# Patient Record
Sex: Female | Born: 1950 | Race: Black or African American | Hispanic: No | Marital: Married | Smoking: Former smoker
Health system: Southern US, Community
[De-identification: ages and names within clinical notes are randomized; demographics above are authoritative.]

## PROBLEM LIST (undated history)

## (undated) DIAGNOSIS — J189 Pneumonia, unspecified organism: Secondary | ICD-10-CM

## (undated) DIAGNOSIS — I209 Angina pectoris, unspecified: Secondary | ICD-10-CM

## (undated) DIAGNOSIS — K831 Obstruction of bile duct: Secondary | ICD-10-CM

## (undated) DIAGNOSIS — R0602 Shortness of breath: Secondary | ICD-10-CM

## (undated) DIAGNOSIS — F32A Depression, unspecified: Secondary | ICD-10-CM

## (undated) DIAGNOSIS — M75100 Unspecified rotator cuff tear or rupture of unspecified shoulder, not specified as traumatic: Secondary | ICD-10-CM

## (undated) DIAGNOSIS — D649 Anemia, unspecified: Secondary | ICD-10-CM

## (undated) DIAGNOSIS — R197 Diarrhea, unspecified: Secondary | ICD-10-CM

## (undated) DIAGNOSIS — I739 Peripheral vascular disease, unspecified: Secondary | ICD-10-CM

## (undated) DIAGNOSIS — M1990 Unspecified osteoarthritis, unspecified site: Secondary | ICD-10-CM

## (undated) DIAGNOSIS — A0472 Enterocolitis due to Clostridium difficile, not specified as recurrent: Secondary | ICD-10-CM

## (undated) DIAGNOSIS — E46 Unspecified protein-calorie malnutrition: Secondary | ICD-10-CM

## (undated) DIAGNOSIS — T4145XA Adverse effect of unspecified anesthetic, initial encounter: Secondary | ICD-10-CM

## (undated) DIAGNOSIS — I10 Essential (primary) hypertension: Secondary | ICD-10-CM

## (undated) DIAGNOSIS — I219 Acute myocardial infarction, unspecified: Secondary | ICD-10-CM

## (undated) DIAGNOSIS — E785 Hyperlipidemia, unspecified: Secondary | ICD-10-CM

## (undated) DIAGNOSIS — R110 Nausea: Secondary | ICD-10-CM

## (undated) DIAGNOSIS — C801 Malignant (primary) neoplasm, unspecified: Secondary | ICD-10-CM

## (undated) DIAGNOSIS — K449 Diaphragmatic hernia without obstruction or gangrene: Secondary | ICD-10-CM

## (undated) DIAGNOSIS — C221 Intrahepatic bile duct carcinoma: Secondary | ICD-10-CM

## (undated) DIAGNOSIS — K222 Esophageal obstruction: Secondary | ICD-10-CM

## (undated) DIAGNOSIS — T8859XA Other complications of anesthesia, initial encounter: Secondary | ICD-10-CM

## (undated) DIAGNOSIS — I499 Cardiac arrhythmia, unspecified: Secondary | ICD-10-CM

## (undated) DIAGNOSIS — C7800 Secondary malignant neoplasm of unspecified lung: Secondary | ICD-10-CM

## (undated) DIAGNOSIS — K219 Gastro-esophageal reflux disease without esophagitis: Secondary | ICD-10-CM

## (undated) DIAGNOSIS — F329 Major depressive disorder, single episode, unspecified: Secondary | ICD-10-CM

## (undated) DIAGNOSIS — K750 Abscess of liver: Secondary | ICD-10-CM

## (undated) DIAGNOSIS — D638 Anemia in other chronic diseases classified elsewhere: Secondary | ICD-10-CM

## (undated) DIAGNOSIS — R51 Headache: Secondary | ICD-10-CM

## (undated) DIAGNOSIS — C240 Malignant neoplasm of extrahepatic bile duct: Secondary | ICD-10-CM

## (undated) HISTORY — PX: LIVER RESECTION: SHX1977

## (undated) HISTORY — PX: ARTHROSCOPY KNEE W/ DRILLING: SUR92

## (undated) HISTORY — DX: Gastro-esophageal reflux disease without esophagitis: K21.9

## (undated) HISTORY — DX: Intrahepatic bile duct carcinoma: C22.1

## (undated) HISTORY — DX: Obstruction of bile duct: K83.1

## (undated) HISTORY — DX: Malignant (primary) neoplasm, unspecified: C80.1

## (undated) HISTORY — DX: Major depressive disorder, single episode, unspecified: F32.9

## (undated) HISTORY — DX: Depression, unspecified: F32.A

## (undated) HISTORY — DX: Unspecified rotator cuff tear or rupture of unspecified shoulder, not specified as traumatic: M75.100

## (undated) HISTORY — DX: Hyperlipidemia, unspecified: E78.5

## (undated) HISTORY — DX: Esophageal obstruction: K22.2

## (undated) HISTORY — DX: Nausea: R11.0

## (undated) HISTORY — PX: OTHER SURGICAL HISTORY: SHX169

## (undated) HISTORY — DX: Diarrhea, unspecified: R19.7

---

## 1998-01-12 ENCOUNTER — Encounter: Admission: RE | Admit: 1998-01-12 | Discharge: 1998-04-12 | Payer: Self-pay | Admitting: Internal Medicine

## 1998-02-28 ENCOUNTER — Emergency Department (HOSPITAL_COMMUNITY): Admission: EM | Admit: 1998-02-28 | Discharge: 1998-02-28 | Payer: Self-pay | Admitting: Emergency Medicine

## 1998-03-02 ENCOUNTER — Emergency Department (HOSPITAL_COMMUNITY): Admission: EM | Admit: 1998-03-02 | Discharge: 1998-03-02 | Payer: Self-pay | Admitting: Internal Medicine

## 1998-06-07 ENCOUNTER — Ambulatory Visit (HOSPITAL_BASED_OUTPATIENT_CLINIC_OR_DEPARTMENT_OTHER): Admission: RE | Admit: 1998-06-07 | Discharge: 1998-06-07 | Payer: Self-pay | Admitting: Orthopedic Surgery

## 1999-05-16 ENCOUNTER — Ambulatory Visit (HOSPITAL_BASED_OUTPATIENT_CLINIC_OR_DEPARTMENT_OTHER): Admission: RE | Admit: 1999-05-16 | Discharge: 1999-05-16 | Payer: Self-pay | Admitting: Orthopedic Surgery

## 2000-02-14 ENCOUNTER — Encounter: Admission: RE | Admit: 2000-02-14 | Discharge: 2000-02-14 | Payer: Self-pay | Admitting: Hematology and Oncology

## 2000-03-12 ENCOUNTER — Ambulatory Visit (HOSPITAL_BASED_OUTPATIENT_CLINIC_OR_DEPARTMENT_OTHER): Admission: RE | Admit: 2000-03-12 | Discharge: 2000-03-12 | Payer: Self-pay | Admitting: Orthopedic Surgery

## 2000-04-17 ENCOUNTER — Ambulatory Visit: Admission: RE | Admit: 2000-04-17 | Discharge: 2000-04-17 | Payer: Self-pay | Admitting: Orthopedic Surgery

## 2000-05-17 ENCOUNTER — Encounter: Payer: Self-pay | Admitting: Emergency Medicine

## 2000-05-18 ENCOUNTER — Inpatient Hospital Stay (HOSPITAL_COMMUNITY): Admission: EM | Admit: 2000-05-18 | Discharge: 2000-05-20 | Payer: Self-pay | Admitting: *Deleted

## 2000-05-18 ENCOUNTER — Encounter: Payer: Self-pay | Admitting: Emergency Medicine

## 2000-05-19 ENCOUNTER — Encounter: Payer: Self-pay | Admitting: Internal Medicine

## 2000-12-23 ENCOUNTER — Encounter: Admission: RE | Admit: 2000-12-23 | Discharge: 2000-12-23 | Payer: Self-pay | Admitting: Hematology and Oncology

## 2001-01-15 ENCOUNTER — Encounter: Admission: RE | Admit: 2001-01-15 | Discharge: 2001-01-15 | Payer: Self-pay | Admitting: Internal Medicine

## 2001-01-18 ENCOUNTER — Ambulatory Visit (HOSPITAL_COMMUNITY): Admission: RE | Admit: 2001-01-18 | Discharge: 2001-01-18 | Payer: Self-pay | Admitting: *Deleted

## 2001-03-02 ENCOUNTER — Encounter: Admission: RE | Admit: 2001-03-02 | Discharge: 2001-03-02 | Payer: Self-pay | Admitting: *Deleted

## 2001-03-24 ENCOUNTER — Encounter (INDEPENDENT_AMBULATORY_CARE_PROVIDER_SITE_OTHER): Payer: Self-pay | Admitting: *Deleted

## 2001-03-24 ENCOUNTER — Encounter: Admission: RE | Admit: 2001-03-24 | Discharge: 2001-03-24 | Payer: Self-pay | Admitting: Internal Medicine

## 2001-06-04 ENCOUNTER — Ambulatory Visit (HOSPITAL_COMMUNITY): Admission: RE | Admit: 2001-06-04 | Discharge: 2001-06-04 | Payer: Self-pay | Admitting: Family Medicine

## 2001-06-04 ENCOUNTER — Encounter: Payer: Self-pay | Admitting: Family Medicine

## 2001-08-12 DIAGNOSIS — K222 Esophageal obstruction: Secondary | ICD-10-CM

## 2001-08-12 HISTORY — DX: Esophageal obstruction: K22.2

## 2001-09-23 ENCOUNTER — Ambulatory Visit (HOSPITAL_COMMUNITY): Admission: RE | Admit: 2001-09-23 | Discharge: 2001-09-23 | Payer: Self-pay | Admitting: Family Medicine

## 2001-12-29 ENCOUNTER — Inpatient Hospital Stay (HOSPITAL_COMMUNITY): Admission: EM | Admit: 2001-12-29 | Discharge: 2002-01-01 | Payer: Self-pay | Admitting: *Deleted

## 2001-12-29 ENCOUNTER — Encounter: Payer: Self-pay | Admitting: *Deleted

## 2001-12-31 ENCOUNTER — Encounter: Payer: Self-pay | Admitting: Cardiology

## 2002-05-21 ENCOUNTER — Encounter: Payer: Self-pay | Admitting: *Deleted

## 2002-05-21 ENCOUNTER — Inpatient Hospital Stay (HOSPITAL_COMMUNITY): Admission: EM | Admit: 2002-05-21 | Discharge: 2002-05-22 | Payer: Self-pay | Admitting: Emergency Medicine

## 2002-07-22 ENCOUNTER — Encounter: Payer: Self-pay | Admitting: Internal Medicine

## 2002-08-02 ENCOUNTER — Encounter: Payer: Self-pay | Admitting: Internal Medicine

## 2002-08-02 ENCOUNTER — Ambulatory Visit (HOSPITAL_COMMUNITY): Admission: RE | Admit: 2002-08-02 | Discharge: 2002-08-02 | Payer: Self-pay | Admitting: Internal Medicine

## 2002-10-05 ENCOUNTER — Ambulatory Visit (HOSPITAL_COMMUNITY): Admission: RE | Admit: 2002-10-05 | Discharge: 2002-10-05 | Payer: Self-pay | Admitting: Family Medicine

## 2003-05-27 ENCOUNTER — Encounter: Payer: Self-pay | Admitting: Internal Medicine

## 2003-05-27 ENCOUNTER — Encounter (INDEPENDENT_AMBULATORY_CARE_PROVIDER_SITE_OTHER): Payer: Self-pay | Admitting: *Deleted

## 2003-05-27 ENCOUNTER — Ambulatory Visit (HOSPITAL_COMMUNITY): Admission: RE | Admit: 2003-05-27 | Discharge: 2003-05-27 | Payer: Self-pay | Admitting: Internal Medicine

## 2003-06-21 ENCOUNTER — Encounter (INDEPENDENT_AMBULATORY_CARE_PROVIDER_SITE_OTHER): Payer: Self-pay

## 2003-06-21 ENCOUNTER — Observation Stay (HOSPITAL_COMMUNITY): Admission: RE | Admit: 2003-06-21 | Discharge: 2003-06-22 | Payer: Self-pay

## 2003-06-21 ENCOUNTER — Encounter (INDEPENDENT_AMBULATORY_CARE_PROVIDER_SITE_OTHER): Payer: Self-pay | Admitting: *Deleted

## 2003-07-30 ENCOUNTER — Encounter: Admission: RE | Admit: 2003-07-30 | Discharge: 2003-07-30 | Payer: Self-pay | Admitting: Orthopedic Surgery

## 2003-11-22 ENCOUNTER — Encounter: Admission: RE | Admit: 2003-11-22 | Discharge: 2003-11-22 | Payer: Self-pay | Admitting: Family Medicine

## 2004-03-23 ENCOUNTER — Emergency Department (HOSPITAL_COMMUNITY): Admission: EM | Admit: 2004-03-23 | Discharge: 2004-03-24 | Payer: Self-pay | Admitting: Emergency Medicine

## 2004-03-24 ENCOUNTER — Encounter (INDEPENDENT_AMBULATORY_CARE_PROVIDER_SITE_OTHER): Payer: Self-pay | Admitting: *Deleted

## 2004-03-27 ENCOUNTER — Ambulatory Visit (HOSPITAL_COMMUNITY): Admission: RE | Admit: 2004-03-27 | Discharge: 2004-03-27 | Payer: Self-pay | Admitting: Nurse Practitioner

## 2004-04-19 ENCOUNTER — Ambulatory Visit: Payer: Self-pay | Admitting: Internal Medicine

## 2004-04-19 ENCOUNTER — Ambulatory Visit: Payer: Self-pay | Admitting: *Deleted

## 2004-05-09 ENCOUNTER — Ambulatory Visit: Payer: Self-pay | Admitting: Nurse Practitioner

## 2004-07-11 ENCOUNTER — Ambulatory Visit: Payer: Self-pay | Admitting: Internal Medicine

## 2004-08-09 ENCOUNTER — Ambulatory Visit: Payer: Self-pay | Admitting: Nurse Practitioner

## 2004-08-09 ENCOUNTER — Emergency Department (HOSPITAL_COMMUNITY): Admission: EM | Admit: 2004-08-09 | Discharge: 2004-08-09 | Payer: Self-pay | Admitting: Emergency Medicine

## 2004-08-09 ENCOUNTER — Encounter (INDEPENDENT_AMBULATORY_CARE_PROVIDER_SITE_OTHER): Payer: Self-pay | Admitting: *Deleted

## 2004-08-16 ENCOUNTER — Ambulatory Visit: Payer: Self-pay | Admitting: Nurse Practitioner

## 2004-11-22 ENCOUNTER — Encounter: Admission: RE | Admit: 2004-11-22 | Discharge: 2004-11-22 | Payer: Self-pay | Admitting: Family Medicine

## 2005-03-19 ENCOUNTER — Ambulatory Visit: Payer: Self-pay | Admitting: Nurse Practitioner

## 2005-03-19 ENCOUNTER — Inpatient Hospital Stay (HOSPITAL_COMMUNITY): Admission: EM | Admit: 2005-03-19 | Discharge: 2005-03-20 | Payer: Self-pay | Admitting: Emergency Medicine

## 2005-03-20 ENCOUNTER — Ambulatory Visit: Payer: Self-pay | Admitting: Internal Medicine

## 2005-04-02 ENCOUNTER — Ambulatory Visit: Payer: Self-pay | Admitting: Internal Medicine

## 2005-04-12 ENCOUNTER — Ambulatory Visit: Payer: Self-pay | Admitting: Internal Medicine

## 2005-04-12 ENCOUNTER — Ambulatory Visit (HOSPITAL_COMMUNITY): Admission: RE | Admit: 2005-04-12 | Discharge: 2005-04-12 | Payer: Self-pay | Admitting: Internal Medicine

## 2005-05-09 ENCOUNTER — Ambulatory Visit: Payer: Self-pay | Admitting: Nurse Practitioner

## 2005-05-23 ENCOUNTER — Ambulatory Visit: Payer: Self-pay | Admitting: Nurse Practitioner

## 2005-05-25 ENCOUNTER — Ambulatory Visit (HOSPITAL_COMMUNITY): Admission: RE | Admit: 2005-05-25 | Discharge: 2005-05-25 | Payer: Self-pay | Admitting: Internal Medicine

## 2005-05-25 ENCOUNTER — Encounter (INDEPENDENT_AMBULATORY_CARE_PROVIDER_SITE_OTHER): Payer: Self-pay | Admitting: *Deleted

## 2005-06-13 ENCOUNTER — Ambulatory Visit: Payer: Self-pay | Admitting: Nurse Practitioner

## 2005-07-02 ENCOUNTER — Ambulatory Visit: Payer: Self-pay | Admitting: Internal Medicine

## 2005-07-18 ENCOUNTER — Ambulatory Visit: Payer: Self-pay | Admitting: Internal Medicine

## 2005-07-18 LAB — HM COLONOSCOPY

## 2005-08-12 HISTORY — PX: CHOLECYSTECTOMY: SHX55

## 2005-10-01 ENCOUNTER — Ambulatory Visit: Payer: Self-pay | Admitting: Internal Medicine

## 2005-10-03 ENCOUNTER — Encounter (INDEPENDENT_AMBULATORY_CARE_PROVIDER_SITE_OTHER): Payer: Self-pay | Admitting: *Deleted

## 2005-10-03 ENCOUNTER — Ambulatory Visit (HOSPITAL_COMMUNITY): Admission: RE | Admit: 2005-10-03 | Discharge: 2005-10-03 | Payer: Self-pay | Admitting: Internal Medicine

## 2005-10-03 ENCOUNTER — Ambulatory Visit: Payer: Self-pay | Admitting: Internal Medicine

## 2005-10-03 ENCOUNTER — Encounter (INDEPENDENT_AMBULATORY_CARE_PROVIDER_SITE_OTHER): Payer: Self-pay | Admitting: Pulmonary Disease

## 2005-10-04 ENCOUNTER — Ambulatory Visit (HOSPITAL_COMMUNITY): Admission: RE | Admit: 2005-10-04 | Discharge: 2005-10-04 | Payer: Self-pay | Admitting: Family Medicine

## 2005-10-16 ENCOUNTER — Ambulatory Visit: Payer: Self-pay | Admitting: Internal Medicine

## 2005-10-16 ENCOUNTER — Ambulatory Visit (HOSPITAL_COMMUNITY): Admission: RE | Admit: 2005-10-16 | Discharge: 2005-10-16 | Payer: Self-pay | Admitting: Internal Medicine

## 2005-10-17 ENCOUNTER — Ambulatory Visit: Payer: Self-pay | Admitting: Internal Medicine

## 2005-11-18 ENCOUNTER — Ambulatory Visit: Payer: Self-pay | Admitting: Hospitalist

## 2006-02-13 ENCOUNTER — Ambulatory Visit (HOSPITAL_COMMUNITY): Admission: RE | Admit: 2006-02-13 | Discharge: 2006-02-13 | Payer: Self-pay | Admitting: Obstetrics and Gynecology

## 2006-03-31 ENCOUNTER — Ambulatory Visit: Payer: Self-pay | Admitting: Hospitalist

## 2006-04-09 ENCOUNTER — Encounter: Admission: RE | Admit: 2006-04-09 | Discharge: 2006-05-07 | Payer: Self-pay | Admitting: Hospitalist

## 2006-05-21 ENCOUNTER — Ambulatory Visit: Payer: Self-pay | Admitting: Internal Medicine

## 2006-05-21 ENCOUNTER — Encounter (INDEPENDENT_AMBULATORY_CARE_PROVIDER_SITE_OTHER): Payer: Self-pay | Admitting: Pulmonary Disease

## 2006-05-21 LAB — CONVERTED CEMR LAB
ALT: 33 units/L (ref 0–40)
Albumin: 4.3 g/dL (ref 3.5–5.2)
Alkaline Phosphatase: 77 units/L (ref 39–117)
BUN: 14 mg/dL (ref 6–23)
CO2: 24 meq/L (ref 19–32)
Calcium: 9.4 mg/dL (ref 8.4–10.5)
Chloride: 106 meq/L (ref 96–112)
Creatinine, Ser: 1.01 mg/dL (ref 0.40–1.20)
Glucose, Bld: 105 mg/dL — ABNORMAL HIGH (ref 70–99)
Potassium: 4.3 meq/L (ref 3.5–5.3)
Sodium: 143 meq/L (ref 135–145)
Total Bilirubin: 0.2 mg/dL — ABNORMAL LOW (ref 0.3–1.2)

## 2006-05-26 ENCOUNTER — Encounter (INDEPENDENT_AMBULATORY_CARE_PROVIDER_SITE_OTHER): Payer: Self-pay | Admitting: Pulmonary Disease

## 2006-05-26 DIAGNOSIS — K5730 Diverticulosis of large intestine without perforation or abscess without bleeding: Secondary | ICD-10-CM | POA: Insufficient documentation

## 2006-05-26 DIAGNOSIS — N83209 Unspecified ovarian cyst, unspecified side: Secondary | ICD-10-CM

## 2006-05-26 DIAGNOSIS — K219 Gastro-esophageal reflux disease without esophagitis: Secondary | ICD-10-CM

## 2006-05-26 DIAGNOSIS — K648 Other hemorrhoids: Secondary | ICD-10-CM | POA: Insufficient documentation

## 2006-05-26 DIAGNOSIS — K449 Diaphragmatic hernia without obstruction or gangrene: Secondary | ICD-10-CM | POA: Insufficient documentation

## 2006-05-26 DIAGNOSIS — E785 Hyperlipidemia, unspecified: Secondary | ICD-10-CM

## 2006-05-26 DIAGNOSIS — I10 Essential (primary) hypertension: Secondary | ICD-10-CM | POA: Insufficient documentation

## 2006-05-26 DIAGNOSIS — K222 Esophageal obstruction: Secondary | ICD-10-CM | POA: Insufficient documentation

## 2006-05-26 DIAGNOSIS — R1084 Generalized abdominal pain: Secondary | ICD-10-CM | POA: Insufficient documentation

## 2006-06-04 ENCOUNTER — Ambulatory Visit: Payer: Self-pay | Admitting: Internal Medicine

## 2006-06-04 ENCOUNTER — Encounter (INDEPENDENT_AMBULATORY_CARE_PROVIDER_SITE_OTHER): Payer: Self-pay | Admitting: Pulmonary Disease

## 2006-06-04 LAB — CONVERTED CEMR LAB
ALT: 33 units/L (ref 0–40)
AST: 28 units/L (ref 0–37)
Albumin: 4.1 g/dL (ref 3.5–5.2)
Alkaline Phosphatase: 69 units/L (ref 39–117)
BUN: 12 mg/dL (ref 6–23)
CO2: 25 meq/L (ref 19–32)
Calcium: 9.5 mg/dL (ref 8.4–10.5)
Chloride: 102 meq/L (ref 96–112)
Creatinine, Ser: 0.87 mg/dL (ref 0.40–1.20)
Glucose, Bld: 110 mg/dL — ABNORMAL HIGH (ref 70–99)
HCT: 40.9 % (ref 36.0–46.0)
Hemoglobin: 13.3 g/dL (ref 12.0–15.0)
Leukocyte count, blood: 4.3 10*9/L (ref 4.0–10.5)
MCHC: 32.5 g/dL (ref 30.0–36.0)
MCV: 86.8 fL (ref 78.0–100.0)
Platelets: 249 10*3/uL (ref 150–400)
Potassium: 3.6 meq/L (ref 3.5–5.3)
RBC: 4.71 M/uL (ref 3.87–5.11)
RDW: 13.5 % (ref 11.5–14.0)
Sodium: 139 meq/L (ref 135–145)
TSH: 1.491 microintl units/mL (ref 0.350–5.50)
Total Bilirubin: 0.3 mg/dL (ref 0.3–1.2)
Total CK: 46 units/L (ref 7–177)
Total Protein: 6.8 g/dL (ref 6.0–8.3)

## 2006-06-11 ENCOUNTER — Ambulatory Visit: Payer: Self-pay | Admitting: Internal Medicine

## 2006-06-11 ENCOUNTER — Encounter: Admission: RE | Admit: 2006-06-11 | Discharge: 2006-06-11 | Payer: Self-pay | Admitting: Internal Medicine

## 2006-07-08 ENCOUNTER — Ambulatory Visit: Payer: Self-pay | Admitting: Cardiology

## 2006-07-12 DIAGNOSIS — I7389 Other specified peripheral vascular diseases: Secondary | ICD-10-CM

## 2006-07-17 ENCOUNTER — Ambulatory Visit: Payer: Self-pay

## 2006-09-08 ENCOUNTER — Ambulatory Visit: Payer: Self-pay | Admitting: Hospitalist

## 2006-09-08 ENCOUNTER — Ambulatory Visit: Payer: Self-pay | Admitting: Cardiovascular Disease

## 2006-09-08 DIAGNOSIS — F329 Major depressive disorder, single episode, unspecified: Secondary | ICD-10-CM

## 2006-09-08 DIAGNOSIS — M129 Arthropathy, unspecified: Secondary | ICD-10-CM | POA: Insufficient documentation

## 2006-09-12 ENCOUNTER — Telehealth (INDEPENDENT_AMBULATORY_CARE_PROVIDER_SITE_OTHER): Payer: Self-pay | Admitting: *Deleted

## 2006-12-19 ENCOUNTER — Ambulatory Visit (HOSPITAL_COMMUNITY): Admission: RE | Admit: 2006-12-19 | Discharge: 2006-12-19 | Payer: Self-pay | Admitting: Internal Medicine

## 2006-12-19 ENCOUNTER — Ambulatory Visit: Payer: Self-pay | Admitting: *Deleted

## 2006-12-19 DIAGNOSIS — M79609 Pain in unspecified limb: Secondary | ICD-10-CM | POA: Insufficient documentation

## 2006-12-26 ENCOUNTER — Ambulatory Visit (HOSPITAL_COMMUNITY): Admission: RE | Admit: 2006-12-26 | Discharge: 2006-12-26 | Payer: Self-pay | Admitting: Hospitalist

## 2006-12-26 ENCOUNTER — Ambulatory Visit: Payer: Self-pay | Admitting: Hospitalist

## 2007-01-09 ENCOUNTER — Encounter (INDEPENDENT_AMBULATORY_CARE_PROVIDER_SITE_OTHER): Payer: Self-pay | Admitting: Pulmonary Disease

## 2007-01-09 ENCOUNTER — Ambulatory Visit: Payer: Self-pay | Admitting: Internal Medicine

## 2007-01-09 LAB — CONVERTED CEMR LAB
ALT: 21 units/L (ref 0–35)
AST: 20 units/L (ref 0–37)
Albumin: 4.1 g/dL (ref 3.5–5.2)
Alkaline Phosphatase: 73 units/L (ref 39–117)
CO2: 25 meq/L (ref 19–32)
Calcium: 9.6 mg/dL (ref 8.4–10.5)
Platelets: 314 10*3/uL (ref 150–400)
Total CK: 53 units/L (ref 7–177)
Total Protein: 7 g/dL (ref 6.0–8.3)
WBC: 6.1 10*3/uL (ref 4.0–10.5)

## 2007-01-16 ENCOUNTER — Ambulatory Visit (HOSPITAL_COMMUNITY): Admission: RE | Admit: 2007-01-16 | Discharge: 2007-01-16 | Payer: Self-pay | Admitting: Pulmonary Disease

## 2007-01-23 ENCOUNTER — Ambulatory Visit (HOSPITAL_COMMUNITY): Admission: RE | Admit: 2007-01-23 | Discharge: 2007-01-23 | Payer: Self-pay | Admitting: Internal Medicine

## 2007-01-23 ENCOUNTER — Ambulatory Visit: Payer: Self-pay | Admitting: Internal Medicine

## 2007-02-18 ENCOUNTER — Encounter
Admission: RE | Admit: 2007-02-18 | Discharge: 2007-02-20 | Payer: Self-pay | Admitting: Physical Medicine & Rehabilitation

## 2007-02-20 ENCOUNTER — Ambulatory Visit: Payer: Self-pay | Admitting: Physical Medicine & Rehabilitation

## 2007-02-23 ENCOUNTER — Encounter (INDEPENDENT_AMBULATORY_CARE_PROVIDER_SITE_OTHER): Payer: Self-pay | Admitting: *Deleted

## 2007-03-02 ENCOUNTER — Encounter (INDEPENDENT_AMBULATORY_CARE_PROVIDER_SITE_OTHER): Payer: Self-pay | Admitting: *Deleted

## 2007-03-26 ENCOUNTER — Ambulatory Visit: Payer: Self-pay | Admitting: Cardiovascular Disease

## 2007-03-26 ENCOUNTER — Encounter (INDEPENDENT_AMBULATORY_CARE_PROVIDER_SITE_OTHER): Payer: Self-pay | Admitting: *Deleted

## 2007-04-12 ENCOUNTER — Encounter (INDEPENDENT_AMBULATORY_CARE_PROVIDER_SITE_OTHER): Payer: Self-pay | Admitting: *Deleted

## 2007-04-13 HISTORY — PX: OTHER SURGICAL HISTORY: SHX169

## 2007-04-24 ENCOUNTER — Ambulatory Visit: Payer: Self-pay | Admitting: Cardiovascular Disease

## 2007-04-24 ENCOUNTER — Ambulatory Visit (HOSPITAL_COMMUNITY): Admission: RE | Admit: 2007-04-24 | Discharge: 2007-04-24 | Payer: Self-pay | Admitting: Cardiovascular Disease

## 2007-05-28 ENCOUNTER — Telehealth (INDEPENDENT_AMBULATORY_CARE_PROVIDER_SITE_OTHER): Payer: Self-pay | Admitting: *Deleted

## 2007-06-01 ENCOUNTER — Observation Stay (HOSPITAL_COMMUNITY): Admission: EM | Admit: 2007-06-01 | Discharge: 2007-06-04 | Payer: Self-pay | Admitting: Emergency Medicine

## 2007-06-01 ENCOUNTER — Ambulatory Visit: Payer: Self-pay | Admitting: Internal Medicine

## 2007-06-16 ENCOUNTER — Ambulatory Visit: Payer: Self-pay | Admitting: Hospitalist

## 2007-06-16 ENCOUNTER — Encounter (INDEPENDENT_AMBULATORY_CARE_PROVIDER_SITE_OTHER): Payer: Self-pay | Admitting: Internal Medicine

## 2007-06-16 DIAGNOSIS — M542 Cervicalgia: Secondary | ICD-10-CM

## 2007-06-16 LAB — CONVERTED CEMR LAB
Potassium: 3.9 meq/L (ref 3.5–5.3)
Sodium: 140 meq/L (ref 135–145)

## 2007-06-18 ENCOUNTER — Encounter: Admission: RE | Admit: 2007-06-18 | Discharge: 2007-08-04 | Payer: Self-pay | Admitting: Internal Medicine

## 2007-06-25 ENCOUNTER — Encounter: Admission: RE | Admit: 2007-06-25 | Discharge: 2007-08-12 | Payer: Self-pay | Admitting: Pediatrics

## 2007-07-22 ENCOUNTER — Ambulatory Visit: Payer: Self-pay | Admitting: Cardiovascular Disease

## 2007-07-22 ENCOUNTER — Ambulatory Visit: Payer: Self-pay

## 2007-08-04 ENCOUNTER — Ambulatory Visit: Payer: Self-pay | Admitting: Vascular Surgery

## 2007-08-13 HISTORY — PX: BYPASS GRAFT: SHX909

## 2007-08-20 ENCOUNTER — Inpatient Hospital Stay (HOSPITAL_COMMUNITY): Admission: RE | Admit: 2007-08-20 | Discharge: 2007-08-25 | Payer: Self-pay | Admitting: Vascular Surgery

## 2007-08-20 ENCOUNTER — Ambulatory Visit: Payer: Self-pay | Admitting: Vascular Surgery

## 2007-08-21 ENCOUNTER — Encounter: Payer: Self-pay | Admitting: Vascular Surgery

## 2007-09-01 ENCOUNTER — Inpatient Hospital Stay (HOSPITAL_COMMUNITY): Admission: AD | Admit: 2007-09-01 | Discharge: 2007-09-08 | Payer: Self-pay | Admitting: Vascular Surgery

## 2007-09-01 ENCOUNTER — Ambulatory Visit: Payer: Self-pay | Admitting: Vascular Surgery

## 2007-09-15 ENCOUNTER — Ambulatory Visit: Payer: Self-pay | Admitting: Vascular Surgery

## 2007-11-12 ENCOUNTER — Encounter (INDEPENDENT_AMBULATORY_CARE_PROVIDER_SITE_OTHER): Payer: Self-pay | Admitting: *Deleted

## 2007-11-12 ENCOUNTER — Ambulatory Visit: Payer: Self-pay | Admitting: Hospitalist

## 2007-11-12 DIAGNOSIS — D4959 Neoplasm of unspecified behavior of other genitourinary organ: Secondary | ICD-10-CM

## 2007-11-17 ENCOUNTER — Telehealth: Payer: Self-pay | Admitting: *Deleted

## 2008-04-15 ENCOUNTER — Telehealth (INDEPENDENT_AMBULATORY_CARE_PROVIDER_SITE_OTHER): Payer: Self-pay | Admitting: *Deleted

## 2008-05-10 ENCOUNTER — Ambulatory Visit: Payer: Self-pay | Admitting: Vascular Surgery

## 2008-06-20 ENCOUNTER — Telehealth (INDEPENDENT_AMBULATORY_CARE_PROVIDER_SITE_OTHER): Payer: Self-pay | Admitting: *Deleted

## 2008-08-12 DIAGNOSIS — M75100 Unspecified rotator cuff tear or rupture of unspecified shoulder, not specified as traumatic: Secondary | ICD-10-CM

## 2008-08-12 HISTORY — DX: Unspecified rotator cuff tear or rupture of unspecified shoulder, not specified as traumatic: M75.100

## 2008-09-26 ENCOUNTER — Ambulatory Visit: Payer: Self-pay | Admitting: *Deleted

## 2008-09-26 ENCOUNTER — Ambulatory Visit (HOSPITAL_COMMUNITY): Admission: RE | Admit: 2008-09-26 | Discharge: 2008-09-26 | Payer: Self-pay | Admitting: Internal Medicine

## 2008-09-26 DIAGNOSIS — M25519 Pain in unspecified shoulder: Secondary | ICD-10-CM

## 2008-09-27 ENCOUNTER — Encounter (INDEPENDENT_AMBULATORY_CARE_PROVIDER_SITE_OTHER): Payer: Self-pay | Admitting: *Deleted

## 2008-10-13 ENCOUNTER — Encounter (INDEPENDENT_AMBULATORY_CARE_PROVIDER_SITE_OTHER): Payer: Self-pay | Admitting: Internal Medicine

## 2008-10-13 ENCOUNTER — Ambulatory Visit: Payer: Self-pay | Admitting: *Deleted

## 2008-10-16 ENCOUNTER — Ambulatory Visit (HOSPITAL_COMMUNITY): Admission: RE | Admit: 2008-10-16 | Discharge: 2008-10-16 | Payer: Self-pay | Admitting: Internal Medicine

## 2008-10-17 ENCOUNTER — Ambulatory Visit: Payer: Self-pay | Admitting: Sports Medicine

## 2008-10-17 DIAGNOSIS — M7520 Bicipital tendinitis, unspecified shoulder: Secondary | ICD-10-CM | POA: Insufficient documentation

## 2008-10-18 ENCOUNTER — Encounter: Payer: Self-pay | Admitting: Family Medicine

## 2008-10-20 DIAGNOSIS — M67919 Unspecified disorder of synovium and tendon, unspecified shoulder: Secondary | ICD-10-CM | POA: Insufficient documentation

## 2008-10-20 DIAGNOSIS — M719 Bursopathy, unspecified: Secondary | ICD-10-CM

## 2008-10-24 ENCOUNTER — Telehealth (INDEPENDENT_AMBULATORY_CARE_PROVIDER_SITE_OTHER): Payer: Self-pay | Admitting: *Deleted

## 2008-10-31 ENCOUNTER — Telehealth: Payer: Self-pay | Admitting: *Deleted

## 2009-02-28 ENCOUNTER — Ambulatory Visit: Payer: Self-pay | Admitting: Vascular Surgery

## 2009-08-31 ENCOUNTER — Ambulatory Visit: Payer: Self-pay | Admitting: Vascular Surgery

## 2009-10-19 ENCOUNTER — Ambulatory Visit (HOSPITAL_COMMUNITY): Admission: RE | Admit: 2009-10-19 | Discharge: 2009-10-19 | Payer: Self-pay | Admitting: Infectious Diseases

## 2009-10-19 ENCOUNTER — Telehealth (INDEPENDENT_AMBULATORY_CARE_PROVIDER_SITE_OTHER): Payer: Self-pay | Admitting: Internal Medicine

## 2009-10-19 ENCOUNTER — Ambulatory Visit: Payer: Self-pay | Admitting: Internal Medicine

## 2009-10-19 DIAGNOSIS — R05 Cough: Secondary | ICD-10-CM

## 2009-10-19 DIAGNOSIS — R059 Cough, unspecified: Secondary | ICD-10-CM | POA: Insufficient documentation

## 2010-03-08 ENCOUNTER — Ambulatory Visit: Payer: Self-pay | Admitting: Vascular Surgery

## 2010-03-12 HISTORY — PX: CARDIAC CATHETERIZATION: SHX172

## 2010-03-12 HISTORY — PX: OTHER SURGICAL HISTORY: SHX169

## 2010-03-19 ENCOUNTER — Inpatient Hospital Stay (HOSPITAL_COMMUNITY): Admission: RE | Admit: 2010-03-19 | Discharge: 2010-03-20 | Payer: Self-pay | Admitting: Vascular Surgery

## 2010-03-19 ENCOUNTER — Ambulatory Visit: Payer: Self-pay | Admitting: Cardiology

## 2010-03-19 ENCOUNTER — Ambulatory Visit: Payer: Self-pay | Admitting: Vascular Surgery

## 2010-03-29 ENCOUNTER — Ambulatory Visit: Payer: Self-pay | Admitting: Internal Medicine

## 2010-04-06 ENCOUNTER — Ambulatory Visit: Payer: Self-pay | Admitting: Internal Medicine

## 2010-04-12 LAB — CONVERTED CEMR LAB
ALT: 44 units/L — ABNORMAL HIGH (ref 0–35)
AST: 40 units/L — ABNORMAL HIGH (ref 0–37)
Alkaline Phosphatase: 230 units/L — ABNORMAL HIGH (ref 39–117)
Bilirubin, Direct: 0.1 mg/dL (ref 0.0–0.3)
Total Bilirubin: 0.5 mg/dL (ref 0.3–1.2)
Total CHOL/HDL Ratio: 4.1
Total Protein: 6.9 g/dL (ref 6.0–8.3)

## 2010-04-20 ENCOUNTER — Ambulatory Visit: Payer: Self-pay | Admitting: Gastroenterology

## 2010-04-20 ENCOUNTER — Telehealth: Payer: Self-pay | Admitting: Internal Medicine

## 2010-04-20 DIAGNOSIS — R1013 Epigastric pain: Secondary | ICD-10-CM

## 2010-04-23 ENCOUNTER — Ambulatory Visit: Payer: Self-pay | Admitting: Internal Medicine

## 2010-04-23 LAB — CONVERTED CEMR LAB
Albumin: 4.1 g/dL (ref 3.5–5.2)
Alkaline Phosphatase: 230 units/L — ABNORMAL HIGH (ref 39–117)
BUN: 13 mg/dL (ref 6–23)
Basophils Absolute: 0 10*3/uL (ref 0.0–0.1)
Basophils Relative: 0.3 % (ref 0.0–3.0)
Bilirubin, Direct: 0.2 mg/dL (ref 0.0–0.3)
Chloride: 104 meq/L (ref 96–112)
GFR calc non Af Amer: 100.38 mL/min (ref >60)
HCT: 38 % (ref 36.0–46.0)
Hemoglobin: 13 g/dL (ref 12.0–15.0)
Lymphocytes Relative: 28.8 % (ref 12.0–46.0)
Lymphs Abs: 1.9 10*3/uL (ref 0.7–4.0)
MCHC: 34.3 g/dL (ref 30.0–36.0)
MCV: 87.1 fL (ref 78.0–100.0)
Monocytes Relative: 7 % (ref 3.0–12.0)
Neutro Abs: 4.2 10*3/uL (ref 1.4–7.7)
RDW: 13.8 % (ref 11.5–14.6)
Total Bilirubin: 0.7 mg/dL (ref 0.3–1.2)
Total Protein: 7.2 g/dL (ref 6.0–8.3)
WBC: 6.7 10*3/uL (ref 4.5–10.5)

## 2010-04-25 ENCOUNTER — Ambulatory Visit (HOSPITAL_COMMUNITY): Admission: RE | Admit: 2010-04-25 | Discharge: 2010-04-25 | Payer: Self-pay | Admitting: Internal Medicine

## 2010-04-27 ENCOUNTER — Telehealth: Payer: Self-pay | Admitting: Internal Medicine

## 2010-04-30 ENCOUNTER — Telehealth: Payer: Self-pay | Admitting: Gastroenterology

## 2010-05-16 ENCOUNTER — Ambulatory Visit: Payer: Self-pay | Admitting: Vascular Surgery

## 2010-06-12 ENCOUNTER — Encounter: Payer: Self-pay | Admitting: Internal Medicine

## 2010-06-22 ENCOUNTER — Ambulatory Visit: Payer: Self-pay | Admitting: Internal Medicine

## 2010-06-27 ENCOUNTER — Ambulatory Visit: Payer: Self-pay | Admitting: Internal Medicine

## 2010-09-02 ENCOUNTER — Encounter: Payer: Self-pay | Admitting: Pulmonary Disease

## 2010-09-02 ENCOUNTER — Encounter: Payer: Self-pay | Admitting: Family Medicine

## 2010-09-02 ENCOUNTER — Encounter: Payer: Self-pay | Admitting: Obstetrics and Gynecology

## 2010-09-12 DIAGNOSIS — C801 Malignant (primary) neoplasm, unspecified: Secondary | ICD-10-CM

## 2010-09-12 HISTORY — DX: Malignant (primary) neoplasm, unspecified: C80.1

## 2010-09-13 NOTE — Procedures (Signed)
Summary: EGD report   EGD  Procedure date:  04/12/2005  Findings:      Location: [facility]    Patient Name: Britanny, Marksberry MRN: [id number] Procedure Procedures: Panendoscopy (EGD) CPT: 43235.    with esophageal dilation. CPT: G9296129.  Personnel: Endoscopist: [name]. Marina Goodell, MD.  Exam Location: Exam performed in Endoscopy Suite.  Patient Consent: Procedure, Alternatives, Risks and Benefits discussed, consent obtained,  Indications Symptoms: Dysphagia. Chest Pain. Reflux symptoms  History  Current Medications: Patient is not currently taking Coumadin.  Pre-Exam Physical: Performed Apr 12, 2005  Entire physical exam was normal.  Exam Exam Info: Maximum depth of insertion Duodenum, intended Duodenum. Patient position: on left side. Vocal cords visualized. Gastric retroflexion performed. Images taken. ASA Classification: II. Tolerance: excellent.  Sedation Meds: Demerol 70 mg. given IV. Versed 6 mg. given IV.  Monitoring: BP and pulse monitoring done. Oximetry used. Supplemental O2 given  Fluoroscopy: Fluoroscopy was used.  Findings HIATAL HERNIA:  STRICTURE / STENOSIS: Stricture in Distal Esophagus.  Constriction: partial. Etiology: benign due to reflux. 35 cm from mouth. Lumen diameter is 15 mm. ICD9: Esophageal Stricture: 530.3. Comment: No inflammation present.  - Dilation: Cardia. Procedure was performed under Fluoroscopy. Wire Guided/Savary (Wilson-Cook) dilator used, Diameter: 18 mm, No Resistance, No Heme present on extraction. 1  total dilators used. Patient tolerance excellent.    Comments: Otherwise normal EGD to D3 Assessment  Diagnoses: 530.3: Esophageal Stricture.  553.3: Hernia, Hiatal.  530.81: GERD.   Comments: MUSCULOSKELETAL CHEST WALL PAIN Events  Unplanned Intervention: No unplanned interventions were required.  Unplanned Events: There were no complications. Plans Instructions: Nothing to eat or drink for 2 HRS.  Clear  or full liquids: 2 HRS. Resume previous diet: AM.  Medication(s): PPI: Esomeprazole/Nexium 40 mg BID, starting Apr 12, 2005   Disposition: After procedure patient sent to recovery. After recovery patient sent home.  Comments: RETURN TO THE CARE OF DR Emeline Darling TO MANAGE CHEST WALL PAIN  This report was created from the original endoscopy report, which was reviewed and signed by the above listed endoscopist.

## 2010-09-13 NOTE — Progress Notes (Signed)
Summary: triage  Phone Note Call from Patient Call back at Home Phone [phone]   Caller: Patient Call For: Dr. Marina Goodell Reason for Call: Talk to Nurse Summary of Call: pt complaining of severe abd pain and would like to be seen asap Initial call taken by: Vallarie Mare,  April 20, 2010 9:19 AM  Follow-up for Phone Call         Pt. started having upper abd. pain on Sunday with nausea but no vomiting.Says she breaks out in a sweat at times but hasn't taken her temp.She put herself on a full liquid dietbut after oral intake pain level becomes a 10.Could not come in this am to see PA because her daughter will have to leave work and bring her.Says she has had her gall bladder out. Given appt. with DrJacobs for this afternoon. Follow-up by: Teryl Lucy RN,  April 20, 2010 9:43 AM  Additional Follow-up for Phone Call Additional follow up Details #1::        Pt. discussed with Dr.Jacobs and he ordered cbcd and c-met prior to visit.Pt contacted and she will come early for labs. Additional Follow-up by: Teryl Lucy RN,  April 20, 2010 9:50 AM

## 2010-09-13 NOTE — Letter (Signed)
Summary: EGD Instructions  [facility]  [address] [place], [place] [address]   Phone: [phone]  Fax: [phone]       ALYX MCGUIRK    11-30-1950    MRN: [id number]       Procedure Day /Date:WEDNESDAY 06/27/10     Arrival Time: 3:00 PM     Procedure Time:4:00 PM     Location of Procedure:                    [facility] (4th Floor)   PREPARATION FOR ENDOSCOPY   On 06/27/10 THE DAY OF THE PROCEDURE:  1.   No solid foods, milk or milk products are allowed after midnight the night before your procedure.  2.   Do not drink anything colored red or purple.  Avoid juices with pulp.  No orange juice.  3.  You may drink clear liquids until 2:00 PM, which is 2 hours before your procedure.                                                                                                CLEAR LIQUIDS INCLUDE: Water Jello Ice Popsicles Tea (sugar ok, no milk/cream) Powdered fruit flavored drinks Coffee (sugar ok, no milk/cream) Gatorade Juice: apple, white grape, white cranberry  Lemonade Clear bullion, consomm, broth Carbonated beverages (any kind) Strained chicken noodle soup Hard Candy   MEDICATION INSTRUCTIONS  Unless otherwise instructed, you should take regular prescription medications with a small sip of water as early as possible the morning of your procedure.               OTHER INSTRUCTIONS  You will need a responsible adult at least 59 years of age to accompany you and drive you home.   This person must remain in the waiting room during your procedure.  Wear loose fitting clothing that is easily removed.  Leave jewelry and other valuables at home.  However, you may wish to bring a book to read or an iPod/MP3 player to listen to music as you wait for your procedure to start.  Remove all body piercing jewelry and leave at home.  Total time from sign-in until discharge is approximately 2-3 hours.  You should go home  directly after your procedure and rest.  You can resume normal activities the day after your procedure.  The day of your procedure you should not:   Drive   Make legal decisions   Operate machinery   Drink alcohol   Return to work  You will receive specific instructions about eating, activities and medications before you leave.    The above instructions have been reviewed and explained to me by   _______________________    I fully understand and can verbalize these instructions _____________________________ Date _________

## 2010-09-13 NOTE — Letter (Signed)
Summary: Colonoscopy Letter  [facility]  [address] [place], [place] [address]   Phone: [phone]  Fax: [phone]      June 12, 2010 MRN: [id number]   [name] [address] [place], [place]  [address]   Dear [name],   According to your medical record, it is time for you to schedule a Colonoscopy. The American Cancer Society recommends this procedure as a method to detect early colon cancer. Patients with a family history of colon cancer, or a personal history of colon polyps or inflammatory bowel disease are at increased risk.  This letter has been generated based on the recommendations made at the time of your procedure. If you feel that in your particular situation this may no longer apply, please contact our office.  Please call our office at [phone] to schedule this appointment or to update your records at your earliest convenience.  Thank you for cooperating with [place] to provide you with the very best care possible.   Sincerely,  [name]. Marina Goodell, M.D.  [facility] [phone]

## 2010-09-13 NOTE — Op Note (Signed)
Summary: Operative Report    NAME:  [name], [name]                           ACCOUNT NO.:  [id number]   MEDICAL RECORD NO.:  [id number]                   PATIENT TYPE:  AMB   LOCATION:  DAY                                  FACILITY:  [facility]   PHYSICIAN:  Lorre Munroe., M.D.            DATE OF BIRTH:  Jun 02, 1951   DATE OF PROCEDURE:  06/21/2003  DATE OF DISCHARGE:                                 OPERATIVE REPORT   PREOPERATIVE DIAGNOSIS:  Symptomatic gallstones.   POSTOPERATIVE DIAGNOSIS:  Symptomatic gallstones.   OPERATION:  Laparoscopic cholecystectomy.   SURGEON:  Lebron Conners, M.D.   ASSISTANT:  Gita Kudo, M.D.   ANESTHESIA:  General.   DESCRIPTION OF PROCEDURE:  After the patient was monitored and anesthetized  and had routine preparation and draping of the abdomen, I liberally infused  local anesthetic just below the umbilicus and subsequently at three  additional port sites.  I then made a transverse incision just below the  umbilicus, dissected down to the fascia, opened it in the midline, and  bluntly opened the peritoneum.  After putting a 0 Vicryl pursestring suture  in the fascia and securing a Hasson cannula, I inflated the abdomen with CO2  and examined the contents.  There were some filmy adhesions on the dome of  the liver to the diaphragm and anterior abdominal wall, and the gallbladder  appeared moderately inflamed.  No other abnormalities were seen.  After  placement of three additional ports and taking down of the adhesions over  the dome of the liver, I retracted the fundus of the gallbladder toward the  right shoulder and took down the adhesions to its undersurface.  The  gallbladder was completely filled with hard gallstones and was contracted.  It made dissection a little bit difficult, but we were able to grasp it and  pull it out laterally and create a nice window between the gallbladder and  the liver, and I saw the cystic artery  traversing the window and clipped and  divided it.  I then dissected further until I clearly saw the infundibulum  of the gallbladder and the cystic duct emerging from it.  The cystic duct  was very small.  I clipped the cystic duct with four clips and cut between  the two closest to the gallbladder.  I then dissected the gallbladder from  the liver, utilizing the hook and spatula cautery instruments, and I found  one other small artery and clipped that as well.  Hemostasis was excellent  as well.  I accidentally made one small hole in the gallbladder and a couple  of stones came out, and I removed those.  After detaching the gallbladder  from the liver, I placed it in a plastic pouch and put it above the liver.  I then got good hemostasis in the gallbladder  fossa and copiously irrigated  the fossa  and removed most of the irrigant.  I then removed the gallbladder  in its pouch from the body through the umbilical incision and tied the  pursestring suture.  I removed the remaining irrigant.  Sponge, needle, and  instrument counts were correct.  I removed the lateral ports under direct  vision and saw no bleeding from the abdominal wall.  After allowing the CO2  to escape, I removed the epigastric port.  I closed all skin incisions with  intracuticular 4-0 Vicryl and Steri-Strips.  The patient tolerated the  operation well.                                              Lorre Munroe., M.D.   WB/MEDQ  D:  06/21/2003  T:  06/21/2003  Job:  [id number]

## 2010-09-13 NOTE — Procedures (Signed)
Summary: Upper Endoscopy  Patient: [name] Note: All result statuses are Final unless otherwise noted.  Tests: (1) Upper Endoscopy (EGD)   EGD Upper Endoscopy       DONE     [facility]     [address]     [place], [place]  [address]           ENDOSCOPY PROCEDURE REPORT           PATIENT:  Sherry, [name]  MR#:  [id number]     BIRTHDATE:  March 13, 1951, 59 yrs. old  GENDER:  female           ENDOSCOPIST:  [name]. Eda Keys, MD     Referred by:  Office           PROCEDURE DATE:  06/27/2010     PROCEDURE:  EGD, diagnostic 19147     ASA CLASS:  Class II     INDICATIONS:  abdominal pain           MEDICATIONS:   Fentanyl 50 mcg IV, Versed 5 mg IV     TOPICAL ANESTHETIC:  Exactacain Spray           DESCRIPTION OF PROCEDURE:   After the risks benefits and     alternatives of the procedure were thoroughly explained, informed     consent was obtained.  The [facility] [id number] endoscope was     introduced through the mouth and advanced to the second portion of     the duodenum, without limitations.  The instrument was slowly     withdrawn as the mucosa was fully examined.     <<PROCEDUREIMAGES>>           The upper, middle, and distal third of the esophagus were     carefully inspected and no abnormalities were noted. The z-line     was well seen at the GEJ. The endoscope was pushed into the fundus     which was normal including a retroflexed view. The antrum,gastric     body, first and second part of the duodenum were unremarkable.     Retroflexed views revealed a hiatal hernia.    The scope was then     withdrawn from the patient and the procedure completed.           COMPLICATIONS:  None           ENDOSCOPIC IMPRESSION:     1) Normal EGD     2) A hiatal hernia     3) Gerd           RECOMMENDATIONS:     1) continue PPI     2) My office will schedule a colonoscopy           ______________________________     [name]. Eda Keys, MD           CC:  Gearlean Alf MD, The Patient           n.     eSIGNEDWilhemina Bonito. Eda Keys at 06/27/2010 04:56 PM           [name], [phone]  Note: An exclamation mark (!) indicates a result that was not dispersed into the flowsheet. Document Creation Date: 06/27/2010 4:56 PM _______________________________________________________________________  (1) Order result status: Final Collection or observation date-time: 06/27/2010 16:53 Requested date-time:  Receipt date-time:  Reported date-time:  Referring Physician:   Ordering Physician: Fransico Setters [phone]) Specimen  Source:  Source: Launa Grill Order Number: [id number] Lab site:

## 2010-09-13 NOTE — Progress Notes (Signed)
Summary: Test results  Phone Note Call from Patient   Caller: Patient Call For: Laren Everts MD Summary of Call: RTC to pt.  Pt said that she had some test that were ordered by her GI doctor. York Spaniel that she was informed that her primary doctor would have the results and discuss them with her.  Pt was informed that since the testing was done on yesterday that we do not have the results and that she will need to get in touch with the ordering physicaian to get her results.  Pt said that she will get in touch with the docttor thatt ordered the test . Initial call taken by: Angelina Ok RN,  April 27, 2010 10:15 AM    Agree with above. Will get results of tests for our records.

## 2010-09-13 NOTE — Assessment & Plan Note (Signed)
Summary: est-ck/fu/meds/cfb   Vital Signs:  Patient profile:   60 year old female Height:      62 inches (157.48 cm) Weight:      204.01 pounds (92.73 kg) BMI:     37.45 Temp:     98.5 degrees F (36.94 degrees C) oral Pulse rate:   64 / minute BP sitting:   147 / 92  (right arm)  Vitals Entered By: Angelina Ok RN (March 29, 2010 3:39 PM) CC: Depression Is Patient Diabetic? No Pain Assessment Patient in pain? yes     Location: knee, back, shoulders Intensity: 10 Type: aching, throbbing. Nutritional Status BMI of > 30 = obese  Have you ever been in a relationship where you felt threatened, hurt or afraid?No   Does patient need assistance? Functional Status Self care Ambulation Impaired:Risk for fall Comments Has seen the Vascular doctor.  Has 2 more blockages.  Has to have surgery.  Knot on right arm.  Arthrits in both knees.  Uses a cane to ambulate.  Check up.   Primary Care Provider:  Laren Everts MD  CC:  Depression.  History of Present Illness: 60 yr old woman with pmhx as described below comes to the clinic for follow up. Patient recently was discharge from the hospital for aortogram and cath. Cath did not show any obstructive disease. Patient reports to have lower extremity pain. Patient is to see [name] on April 19, 2010. Reports that she may need surgery for PVD.   Patient reports that she feels a knot on the area of right shoulder which she has felt for 3 months.   She has not done her mammogram.    Depression History:      The patient denies a depressed mood most of the day and a diminished interest in her usual daily activities.         Preventive Screening-Counseling & Management  Alcohol-Tobacco     Smoking Status: quit     Year Quit: 7 years ago     Pack years: three packs aday  Problems Prior to Update: 1)  Cough  (ICD-786.2) 2)  Biceps Tendinitis, Right  (ICD-726.12) 3)  Shoulder Pain, Right  (ICD-719.41) 4)  Lesion,  Cervix  (ICD-236.3) 5)  Neck Pain, Left  (ICD-723.1) 6)  Peripheral Vascular Disease With Claudication  (ICD-443.89) 7)  Family History of Colon Ca 1st Degree Relative <60  (ICD-V16.0) 8)  Aftercare, Long-term Use, Medications Nec  (ICD-V58.69) 9)  Hand Pain, Right  (ICD-729.5) 10)  Depression  (ICD-311) 11)  Arthropathy Nos, 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 Take 1 Tablet By Mouth Once A Day 2)  Neurontin 300 Mg Caps (Gabapentin) .... Take 2  Tablets By Mouth 3 Times A Day 3)  Norvasc 10 Mg Tabs (Amlodipine Besylate) .... Take 1 Tablet By Mouth Once A Day 4)  Nitroglycerin 0.4 Mg Subl (Nitroglycerin) .Marland Kitchen.. 1 Tablet By Mouth Every 5 Minutes P.r.n. Times 3 For Chest Pain 5)  Simvastatin 40 Mg Tabs (Simvastatin) .... Take 1  Tablet By Mouth At Bedtime 6)  Tylenol .... P.r.n. 7)  Hydrochlorothiazide 25 Mg Tabs (Hydrochlorothiazide) .... Take 1 Tablet By Mouth Once A Day 8)  Pletal 100 Mg Tabs (Cilostazol) .... Take 1 Tablet By Mouth Two Times A Day 9)  Adult Aspirin Ec Low Strength 81 Mg  Tbec (Aspirin) .... Take 1 Tablet By Mouth Once A Day 10)  Omeprazole 20 Mg Tbec (Omeprazole) .... Take 1 Tablet By Mouth Two Times A Day 11)  Vicodin Hp 10-660 Mg Tabs (Hydrocodone-Acetaminophen) .... Take One Tab By Mouth Once Every 4 To 6 Hours As Needed For Pain 12)  Zithromax 500 Mg Tabs (Azithromycin) .... Take 1 Tablet By Mouth Once A Day For Five Days 13)  Tessalon Perles 100 Mg Caps (Benzonatate) .... One  Pill As Needed 4-6 Times A Day For Cough  Current Medications (verified): 1)  Toprol Xl 100 Mg Tb24 (Metoprolol Succinate) .... Take 1 Tablet By Mouth Once A Day 2)  Neurontin 300 Mg Caps (Gabapentin) .... Take 2  Tablets By Mouth 3 Times A Day 3)  Norvasc 10 Mg Tabs (Amlodipine Besylate) .... Take 1 Tablet By Mouth Once A Day 4)  Nitroglycerin 0.4 Mg Subl (Nitroglycerin) .Marland Kitchen.. 1 Tablet By Mouth Every 5 Minutes P.r.n. Times 3 For Chest Pain 5)  Simvastatin 40 Mg Tabs (Simvastatin) .... Take 1 Tablet By Mouth At Bedtime 6)  Tylenol .... P.r.n. 7)  Hydrochlorothiazide 25 Mg Tabs (Hydrochlorothiazide) .... Take 1 Tablet By Mouth Once A Day 8)  Pletal 100 Mg Tabs (Cilostazol) .... Take 1 Tablet By Mouth Two Times A Day 9)  Adult Aspirin Ec Low Strength 81 Mg  Tbec (Aspirin) .... Take 1 Tablet By Mouth Once A Day 10)  Omeprazole 20 Mg Tbec (Omeprazole) .... Take 1 Tablet By Mouth Two Times A Day 11)  Vicodin Hp 10-660 Mg Tabs (Hydrocodone-Acetaminophen) .... Take One Tab By Mouth Once Every 4 To 6 Hours As Needed For Pain  Allergies: 1)  ! Sulfa  Past History:  Past Medical History: Last updated: 12/19/2006 Current Problems:  CHEST PAIN (ICD-786.50) ABDOMINAL PAIN, GENERALIZED, CHRONIC (ICD-789.07) ARTHROSCOPY, KNEE, HX OF (ICD-V45.89) CYST, OVARIAN NEC/NOS (ICD-620.2) CHOLECYSTECTOMY, HX OF (ICD-V45.79) DIVERTICULOSIS, COLON (ICD-562.10) ESOPHAGEAL STRICTURE (ICD-530.3) HIATAL HERNIA (ICD-553.3) HEMORRHOIDS, INTERNAL (ICD-455.0) ROTATOR CUFF SYNDROME (ICD-726.10) HYPERTENSION (ICD-401.9) HYPERLIPIDEMIA (ICD-272.4) GERD (ICD-530.81) COLON CANCER, HX OF (ICD-V10.05) Depression  Family History: Last updated: 01/09/2007 Family History of Colon CA 1st degree relative <60  Risk Factors: Exercise: yes (10/19/2009)  Risk Factors: Smoking Status: quit (03/29/2010)  Family History: Reviewed history from 01/09/2007 and no changes required. Family History of Colon CA 1st degree  relative <60  Review of Systems       The patient complains of dyspnea on exertion and difficulty walking.  The patient denies fever, headaches, hemoptysis, abdominal pain, melena, hematochezia, and hematuria.    Physical Exam  General:  alert and overweight-appearing.   Mouth:  pharynx pink and moist, no erythema, and no exudates.   Neck:  supple.   Lungs:  normal respiratory effort and normal breath sounds.  bilaterally good air entry.  Heart:  normal rate and regular rhythm.   Abdomen:  soft and non-tender.   Extremities:  no cyanosis, clubbing or edema  Neurologic:  non focal.    Impression & Recommendations:  Problem # 1:  HYPERTENSION (ICD-401.9) Improved but still not at goal. Could consider adding ace-inhibitor on follow up.  Her updated medication list for this problem includes:    Toprol  Xl 100 Mg Tb24 (Metoprolol succinate) .Marland Kitchen... Take 1 tablet by mouth once a day    Norvasc 10 Mg Tabs (Amlodipine besylate) .Marland Kitchen... Take 1 tablet by mouth once a day    Hydrochlorothiazide 25 Mg Tabs (Hydrochlorothiazide) .Marland Kitchen... Take 1 tablet by mouth once a day  BP today: 147/92 Prior BP: 156/98 (10/19/2009)  Labs Reviewed: K+: 3.9 (06/16/2007) Creat: : 0.81 (06/16/2007)     Problem # 2:  PERIPHERAL VASCULAR DISEASE WITH CLAUDICATION (ICD-443.89) Per [name]. Patient has an appointment in September. Will follow up.  Problem # 3:  SHOULDER PAIN, RIGHT (ICD-719.41) Most likely 2/2 subacromial bursitis. Will treat conservatively for now. May consider steroid injection if does not improve. Discussed shoulder exercises, use of moist heat or ice, and medication.   Her updated medication list for this problem includes:    Adult Aspirin Ec Low Strength 81 Mg Tbec (Aspirin) .Marland Kitchen... Take 1 tablet by mouth once a day    Vicodin Hp 10-660 Mg Tabs (Hydrocodone-acetaminophen) .Marland Kitchen... Take one tab by mouth once every 4 to 6 hours as needed for pain  Problem # 4:  HYPERLIPIDEMIA (ICD-272.4) Will  check FLP, hepatic function test, and reasses.  Her updated medication list for this problem includes:    Simvastatin 40 Mg Tabs (Simvastatin) .Marland Kitchen... Take 1 tablet by mouth at bedtime  Labs Reviewed: SGOT: 20 (01/09/2007)   SGPT: 21 (01/09/2007)  Problem # 5:  GERD (ICD-530.81) Stable. Continue current regimen.  Her updated medication list for this problem includes:    Omeprazole 20 Mg Tbec (Omeprazole) .Marland Kitchen... Take 1 tablet by mouth two times a day  Problem # 6:  Preventive Health Care (ICD-V70.0) Patient due for Mammogram which was ordered. She is also due for Pap smear. She want to do Pap smear on follow up.  Complete Medication List: 1)  Toprol Xl 100 Mg Tb24 (Metoprolol succinate) .... Take 1 tablet by mouth once a day 2)  Neurontin 300 Mg Caps (Gabapentin) .... Take 2  tablets by mouth 3 times a day 3)  Norvasc 10 Mg Tabs (Amlodipine besylate) .... Take 1 tablet by mouth once a day 4)  Nitroglycerin 0.4 Mg Subl (Nitroglycerin) .Marland Kitchen.. 1 tablet by mouth every 5 minutes p.r.n. times 3 for chest pain 5)  Simvastatin 40 Mg Tabs (Simvastatin) .... Take 1 tablet by mouth at bedtime 6)  Tylenol  .... P.r.n. 7)  Hydrochlorothiazide 25 Mg Tabs (Hydrochlorothiazide) .... Take 1 tablet by mouth once a day 8)  Pletal 100 Mg Tabs (Cilostazol) .... Take 1 tablet by mouth two times a day 9)  Adult Aspirin Ec Low Strength 81 Mg Tbec (Aspirin) .... Take 1 tablet by mouth once a day 10)  Omeprazole 20 Mg Tbec (Omeprazole) .... Take 1 tablet by mouth two times a day 11)  Vicodin Hp 10-660 Mg Tabs (Hydrocodone-acetaminophen) .... Take one tab by mouth once every 4 to 6 hours as needed for pain 12)  Voltaren 1 % Gel (Diclofenac sodium) .... Apply thin layer of gel over affected area four times a day for a week  Other Orders: Mammogram (Screening) (Mammo) Future Orders: T-Lipid Profile ([id number]) ... 03/30/2010 T-Hepatic Function [id number]) ... 03/30/2010  Patient Instructions: 1)  Please  schedule a follow-up appointment in 3 months. 2)  Take all medication as directed. 3)  You will be called with any abnormalities in the tests scheduled or performed today.  If you don't hear from [place] within a week from when the test was performed, you can  assume that your test was normal.  Prescriptions: VOLTAREN 1 % GEL (DICLOFENAC SODIUM) Apply thin layer of gel over affected area four times a day for a week  #100gm x 0   Entered and Authorized by:   Laren Everts MD   Signed by:   Laren Everts MD on 03/29/2010   Method used:   Print then Give to Patient   RxID:   [id number] VICODIN HP 10-660 MG TABS (HYDROCODONE-ACETAMINOPHEN) Take one tab by mouth once every 4 to 6 hours as needed for pain  #62 x 1   Entered and Authorized by:   Laren Everts MD   Signed by:   Laren Everts MD on 03/29/2010   Method used:   Print then Give to Patient   RxID:   [id number]   Prevention & Chronic Care Immunizations   Influenza vaccine: Not documented    Tetanus booster: Not documented    Pneumococcal vaccine: Not documented  Colorectal Screening   Hemoccult: Not documented    Colonoscopy: Not documented  Other Screening   Pap smear: Normal  (10/03/2005)    Mammogram: Normal  (06/11/2006)   Mammogram action/deferral: Ordered  (03/29/2010)   Smoking status: quit  (03/29/2010)  Lipids   Total Cholesterol: Not documented   Lipid panel action/deferral: Lipid Panel ordered   LDL: Not documented   LDL Direct: Not documented   HDL: Not documented   Triglycerides: Not documented    SGOT (AST): 20  (01/09/2007)   SGPT (ALT): 21  (01/09/2007)   Alkaline phosphatase: 73  (01/09/2007)   Total bilirubin: 0.3  (01/09/2007)    Lipid flowsheet reviewed?: Yes   Progress toward LDL goal: Unchanged  Hypertension   Last Blood Pressure: 147 / 92  (03/29/2010)   Serum creatinine: 0.81  (06/16/2007)   Serum potassium 3.9  (06/16/2007)    Hypertension  flowsheet reviewed?: Yes   Progress toward BP goal: Improved  Self-Management Support :   Personal Goals (by the next clinic visit) :      Personal blood pressure goal: 140/90  (10/19/2009)     Personal LDL goal: 100  (10/19/2009)    Patient will work on the following items until the next clinic visit to reach self-care goals:     Medications and monitoring: take my medicines every day, bring all of my medications to every visit  (03/29/2010)     Eating: drink diet soda or water instead of juice or soda, eat more vegetables, use fresh or frozen vegetables, eat foods that are low in salt, eat baked foods instead of fried foods, eat fruit for snacks and desserts, limit or avoid alcohol  (03/29/2010)     Activity: take a 30 minute walk every day  (03/29/2010)    Hypertension self-management support: Written self-care plan, Education handout, Pre-printed educational material, Resources for patients handout  (03/29/2010)   Hypertension self-care plan printed.   Hypertension education handout printed    Lipid self-management support: Written self-care plan, Education handout, Pre-printed educational material, Resources for patients handout  (03/29/2010)   Lipid self-care plan printed.   Lipid education handout printed      Resource handout printed.   Nursing Instructions: Schedule screening mammogram (see order) Gyn referral for screening Pap (see order)    Process Orders Check Orders Results:     [facility]: Check successful Tests Sent for requisitioning (April 01, 2010 2:48 PM):     03/30/2010: [facility] -- T-Lipid Profile [phone] (signed)  03/30/2010: [facility] -- T-Hepatic Function [phone] (signed)

## 2010-09-13 NOTE — Procedures (Signed)
Summary: EGD   EGD  Procedure date:  08/02/2002  Findings:      Location: [facility]  Findings: Stricture:  GERD Patient Name: [name], [name] MRN: [id number] Procedure Procedures: Panendoscopy (EGD) CPT: 43235.    with esophageal dilation. CPT: G9296129.  Personnel: Endoscopist: [name]. Marina Goodell, MD.  Exam Location: Exam performed in Endoscopy Suite.  Patient Consent: Procedure, Alternatives, Risks and Benefits discussed, consent obtained,  Indications Symptoms: Dysphagia. Chest Pain. Reflux symptoms  History  Pre-Exam Physical: Performed Aug 02, 2002  Entire physical exam was normal.  Exam Exam Info: Maximum depth of insertion Duodenum, intended Duodenum. Patient position: on left side. Vocal cords visualized. Gastric retroflexion performed. Images taken. ASA Classification: II. Tolerance: excellent.  Sedation Meds: Demerol 70 mg. given IV. Versed 7 mg. given IV.  Monitoring: BP and pulse monitoring done. Oximetry used. Supplemental O2 given  Fluoroscopy: Fluoroscopy was used.  Findings HIATAL HERNIA: Regular,  STRICTURE / STENOSIS: Stricture in Distal Esophagus.  Constriction: partial. Etiology: benign due to reflux. 34 cm from mouth. Lumen diameter is 15 mm. ICD9: Esophageal Stricture: 530.3.  - Dilation: Distal Esophagus. Procedure was performed under Fluoroscopy. Wire Guided/Savary (Wilson-Cook) dilator used, Diameter: 18 mm, No Resistance, No Heme present on extraction. 1  total dilators used. Patient tolerance excellent.   Assessment Abnormal examination, see findings above.  Diagnoses: 530.3: Esophageal Stricture.  530.81: GERD.   Events  Unplanned Intervention: No unplanned interventions were required.  Unplanned Events: There were no complications. Plans Instructions: Nothing to eat or drink for 2 hrs.  Clear or full liquids: 2 hrs. Resume previous diet: am.  Medication(s): Continue current medications.  Patient Education: Patient  given standard instructions for: Stenosis / Stricture.  Disposition: After procedure patient sent to recovery. After recovery patient sent home.  Comments: Return to the care of Dr. Emeline Darling  This report was created from the original endoscopy report, which was reviewed and signed by the above listed endoscopist.   cc:  Lewayne Bunting, MD      Duke Salvia, NP

## 2010-09-13 NOTE — Procedures (Signed)
Summary: Colon   Colonoscopy  Procedure date:  07/18/2005  Findings:      Location:  [facility].    Colonoscopy  Procedure date:  07/18/2005  Findings:      Location:  [facility].   Patient Name: [name], [name] MRN: [id number] Procedure Procedures: Colonoscopy CPT: 91478.  Personnel: Endoscopist: [name]. Marina Goodell, MD.  Exam Location: Exam performed in Outpatient Clinic. Outpatient  Patient Consent: Procedure, Alternatives, Risks and Benefits discussed, consent obtained, from patient. Consent was obtained by the RN.  Indications  Surveillance of: Adenomatous Polyp(s). This is an initial surveillance exam. Initial polypectomy was performed in 2003. in Dec. 1-2 Polyps were found at Index Exam. Largest polyp removed was 1 to 5 mm. Prior polyp located in both proximal and distal colon. Pathology of worst  polyp: tubular adenoma.  History  Current Medications: Patient is not currently taking Coumadin.  Pre-Exam Physical: Performed Jul 18, 2005. Cardio-pulmonary exam, Rectal exam, Abdominal exam, Mental status exam WNL.  Exam Exam: Extent of exam reached: Cecum, extent intended: Cecum.  The cecum was identified by appendiceal orifice and IC valve. Patient position: from side to side. Colon retroflexion performed. Images taken. ASA Classification: II. Tolerance: excellent.  Monitoring: Pulse and BP monitoring, Oximetry used. Supplemental O2 given.  Colon Prep Used MIRALAX for colon prep. Prep results: good.  Sedation Meds: Patient assessed and found to be appropriate for moderate (conscious) sedation. Fentanyl 100 mcg. given IV. Versed 10 mg. given IV.  Findings NORMAL EXAM: Cecum to Rectum. Comments: Mild sigmoid diverticulosis and hemorrhoids present.   Assessment  Diagnoses: 562.10: Diverticulosis, Colon. mild.  455.0: Hemorrhoids, Internal.   Comments: NO POLYPS SEEN Events  Unplanned Interventions: No intervention was  required.  Unplanned Events: There were no complications. Plans Disposition: After procedure patient sent to recovery. After recovery patient sent home.  Scheduling/Referral: Colonoscopy, to [name]. Marina Goodell, MD, IN 5 YEARS,    This report was created from the original endoscopy report, which was reviewed and signed by the above listed endoscopist.

## 2010-09-13 NOTE — Assessment & Plan Note (Signed)
Summary: Followup epigastric pain (seen 04-20-10)   History of Present Illness Visit Type: Follow-up Visit Primary GI MD: Yancey Flemings MD Primary Provider: Laren Everts MD Chief Complaint: pateint complains of epigastric pain, fullness, constipation & nausea x1 month History of Present Illness:   60 year old female with hypertension, hyperlipidemia, obesity, GERD with peptic stricture, adenomatous colon polyps, peripheral vascular disease, and prior cholecystectomy. She presents today for followup regarding abdominal pain. Seen by my partner, Dr. Christella Hartigan, in my absence tomorrow to go. Her complaint is that of fullness in the mid chest with eating as well as postprandial epigastric discomfort. This has been going on for several months. She also reports a 10 pound weight loss. No active reflux symptoms. She takes omeprazole 20 mg b.i.d. daily. Her last colonoscopy was in 2006 without polyps. She is due for followup. Her last upper endoscopy. Should then 2006 as well. Benign peptic stricture was dilated. Otherwise normal. She does have chronic chest wall pain. At the time of her last visit she was placed on Dexilant. This did not seem to help. She also underwent a CT scan of the abdomen and pelvis which did not show any acute abnormalities. Significant peripheral vascular disease in the left common iliac and right femoral arteries. I spoke with Dr. Hulan Amato a few moments ago regarding the mesenteric vasculature. Though she has aortic plaque, the origin of her celiac, SMA, and IMA are patent.   GI Review of Systems    Reports abdominal pain, acid reflux, chest pain, loss of appetite, nausea, and  weight loss.     Location of  Abdominal pain: epigastric area. Weight loss of 10 pounds pounds over several months.   Denies belching, bloating, dysphagia with liquids, dysphagia with solids, heartburn, vomiting, vomiting blood, and  weight gain.      Reports constipation and  rectal pain.    Denies anal fissure, black tarry stools, change in bowel habit, diarrhea, diverticulosis, fecal incontinence, heme positive stool, hemorrhoids, irritable bowel syndrome, jaundice, light color stool, liver problems, and  rectal bleeding. Preventive Screening-Counseling & Management  Alcohol-Tobacco     Smoking Status: quit      Drug Use:  no.      Current Medications (verified): 1)  Toprol Xl 100 Mg Tb24 (Metoprolol Succinate) .... Take 1 Tablet By Mouth Once A Day 2)  Neurontin 300 Mg Caps (Gabapentin) .... Take 2  Tablets By Mouth 3 Times A Day 3)  Norvasc 10 Mg Tabs (Amlodipine Besylate) .... Take 1 Tablet By Mouth Once A Day 4)  Nitroglycerin 0.4 Mg Subl (Nitroglycerin) .Marland Kitchen.. 1 Tablet By Mouth Every 5 Minutes P.r.n. Times 3 For Chest Pain 5)  Simvastatin 40 Mg Tabs (Simvastatin) .... Take 1 Tablet By Mouth At Bedtime 6)  Tylenol .... P.r.n. 7)  Hydrochlorothiazide 25 Mg Tabs (Hydrochlorothiazide) .... Take 1 Tablet By Mouth Once A Day 8)  Pletal 100 Mg Tabs (Cilostazol) .... Take 1 Tablet By Mouth Two Times A Day 9)  Adult Aspirin Ec Low Strength 81 Mg  Tbec (Aspirin) .... Take 1 Tablet By Mouth Once A Day 10)  Omeprazole 20 Mg Tbec (Omeprazole) .... Take 1 Tablet By Mouth Two Times A Day 11)  Vicodin Hp 10-660 Mg Tabs (Hydrocodone-Acetaminophen) .... Take One Tab By Mouth Once Every 4 To 6 Hours As Needed For Pain 12)  Voltaren 1 % Gel (Diclofenac Sodium) .... Apply Thin Layer of Gel Over Affected Area Four Times A Day For A Week 13)  Dexilant 60 Mg  Cpdr (Dexlansoprazole) .Marland Kitchen.. 1 By Mouth Once Daily 14)  Carafate 1 Gm/71ml Susp (Sucralfate) .Marland Kitchen.. 1 Gram Two Times A Day  Allergies (verified): 1)  ! Sulfa  Past History:  Past Medical History: Reviewed history from 12/19/2006 and no changes required. 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 cholecystectomy 2007, laparoscopically  Family History: Sarcodosis: Sister Family History of Heart Disease: Brother Family History of Stomach Cancer:Mother?  Social History: Occupation: Retired Patient is a former smoker.  Alcohol Use - no Illicit Drug Use - no Drug Use:  no  Review of Systems       The patient complains of arthritis/joint pain, back pain, heart rhythm changes, muscle pains/cramps, night sweats, shortness of breath, sleeping problems, and swelling of feet/legs.  The patient denies allergy/sinus, anemia, anxiety-new, blood in urine, breast changes/lumps, change in vision, confusion, cough, coughing up blood, depression-new, fainting, fatigue, fever, headaches-new, hearing problems, heart murmur, itching, menstrual pain, nosebleeds, pregnancy symptoms, skin rash, sore throat, swollen lymph glands, thirst - excessive, urination - excessive, urination changes/pain, urine leakage, vision changes, and voice change.    Vital Signs:  Patient profile:   60 year old female Height:      62 inches Weight:      197 pounds BMI:     36.16 Pulse rate:   76 / minute Pulse rhythm:   regular BP sitting:   150 / 90  (left arm) Cuff size:   regular  Vitals Entered By: June McMurray CMA Duncan Dull) (June 22, 2010 1:25 PM)  Physical Exam  General:  Well developed, obese,well nourished, no acute distress. Head:  Normocephalic and atraumatic. Eyes:  anicteric Mouth:  No deformity or lesions. Neck:  Supple; no masses or thyromegaly. Lungs:  Clear throughout to auscultation. Heart:  Regular rate and rhythm;  no murmurs, rubs,  or bruits. Abdomen:  Soft,obese, nontender and nondistended. No masses, hepatosplenomegaly or hernias noted. Normal bowel sounds. Rectal:  deferred Msk:  DJD changes Pulses:  Normal pulses noted. Extremities:  no edema Neurologic:  alert and oriented Skin:  Intact without significant lesions or rashes. Psych:  Alert and cooperative. Normal mood and affect.   Impression & Recommendations:  Problem # 1:  ABDOMINAL PAIN, EPIGASTRIC (ICD-789.06) ongoing epigastric pain exacerbated by meals associated with fullness. Etiology unclear. Symptoms despite PPI. Weight loss concerning. CT unrevealing.  Plan: #1. Upper endoscopy to further evaluate ongoing pain. The nature of the procedure as well as the risks, benefits, and alternatives were reviewed. She understood and agreed to proceed  Problem # 2:  WEIGHT LOSS (ICD-783.21) seems to be related to decreased p.o. intake. Rule out panic cost. Rule out functional.  Problem # 3:  GERD (ICD-530.81) GERD complicated by peptic stricture. No classic GERD symptoms on PPI. No recurrent dysphagia history.  Plan: #1. Reflux precautions #2. Continue PPI #3. Esophageal dilation p.r.n. recurrent dysphagia  Problem # 4:  ESOPHAGEAL STRICTURE (ICD-530.3) Assessment: Comment Only  Problem # 5:  PERSONAL HX COLONIC POLYPS (ICD-V12.72) history of adenomatous polyps. Last colonoscopy in December 2006 negative for polyps. Mild diverticulosis only. No due for followup.  Plan: #1. After her evaluation for abdominal pain via EGD, patient  will be set up for her surveillance colonoscopy  Other Orders: EGD (EGD)  Patient Instructions: 1)  Copy sent to : Laren Everts MD 2)  EGD [facility] 06/27/10 4:00 pm arrive at 3:00 pm 3)  Upper Endoscopy brochure given.  4)  The medication list was reviewed and reconciled.  All changed / newly prescribed medications were explained.  A complete medication list was provided to the patient /  caregiver.

## 2010-09-13 NOTE — Progress Notes (Signed)
Summary: speak to nurse  Phone Note Call from Patient Call back at Home Phone [phone]   Caller: Patient Call For: Dr Christella Hartigan Reason for Call: Talk to Nurse Summary of Call: Patient wants to speak to nurse regarding questions she has regarding test that Dr Christella Hartigan had ordered for her. Initial call taken by: Tawni Levy,  April 30, 2010 3:05 PM  Follow-up for Phone Call        Reviewed the results with the patient again.  She has asked me to send a copy Edilia Bo her Physiological scientist.  I have sent a copy at her request. Follow-up by: Darcey Nora RN, CGRN,  April 30, 2010 3:25 PM

## 2010-09-13 NOTE — Assessment & Plan Note (Signed)
Summary: NEED MEDICATION/ FEELING SICK/ SB   Vital Signs:  Patient profile:   60 year old female Height:      62 inches (157.48 cm) Weight:      210.9 pounds (99.09 kg) BMI:     38.71 Temp:     98.8 degrees F (37.11 degrees C) oral Pulse rate:   72 / minute BP sitting:   156 / 98  (right arm) Cuff size:   large  Vitals Entered By: Theotis Barrio NT II (October 19, 2009 1:40 PM) CC: 4 WEEKS OF HEAD CONGESTION / BODY ACHE / COUGHING  /  MEDICATION REFILL,  Is Patient Diabetic? No Nutritional Status BMI of > 30 = obese  Have you ever been in a relationship where you felt threatened, hurt or afraid?No   Does patient need assistance? Functional Status Self care Ambulation Normal Comments BODY ACHE / COUGHING  /  HEAD CONGESTION  - THIS IS THE 4TH WEEK   Primary Care Provider:  Olene Craven MD  CC:  4 WEEKS OF HEAD CONGESTION / BODY ACHE / COUGHING  /  MEDICATION REFILL and .  History of Present Illness: [name] is a 60 year old Female with PMH/problems as outlined in the EMR, who presents to the [facility] with chief complaint(s) of:    Has been having throat pain, headache, cough and chest discomfort for the past few days. Also has body ache. She took claritin and cough drops, but nothing is helping. Has cold sore on her lip, and feels hot and cold at times. Has trouble breathing too. Phlegm: greenish/ whitish. No one else sick in the house. No h/o recent travel. Her ears also feel popping both sides.   Depression History:      The patient denies a depressed mood most of the day and a diminished interest in her usual daily activities.         Preventive Screening-Counseling & Management  Alcohol-Tobacco     Smoking Status: quit     Year Quit: 7 years ago     Pack years: three packs aday  Caffeine-Diet-Exercise     Does Patient Exercise: yes     Type of exercise: rom     Times/week: 2  Current Medications (verified): 1)  Toprol Xl 100 Mg Tb24 (Metoprolol Succinate) ....  Take 1 Tablet By Mouth Once A Day 2)  Neurontin 300 Mg Caps (Gabapentin) .... Take 2  Tablets By Mouth 3 Times A Day 3)  Norvasc 10 Mg Tabs (Amlodipine Besylate) .... Take 1 Tablet By Mouth Once A Day 4)  Nitroglycerin 0.4 Mg Subl (Nitroglycerin) .Marland Kitchen.. 1 Tablet By Mouth Every 5 Minutes P.r.n. Times 3 For Chest Pain 5)  Simvastatin 40 Mg Tabs (Simvastatin) .... Take 1 Tablet By Mouth At Bedtime 6)  Tylenol .... P.r.n. 7)  Hydrochlorothiazide 25 Mg Tabs (Hydrochlorothiazide) .... Take 1 Tablet By Mouth Once A Day 8)  Pletal 100 Mg Tabs (Cilostazol) .... Take 1 Tablet By Mouth Two Times A Day 9)  Adult Aspirin Ec Low Strength 81 Mg  Tbec (Aspirin) .... Take 1 Tablet By Mouth Once A Day 10)  Omeprazole 20 Mg Tbec (Omeprazole) .... Take 1 Tablet By Mouth Two Times A Day 11)  Vicodin Hp 10-660 Mg Tabs (Hydrocodone-Acetaminophen) .... Take One Tab By Mouth Once Every 4 To 6 Hours As Needed For Pain  Allergies (verified): 1)  ! 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  mildly short of breath.  Head:  normocephalic and atraumatic.   Ears:  mildly erythematous tympanic membrane bilat.  Mouth:  pharynx  pink and moist, no erythema, and no exudates.   Lungs:  normal respiratory effort and normal breath sounds.  bilaterally good air entry.  Heart:  normal rate and regular rhythm.   Abdomen:  soft and non-tender.   Pulses:  normal peripheral pulses  Extremities:  no cyanosis, clubbing or edema  Neurologic:  non focal.    Impression & Recommendations:  Problem # 1:  COUGH (ICD-786.2) CXR done. No evidence PNA. Will treat her for URI with zithromax.   Orders: CXR- 2view (CXR)  Problem # 2:  HYPERTENSION (ICD-401.9) High BP noted, patient out of meds. Refills done.  Her updated medication list for this problem includes:    Toprol Xl 100 Mg Tb24 (Metoprolol succinate) .Marland Kitchen... Take 1 tablet by mouth once a day    Norvasc 10 Mg Tabs (Amlodipine besylate) .Marland Kitchen... Take 1 tablet by mouth once a day    Hydrochlorothiazide 25 Mg Tabs (Hydrochlorothiazide) .Marland Kitchen... Take 1 tablet by mouth once a day  Problem # 3:  HYPERLIPIDEMIA (ICD-272.4) Will get fasting lipid panel next visit.  Her updated medication list for this problem includes:    Simvastatin 40 Mg Tabs (Simvastatin) .Marland Kitchen... Take 1 tablet by mouth at bedtime  Problem # 4:  Preventive Health Care (ICD-V70.0) Patients wants to come back for a review.   Complete Medication List: 1)  Toprol Xl 100 Mg Tb24 (Metoprolol succinate) .... Take 1 tablet by mouth once a day 2)  Neurontin 300 Mg Caps (Gabapentin) .... Take 2  tablets by mouth 3 times a day 3)  Norvasc 10 Mg Tabs (Amlodipine besylate) .... Take 1 tablet by mouth once a day 4)  Nitroglycerin 0.4 Mg Subl (Nitroglycerin) .Marland Kitchen.. 1 tablet by mouth every 5 minutes p.r.n. times 3 for chest pain 5)  Simvastatin 40 Mg Tabs (Simvastatin) .... Take 1 tablet by mouth at bedtime 6)  Tylenol  .... P.r.n. 7)  Hydrochlorothiazide 25 Mg Tabs (Hydrochlorothiazide) .... Take 1 tablet by mouth once a day 8)  Pletal 100 Mg Tabs (Cilostazol) .... Take 1 tablet by mouth two times a day 9)  Adult Aspirin Ec Low  Strength 81 Mg Tbec (Aspirin) .... Take 1 tablet by mouth once a day 10)  Omeprazole 20 Mg Tbec (Omeprazole) .... Take 1 tablet by mouth two times a day 11)  Vicodin Hp 10-660 Mg Tabs (Hydrocodone-acetaminophen) .... Take one tab by mouth once every 4 to 6 hours as needed for pain 12)  Zithromax 500 Mg Tabs (Azithromycin) .... Take 1 tablet by mouth once a day for five days 13)  Tessalon Perles 100 Mg Caps (Benzonatate) .... One pill as needed 4-6 times a day for cough  Patient Instructions: 1)  Please keep up your follow up appointment. May 4th Wed 3 O'clock 2)  Do  let us know if your problem worsens.   Prescriptions: PLETAL 100 MG TABS (CILOSTAZOL) Take 1 tablet by mouth two times a day  #60 x 11   Entered and Authorized by:   Zara Council MD   Signed by:   Zara Council MD on 10/19/2009   Method used:   Electronically to        [facility] #[id number]* (retail)       [address] [place], [place]  [address]       Ph: [phone]       Fax: [phone]   RxID:   [id number] OMEPRAZOLE 20 MG TBEC (OMEPRAZOLE) Take 1 tablet by mouth two times a day  #60 x 11   Entered and Authorized by:   Zara Council MD   Signed by:   Zara Council MD on 10/19/2009   Method used:   Electronically to        [facility] #[id number]* (retail)       [address] [place], [place]  [address]       Ph: [phone]       Fax: [phone]   RxID:   [id number] HYDROCHLOROTHIAZIDE 25 MG TABS (HYDROCHLOROTHIAZIDE) Take 1 tablet by mouth once a day  #30 x 11   Entered and Authorized by:   Zara Council MD   Signed by:   Zara Council MD on 10/19/2009   Method used:   Electronically to        [facility] #[id number]* (retail)       [address] [place], [place]  [address]       Ph: [phone]       Fax: [phone]   RxID:   [id number] SIMVASTATIN 40 MG TABS (SIMVASTATIN) Take 1 tablet by mouth at bedtime  #30 x 11   Entered and Authorized by:    Zara Council MD   Signed by:   Zara Council MD on 10/19/2009   Method used:   Electronically to        [facility] #[id number]* (retail)       [address] [place], [place]  [address]       Ph: [phone]       Fax: [phone]   RxID:   [id number] NORVASC 10 MG TABS (AMLODIPINE BESYLATE) Take 1 tablet by mouth once a day  #30 x 11   Entered and Authorized by:   Zara Council MD   Signed by:   Zara Council MD on 10/19/2009   Method used:   Electronically to        [facility] #[id number]* (retail)       [address] [place], [place]  [address]       Ph: [phone]       Fax: [phone]   RxID:   [id number] NEURONTIN 300 MG CAPS (GABAPENTIN) Take 2  tablets by mouth 3 times a day  #180 x 11   Entered and Authorized by:   Zara Council MD   Signed by:   Zara Council MD on 10/19/2009   Method used:   Electronically to        [facility] #[id number]* (retail)       [address]  [place], [place]  [address]       Ph: [phone]       Fax: [phone]   RxID:   [id number] TOPROL XL 100 MG TB24 (METOPROLOL SUCCINATE) Take 1 tablet by mouth once a day  #30 x 11   Entered and Authorized by:   Zara Council MD   Signed by:   Zara Council MD on 10/19/2009   Method used:   Electronically to        [facility] #[id number]* (retail)       [address] [place], [place]  [address]       Ph: [phone]       Fax: [phone]   RxID:   [id number] TESSALON PERLES 100 MG CAPS (BENZONATATE) One pill as needed 4-6 times a day for cough  #25 x 0   Entered and Authorized by:   Zara Council MD   Signed by:   Zara Council MD on 10/19/2009   Method used:   Electronically to        [facility] #[id number]* (retail)       [address] [place], [place]  [address]       Ph: [phone]       Fax: [phone]   RxID:   [id number] ZITHROMAX 500 MG TABS (AZITHROMYCIN) Take 1 tablet by mouth once a day  for five days  #5 x 0   Entered and Authorized by:   Zara Council MD   Signed by:   Zara Council MD on 10/19/2009   Method used:   Electronically to        [facility] #[id number]* (retail)       [address] [place], [place]  [address]       Ph: [phone]       Fax: [phone]   RxID:   [id number]    Prevention & Chronic Care Immunizations   Influenza vaccine: Not documented    Tetanus booster: Not documented    Pneumococcal vaccine: Not documented  Colorectal Screening   Hemoccult: Not documented    Colonoscopy: Not documented  Other Screening   Pap smear: Normal  (10/03/2005)    Mammogram: Normal  (06/11/2006)   Smoking status: quit  (10/19/2009)  Lipids   Total Cholesterol: Not documented   LDL: Not documented   LDL Direct: Not documented   HDL: Not documented   Triglycerides: Not documented    SGOT (AST): 20  (01/09/2007)   SGPT (ALT): 21  (01/09/2007)   Alkaline phosphatase: 73  (01/09/2007)   Total bilirubin: 0.3  (01/09/2007)    Lipid flowsheet reviewed?: Yes   Progress toward LDL goal: Unchanged  Hypertension   Last Blood Pressure: 156 / 98  (10/19/2009)   Serum creatinine: 0.81  (06/16/2007)   Serum potassium 3.9  (06/16/2007)    Hypertension flowsheet reviewed?: Yes   Progress toward BP goal: Deteriorated  Self-Management Support :   Personal Goals (by the next clinic visit) :      Personal blood pressure goal: 140/90  (10/19/2009)     Personal LDL goal: 100  (10/19/2009)    Patient will work on the following items until the next clinic visit to reach self-care goals:     Medications and monitoring: take my medicines every day, bring all of my medications to every visit  (10/19/2009)  Eating: drink diet soda or water instead of juice or soda, eat more vegetables, use fresh or frozen vegetables, eat foods that are low in salt, eat baked foods instead of fried foods, eat fruit for snacks and desserts, limit or avoid  alcohol  (10/19/2009)     Activity: take a 30 minute walk every day  (10/19/2009)    Hypertension self-management support: Resources for patients handout  (10/19/2009)    Lipid self-management support: Resources for patients handout  (10/19/2009)        Resource handout printed.

## 2010-09-13 NOTE — Assessment & Plan Note (Signed)
History of Present Illness Visit Type: Initial Visit Primary GI MD: Yancey Flemings MD Primary Provider: Laren Everts MD Chief Complaint: severe upper abdominal pain and nausea ([facility] History of Present Illness:     very pleasant 60 year old woman who underwent colonoscopy and upper endoscopy by Dr. Marina Goodell for years ago. Colonoscopy found diverticulosis, no polyps. Upper endoscopy found GERD related minor stricture and hiatal hernia.  who has had pain since Sunday.  From mid epig up to mid sternum. Eating causing nausea without vomiting.  She has been having constipation recently.  Eating makes the pain worse.  Nothing makes the pain improve.  She has been on omeprazole for years, twice daily. This helps with dyspepsia/indigestion symptoms.    No new medicines.  no new fevers or chills.  She started taking vicodin about two weeks ago (for leg pain, back pain). She really has only take one pill of vicodin two weeks prior to the pains starting.  had blood drawn just prior to this appt, not back yet.           Current Medications (verified): 1)  Toprol Xl 100 Mg Tb24 (Metoprolol Succinate) .... Take 1 Tablet By Mouth Once A Day 2)  Neurontin 300 Mg Caps (Gabapentin) .... Take 2  Tablets By Mouth 3 Times A Day 3)  Norvasc 10 Mg Tabs (Amlodipine Besylate) .... Take 1 Tablet By Mouth Once A Day 4)  Nitroglycerin 0.4 Mg Subl (Nitroglycerin) .... 1 Tablet By Mouth Every 5 Minutes P.r.n. Times 3 For Chest Pain 5)  Simvastatin 40 Mg Tabs (Simvastatin) .... Take 1 Tablet By Mouth At Bedtime 6)  Tylenol .... P.r.n. 7)  Hydrochlorothiazide 25 Mg Tabs (Hydrochlorothiazide) .... Take 1 Tablet By Mouth Once A Day 8)  Pletal 100 Mg Tabs (Cilostazol) .... Take 1 Tablet By Mouth Two Times A Day 9)  Adult Aspirin Ec Low Strength 81 Mg  Tbec (Aspirin) .... Take 1 Tablet By Mouth Once A Day 10)  Omeprazole 20 Mg Tbec (Omeprazole) .... Take 1 Tablet By Mouth Two Times A Day 11)  Vicodin Hp  10-660 Mg Tabs (Hydrocodone-Acetaminophen) .... Take One Tab By Mouth Once Every 4 To 6 Hours As Needed For Pain 12)  Voltaren 1 % Gel (Diclofenac Sodium) .... Apply Thin Layer of Gel Over Affected Area Four Times A Day For A Week  Allergies (verified): 1)  ! Sulfa  Past History:  Past Surgical History: cholecystectomy 2007, laparoscopically  Vital Signs:  Patient profile:   59 year old female Height:      62 inches Weight:      197.13 pounds BMI:     36 .19 Pulse rate:   72 / minute Pulse rhythm:   regular BP sitting:   142 / 92  (left arm)  Vitals Entered By: Milford Cage NCMA (April 20, 2010 1:52 PM)  Physical Exam  Additional Exam:  Constitutional: generally well appearing Psychiatric: alert and oriented times 3 Abdomen: soft,mild to moderately tender in upper epigastrium, no peritoneal signs  non-distended, normal bowel sounds    Impression & Recommendations:  Problem # 1:  abdominal pain, subacute she has had abdominal pains for the past 5-6 days. Associated with nausea worse after eating. Perhaps she has peptic ulcer disease. She does not take NSAIDs. Possibly gastritis related, [place] GERD related. She is m a bit tender on examination and so I want to get a CT scan first. She had labs drawn prior to this visit and those are not  back yet. I'll give her samples ofdexilant that she is to use in place of her omeprazole for now. If labs and CAT scan are not helpful then she would likely need an upper endoscopy.  Patient Instructions: 1)  You will be scheduled for a CT scan of abdomen and pelvis with IV and oral contrast. 2)  Await today's lab results. 3)  If no obvious cause, will probably need EGD with Dr. Marina Goodell. 4)  Samples of dexilant given, take one a day.  Can stop the omeprazole for now. 5)  If symptoms significantly worsen, you should go to the ER. 6)  The medication list was reviewed and reconciled.  All changed / newly prescribed medications were explained.   A complete medication list was provided to the patient / caregiver.  Appended Document: Orders Update/CT    Clinical Lists Changes  Problems: Added new problem of ABDOMINAL PAIN, EPIGASTRIC (ICD-789.06) Medications: Added new medication of DEXILANT 60 MG CPDR (DEXLANSOPRAZOLE) 1 by mouth once daily Orders: Added new Referral order of CT Abdomen/Pelvis with Contrast (CT Abd/Pelvis w/con) - Signed

## 2010-09-13 NOTE — Progress Notes (Signed)
Summary: refill/gg  Phone Note Refill Request  on October 19, 2009 2:32 PM  Refills Requested: Medication #1:  OMEPRAZOLE 20 MG TBEC Take 1 tablet by mouth two times a day   Last Refilled: 09/09/2009  Method Requested: Electronic Initial call taken by: Merrie Roof RN,  October 19, 2009 2:32 PM  Follow-up for Phone Call        refill done on opc visit.

## 2010-09-13 NOTE — Op Note (Signed)
Summary: EGD with Biopsies                    [facility]  Patient:    [name], [name]                          MRN: [id number] Proc. Date: 05/19/00 Adm. Date:  [id number] Disc. Date: [id number] Attending:  Phifer, Trinna Post CC:         Everardo Beals. Juanda Chance, M.D. [facility]  Alvester Morin, M.D.   Procedure Report  PROCEDURE PERFORMED:  Esophagogastroduodenoscopy with biopsies.  ENDOSCOPIST:  [name]. Eda Keys., M.D. [facility]  INDICATIONS FOR PROCEDURE:  Chest pain and reflux disease.  HISTORY:  The patient is a 60 year old black female who was admitted to the hospital yesterday with a three-day history of chest pain.  She was felt to have noncardiac origin.  She was seen in consultation by Dr. Lina Sar. Pain was felt to be musculoskeletal.  The patient does have a long history of reflux symptoms.  She is now for upper endoscopy. The nature of the procedure as well as the risks, benefits and alternatives were reviewed.  She understood and agreed to proceed.  PHYSICAL EXAMINATION:  The patient is a well-appearing female in no acute distress.  She is alert and oriented.  Vital signs are stable.  Lungs are clear.  Heart is regular.  Left superior chest wall was tender to palpation. Abdomen is benign.  DESCRIPTION OF PROCEDURE:  After informed consent was obtained, the patient was sedated with 50 mg of Demerol and 6 mg of Versed IV.  The Olympus endoscope was passed orally under direct vision into the esophagus.  The distal esophagus revealed a benign large caliber fibrous stricture.  No associated esophagitis.  The stomach revealed a sliding hiatal hernia.  In addition, multiple antral erosions as well as one superficial ulcer also located in the gastric antrum.  Biopsies for CLO testing were obtained.  The duodenal bulb and postbulbar duodenum were normal.  IMPRESSION: 1. Gastroesophageal reflux disease with peptic stricture. 2. Erosive antral mucosal disease.  Rule out  Helicobacter pylori versus    nonsteroidal anti-inflammatory drug induced lesion.  RECOMMENDATIONS: 1. Follow up CLO biopsy and treat if positive. 2. Continue Protonix 40 mg p.o. q.d. 3. Reflux precautions. 4. GI follow-up p.r.n. DD:  05/19/00 TD:  05/20/00 Job: [id number]

## 2010-09-13 NOTE — Procedures (Signed)
Summary: Colonoscopy  Patient Name: [name], [name] MRN: [id number] Procedure Procedures: Colonoscopy CPT: 91478.    with polypectomy. CPT: A3573898.  Personnel: Endoscopist: [name]. Marina Goodell, MD.  Exam Location: Exam performed in Outpatient Clinic. Outpatient  Patient Consent: Procedure, Alternatives, Risks and Benefits discussed, consent obtained, from patient. Consent was obtained by the RN.  Indications  Average Risk Screening Routine.  History  Pre-Exam Physical: Performed Jul 22, 2002. Entire physical exam was normal.  Exam Exam: Extent of exam reached: Cecum, extent intended: Cecum.  The cecum was identified by appendiceal orifice and IC valve. Patient position: on left side. Colon retroflexion performed. Images taken. ASA Classification: II. Tolerance: excellent.  Monitoring: Pulse and BP monitoring, Oximetry used. Supplemental O2 given.  Colon Prep Used Golytely for colon prep. Prep results: excellent.  Sedation Meds: Patient assessed and found to be appropriate for moderate (conscious) sedation. Fentanyl 100 mcg. given IV. Versed 5 mg. given IV.  Findings POLYP: Transverse Colon, Maximum size: 4 mm. pedunculated polyp. Procedure:  snare with cautery, removed, retrieved, Polyp sent to pathology. ICD9: Colon Polyps: 211.3.  NORMAL EXAM: Cecum to Rectum. Comments: internal hemorrhoids present.  POLYP: Sigmoid Colon, Maximum size: 3 mm. sessile polyp. Procedure:  snare with cautery, removed, retrieved, sent to pathology. ICD9: Colon Polyps: 211.3.  - DIVERTICULOSIS: Sigmoid Colon. ICD9: Diverticulosis, Colon: 562.10.   Assessment Abnormal examination, see findings above.  Diagnoses: 562.10: Diverticulosis, Colon.  211.3: Colon Polyps.  455.0: Hemorrhoids, Internal.   Events  Unplanned Interventions: No intervention was required.  Unplanned Events: There were no complications. Plans Patient Education: Patient given standard instructions for: Polyps.    Disposition: After procedure patient sent to recovery. After recovery patient sent home.  Scheduling/Referral: Colonoscopy, to [name]. Marina Goodell, MD, in 3 years if polyp adenomatous; otherwise 5 years.,    This report was created from the original endoscopy report, which was reviewed and signed by the above listed endoscopist. '  cc:  Duke Salvia, NP      Lewayne Bunting, MD

## 2010-09-19 ENCOUNTER — Emergency Department (HOSPITAL_COMMUNITY)
Admission: EM | Admit: 2010-09-19 | Discharge: 2010-09-19 | Disposition: A | Discharge status: Other Acute Inpatient Hospital | Payer: Medicare Other | Source: Home / Self Care | Attending: Emergency Medicine | Admitting: Emergency Medicine

## 2010-09-19 ENCOUNTER — Emergency Department (HOSPITAL_COMMUNITY): Payer: Medicare Other

## 2010-09-19 ENCOUNTER — Encounter: Payer: Self-pay | Admitting: Internal Medicine

## 2010-09-19 ENCOUNTER — Inpatient Hospital Stay (HOSPITAL_COMMUNITY)
Admission: EM | Admit: 2010-09-19 | Discharge: 2010-10-05 | DRG: 435 | Disposition: A | Discharge status: Home / Self Care | Payer: Medicare Other | Attending: Internal Medicine | Admitting: Internal Medicine

## 2010-09-19 DIAGNOSIS — R350 Frequency of micturition: Secondary | ICD-10-CM | POA: Diagnosis present

## 2010-09-19 DIAGNOSIS — E789 Disorder of lipoprotein metabolism, unspecified: Secondary | ICD-10-CM | POA: Diagnosis present

## 2010-09-19 DIAGNOSIS — Z79899 Other long term (current) drug therapy: Secondary | ICD-10-CM | POA: Insufficient documentation

## 2010-09-19 DIAGNOSIS — R1013 Epigastric pain: Secondary | ICD-10-CM | POA: Insufficient documentation

## 2010-09-19 DIAGNOSIS — K859 Acute pancreatitis without necrosis or infection, unspecified: Secondary | ICD-10-CM | POA: Diagnosis present

## 2010-09-19 DIAGNOSIS — Z9889 Other specified postprocedural states: Secondary | ICD-10-CM | POA: Insufficient documentation

## 2010-09-19 DIAGNOSIS — G589 Mononeuropathy, unspecified: Secondary | ICD-10-CM | POA: Diagnosis present

## 2010-09-19 DIAGNOSIS — R17 Unspecified jaundice: Secondary | ICD-10-CM | POA: Insufficient documentation

## 2010-09-19 DIAGNOSIS — G894 Chronic pain syndrome: Secondary | ICD-10-CM | POA: Diagnosis present

## 2010-09-19 DIAGNOSIS — R112 Nausea with vomiting, unspecified: Secondary | ICD-10-CM | POA: Insufficient documentation

## 2010-09-19 DIAGNOSIS — I252 Old myocardial infarction: Secondary | ICD-10-CM | POA: Insufficient documentation

## 2010-09-19 DIAGNOSIS — I959 Hypotension, unspecified: Secondary | ICD-10-CM | POA: Diagnosis present

## 2010-09-19 DIAGNOSIS — C240 Malignant neoplasm of extrahepatic bile duct: Principal | ICD-10-CM | POA: Diagnosis present

## 2010-09-19 DIAGNOSIS — E119 Type 2 diabetes mellitus without complications: Secondary | ICD-10-CM | POA: Diagnosis present

## 2010-09-19 DIAGNOSIS — I2510 Atherosclerotic heart disease of native coronary artery without angina pectoris: Secondary | ICD-10-CM | POA: Diagnosis present

## 2010-09-19 DIAGNOSIS — M129 Arthropathy, unspecified: Secondary | ICD-10-CM | POA: Insufficient documentation

## 2010-09-19 DIAGNOSIS — I739 Peripheral vascular disease, unspecified: Secondary | ICD-10-CM | POA: Diagnosis present

## 2010-09-19 DIAGNOSIS — K219 Gastro-esophageal reflux disease without esophagitis: Secondary | ICD-10-CM | POA: Diagnosis present

## 2010-09-19 DIAGNOSIS — N12 Tubulo-interstitial nephritis, not specified as acute or chronic: Secondary | ICD-10-CM | POA: Insufficient documentation

## 2010-09-19 DIAGNOSIS — I10 Essential (primary) hypertension: Secondary | ICD-10-CM | POA: Diagnosis present

## 2010-09-19 DIAGNOSIS — D72829 Elevated white blood cell count, unspecified: Secondary | ICD-10-CM | POA: Diagnosis present

## 2010-09-19 DIAGNOSIS — N390 Urinary tract infection, site not specified: Secondary | ICD-10-CM | POA: Diagnosis present

## 2010-09-19 DIAGNOSIS — E876 Hypokalemia: Secondary | ICD-10-CM | POA: Diagnosis present

## 2010-09-19 DIAGNOSIS — K5900 Constipation, unspecified: Secondary | ICD-10-CM | POA: Diagnosis present

## 2010-09-19 DIAGNOSIS — K759 Inflammatory liver disease, unspecified: Secondary | ICD-10-CM | POA: Diagnosis present

## 2010-09-19 DIAGNOSIS — K838 Other specified diseases of biliary tract: Secondary | ICD-10-CM | POA: Diagnosis present

## 2010-09-19 HISTORY — DX: Essential (primary) hypertension: I10

## 2010-09-19 LAB — POCT CARDIAC MARKERS
CKMB, poc: <1 ng/mL — ABNORMAL LOW (ref 1.0–8.0)
Myoglobin, poc: 71.5 ng/mL (ref 12–200)

## 2010-09-19 LAB — URINALYSIS, ROUTINE W REFLEX MICROSCOPIC
Nitrite: POSITIVE — AB
Protein, ur: NEGATIVE mg/dL
Urobilinogen, UA: 1 mg/dL (ref 0.0–1.0)

## 2010-09-19 LAB — CBC
HCT: 37.3 % (ref 36.0–46.0)
MCH: 29.7 pg (ref 26.0–34.0)
MCV: 88 fL (ref 78.0–100.0)
Platelets: 271 10*3/uL (ref 150–400)
RBC: 4.24 MIL/uL (ref 3.87–5.11)

## 2010-09-19 LAB — DIFFERENTIAL
Eosinophils Absolute: 0.1 10*3/uL (ref 0.0–0.7)
Lymphs Abs: 1.6 10*3/uL (ref 0.7–4.0)
Monocytes Relative: 9 % (ref 3–12)
Neutro Abs: 6.1 10*3/uL (ref 1.7–7.7)
Neutrophils Relative %: 71 % (ref 43–77)

## 2010-09-19 LAB — ACETAMINOPHEN LEVEL: Acetaminophen (Tylenol), Serum: <10 ug/mL — ABNORMAL LOW (ref 10–30)

## 2010-09-19 LAB — LIPASE, BLOOD: Lipase: 32 U/L (ref 11–59)

## 2010-09-19 LAB — COMPREHENSIVE METABOLIC PANEL
Albumin: 3.4 g/dL — ABNORMAL LOW (ref 3.5–5.2)
Alkaline Phosphatase: 455 U/L — ABNORMAL HIGH (ref 39–117)
BUN: 10 mg/dL (ref 6–23)
CO2: 25 mEq/L (ref 19–32)
Chloride: 107 mEq/L (ref 96–112)
Creatinine, Ser: 0.77 mg/dL (ref 0.4–1.2)
GFR calc non Af Amer: >60 mL/min (ref >60)
Glucose, Bld: 100 mg/dL — ABNORMAL HIGH (ref 70–99)
Potassium: 3.5 mEq/L (ref 3.5–5.1)
Total Bilirubin: 5.6 mg/dL — ABNORMAL HIGH (ref 0.3–1.2)

## 2010-09-19 LAB — URINE MICROSCOPIC-ADD ON

## 2010-09-19 MED ORDER — IOHEXOL 300 MG/ML  SOLN
100.0000 mL | Freq: Once | INTRAMUSCULAR | Status: AC | PRN
  Administered 2010-09-19: 100 mL via INTRAVENOUS

## 2010-09-20 ENCOUNTER — Inpatient Hospital Stay (HOSPITAL_COMMUNITY): Payer: Medicare Other

## 2010-09-20 DIAGNOSIS — R112 Nausea with vomiting, unspecified: Secondary | ICD-10-CM

## 2010-09-20 DIAGNOSIS — R197 Diarrhea, unspecified: Secondary | ICD-10-CM

## 2010-09-20 LAB — RAPID URINE DRUG SCREEN, HOSP PERFORMED
Amphetamines: NOT DETECTED
Barbiturates: NOT DETECTED
Benzodiazepines: NOT DETECTED
Opiates: POSITIVE — AB

## 2010-09-20 LAB — HEPATITIS B SURFACE ANTIGEN: Hepatitis B Surface Ag: NEGATIVE

## 2010-09-20 LAB — HIV ANTIBODY (ROUTINE TESTING W REFLEX): HIV: NONREACTIVE

## 2010-09-20 LAB — CARDIAC PANEL(CRET KIN+CKTOT+MB+TROPI)
CK, MB: 0.6 ng/mL (ref 0.3–4.0)
Relative Index: INVALID (ref 0.0–2.5)
Relative Index: INVALID (ref 0.0–2.5)
Total CK: 30 U/L (ref 7–177)
Total CK: 32 U/L (ref 7–177)
Troponin I: 0.02 ng/mL (ref 0.00–0.06)

## 2010-09-20 LAB — TROPONIN I: Troponin I: 0.01 ng/mL (ref 0.00–0.06)

## 2010-09-20 LAB — COMPREHENSIVE METABOLIC PANEL
BUN: 4 mg/dL — ABNORMAL LOW (ref 6–23)
CO2: 28 mEq/L (ref 19–32)
Calcium: 9.1 mg/dL (ref 8.4–10.5)
Creatinine, Ser: 0.8 mg/dL (ref 0.4–1.2)
GFR calc non Af Amer: >60 mL/min (ref >60)
Glucose, Bld: 95 mg/dL (ref 70–99)
Total Protein: 6 g/dL (ref 6.0–8.3)

## 2010-09-20 LAB — CBC
HCT: 33.9 % — ABNORMAL LOW (ref 36.0–46.0)
Hemoglobin: 11.2 g/dL — ABNORMAL LOW (ref 12.0–15.0)
MCHC: 33 g/dL (ref 30.0–36.0)
RDW: 14 % (ref 11.5–15.5)
WBC: 7.1 10*3/uL (ref 4.0–10.5)

## 2010-09-20 LAB — CK TOTAL AND CKMB (NOT AT ARMC): Relative Index: INVALID (ref 0.0–2.5)

## 2010-09-20 LAB — LACTATE DEHYDROGENASE: LDH: 139 U/L (ref 94–250)

## 2010-09-20 LAB — BILIRUBIN, FRACTIONATED(TOT/DIR/INDIR)
Bilirubin, Direct: 3.2 mg/dL — ABNORMAL HIGH (ref 0.0–0.3)
Indirect Bilirubin: 2 mg/dL — ABNORMAL HIGH (ref 0.3–0.9)

## 2010-09-20 LAB — HEPATITIS PANEL, ACUTE
HCV Ab: NEGATIVE
Hep A IgM: NEGATIVE
Hep B C IgM: NEGATIVE
Hepatitis B Surface Ag: NEGATIVE

## 2010-09-20 LAB — PROTIME-INR: Prothrombin Time: 12.8 seconds (ref 11.6–15.2)

## 2010-09-20 LAB — GAMMA GT: GGT: 995 U/L — ABNORMAL HIGH (ref 7–51)

## 2010-09-21 LAB — URINE CULTURE

## 2010-09-21 LAB — COMPREHENSIVE METABOLIC PANEL
ALT: 211 U/L — ABNORMAL HIGH (ref 0–35)
AST: 146 U/L — ABNORMAL HIGH (ref 0–37)
Albumin: 2.7 g/dL — ABNORMAL LOW (ref 3.5–5.2)
Alkaline Phosphatase: 423 U/L — ABNORMAL HIGH (ref 39–117)
Chloride: 106 mEq/L (ref 96–112)
GFR calc Af Amer: >60 mL/min (ref >60)
Potassium: 3.5 mEq/L (ref 3.5–5.1)
Total Bilirubin: 5.3 mg/dL — ABNORMAL HIGH (ref 0.3–1.2)
Total Protein: 6.2 g/dL (ref 6.0–8.3)

## 2010-09-21 LAB — EPSTEIN-BARR VIRUS VCA, IGM: EBV VCA IgM: 0.12 {ISR}

## 2010-09-21 LAB — HEPATITIS PANEL, ACUTE: Hep A IgM: NEGATIVE

## 2010-09-21 LAB — EPSTEIN-BARR VIRUS VCA, IGG: EBV VCA IgG: 2.98 {ISR} — ABNORMAL HIGH

## 2010-09-22 LAB — COMPREHENSIVE METABOLIC PANEL
Albumin: 2.9 g/dL — ABNORMAL LOW (ref 3.5–5.2)
Alkaline Phosphatase: 443 U/L — ABNORMAL HIGH (ref 39–117)
BUN: 3 mg/dL — ABNORMAL LOW (ref 6–23)
CO2: 28 mEq/L (ref 19–32)
Chloride: 106 mEq/L (ref 96–112)
GFR calc non Af Amer: >60 mL/min (ref >60)
Glucose, Bld: 130 mg/dL — ABNORMAL HIGH (ref 70–99)
Potassium: 3.3 mEq/L — ABNORMAL LOW (ref 3.5–5.1)
Total Bilirubin: 5.9 mg/dL — ABNORMAL HIGH (ref 0.3–1.2)

## 2010-09-22 LAB — IRON AND TIBC
Iron: 83 ug/dL (ref 42–135)
Saturation Ratios: 24 % (ref 20–55)
TIBC: 340 ug/dL (ref 250–470)
UIBC: 257 ug/dL

## 2010-09-23 LAB — CBC
Hemoglobin: 10 g/dL — ABNORMAL LOW (ref 12.0–15.0)
MCH: 29.5 pg (ref 26.0–34.0)
MCV: 87.3 fL (ref 78.0–100.0)
Platelets: 263 10*3/uL (ref 150–400)
RBC: 3.39 MIL/uL — ABNORMAL LOW (ref 3.87–5.11)

## 2010-09-23 LAB — COMPREHENSIVE METABOLIC PANEL
BUN: 3 mg/dL — ABNORMAL LOW (ref 6–23)
CO2: 27 mEq/L (ref 19–32)
Chloride: 106 mEq/L (ref 96–112)
Creatinine, Ser: 0.64 mg/dL (ref 0.4–1.2)
GFR calc non Af Amer: >60 mL/min (ref >60)
Total Bilirubin: 6 mg/dL — ABNORMAL HIGH (ref 0.3–1.2)

## 2010-09-24 ENCOUNTER — Ambulatory Visit: Payer: Medicare Other | Admitting: Ophthalmology

## 2010-09-24 DIAGNOSIS — R197 Diarrhea, unspecified: Secondary | ICD-10-CM

## 2010-09-24 DIAGNOSIS — R112 Nausea with vomiting, unspecified: Secondary | ICD-10-CM

## 2010-09-24 DIAGNOSIS — R1013 Epigastric pain: Secondary | ICD-10-CM

## 2010-09-24 DIAGNOSIS — R740 Nonspecific elevation of levels of transaminase and lactic acid dehydrogenase [LDH]: Secondary | ICD-10-CM

## 2010-09-24 LAB — COMPREHENSIVE METABOLIC PANEL
ALT: 219 U/L — ABNORMAL HIGH (ref 0–35)
AST: 150 U/L — ABNORMAL HIGH (ref 0–37)
Calcium: 9.3 mg/dL (ref 8.4–10.5)
GFR calc Af Amer: >60 mL/min (ref >60)
Sodium: 139 mEq/L (ref 135–145)
Total Protein: 6.6 g/dL (ref 6.0–8.3)

## 2010-09-25 ENCOUNTER — Inpatient Hospital Stay (HOSPITAL_COMMUNITY): Payer: Medicare Other

## 2010-09-25 DIAGNOSIS — R740 Nonspecific elevation of levels of transaminase and lactic acid dehydrogenase [LDH]: Secondary | ICD-10-CM

## 2010-09-25 DIAGNOSIS — R1013 Epigastric pain: Secondary | ICD-10-CM

## 2010-09-25 DIAGNOSIS — R932 Abnormal findings on diagnostic imaging of liver and biliary tract: Secondary | ICD-10-CM

## 2010-09-25 DIAGNOSIS — R112 Nausea with vomiting, unspecified: Secondary | ICD-10-CM

## 2010-09-25 LAB — PROTIME-INR: INR: 0.94 (ref 0.00–1.49)

## 2010-09-25 LAB — HEPATIC FUNCTION PANEL
ALT: 216 U/L — ABNORMAL HIGH (ref 0–35)
AST: 156 U/L — ABNORMAL HIGH (ref 0–37)
Alkaline Phosphatase: 457 U/L — ABNORMAL HIGH (ref 39–117)
Bilirubin, Direct: 4.4 mg/dL — ABNORMAL HIGH (ref 0.0–0.3)
Total Bilirubin: 7.3 mg/dL — ABNORMAL HIGH (ref 0.3–1.2)

## 2010-09-25 MED ORDER — GADOBENATE DIMEGLUMINE 529 MG/ML IV SOLN
18.0000 mL | Freq: Once | INTRAVENOUS | Status: AC
  Administered 2010-09-25: 18 mL via INTRAVENOUS

## 2010-09-26 ENCOUNTER — Other Ambulatory Visit: Payer: Self-pay | Admitting: Internal Medicine

## 2010-09-26 ENCOUNTER — Inpatient Hospital Stay (HOSPITAL_COMMUNITY): Payer: Medicare Other

## 2010-09-26 DIAGNOSIS — R932 Abnormal findings on diagnostic imaging of liver and biliary tract: Secondary | ICD-10-CM

## 2010-09-26 DIAGNOSIS — R197 Diarrhea, unspecified: Secondary | ICD-10-CM

## 2010-09-26 DIAGNOSIS — C221 Intrahepatic bile duct carcinoma: Secondary | ICD-10-CM

## 2010-09-26 DIAGNOSIS — R112 Nausea with vomiting, unspecified: Secondary | ICD-10-CM

## 2010-09-26 DIAGNOSIS — K831 Obstruction of bile duct: Secondary | ICD-10-CM

## 2010-09-26 LAB — ANTI-SMOOTH MUSCLE ANTIBODY, IGG: F-Actin IgG: <20

## 2010-09-27 DIAGNOSIS — R932 Abnormal findings on diagnostic imaging of liver and biliary tract: Secondary | ICD-10-CM

## 2010-09-27 DIAGNOSIS — C240 Malignant neoplasm of extrahepatic bile duct: Secondary | ICD-10-CM

## 2010-09-27 DIAGNOSIS — R1013 Epigastric pain: Secondary | ICD-10-CM

## 2010-09-27 DIAGNOSIS — K831 Obstruction of bile duct: Secondary | ICD-10-CM

## 2010-09-27 DIAGNOSIS — K859 Acute pancreatitis without necrosis or infection, unspecified: Secondary | ICD-10-CM

## 2010-09-27 LAB — CBC
Hemoglobin: 12 g/dL (ref 12.0–15.0)
MCH: 29.7 pg (ref 26.0–34.0)
MCHC: 33.9 g/dL (ref 30.0–36.0)
MCV: 87.6 fL (ref 78.0–100.0)
RBC: 4.04 MIL/uL (ref 3.87–5.11)
RDW: 15.5 % (ref 11.5–15.5)
WBC: 13.6 10*3/uL — ABNORMAL HIGH (ref 4.0–10.5)

## 2010-09-27 LAB — COMPREHENSIVE METABOLIC PANEL
ALT: 158 U/L — ABNORMAL HIGH (ref 0–35)
Calcium: 9.2 mg/dL (ref 8.4–10.5)
Creatinine, Ser: 0.81 mg/dL (ref 0.4–1.2)
GFR calc Af Amer: >60 mL/min (ref >60)
GFR calc non Af Amer: >60 mL/min (ref >60)
Glucose, Bld: 236 mg/dL — ABNORMAL HIGH (ref 70–99)
Sodium: 135 mEq/L (ref 135–145)
Total Protein: 6.5 g/dL (ref 6.0–8.3)

## 2010-09-27 LAB — CANCER ANTIGEN 19-9: CA 19-9: 678 U/mL — ABNORMAL HIGH (ref <35.0)

## 2010-09-27 NOTE — Miscellaneous (Signed)
Summary: Hospital Admission  INTERNAL MEDICINE ADMISSION HISTORY AND PHYSICAL ***Place in progress notes section of chart***  Attending:Dr. Josem Kaufmann 1st contact: Dr. Odis Luster [phone] 2nd contact: Dr. Gilford Rile [phone] Weekends, [place], After 5pm Weekdays: 1st contact: [phone] 2nd contact: [phone]   PCP: Dr. Laren Everts  CC: Darkening of the urine, nausea/vomiting  HPI: Patient is a 60 year old female with a PMH significant for hiatal hernia, HTN, HLD, GERD, depression, and history of non-obstructive CAD who presented to the [facility] ED with complaints of darkening urine since 09/14/10.  She reports a hx nausea, vomiting, and diarrhea that started 3 weeks ago but has resolved since with the last episode of emesis 5 days ago and the last diarrhea 7 days ago.  She felt like she was getting better and then on Friday 09/14/10 started to notice that her urine was getting dark.  She denies fevers, chills, diaphoresis, or blood in the stools.  She has been more fatigued lately and reports a 10 lb weight loss in the last month or so without trying to lose weight.  She denies any dysuria or urinary urgency but has been going more frequently.  She also states that she cares for her grandchildren during the day sometimes and several of them have been sick as well with symptoms of nausea/vomiting.    She also states that she has had chest pain on and off for the past 2-3 weeks.  She had a cath done recently in August and "a light heart attack" in the past.  The pain is an ache in her central chest that does not radiate.  It does not correlate with her meals or increased activity and usually goes away on its own without treatment in 5-10 minutes.    ALLERGIES: ! 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 times 3 for chest pain SIMVASTATIN 40 MG TABS (SIMVASTATIN) Take 1 tablet by mouth at bedtime * TYLENOL p.r.n. HYDROCHLOROTHIAZIDE 25 MG TABS (HYDROCHLOROTHIAZIDE) Take 1 tablet by mouth once a day PLETAL 100 MG TABS (CILOSTAZOL) Take 1 tablet by mouth two times a day ADULT ASPIRIN EC LOW STRENGTH 81 MG  TBEC (ASPIRIN) Take 1 tablet by mouth once a day OMEPRAZOLE 20 MG TBEC (OMEPRAZOLE) Take 1 tablet by mouth two times a day VOLTAREN 1 % GEL (DICLOFENAC SODIUM) Apply thin layer of gel over affected area four times a day for a week DEXILANT 60 MG CPDR (DEXLANSOPRAZOLE) 1 by mouth once daily CARAFATE 1 GM/10ML SUSP (SUCRALFATE) 1 gram two times a day   SOCIAL HISTORY: Occupation: Retired Married Patient is a former smoker, quit  11 yrs ago.  Alcohol Use - no Illicit Drug Use - no   FAMILY HISTORY Family History: Sarcodosis: Sister Family History of Heart Disease: Brother Family History of Stomach Cancer:Mother?   ROS: as per HPI, all other systems reviewed and negative   VITALS: T: 98.8  P: 56  BP: 145/90  R: 20  O2SAT: 98%  ON: RA  PHYSICAL EXAM: General:  alert,  well-developed, NAD, cooperative, A&Ox3 Head:  normocephalic and atraumatic.   Eyes:  PERRL, EOMI, vision grossly intact, conjuctive and sclerae mildly icteric Mouth:  MMM, no erythema, no exudates, or lesions.   Neck:  supple, full ROM, trachea midline, no palp masses, no JVD, no carotid bruits.   Lungs:  CTAB, normal respiratory effort  Heart: RRR, no M/R/G Abdomen:  soft, obese, NT, ND, BS present and normoactive, no palpable masses, CVA tenderness on the left. Msk:  no joint swelling, warmth, or erythema  Neurologic:  CN II-XII intact,+5 strength globally, sensation grossly intact.   Skin:  turgor normal and no rashes.   Psych: memory intact for recent and remote, normally interactive, good eye contact, affect as expected   LABS:   Sodium (NA)                              141               135-145          mEq/L  Potassium (K)                            3.5               3.5-5.1          mEq/L  Chloride                                 107               96-112           mEq/L  CO2                                      25                19-32            mEq/L  Glucose                                  100        h      70-99            mg/dL  BUN                                      10                6-23             mg/dL  Creatinine  0.77              0.4-1.2          mg/dL  GFR, Est Non African American            >60               >60              mL/min  GFR, Est African American                >60               >60              mL/min    Oversized comment, see footnote  1  Bilirubin, Total                         5.6        h      0.3-1.2          mg/dL  Alkaline Phosphatase                     455        h      39-117           U/L  SGOT (AST)                               141        h      0-37             U/L  SGPT (ALT)                               285        h      0-35             U/L  Total  Protein                           7.3                6.0-8.3          g/dL  Albumin-Blood                            3.4        l      3.5-5.2          g/dL  Calcium                                  9.5               8.4-10.5         mg/dL   WBC                                      8.5               4.0-10.5         K/uL  RBC  4.24              3.87-5.11        MIL/uL  Hemoglobin (HGB)                         12.6              12.0-15.0        g/dL  Hematocrit (HCT)                         37.3              36.0-46.0        %  MCV                                      88.0              78.0-100.0       fL  MCH -                                    29.7              26.0-34.0        pg  MCHC                                     33.8              30.0-36.0        g/dL  RDW                                      13.8              11.5-15.5        %  Platelet Count (PLT)                     271               150-400          K/uL  Neutrophils, %                           71                43-77            %  Lymphocytes, %                           19                12-46            %  Monocytes, %                             9                 3-12             %  Eosinophils, %  1                 0-5              %  Basophils, %                             0                 0-1              %  Neutrophils, Absolute                    6.1               1.7-7.7          K/uL  Lymphocytes, Absolute                    1.6               0.7-4.0          K/uL  Monocytes, Absolute                      0.8               0.1-1.0          K/uL  Eosinophils, Absolute                    0.1               0.0-0.7          K/uL  Basophils, Absolute                      0.0               0.0-0.1          K/uL   Lipase                                   32                11-59            U/L  ACTMN                                    <10.0      l      10-30            ug/mL   CKMB, POC                                 <1.0       l      1.0-8.0          ng/mL  Troponin I, POC                          <0.05             0.00-0.09        ng/mL  Myoglobin, POC  71.5              12-200           ng/mL   Color, Urine                             ORANGE     a      YELLOW    BIOCHEMICALS MAY BE AFFECTED BY COLOR  Appearance                               CLOUDY     a      CLEAR  Specific Gravity                         1.031      h      1.005-1.030  pH                                       6.0               5.0-8.0  Urine Glucose                            NEGATIVE          NEG              mg/dL  Bilirubin                                LARGE      a      NEG  Ketones                                  TRACE      a      NEG              mg/dL  Blood                                    NEGATIVE          NEG  Protein                                  NEGATIVE          NEG              mg/dL  Urobilinogen                             1.0               0.0-1.0          mg/dL  Nitrite                                  POSITIVE   a      NEG  Leukocytes  SMALL      a      NEG  Squamous Epithelial / LPF                MANY       a      RARE  Casts / HPF                              SEE NOTE.  a      NEG    HYALINE CASTS  WBC / HPF                                7-10              <3               WBC/hpf  RBC / HPF                                3-6               <3               RBC/hpf  Bacteria / HPF                           MANY       a      RARE  Urine-Other                              SEE NOTE.    MUCOUS PRESENT   IMAGING:  CT of the abdomen and pelvis   Findings: The liver, spleen, pancreas and kidneys are normal.  Bilateral adrenal adenomas are stable.  There is no adenopathy, free air, free fluid, or other abnormality.  The uterus and ovaries and appendix and terminal ileum are all   normal.  No dilated bowel or diverticular disease.  No significant   osseous abnormality.  Abdominal ultrasound: Gallbladder: Removed,  Common bile duct:  Normal.  6.1 mm in diameter.  Liver:  There is slight prominence of the intrahepatic ducts but the liver parenchyma is normal. IVC:  Appears normal. Pancreas:   The distal tail of the pancreas is obscured by bowel gas.  The remainder of the pancreas is normal.  Spleen:  Normal.  6.5 cm in length.  Right Kidney:  Normal.  11.8 cm in length.  Left Kidney:  Normal.  10.9 cm in length.  Abdominal aorta:  Normal.  2.2 cm in diameter.  The distal aorta is obscured by bowel gas.  Slight prominence of the intrahepatic ducts as seen on the CT scan of the same date.  Common bile duct is normal in diameter.  This is most likely physiologic.  Is the patient's bilirubin elevated?  ASSESSMENT AND PLAN: 1. Acute liver injury:  The patient's liver enzymes are elevated with increased bilirubin, alk phos, AST, and ALT.  AST and ALT are more elevated compared to ALK so this is likely a hepatic process.  CT scan and abdominal ultrasound were remarkable for slight prominence of the intrahepatic duct but no sign of obstruction.  She is relatively stable at this time so we will proceed with work up of this.   -  Admit to telemetry   - Fractionated bilirubin, acute hepatitis panel, PTINR, HIV acreen, UDS, ETOH level and a TSH   - Hold statin   - Will use oxycodone for pain control and avoid any tylenol in light of elevated LFTs  2. Chest pain:  Possible DDx include ACS, GERD, musculoskeletal, or anxiety, most likely related to her GERD.  EKG is unchanged from previous.     - Cardiac enzymes q8 x3    - Chest x-ray to evaluate for pulmonary causes   - Continue Protonix and carafate   - Continue ASA and beta-blocker  3. Urinary frequency:  UA showed small leukocytes, positive nitrates and many bacteria but also many squamous cells so likely not a clean catch.  With her complaints of urinary frequency, CVA tenderness, and the nitrate positive  we will treat for presumed pyelonephritis.   - Cipro 500 mg two times a day for 7 days.    - Will send urine for culture  4. HTN:  With her acute liver injury we will hold her HCTZ and adjust her norvasc for now.     - Norvasc 2.5 mg daily (adjusted for hepatic dysfunction   - Toprol XL 100 mg daily   - Hydralazine 10 mg IV q6 as needed for SBP >170  5. PVD:  We will continue her on her Pletal 100 mg two times a day and ASA.  6. VTE PROPH: Lovenox  ATTENDING: I performed and/or observed a history and physical examination of the patient.  I discussed the case with the residents as noted and reviewed the residents' notes.  I agree with the findings and plan--please refer to the attending physician note for more details.  Signature________________________________  Printed Name_____________________________

## 2010-09-28 ENCOUNTER — Encounter: Payer: Self-pay | Admitting: Cardiovascular Disease

## 2010-09-28 DIAGNOSIS — C221 Intrahepatic bile duct carcinoma: Secondary | ICD-10-CM

## 2010-09-28 LAB — COMPREHENSIVE METABOLIC PANEL
ALT: 95 U/L — ABNORMAL HIGH (ref 0–35)
AST: 41 U/L — ABNORMAL HIGH (ref 0–37)
Albumin: 2.2 g/dL — ABNORMAL LOW (ref 3.5–5.2)
Alkaline Phosphatase: 297 U/L — ABNORMAL HIGH (ref 39–117)
CO2: 24 mEq/L (ref 19–32)
Chloride: 104 mEq/L (ref 96–112)
Creatinine, Ser: 1.39 mg/dL — ABNORMAL HIGH (ref 0.4–1.2)
GFR calc Af Amer: 47 mL/min — ABNORMAL LOW (ref >60)
GFR calc non Af Amer: 39 mL/min — ABNORMAL LOW (ref >60)
Potassium: 5.4 mEq/L — ABNORMAL HIGH (ref 3.5–5.1)
Sodium: 134 mEq/L — ABNORMAL LOW (ref 135–145)
Total Bilirubin: 3.1 mg/dL — ABNORMAL HIGH (ref 0.3–1.2)

## 2010-09-28 LAB — CBC
Hemoglobin: 9.7 g/dL — ABNORMAL LOW (ref 12.0–15.0)
MCH: 28.7 pg (ref 26.0–34.0)
Platelets: 342 10*3/uL (ref 150–400)
RBC: 3.38 MIL/uL — ABNORMAL LOW (ref 3.87–5.11)
WBC: 21.1 10*3/uL — ABNORMAL HIGH (ref 4.0–10.5)

## 2010-09-29 ENCOUNTER — Encounter: Payer: Self-pay | Admitting: Cardiovascular Disease

## 2010-09-29 LAB — COMPREHENSIVE METABOLIC PANEL
AST: 32 U/L (ref 0–37)
Albumin: 2.1 g/dL — ABNORMAL LOW (ref 3.5–5.2)
BUN: 6 mg/dL (ref 6–23)
Calcium: 8.8 mg/dL (ref 8.4–10.5)
Chloride: 106 mEq/L (ref 96–112)
Creatinine, Ser: 0.94 mg/dL (ref 0.4–1.2)
GFR calc Af Amer: >60 mL/min (ref >60)
GFR calc non Af Amer: >60 mL/min (ref >60)
Total Bilirubin: 3.2 mg/dL — ABNORMAL HIGH (ref 0.3–1.2)

## 2010-09-29 LAB — CBC
MCH: 28.7 pg (ref 26.0–34.0)
MCHC: 32.6 g/dL (ref 30.0–36.0)
MCV: 88 fL (ref 78.0–100.0)
Platelets: 327 10*3/uL (ref 150–400)
RBC: 3.24 MIL/uL — ABNORMAL LOW (ref 3.87–5.11)
RDW: 15.5 % (ref 11.5–15.5)

## 2010-09-29 LAB — PREALBUMIN: Prealbumin: 6 mg/dL — ABNORMAL LOW (ref 17.0–34.0)

## 2010-09-30 LAB — RENAL FUNCTION PANEL
Albumin: 1.9 g/dL — ABNORMAL LOW (ref 3.5–5.2)
CO2: 23 mEq/L (ref 19–32)
Chloride: 108 mEq/L (ref 96–112)
GFR calc Af Amer: >60 mL/min (ref >60)
GFR calc non Af Amer: >60 mL/min (ref >60)
Potassium: 3.3 mEq/L — ABNORMAL LOW (ref 3.5–5.1)
Sodium: 138 mEq/L (ref 135–145)

## 2010-09-30 LAB — HEPATIC FUNCTION PANEL
ALT: 51 U/L — ABNORMAL HIGH (ref 0–35)
AST: 26 U/L (ref 0–37)
Albumin: 1.9 g/dL — ABNORMAL LOW (ref 3.5–5.2)
Total Protein: 5.7 g/dL — ABNORMAL LOW (ref 6.0–8.3)

## 2010-09-30 LAB — DIFFERENTIAL
Basophils Relative: 0 % (ref 0–1)
Eosinophils Absolute: 0.1 10*3/uL (ref 0.0–0.7)
Lymphs Abs: 1.1 10*3/uL (ref 0.7–4.0)
Neutro Abs: 17.6 10*3/uL — ABNORMAL HIGH (ref 1.7–7.7)
Neutrophils Relative %: 87 % — ABNORMAL HIGH (ref 43–77)

## 2010-09-30 LAB — CBC
Hemoglobin: 8.9 g/dL — ABNORMAL LOW (ref 12.0–15.0)
MCH: 29.5 pg (ref 26.0–34.0)
Platelets: 360 10*3/uL (ref 150–400)
RBC: 3.02 MIL/uL — ABNORMAL LOW (ref 3.87–5.11)
WBC: 20.4 10*3/uL — ABNORMAL HIGH (ref 4.0–10.5)

## 2010-09-30 NOTE — Op Note (Addendum)
[name], [name]                 ACCOUNT NO.:  [id number]  MEDICAL RECORD NO.:  [id number]           PATIENT TYPE:  I  LOCATION:  6704                         FACILITY:  [facility]  PHYSICIAN:  [name]. Marina Goodell, MD      DATE OF BIRTH:  12/16/1950  DATE OF PROCEDURE:  09/26/2010 DATE OF DISCHARGE:                              OPERATIVE REPORT   SURGEON:  [name]. Marina Goodell, MD  PROCEDURE:  Endoscopic retrograde cholangiopancreatography with biliary sphincterotomy, bile duct stricture brushing, and biliary stent placement.  INDICATION:  Obstructive jaundice.  HISTORY:  This is a 60 year old female who was admitted to the hospital with jaundice.  She has been having intermittent vague abdominal pain. She has had 20-pound weight loss over the past 6 months.  She is status post cholecystectomy.  Initial imaging studies with ultrasound and CT scan revealed no definitive cause.  Causes for intrahepatic disease such as hepatitis have been excluded.  MRCP is strongly suggested, a common hepatic duct stricture with upstream dilation.  Some cholecystectomy clip-induced artifact with no obvious mass.  She is now for ERCP.  The nature of the procedure as well as the risks, benefits, and alternatives were reviewed in detail with the patient and her family prior to proceeding.  She understood and agreed to proceed.  Preprocedure antibiotics in the form of ciprofloxacin IV were administered.  After informed consent was obtained, the patient was sedated over the course with 125 mcg of fentanyl, 12.5 mg of Versed, and 25 mg Benadryl IV. Glucagon 1 mg IV was given as a duodenal relaxant.  A Pentax side- viewing endoscope was then passed blindly into the esophagus.  The stomach was carefully examined and appeared normal as did the duodenal bulb and postbulbar duodenum.  The major ampulla was normal.  The minor ampulla was not sought.  X-RAY FINDINGS: 1. Scout radiograph of the abdomen revealed  cholecystectomy clips. 2. Scout radiograph of the abdomen with the endoscope in position     revealed the same. 3. Initial injection of contrast via the major ampulla yielded a     normal partial pancreatogram.  Multiple attempts were made at accessing the common duct, predominantly with a wire-only technique, but were unsuccessful.  Subsequently, it was elected to place the guidewire into the pancreatic duct to assist with biliary cannulation.  This was done carefully as the distal duct was narrow and somewhat angulated.  However, in time, this was achieved without resistance.  Subsequently, on the initial try, a second guidewire passed into the biliary tree.  Initial injection of contrast revealed that the distal portion of the bile duct was normal.  However, beginning at the level of the cholecystectomy clips and proximal for about 2.5 cm was malignant appearing bile duct stricture.  Upstream dilation of the left liver biliary system was observed.  No significant contrast flowed into the right system obviously.  Next, a large biliary sphincterotomy was performed with cutting over the guidewire in the 12 o'clock orientation via the ERBE system.  The cutting catheter was then exchanged for a biliary brush.  The brush was moved  across the stricture multiple times.  The brush was then removed and the specimen submitted for pathologic analysis.  Brush material was bloody.  Finally, over the guidewire, a 12-cm in length, 10-French, plastic biliary endoprosthesis was placed with the proximal portion above the stricture in the distal portion intraduodenal in good position.  Excellent biliary drainage noted.  The patient tolerated the procedure well and was moved to recovery.  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  Status post endoscopic retrograde cholangiopancreatography with biliary sphincterotomy, bile  duct brushing, and biliary stent placement.  RECOMMENDATIONS: 1. Continue antibiotics. 2. Follow up brushing results. 3. Follow up laboratories.     [name]. Marina Goodell, MD     JNP/MEDQ  D:  09/26/2010  T:  09/26/2010  Job:  [id number]  cc:   Leighton Roach McDiarmid, M.D.  Electronically Signed by Yancey Flemings MD on 09/30/2010 03:11:21 PM

## 2010-10-01 ENCOUNTER — Inpatient Hospital Stay (HOSPITAL_COMMUNITY): Payer: Medicare Other

## 2010-10-01 ENCOUNTER — Encounter (HOSPITAL_COMMUNITY): Payer: Self-pay | Admitting: Radiology

## 2010-10-01 DIAGNOSIS — Z01810 Encounter for preprocedural cardiovascular examination: Secondary | ICD-10-CM

## 2010-10-01 DIAGNOSIS — K859 Acute pancreatitis without necrosis or infection, unspecified: Secondary | ICD-10-CM

## 2010-10-01 DIAGNOSIS — R932 Abnormal findings on diagnostic imaging of liver and biliary tract: Secondary | ICD-10-CM

## 2010-10-01 LAB — COMPREHENSIVE METABOLIC PANEL
ALT: 38 U/L — ABNORMAL HIGH (ref 0–35)
AST: 22 U/L (ref 0–37)
Alkaline Phosphatase: 193 U/L — ABNORMAL HIGH (ref 39–117)
CO2: 19 mEq/L (ref 19–32)
Chloride: 110 mEq/L (ref 96–112)
GFR calc Af Amer: >60 mL/min (ref >60)
GFR calc non Af Amer: >60 mL/min (ref >60)
Glucose, Bld: 77 mg/dL (ref 70–99)
Sodium: 137 mEq/L (ref 135–145)
Total Bilirubin: 2.2 mg/dL — ABNORMAL HIGH (ref 0.3–1.2)

## 2010-10-01 LAB — DIFFERENTIAL
Basophils Absolute: 0 10*3/uL (ref 0.0–0.1)
Basophils Relative: 0 % (ref 0–1)
Lymphocytes Relative: 6 % — ABNORMAL LOW (ref 12–46)
Monocytes Absolute: 1.8 10*3/uL — ABNORMAL HIGH (ref 0.1–1.0)
Monocytes Relative: 9 % (ref 3–12)
Neutro Abs: 17.1 10*3/uL — ABNORMAL HIGH (ref 1.7–7.7)
Neutrophils Relative %: 85 % — ABNORMAL HIGH (ref 43–77)

## 2010-10-01 LAB — CBC
HCT: 25.6 % — ABNORMAL LOW (ref 36.0–46.0)
Hemoglobin: 8.6 g/dL — ABNORMAL LOW (ref 12.0–15.0)
MCHC: 33.6 g/dL (ref 30.0–36.0)
RBC: 2.96 MIL/uL — ABNORMAL LOW (ref 3.87–5.11)

## 2010-10-01 LAB — LIPASE, BLOOD: Lipase: 79 U/L — ABNORMAL HIGH (ref 11–59)

## 2010-10-01 LAB — BASIC METABOLIC PANEL
CO2: 22 mEq/L (ref 19–32)
Calcium: 8.4 mg/dL (ref 8.4–10.5)
Chloride: 107 mEq/L (ref 96–112)
Creatinine, Ser: 0.74 mg/dL (ref 0.4–1.2)
GFR calc Af Amer: >60 mL/min (ref >60)
Sodium: 137 mEq/L (ref 135–145)

## 2010-10-01 LAB — CLOSTRIDIUM DIFFICILE BY PCR: Toxigenic C. Difficile by PCR: NEGATIVE

## 2010-10-01 MED ORDER — IOHEXOL 300 MG/ML  SOLN
100.0000 mL | Freq: Once | INTRAMUSCULAR | Status: AC | PRN
  Administered 2010-10-01: 100 mL via INTRAVENOUS

## 2010-10-01 MED ORDER — IOHEXOL 300 MG/ML  SOLN: 100.0000 mL | Freq: Once | INTRAMUSCULAR | Status: DC | PRN

## 2010-10-02 DIAGNOSIS — R197 Diarrhea, unspecified: Secondary | ICD-10-CM

## 2010-10-02 DIAGNOSIS — K859 Acute pancreatitis without necrosis or infection, unspecified: Secondary | ICD-10-CM

## 2010-10-02 DIAGNOSIS — C221 Intrahepatic bile duct carcinoma: Secondary | ICD-10-CM

## 2010-10-02 DIAGNOSIS — R932 Abnormal findings on diagnostic imaging of liver and biliary tract: Secondary | ICD-10-CM

## 2010-10-02 DIAGNOSIS — R112 Nausea with vomiting, unspecified: Secondary | ICD-10-CM

## 2010-10-02 LAB — BASIC METABOLIC PANEL
BUN: 3 mg/dL — ABNORMAL LOW (ref 6–23)
Calcium: 8.2 mg/dL — ABNORMAL LOW (ref 8.4–10.5)
Creatinine, Ser: 0.67 mg/dL (ref 0.4–1.2)
GFR calc non Af Amer: >60 mL/min (ref >60)
Glucose, Bld: 115 mg/dL — ABNORMAL HIGH (ref 70–99)

## 2010-10-02 NOTE — Consult Note (Signed)
[name], [name] NO.:  [id number]  MEDICAL RECORD NO.:  [id number]           PATIENT TYPE:  I  LOCATION:  6704                         FACILITY:  [facility]  PHYSICIAN:  Cassell Clement, M.D. DATE OF BIRTH:  [birth year]  DATE OF CONSULTATION:  10/01/2010 DATE OF DISCHARGE:                                CONSULTATION   CARDIOLOGIST:  Veverly Fells. Excell Seltzer, MD  PRIMARY MD:  [facility]  We were asked to see this pleasant 60 year old African American female for preoperative evaluation.  The patient has a past history of chest pain without significant coronary artery disease.  She was hospitalized here for the past 10 days with a diagnosis of extrahepatic cholangiocarcinoma and pancreatitis.  The eventual plan is for her to be transferred to [facility] for definitive surgery.  The patient has not been having chest pain this admission.  Her symptoms have been nausea, vomiting, diarrhea, and abdominal pain.  She has had very poor p.o. intake.  She has had no hematemesis or melena.  She has a past history of lower extremity pain with ambulation, but is not a candidate for further vascular surgery secondary to the patient being at high risk for reinfection.  She had had a previous right to left fem-fem graft, which had to be removed because of infection.  ALLERGIES:  The patient is allergic to SULFA.  PAST MEDICAL HISTORY:  She did have prior cardiac catheterization in August 2011, which showed nonobstructive disease in LAD, 30% diagonal 1, and circumflex was clear, and there was a 40% right coronary artery lesion.  Her echocardiogram in 2003 showed an ejection fraction of 55%.  Past medical history is positive for hypertension, elevated lipids, obesity, and remote tobacco use.  She has a positive family history for heart problems.  The patient has also had a history of depression, peptic ulcer disease, colon cancer, GERD, hiatal hernia, and  esophageal stricture.  SOCIAL HISTORY:  She lives in [place].  She has a history of greater than 50 pack-years of smoking and quit about 10 years ago.  She lives with her husband.  The patient does not use tobacco now and does not use alcohol.  She is disabled from [employer].  She does not use illicit drugs.  FAMILY HISTORY:  Negative for coronary disease in her mother and no data available for her father.  Two siblings have had coronary artery disease, however.  REVIEW OF SYSTEMS:  Negative for fever and chills.  No photophobia or head and neck congestion.  No skin rash.  The patient does have a history of chronic shortness of breath with exertion, but none at rest. She does have a history of claudication.  She denies any wheezing or cough.  She denies any genitourinary symptoms.  She has had nausea, vomiting, and diarrhea for the past 8 days.  Neuropsychiatric is negative.  Musculoskeletal reveals arthralgias and lower extremity pain. Endocrine reveals no cold tolerance or skin changes.  All other systems reviewed and are negative.  PHYSICAL EXAMINATION:  VITAL SIGNS:  Blood pressure 154/92, pulse 99, temperature 99.7,  respirations are 20, O2 sat 95% on room air. GENERAL:  This is a well-developed, well-nourished, obese African American female in mild distress because of her nausea and her abdominal discomfort. HEAD AND NECK:  Unremarkable in detail.  The pupils were equal and reactive.  Extraocular movements are full.  Sclerae nonicteric. ABDOMEN:  Soft but tender with decreased bowel sounds.  The neck reveals normal range of motion.  There are no bruits.  Jugular venous pressure is not increased.  There is no lymphadenopathy. HEART:  Soft systolic murmur at left sternal edge.  There is no gallop or rub. LUNGS:  Clear to percussion and auscultation. SKIN:  No rash. BREASTS:  Not examined. RECTAL:  Not done. EXTREMITIES:  No phlebitis or edema.  There are no joint  deformities or spine deformities. NEUROLOGIC:  She is oriented and alert and cranial nerves are grossly intact.  The chest x-ray shows stable mild cardiomegaly and no active disease on September 20, 2010.  A CT of the abdomen and pelvis on October 01, 2010, showed evidence of acute pancreatitis and probable central obstructing cholangiocarcinoma.  Her electrocardiogram shows normal sinus rhythm at 60 per minute.  She has moderate voltage for LVH, and she has some nonspecific T-wave abnormalities.  There has been no significant change since EKG of August 2011.  Blood work include a white count of 20,200, hemoglobin of 8.6, hematocrit 25.6, platelet count 413,000.  Sodium 137, potassium 3.3, BUN is 4, creatinine is 0.72, blood sugar is 77.  Her bilirubin is 2.2 total, alkaline phosphatase is 193, SGOT 22, SGPT 38, albumin 1.9. Lipase 79, down from 541.  GI cancer antigen 19-9 is elevated at 678. Cancer antigen 125 is 8.9.  Her prealbumin is low at 6.0, and her stool is negative for C. difficile.  IMPRESSION: 1. Cholangiocarcinoma with complicating factor of acute pancreatitis. 2. History of nonobstructive coronary artery disease with no recent     cardiac symptoms.  RECOMMENDATIONS:  We do not feel that further cardiac testing is needed at this point.  We would favor transfer to [facility] at the earliest convenience when the bed is available so that she can proceed with her needed surgery for her cholangiocarcinoma.  Many thanks for the opportunity to see this pleasant woman with you.          ______________________________ Cassell Clement, M.D.     TB/MEDQ  D:  10/01/2010  T:  10/02/2010  Job:  [id number]  cc:   Doneen Poisson, MD  Electronically Signed by Cassell Clement M.D. on 10/02/2010 03:40:19 PM

## 2010-10-03 DIAGNOSIS — R932 Abnormal findings on diagnostic imaging of liver and biliary tract: Secondary | ICD-10-CM

## 2010-10-03 DIAGNOSIS — K859 Acute pancreatitis without necrosis or infection, unspecified: Secondary | ICD-10-CM

## 2010-10-03 LAB — BASIC METABOLIC PANEL
BUN: 3 mg/dL — ABNORMAL LOW (ref 6–23)
Calcium: 8.3 mg/dL — ABNORMAL LOW (ref 8.4–10.5)
Chloride: 107 mEq/L (ref 96–112)
Creatinine, Ser: 0.61 mg/dL (ref 0.4–1.2)
GFR calc Af Amer: >60 mL/min (ref >60)
GFR calc non Af Amer: >60 mL/min (ref >60)

## 2010-10-03 NOTE — Procedures (Signed)
Summary: ERCP  NAME:  [name], [name]                 ACCOUNT NO.:  [id number]      MEDICAL RECORD NO.:  [id number]           PATIENT TYPE:  I      LOCATION:  6704                         FACILITY:  [facility]      PHYSICIAN:  [name]. Marina Goodell, MD      DATE OF BIRTH:  May 28, 1951      DATE OF PROCEDURE:  09/26/2010   DATE OF DISCHARGE:                                  OPERATIVE REPORT         SURGEON:  [name]. Marina Goodell, MD      PROCEDURE:  Endoscopic retrograde cholangiopancreatography with biliary   sphincterotomy, bile duct stricture brushing, and biliary stent   placement.      INDICATION:  Obstructive jaundice.      HISTORY:  This is a 60 year old female who was admitted to the hospital   with jaundice.  She has been having intermittent vague abdominal pain.   She has had 20-pound weight loss over the past 6 months.  She is status   post cholecystectomy.  Initial imaging studies with ultrasound and CT   scan revealed no definitive cause.  Causes for intrahepatic disease such   as hepatitis have been excluded.  MRCP is strongly suggested, a common   hepatic duct stricture with upstream dilation.  Some cholecystectomy   clip-induced artifact with no obvious mass.  She is now for ERCP.  The   nature of the procedure as well as the risks, benefits, and alternatives   were reviewed in detail with the patient and her family prior to   proceeding.  She understood and agreed to proceed.  Preprocedure   antibiotics in the form of ciprofloxacin IV were administered.  After   informed consent was obtained, the patient was sedated over the course   with 125 mcg of fentanyl, 12.5 mg of Versed, and 25 mg Benadryl IV.   Glucagon 1 mg IV was given as a duodenal relaxant.  A Pentax side-   viewing endoscope was then passed blindly into the esophagus.  The   stomach was carefully examined and appeared normal as did the duodenal   bulb and postbulbar duodenum.  The major ampulla was normal.  The minor   ampulla was not sought.      X-RAY FINDINGS:   1. Scout radiograph of the abdomen revealed cholecystectomy clips.   2. Scout radiograph of the abdomen with the endoscope in position       revealed the same.   3. Initial injection of contrast via the major ampulla yielded a       normal partial pancreatogram.      Multiple attempts were made at accessing the common duct, predominantly   with a wire-only technique, but were unsuccessful.  Subsequently, it was   elected to place the guidewire into the pancreatic duct to assist with   biliary cannulation.  This was done carefully as the distal duct was   narrow and somewhat angulated.  However, in time, this was achieved   without resistance.  Subsequently, on the initial try, a second   guidewire passed into the biliary tree.  Initial injection of contrast   revealed that the distal portion of the bile duct was normal.  However,   beginning at the level of the cholecystectomy clips and proximal for   about 2.5 cm was malignant appearing bile duct stricture.  Upstream   dilation of the left liver biliary system was observed.  No contrast   flowing into the right system obviously.  Next, a large biliary   sphincterotomy was performed with cutting over the guidewire in the 12   o'clock orientation via the ERBE system.  The cutting catheter was then   exchanged for a biliary brush.  The brush was moved across the stricture   multiple times.  The brush was then removed and the specimen submitted   for pathologic analysis.  Brush material was bloody.  Finally, over the   guidewire, a 12-cm in length, 10-French, plastic biliary endoprosthesis   was placed with the proximal portion above the stricture in the distal   portion intraduodenal in good position.  Excellent biliary drainage   noted.  The patient tolerated the procedure well and was moved to   recovery.      IMPRESSION:  Malignant-appearing bile duct stricture of the common   hepatic  duct worrisome for primary cholangiocarcinoma.  Status post   endoscopic retrograde cholangiopancreatography with biliary   sphincterotomy, bile duct brushing, and biliary stent placement.      RECOMMENDATIONS:   1. Continue antibiotics.   2. Follow up brushing results.   3. Follow up laboratories.               [name]. Marina Goodell, MD               JNP/MEDQ  D:  09/26/2010  T:  09/26/2010  Job:  [id number]      cc:   Leighton Roach McDiarmid, M.D.

## 2010-10-04 ENCOUNTER — Encounter: Payer: Medicare Other | Admitting: Internal Medicine

## 2010-10-04 DIAGNOSIS — R932 Abnormal findings on diagnostic imaging of liver and biliary tract: Secondary | ICD-10-CM

## 2010-10-04 DIAGNOSIS — K859 Acute pancreatitis without necrosis or infection, unspecified: Secondary | ICD-10-CM

## 2010-10-04 LAB — HEPATIC FUNCTION PANEL
Alkaline Phosphatase: 191 U/L — ABNORMAL HIGH (ref 39–117)
Indirect Bilirubin: 0.9 mg/dL (ref 0.3–0.9)
Total Protein: 5.6 g/dL — ABNORMAL LOW (ref 6.0–8.3)

## 2010-10-05 ENCOUNTER — Encounter: Payer: Self-pay | Admitting: Internal Medicine

## 2010-10-05 LAB — BASIC METABOLIC PANEL
Chloride: 107 mEq/L (ref 96–112)
Creatinine, Ser: 0.64 mg/dL (ref 0.4–1.2)
GFR calc Af Amer: >60 mL/min (ref >60)
Sodium: 138 mEq/L (ref 135–145)

## 2010-10-08 NOTE — Consult Note (Signed)
[name], [name]                 ACCOUNT NO.:  [id number]  MEDICAL RECORD NO.:  [id number]           PATIENT TYPE:  LOCATION:                                 FACILITY:  PHYSICIAN:  Rose Phi. Myna Hidalgo, M.D. DATE OF BIRTH:  Oct 09, 1950  DATE OF CONSULTATION: DATE OF DISCHARGE:                                CONSULTATION   REFERRING PHYSICIAN:  Doneen Poisson, MD  REASON FOR CONSULTATION:  Extrahepatic cholangiocarcinoma.  HISTORY OF PRESENT ILLNESS:  [name] is a very nice 60 year old African American female.  She has a history of cystectomy.  She has a history of hyperlipidemia and hypertension.  She does have peripheral vascular disease.  There is also history of depression.  She is on numerous medications.  She presented with a 20-pound weight loss.  She had some abdominal pain. She also had some jaundice.  She was admitted on September 19, 2010.  On admission, her CBC showed a white cell count of 8.5, hemoglobin 12.6, hematocrit 37.3, platelet count 271,000.  Bilirubin is 5.6 with an alkaline phosphatase of 455, SGPT 285, SGOT 141.  Albumin 3.4 with a total protein of 7.3.  She did undergo hepatitis testing which was negative.  HIV was also negative.  She did undergo a CT of the abdomen and pelvis on admission.  This did not show any obvious malignancy.  She had no obvious adenopathy.  Liver, spleen, pancreas, and kidneys all look normal.  Abdominal ultrasound did show normal common bile duct diameter.  There was some slight prominence of the intrahepatic ducts.  She did undergo MRCP.  This was done on September 25, 2010.  This showed intrahepatic biliary dilatation.  The pancreatic duct was normal.  It was felt that this may be worrisome for a biliary-type tumor.  She then was seen by GI.  I think that [facility] saw the patient.  Dr. Marina Goodell was involved.  I think Dr. Marina Goodell went ahead and placed a stent. The common bile duct did have a stricture of 2-2.5 cm.  This  was apparently the common hepatic duct.  This dilatation left the biliary system.  Again, the stent was placed without difficulty.  Brushings were done.  The pathology report ([id number]) showed marked atypical cells consistent with adenocarcinoma.  She did have a CA19-9 drawn which was elevated at 6708.  She has lost some weight.  She does have much of an appetite.  She does have some abdominal discomfort.  Oncology was consulted for an opinion as to how to manage this malignancy.  Her past medical history is as stated previously.  Her allergies are to SULFA.  MEDICATIONS: 1. Aspirin 81 mg daily. 2. Pletal 100 mg p.o. b.i.d. 3. Lovenox 40 mg subcu daily. 4. Protonix 40 mg daily. 5. Zosyn 3/0.375 g IV q.8 h. 6. Apresoline 10 mg IV q.6 h. p.r.n. 7. Reglan 10 mg q.6 h. p.r.n. 8. Phenergan 2.5 mg IV q.6 h. p.r.n.  SOCIAL HISTORY:  Remarkable for past tobacco use.  She has about a 50 pack-year tobacco use, she stopped 10 years ago.  She has no history  of alcohol use.  There is no obvious occupational exposures.  FAMILY HISTORY:  Remarkable for I think history of gastric cancer.  PHYSICAL EXAMINATION:  GENERAL:  This is a fairly well-developed, well- nourished African American female in no obvious distress. VITAL SIGNS:  Temperature 100.2, pulse 101, respiratory rate 24, blood pressure 137/83. HEENT:  Head exam shows no obvious scleral icterus.  There is no adenopathy in her neck.  There are no oral lesions.  Pupils react appropriately. LUNGS:  Clear bilaterally. CARDIAC:  Regular rate and rhythm with normal S1 and S2.  There are no murmurs, rubs, or bruits. ABDOMEN:  Soft.  She has good bowel sounds.  There is no fluid wave. There is some tenderness in the right upper quadrant.  I cannot palpate any obvious right upper quadrant mass.  There is no hepatomegaly. EXTREMITIES:  No clubbing, cyanosis, or edema. SKIN:  No rash, ecchymosis, or petechia.  IMPRESSION:  Ms.  [name] is a 60 year old African American female with an extrahepatic cholangiocarcinoma.  Again, this appears to be localized.  I believe that she really needs to be seen by Surgery.  I think the only way to cure this is through surgical resection.  I do not see a role for chemotherapy at this point in time.  I suppose that if she is not resectable or has metastatic disease, then she would be a candidate for systemic chemotherapy.  Her nutritional state is somewhat tenuous right now.  She does need to improve her nutritional state.  She needs to be seen by nutritionist and help with her caloric intake.  We will put her on a little Megace to see if this does not help with caloric intake.  I do not see any other additional x-ray test needs to be done right now.  Her CA19-9 is quite elevated, so this is somewhat concerning that she may have disease more advanced than the scans show.  I spoke to the patient and her daughter.  They both have a good understanding of the situation.  We certainly will follow along and help out as necessary.     Rose Phi. Myna Hidalgo, M.D.     PRE/MEDQ  D:  09/28/2010  T:  09/29/2010  Job:  [id number]  cc:   [name]. Marina Goodell, MD Doneen Poisson, MD Veverly Fells. Excell Seltzer, MD  Electronically Signed by Arlan Organ  on 10/08/2010 07:35:10 AM

## 2010-10-18 NOTE — Consult Note (Signed)
NAMEMELANEE, [name]                 ACCOUNT NO.:  [id number]  MEDICAL RECORD NO.:  [id number]           PATIENT TYPE:  I  LOCATION:  6704                         FACILITY:  [facility]  PHYSICIAN:  Almond Lint, MD       DATE OF BIRTH:  October 21, 1950  DATE OF CONSULTATION:  09/29/2010 DATE OF DISCHARGE:                                CONSULTATION   REQUESTING PROVIDER:  Dr. Gearlean Alf.  CHIEF COMPLAINT:  Extrahepatic cholangiocarcinoma.  HISTORY OF PRESENT ILLNESS:  [name] is a very nice 60 year old female who presents with about a 66-month history of nausea, vomiting, abdominal discomfort/pain.  She also has 20-pound weight loss over the past month. She has had virtually no appetite for the last month and was jaundiced prior to seeing Dr. Marina Goodell.  He placed a stent for jaundice.  He saw a 2.5 cm malignant-appearing common hepatic duct stricture with upstream dilatation on the left, localized to the hilum in the right side as known there was minimal filling of the right side.  Brushings were positive for adenocarcinoma consistent with cholangiocarcinoma.  Her bilirubin was then coming down while in the hospital, but she did have some mild post ERCP pancreatitis.  She says she has never had symptoms like this before.  She has had a cholecystectomy in the past but this was not the same.  PAST MEDICAL HISTORY:  Extensive and includes hypertension, hyperlipidemia, reflux, nonobstructive coronary artery disease, depression, chronic abdominal pain, degenerative joint disease, peripheral vascular disease, status post fem-fem bypass complicated by infection.  ALLERGIES:  SULFA.  HOME MEDICATIONS:  Toprol, Neurontin, Norvasc, nitroglycerin, simvastatin, Tylenol, HCTZ, Pletal, aspirin, omeprazole, Voltaren Gel, Carafate, and Dexilant.  SOCIAL HISTORY:  She is a former smoker, who is retired, married, does not use drugs, does not drink alcohol.  FAMILY HISTORY:  Mother had colon and  gastric cancer.  She is not sure which one and there is a family history for sarcoidosis.  PAST SURGICAL HISTORY:  She has had left rotator cuff surgery x2 and cholecystectomy.  REVIEW OF SYSTEMS:  Negative other than in the HPI.  PHYSICAL EXAMINATION:  VITAL SIGNS:  T-max is 100.4, T-current 99.8, pulse 81, blood pressure 126/73, and sat is 98% on room air. GENERAL:  She is alert and oriented x3, in no acute distress. HEENT:  Normocephalic and atraumatic.  Sclerae are mildly icteric. NECK:  Supple without lymphadenopathy.  No thyromegaly.  Trachea is midline. HEART:  Regular rate and rhythm.  No murmurs. LUNGS:  Clear bilaterally.  No wheezing. ABDOMEN:  Soft, slightly distended, slightly tender in the right upper quadrant. EXTREMITIES:  Warm and well perfused with a trace of pitting edema. PSYCH:  Mood appears depressed. NEURO:  The patient is able to ambulate.  LABORATORY DATA:  Prealbumin is 6.  Lipase is 73.  CMP, sodium 137, potassium 4.1, chloride 106, CO2 of 24, glucose 115, BUN 6, creatinine 0.94, total bili 3.2, alk phos 263, AST and ALT 32 and 70, albumin 2.1, and calcium 8.8.  CBC, white count is 21.3, hemoglobin and hematocrit are 9.3 and 28.5, and platelet count 327,000.  CA-99 is 678, CA-125 8.9. Again biopsy results markedly atypical cells consistent with adenocarcinoma.  MRCP is reviewed and shows intrahepatic biliary dilatation and normal appearance of the extrahepatic common bile duct concerning for Klatskin tumor although discrete mass can be identified, the areas partially obscured by surgical clips, perfusion abnormality involving the liver with possible right portal vein narrowing and obstruction, simple appearing hepatic cyst, no lymphadenopathy.  ERCP, common hepatic duct stricture was intrahepatic biliary dilatation that appears malignant.  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  This appears to be surgically resectable based on the imaging studies, however, these are complicated to repair and the patient would benefit from a tertiary care center.  She definitely will need Cardiology clearance which can be arranged to her primary cardiologist, Dr. Excell Seltzer.  She also will need a Nutrition Consult for protein-calorie malnutrition and supplementation. The patient was advised the importance of improving her nutrition in order to heal better from Surgery.  I also discussed that sometimes the patients appeared to be surgical candidate  up front but are not at operation because of carcinomatosis and/or potential difficulties with vascular involvement. We will also ask [name] about stopping her Pletal prior to surgery as she will need additional bypass at some point for her peripheral vascular disease in her right leg.    Also in terms of her low-grade temperatures and leukocytosis, I agree with keeping her on antibiotics. She certainly may have obstructive in the right side and may need an extrahepatic drain in order to adequately drain her biliary tree. This could be reflective of early cholangitis.  If her bilirubin is not down further tomorrow and she remains leukocytotic I would certainly re- image her to see if she remains dilated.  The patient and family were advised of recommendation for referral, they selected [facility] for a referal center.  I will arrange for that  to be done through my office.  The patient was advised that she would be going home prior to this most likely follow up with e a surgeon there as an outpatient.  Thank you for this very interesting consult.     Almond Lint, MD     FB/MEDQ  D:  09/29/2010  T:  09/30/2010  Job:  [id number]  cc:   [name]. Marina Goodell, MD Veverly Fells. Excell Seltzer, MD Di Kindle. Edilia Bo, M.D.  Electronically Signed by Almond Lint MD on 10/17/2010 12:54:48 PM

## 2010-10-23 NOTE — Consult Note (Signed)
Summary: [facility] Consultation Report  [facility] Consultation Report   Imported By: Earl Many 10/15/2010 17:21:59  _____________________________________________________________________  External Attachment:    Type:   Image     Comment:   External Document

## 2010-10-26 LAB — POCT I-STAT, CHEM 8
BUN: 8 mg/dL (ref 6–23)
Hemoglobin: 13.6 g/dL (ref 12.0–15.0)
Potassium: 4.1 mEq/L (ref 3.5–5.1)
Sodium: 141 mEq/L (ref 135–145)
TCO2: 29 mmol/L (ref 0–100)

## 2010-10-26 LAB — BASIC METABOLIC PANEL
Calcium: 9.3 mg/dL (ref 8.4–10.5)
GFR calc Af Amer: >60 mL/min (ref >60)
GFR calc non Af Amer: >60 mL/min (ref >60)
Sodium: 140 mEq/L (ref 135–145)

## 2010-10-30 NOTE — Consult Note (Signed)
Summary: [facility] Consultation Report  [facility] Consultation Report   Imported By: Kassie Mends 10/18/2010 09:11:40  _____________________________________________________________________  External Attachment:    Type:   Image     Comment:   External Document

## 2010-11-11 ENCOUNTER — Emergency Department (HOSPITAL_COMMUNITY)
Admission: EM | Admit: 2010-11-11 | Discharge: 2010-11-12 | Disposition: A | Discharge status: Nursing Home (Includes ICF) | Payer: Medicare Other | Attending: Emergency Medicine | Admitting: Emergency Medicine

## 2010-11-11 ENCOUNTER — Emergency Department (HOSPITAL_COMMUNITY): Payer: Medicare Other

## 2010-11-11 DIAGNOSIS — J90 Pleural effusion, not elsewhere classified: Secondary | ICD-10-CM | POA: Insufficient documentation

## 2010-11-11 DIAGNOSIS — D72829 Elevated white blood cell count, unspecified: Secondary | ICD-10-CM | POA: Insufficient documentation

## 2010-11-11 DIAGNOSIS — R509 Fever, unspecified: Secondary | ICD-10-CM | POA: Insufficient documentation

## 2010-11-11 DIAGNOSIS — I252 Old myocardial infarction: Secondary | ICD-10-CM | POA: Insufficient documentation

## 2010-11-11 DIAGNOSIS — D649 Anemia, unspecified: Secondary | ICD-10-CM | POA: Insufficient documentation

## 2010-11-11 DIAGNOSIS — R000 Tachycardia, unspecified: Secondary | ICD-10-CM | POA: Insufficient documentation

## 2010-11-11 DIAGNOSIS — I10 Essential (primary) hypertension: Secondary | ICD-10-CM | POA: Insufficient documentation

## 2010-11-11 DIAGNOSIS — A419 Sepsis, unspecified organism: Secondary | ICD-10-CM | POA: Insufficient documentation

## 2010-11-11 DIAGNOSIS — F411 Generalized anxiety disorder: Secondary | ICD-10-CM | POA: Insufficient documentation

## 2010-11-11 DIAGNOSIS — Z79899 Other long term (current) drug therapy: Secondary | ICD-10-CM | POA: Insufficient documentation

## 2010-11-11 DIAGNOSIS — I959 Hypotension, unspecified: Secondary | ICD-10-CM | POA: Insufficient documentation

## 2010-11-11 DIAGNOSIS — I998 Other disorder of circulatory system: Secondary | ICD-10-CM | POA: Insufficient documentation

## 2010-11-11 LAB — DIFFERENTIAL
Basophils Absolute: 0 10*3/uL (ref 0.0–0.1)
Lymphocytes Relative: 11 % — ABNORMAL LOW (ref 12–46)
Monocytes Relative: 10 % (ref 3–12)

## 2010-11-11 LAB — URINALYSIS, ROUTINE W REFLEX MICROSCOPIC
Hgb urine dipstick: NEGATIVE
Specific Gravity, Urine: 1.023 (ref 1.005–1.030)
Urobilinogen, UA: 1 mg/dL (ref 0.0–1.0)

## 2010-11-11 LAB — COMPREHENSIVE METABOLIC PANEL
AST: 23 U/L (ref 0–37)
Albumin: 2.7 g/dL — ABNORMAL LOW (ref 3.5–5.2)
Alkaline Phosphatase: 90 U/L (ref 39–117)
Chloride: 98 mEq/L (ref 96–112)
Creatinine, Ser: 1.2 mg/dL (ref 0.4–1.2)
GFR calc Af Amer: 56 mL/min — ABNORMAL LOW (ref >60)
Potassium: 4.8 mEq/L (ref 3.5–5.1)
Sodium: 130 mEq/L — ABNORMAL LOW (ref 135–145)
Total Bilirubin: 0.6 mg/dL (ref 0.3–1.2)

## 2010-11-11 LAB — CBC
Platelets: 354 10*3/uL (ref 150–400)
RBC: 2.49 MIL/uL — ABNORMAL LOW (ref 3.87–5.11)
WBC: 13.2 10*3/uL — ABNORMAL HIGH (ref 4.0–10.5)

## 2010-11-11 LAB — URINE MICROSCOPIC-ADD ON

## 2010-11-13 LAB — CROSSMATCH: Unit division: 0

## 2010-11-13 LAB — URINE CULTURE: Colony Count: >=100000

## 2010-11-18 LAB — CULTURE, BLOOD (ROUTINE X 2): Culture  Setup Time: 201204020840

## 2010-12-11 ENCOUNTER — Telehealth: Payer: Self-pay | Admitting: Internal Medicine

## 2010-12-11 DIAGNOSIS — I10 Essential (primary) hypertension: Secondary | ICD-10-CM

## 2010-12-11 DIAGNOSIS — K219 Gastro-esophageal reflux disease without esophagitis: Secondary | ICD-10-CM

## 2010-12-11 MED ORDER — METOPROLOL SUCCINATE ER 100 MG PO TB24: 100.0000 mg | ORAL_TABLET | Freq: Every day | ORAL | Status: DC

## 2010-12-11 MED ORDER — OMEPRAZOLE 20 MG PO TBEC: 1.0000 | DELAYED_RELEASE_TABLET | Freq: Two times a day (BID) | ORAL | Status: DC

## 2010-12-12 NOTE — Telephone Encounter (Signed)
Rx called into [facility], pt aware

## 2010-12-23 ENCOUNTER — Encounter: Payer: Self-pay | Admitting: Internal Medicine

## 2010-12-25 NOTE — Discharge Summary (Signed)
[name], [name] NO.:  [id number]   MEDICAL RECORD NO.:  [id number]          PATIENT TYPE:  INP   LOCATION:  2002                         FACILITY:  [facility]   PHYSICIAN:  Di Kindle. Edilia Bo, M.D.DATE OF BIRTH:  [birth year]   DATE OF ADMISSION:  08/20/2007  DATE OF DISCHARGE:                               DISCHARGE SUMMARY   DATE OF DISCHARGE:  Date of discharge possibly indicated for August 24, 2007 or August 25, 2007.   ADMISSION DIAGNOSIS:  Left iliac arterial occlusion.   DISCHARGE DIAGNOSIS:  Left iliac arterial occlusion.   SECONDARY DISCHARGE DIAGNOSES:  1. Obesity.  2. Hypertension.  3. Hypercholesterolemia.   CONSULTATIONS:  Rapid response nurse August 21, 2007.   PROCEDURE:  Right to left FEM-FEM bypass by [name] on August 20, 2007.   BRIEF HISTORY AND PHYSICAL:  [name] was seen in the office on  August 04, 2007 in consultation concerning her left lower extremity  claudication.  She was referred by Dr. Excell Seltzer.  This is a pleasant 60-  year-old woman who approximately 6 months ago began developing gradual  onset of pain in her left hip, thigh and calf associated with ambulation  and relieved with rest.  This occurred at approximately one to two  blocks.  Over the last six months, her symptoms have gradually  progressed.  She also has had some claudication in the right calf and to  a lesser degree involving the thigh and hip.  In addition, she has  significant osteoarthritis involving both knees and certainly some of  her pain is probably related to this.  However, on the left side she has  developed rest pain.  She has no history of nonhealing ulcers.   Dr. 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  This was a long segment of total occlusion and she was not an  ideal for endovascular intervention.  After discussion with Ms.  [name],  they elected to consider surgical revascularization.  After reviewing  her arteriogram, [name] stated that she had left iliac artery  occlusion with reconstitution of the external iliac artery on the left,  patent superficial femoral artery and popliteal artery with three vessel  runoff on the left.  On the right side she had a patent inferior  inguinal vessel.  [name] explained that he thinks that the safest  surgical option would be a FEM-FEM bypass and graft.  He thinks that an  aortofemoral bypass grafting would be associated with significantly high  risks given that right iliac artery appears to be widely patent.  He  does not think it is necessary.  Certainly, she is at slightly higher  risk for wound healing problems and graft infection given her obesity  but feels that her symptoms are significantly disabling.  As her  symptoms are now progressed to the point of rest pain, she is developing  limb-threatening ischemia.  [name] has discussed the potential  complications of the surgery including  the risk of wound healing  problems and graft infection.  She understands the chances of the graft  remaining patent are 70% at 5 years.  Given her history of chest pain,  we tentatively scheduled her surgery for August 20, 2007 pending  discussion with Dr. Excell Seltzer to be sure that he thinks it is safe from a  cardiac standpoint.  If she needs any further cardiac testing prior to  surgery then we will certainly delay her surgery, but it seemed that  everything was okay when she saw Dr. Excell Seltzer and felt that there were not  any changes and that the patient was able to go ahead with the surgery  on August 20, 2007.  The patient signed consent and agreed to this  procedure.   HOSPITAL COURSE:  The patient was admitted to [facility] on  August 20, 2007 for an elective surgery of a FEM-FEM bypass and graft  with signed consent.  The patient was taken to the operating  room and  had the procedure done with no complications and remained stable.  The  patient was transferred from the operating room to the PACU in stable  condition and remained stable and then transferred later to 3300.  The  patient continued to remain stable overnight.  On postoperative day #1  vital signs were stable and on the lab work her white blood cell count  was 10.7 and hemoglobin 11.1, hematocrit 13.5 and platelet count  204,000.  Her sodium was 138, potassium at 4.3, chloride 104, bicarb 29,  BUN 5 and creatinine 0.89 with glucose of 144.  Upon entering the room  the patient stated that she had some tenderness at the incision site.  The patient is eating and tolerating food.  She has ambulated this  morning.  On physical examination the patient was alert and oriented x3.  She had normal heart rate and regular rhythm.  Her lungs were clear  bilaterally.  Abdomen was soft, nondistended, tender with active bowel  sounds.  Femoral bilateral incisions, there were dressings in place.  The plan was to remove those on Saturday, August 22, 2007.  There were  no hematomas or signs of infection or drainage.  Lower extremities - she  had bilateral positive Doppler flow to PT and DP areas.  The assessment  and plan at this time was to increase ambulation, discontinue the Foley  and transfer her to floor 2000, looking to possibly discharging to home  on Sunday or Monday and patient was changed to normal saline lock and DC  fluids.  On August 21, 2007 rapid response was contacted to assess  patient for new onset of weakness that started when she started to  transfer from unit 2300.  The patient appeared neurologically intact  with some generalized weakness.  Labs were ordered by the physician and  no rapid response team interventions were needed at this time.  They  will continue to assist if needed.  On postop day #2, August 22, 2007,  the patient was feeling much better this morning.  Her  blood pressure  was 104/70, heart rate was in the 80's and the patient was alert and  oriented x3.  The dressings were dry in the femoral areas and she had 3+  palpable pulses bilateral in the DP/PT.  Neurologically the patient was  intact.  Abdomen was nontender and soft.  Appetite was good.  The plan  at this time was to continue to increase ambulation  and possibly plans  for discharge on Monday.  On postoperative day #3 the patient continued  to have a blood pressure of 118/69 but patient did have an increased  temperature of 102.  The patient's last white blood count was at 14.9 on  August 23, 2007.  The patient stated that she still felt a little weak,  had no diaphoresis, no cough and was a little sore around the incision  site.  On physical examination the heart rate was regular, lungs were  clear.  Abdomen was soft with active bowel sounds.  The incision were  clean and dry with no signs of infection.  Extremities had positive  Doppler in the PT and DP, peroneal and the patient's graft was patent.  The assessment and plan at this time felt that the fever increasing last  night was probably secondary to __________ .  Encouraged pulmonary  __________ .  The plan was to follow up on the white blood count in the  morning and urinalysis.  The patient continued to increase ambulation in  halls and discharge was possibly for Monday.   On postoperative day #4, August 24, 2007, the patient's blood pressure  was 106/70, heart rate 80 to 100, sinus rhythm.  Her temperature was at  100.6, which was decreased from yesterday where it was at 102.  Room air  was 95%.  White blood count was 11.6 which had decreased from yesterday  at 14.9.  Hemoglobin was at 10.2. Hematocrit was 30.1 and platelet count  was 213,000.  Fasting glucose was at 139.  Urinalysis showed small  leukocytes, negative nitrates, 1+ __________ 0-2.   The patient still feels a little bit light headed with ambulation, not   really better or worse since Sunday.  The patient was able to ambulate  in the hallways yesterday.  The patient does have positive __________  drainage.  On physical examination, the patient's heart rate is regular  rate and rhythm.  Lungs are clear.  Abdomen is soft and nontender.  Extremities:  The incisions are clean and dry with no signs of  infection.  Positive Doppler bilaterally on PT and DP.  The assessment  and plan at this time is patient has continued to remain stable and we  will possibly discharge either today or in the morning.  The light  headedness we feel is mild and with no presyncope.  Her hematocrit and  hemoglobin overall are stable.  There is sinus congestion which possibly  could be contributing and we will just continue to monitor her light  headedness for now.  The patient does not think she will have any  problems at home.  Her fever overnight is better.  Urinalysis is  essentially unchanged __________ small leukocytes.  After a discussion  with the patient of her status, as long as she continues to remain  stable and afebrile, the plan is for the patient to either go home this  afternoon or in the morning.  The patient agrees to this plan.   DISCHARGE MEDICATIONS:  1. Toprol XL 100 mg.  2. Neurontin 300 mg two tablets t.i.d.  3. Norvasc 10 mg daily.  4. Nitroglycerin 0.4 sublingual p.r.n.  5. Vytorin 10/40 mg daily.  6. Celexa 10 mg daily.  7. Flexeril 10 mg nightly.  8. Hydrochlorothiazide 25 mg daily.  9. Omeprazole 20 mg two tablets daily.  10.__________ 200 mg b.i.d.  11.Vicodin 5/500 mg nightly p.r.n.  12.Aspirin 81 mg daily.  13.New -  Percocet 5/325 mg one to two tablets every 4 hours as needed      for pain.   DISCHARGE INSTRUCTIONS:  The patient's discharge is indicated for either  this afternoon, August 24, 2007 or August 25, 2007 per [name] and  the patient is in agreement if the patient remains afebrile and vital  signs stable.  The  patient was instructed to clean wounds gently with  soap and water.  Use dry gauze to groin incisions and change daily as  needed.  The patient is also encouraged to increase her activity slowly.  She may shower.  The patient is also not to drive for two to three  weeks.  The patient is also instructed to call if fever is greater than  101, redness, drainage, pus in the incision or increased pain in the  legs.  The patient is also instructed that she needs to return to Dr.  Edilia Bo in three weeks with Doppler studies.  The patient is instructed  to continue her home medications.  The patient is also instructed that  she may take Percocet 5/325 one to two tablets every 4 hours as needed  for pain.  Note:  Patient  was instructed do not take within 4 hours of  taking hydrocodone or Vicodin.  The patient stated that she agreed and  understood these instructions and that possibly patient will be  discharged to home on August 24, 2007 or August 25, 2007 as long as  she remains stable and afebrile.      Cyndy Freeze, PA      Di Kindle. Edilia Bo, M.D.  Electronically Signed    ALW/MEDQ  D:  08/24/2007  T:  08/24/2007  Job:  [id number]

## 2010-12-25 NOTE — Letter (Signed)
August 09, 2007    Di Kindle. Edilia Bo, M.D.  [address]  [place], [place] [address]   RE:  ABBI, MANCINI  MRN:  [id number]  /  DOB:  12-26-1950   Dear [name],   As you know, [name] is a 60 year old woman with peripheral arterial  disease.  She has an occlusion of the left common iliac artery and  severe intermittent claudication.  After reviewing her treatment  options, I referred her for possible femoral-femoral bypass.  This  letter is in regard to her surgical clearance.   I have reviewed her history in detail, and I have also seen her several  times over the past 6 months.  [name]' main complaint has continued  to be left leg pain.  She has had intermittent chest pains that are  longstanding.  There has been no change in her symptoms.  She underwent  a cardiac catheterization back in 2003, that showed minimal coronary  artery disease.  She had minor nonobstructive plaque in the LAD, and  luminal irregularities in the right coronary artery.  Her LV function  has been normal.   Her most recent ischemia assessment was on July 17, 2006, when she  underwent an adenosine Myoview study.  This demonstrated normal  contractility and thickening in all areas of the myocardium, with a  gated LVEF of 60%.  She had normal myocardial perfusion.   Based on her minimal coronary artery disease at cardiac catheterization  and normal stress study one year ago, she is at low risk of any cardiac  problems with surgery.  Her most significant problem relates to  hypertension.  I would recommend continuing her antihypertensives  without interruption through the perioperative period.  At her most  recent  assessment, her anti-hypertensive regimen consisted of metoprolol  succinate 100 mg daily, Norvasc 10 mg daily, and hydrochlorothiazide 25  mg daily.   I hope this information is helpful for her preoperative clearance.  If  you need any other information, please feel free  to contact me at any  time.    Sincerely,      Veverly Fells. Excell Seltzer, MD  Electronically Signed    MDC/MedQ  DD: 08/09/2007  DT: 08/09/2007  Job #: [id number]

## 2010-12-25 NOTE — Consult Note (Signed)
NEW PATIENT CONSULTATION   [name], [name]  DOB:  1951-07-13                                       08/04/2007  EAVWU#:[id number]   I saw [name] in the office today in consultation concerning her left  lower extremity claudication.  She was referred by Dr. Excell Seltzer.  This is  a pleasant 60 year old woman who approximately 6 months ago began  developing gradual onset of pain in her left hip, thigh, and calf  associated with ambulation and relieved with rest.  This occurs at  approximately 1 to 2 blocks.  Over the last 6 months, her symptoms have  gradually progressed.  She also has some claudication in the right calf  to a lesser degree involving the thigh and hip.  In addition, she has  significant osteoarthritis involving both knees and certainly some of  her pain could be related to this.  However, on the left side, she has  developed rest pain.  She has no history of nonhealing ulcers.   Dr. 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  This was a long segment total occlusion and she was not an  ideal candidate for endovascular intervention.  After discussion with  [name], they elected to consider surgical revascularization.   PAST MEDICAL HISTORY:  Significant for obesity, hypertension, and  hypercholesterolemia.  She denies any history of diabetes, history of  previous myocardial infarction, history of congestive heart failure, or  history of COPD.   FAMILY HISTORY:  There is no history of premature cardiovascular  disease.   SOCIAL HISTORY:  She is married.  She has 3 children.  She had smoked 2  packs per day of cigarette but quit 8 years ago.   ALLERGIES:  Sulfa drugs.   MEDICATIONS:  1 Toprol XL 100 mg 1 tablet p.o. daily.  2 Neurontin 300 mg 2 tablets p.o. t.i.d.  3 Norvasc 10 mg 1 tablet p.o. daily.  4 Nitroglycerin 0.4 mg sublingual 1 tablet by  mouth every 5 minutes  p.r.n. x 3 for chest pain.  5 Vytorin 10/40 mg tabs 1 tablet by mouth daily p.o.  6 Celexa 10 mg 1 p.o. daily.  7 Flexeril 5 mg p.o. nightly.  8 Hydrochlorothiazide 25 mg p.o. daily.  9 Omeprazole 20 mg 2 tablets by mouth daily.  10 Pletal 100 mg p.o. b.i.d.  11 Vicodin 5/500 one p.o. nightly p.r.n.  12 Baby aspirin 81 mg p.o. daily.   REVIEW OF SYSTEMS:  GENERAL:  She has had no recent weight loss, weigh  gain, problems with her appetite, fever.  She is 210 pounds, 5 feet 2  inches tall.  CARDIAC:  She has occasional chest pain which has been stable.  There  has been no significant change in the character of her pain.  She does  admit to dyspnea on exertion.  She has had no recent arrhythmias.  PULMONARY:  She has had no recent productive cough, bronchitis, asthma,  or wheezing.  GI:  She has had no change in her bowel habits and has no history of  peptic ulcer disease.  GU:  She has had no dysuria or frequency.  VASCULAR:  She has left leg claudication or rest pain in the left foot  with some mottled right leg claudication.  She has no history of stroke,  TIAs, expressive or receptive aphasia or amaurosis fugax.  She has had  no history of DVT or phlebitis.  NEURO:  She has had no dizziness, black outs, headaches, or seizures.  ORTHO/SKIN:  She does have arthritis of both knees and also to some  degree of her back.  She has no muscle pain or rash.  PSYCHIATRIC:  She has had no depression or nervousness.  HEMATOLOGIC:  She has had no bleeding problems or clotting disorders.   PHYSICAL EXAMINATION:  This is a pleasant 60 year old woman who appears  her stated age.  Blood pressure is 178/106, heart rate of 69.  HEENT:  Extraocular motions are intact.  Her neck is supple.  There is no  cervical lymphadenopathy.  I did not detect any carotid bruits.  Lungs  are clear bilaterally to auscultation.  On cardiac exam, she has a  regular rate and rhythm.  Her  abdomen is soft and nontender.  Her  abdomen is obese and somewhat difficult to assess.  She has a palpable  right femoral pulse.  I could not palpate a left femoral pulse.  I could  not palpate pulses on either feet, although she does have monophasic  Doppler signals on both feet with no ischemic ulcers.  She has no  significant lower extremity swelling.  She has no ulcers or rashes.  Neurologic exam is nonfocal.   I have reviewed her arteriogram.  She has a left iliac artery occlusion  with reconstitution of the external iliac artery on the left.  Patent  superficial femoral artery and popliteal artery with three vessel run  off on the left.  On the right side, she had a patent inferior inguinal  vessels.   I have explained that I think the safer surgical option will be fem-fem  bypass and graft.  I think aortofemoral bypass grafting will be  associated with significantly high risk and given that the right iliac  artery appears to be widely patent.  I do not think this will be  necessary.  Certainly, she is at slight higher risk for wound healing  problems and graft infection given her obesity but she feels that her  symptoms are significantly disabling.  As her symptoms are now  progressed to the point at rest pain, she is developing limb threatening  ischemia.  We have discussed potential complications of surgery  including the risk of wound healing problems and graft infection.  She  understands the chance of the graft remaining patent at 70% at 5 years.   Given her history of intermittent chest pain, we will tentatively  schedule her surgery for January 8 pending discussion with Dr. Excell Seltzer to  be sure that he thinks it is safe from a cardiac standpoint.  If she  needs any further cardiac testing prior to surgery, we can certainly  delay her surgery as needed.   Di Kindle. Edilia Bo, M.D.  Electronically Signed   CSD/MEDQ  D:  08/04/2007  T:  08/05/2007  Job:  [id number]

## 2010-12-25 NOTE — Op Note (Signed)
[name], [name]                 ACCOUNT NO.:  [id number]   MEDICAL RECORD NO.:  [id number]          PATIENT TYPE:  INP   LOCATION:  2853                         FACILITY:  [facility]   PHYSICIAN:  Veverly Fells. Excell Seltzer, MD  DATE OF BIRTH:  1951/07/12   DATE OF PROCEDURE:  04/24/2007  DATE OF DISCHARGE:                               OPERATIVE REPORT   PROCEDURES:  1. Suprarenal abdominal aortic angiography.  2. Distal aortic angiography with bilateral runoff to the feet.  3. Star Close of the right femoral artery.   INDICATIONS:  [name] is a 60 year old woman with intermittent  claudication involving the left leg.  She has left thigh and calf pain  with ambulation.  Her noninvasive studies have demonstrated significant  proximal disease in the aortoiliac region on the left.  She has minimal  right-sided symptoms.  I attempted to treat her medically with Pletal  and a walking program  but she has continued to have symptoms, so we  elected to perform angiography.   PROCEDURAL DETAILS:  The risks and indications of the procedure were  reviewed with the patient.  Informed consent was obtained.  The right  groin was prepped, draped and anesthetized with 1% lidocaine.  Using  modified Seldinger technique, a 5-French sheath was placed in the right  femoral artery.  A pigtail catheter was advanced to the suprarenal  aorta, where an aortogram was performed via power injection.  The  pigtail catheter was then brought down to the distal aorta and an  oblique imaging of distal aorta and left common iliac artery was  performed to assess that region of total occlusion.  Following that a  distal aortogram with runoff to the feet was performed bilaterally.  At  the conclusion of the procedure a Star Close device was used to seal the  right femoral arteriotomy.   FINDINGS:  The distal abdominal aorta has minimal irregularity but no  aneurysm or significant stenosis.  There is a single right renal  artery  that is widely patent.  There is a single left renal artery that has a  nonobstructive 30% stenosis involving the ostium and proximal portion of  that vessel.   On the right side the common iliac artery is patent with minor  irregularity and approximately 20% stenosis.  The right internal iliac  artery is patent with luminal irregularities.  The right external iliac  and common femoral arteries are patent.  The right profunda femoris is  patent without significant stenosis.  The SFA has nonobstructive plaque  throughout with 40-50% stenoses of the mid and distal portion of the  vessel.  The popliteal is patent.  The posterior tibial and peroneal  arteries are patent and extend to the feet.  The anterior tibial artery  is not well-seen because of the patient's position.   On the left, the common iliac artery is occluded at its origin.  It  appears to be a flush occlusion.  I am not able to visualize the origin  of the artery even on angulated views.  The vessel reconstitutes via  collaterals just above the bifurcation of the internal and external  iliac arteries.  The internal iliac artery is patent, as is the external  iliac.  The common femoral artery is widely patent.  The SFA and  profunda are widely patent.  The SFA has no significant stenosis.  The  popliteal is patent.  The infrageniculate vessels including the anterior  tibial, peroneal and posterior, are all patent with runoff to the foot.   ASSESSMENT:  1. 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  2. Mild right superficial femoral artery stenosis.  3. At last two-vessel runoff onto the foot bilaterally.   PLAN:  I would recommend continued medical therapy and a walking program  for [name]  I did not elect to attempt recanalization of her  occluded common iliac. It that extends all the way to the aorta and I  did not think it was favorable for successful intervention.  I am not  sure that her symptoms  warrant an open surgical consideration at this  point and would favor ongoing observation.      Veverly Fells. Excell Seltzer, MD  Electronically Signed     MDC/MEDQ  D:  04/24/2007  T:  04/24/2007  Job:  [id number]

## 2010-12-25 NOTE — H&P (Signed)
[name], [name] NO.:  [id number]   MEDICAL RECORD NO.:  [id number]          PATIENT TYPE:  INP   LOCATION:  2009                         FACILITY:  [facility]   PHYSICIAN:  Di Kindle. Edilia Bo, M.D.DATE OF BIRTH:  [birth year]   DATE OF ADMISSION:  09/01/2007  DATE OF DISCHARGE:                              HISTORY & PHYSICAL   DATE OF PLANNED SURGERY:  September 02, 2007.   REASON FOR ADMISSION:  Infected fem-fem bypass graft.   This is a pleasant 60 year old woman whom I had seen in consultation on  August 04, 2007, with a chronic left common iliac artery occlusion.  She had been referred by Dr. Excell Seltzer.  She had developed a gradual onset  of pain in her left hip, thigh and calf associated with ambulation and  relieved with rest approximately 6 months ago.  This occurred at  approximately one to two blocks.  Her symptoms had been gradually  progressing.  She also has osteoarthritis of both knees.  She had an  arteriogram by Dr. Excell Seltzer in September 2008, which demonstrated a total  occlusion of the left common iliac artery at its origin.  This was a  long segment occlusion and she was not felt to be a good candidate for  an endovascular intervention.  As her symptoms had become quite  disabling, she elected to proceed with revascularization.  Given her  significant obesity and risk for infection, I felt the best option was  fem-fem bypass grafting and that she was not a good candidate for  aortofemoral bypass grafting.  In addition she had really no significant  inflow disease on the right.   She underwent a right-to-left fem-fem bypass graft with an 8-mm Dacron  graft on August 20, 2007.  She did well postoperatively and had been  discharged to home but returned today as she developed significant pain  in the right groin and had noted a low-grade fever.  In the office she  was noted to have swelling, redness and warmth in the right groin  consistent with  infection.  I probed the incision and a large amount of  cloudy lymphatic fluid was drained, consistent with a right groin  infection.  She was admitted for IV antibiotics and for potential  removal of her infected fem-fem graft.   PAST MEDICAL HISTORY:  Significant for obesity, hypertension, and  hypercholesterolemia.  She denies any history of diabetes, history of  previous myocardial infarction, history of congestive heart failure or  history of COPD.   FAMILY HISTORY:  There is no history of premature cardiovascular  disease.   SOCIAL HISTORY:  She is married.  She has 3 children.  She had smoked 2  packs per day of cigarettes but quit 8 years ago.   ALLERGIES:  SULFA DRUGS.   MEDICATIONS:  1. Toprol XL 100 mg p.o. daily.  2. Neurontin 300 mg 2 tablets p.o. t.i.d.  3. Norvasc 10 mg p.o. daily.  4. Vytorin 10/40 mg 1 tablet p.o. daily.  5. Celexa 10 mg p.o. daily.  6. Flexeril 10 mg p.o.  q.h.s. p.r.n.  7. Hydrochlorothiazide 25 mg p.o. daily.  8. Omeprazole 40 mg p.o. daily.  9. Pletal 100 mg p.o. b.i.d.  10.Aspirin 81 mg p.o. b.i.d.  11.Tylox 1 to 2 q.4 h p.r.n. pain.   REVIEW OF SYSTEMS:  GENERAL:  She has had a low-grade fever.  She did  not document this.  She is 210 pounds, 5 feet 2 inches tall.  CARDIAC:  She has some occasional chest pain, but this has been stable.  She has  not required nitroglycerin recently.  She denies any history of dyspnea  on exertion and has had no recent arrhythmias.  PULMONARY:  She has had  no recent productive cough, bronchitis, asthma or wheezing.  GI:  She  has had no recent change in her bowel habits and has no history of  peptic ulcer disease.  GU:  She has had no dysuria or frequency.  VASCULAR:  Since her bypass, she has had no claudication on the left.  Her activity has been fairly limited.  She has had no rest pain or  nonhealing ulcers.  She has had no history of stroke, TIAs, expressive  or receptive aphasia or amaurosis  fugax.  She has had no history of DVT  or phlebitis.  NEUROLOGIC:  She has had no dizziness, blackouts,  headaches or seizures.  ORTHO:  She does have arthritis of both knees  and also some arthritis in her back.  She has had no muscle pain or  rash.  PSYCHIATRIC:  She has had no depression or nervousness.  HEMATOLOGIC:  She has had no bleeding problems or clotting disorders.   PHYSICAL EXAMINATION:  GENERAL:  This is a pleasant 60 year old woman  who appears her stated age.  VITAL SIGNS:  Temperature in the office is 98.8.  Blood pressure 155/95,  heart rate is 77, saturation 100%.  HEENT:  The neck is supple.  There is no cervical lymphadenopathy.  LUNGS:  Clear bilaterally to auscultation.  CARDIAC:  She has a regular rate and rhythm.  ABDOMEN:  Obese.  EXTREMITIES:  She has warmth and redness in the right groin.  I probed  the incision, and there was large amount of lymphatic drainage which was  cloudy with no specific odor.  The left groin incision was healed  nicely.  She had a palpable dorsalis pedis pulse in the left with a  diminished dorsalis pedis pulse in the right.  She had moderate  bilateral lower extremity swelling.   Of note, her preoperative ABIs before her bypass were 98% on the right  and 54% on the left.  Post fem-fem bypass grafting, ABI on the left  improved to 85%, ABI on the right dropped to 82%.   IMPRESSION:  This patient presents with warmth and redness in the left  groin consistent with infection.  Given her obesity, this is not totally  unexpected.  I think the best option will be to explore the groin and  she will likely require removal of her fem-fem graft with vein patch  angioplasty of both femoral arteries.  I have discussed this with her.  She understands that from a vascular standpoint she will be back to  where she started before her bypass, but I really do not see any other  options as she has evidence of significant right groin infection that   likely involves her graft.  She will be admitted, placed on IV Ancef,  and if she has any cardiac issues during this  admission, we will ask for  Dr. Earmon Phoenix assistance.      Di Kindle. Edilia Bo, M.D.  Electronically Signed     CSD/MEDQ  D:  09/01/2007  T:  09/01/2007  Job:  [id number]   cc:   Veverly Fells. Excell Seltzer, MD

## 2010-12-25 NOTE — Procedures (Signed)
LOWER EXTREMITY ARTERIAL EVALUATION-SINGLE LEVEL   INDICATION:  Follow up removal of femoral-femoral bypass graft, which  was infected.   HISTORY:  Diabetes:  No.  Cardiac:  No.  Hypertension:  Yes.  Smoking:  Quit eight years ago after smoking two packs per day for 30  years.  Previous Surgery:  Removal of infected femoral-femoral bypass graft with  bilateral CFA VPA 09/02/07 [name].   RESTING SYSTOLIC PRESSURES: (ABI)                          RIGHT                LEFT  Brachial:               134                  140  Anterior tibial:        110                  60  Posterior tibial:       130 (0.93)           78 (0.56)  Peroneal:  DOPPLER WAVEFORM ANALYSIS:  Anterior tibial:        Triphasic            Monophasic  Posterior tibial:       Triphasic            Monophasic  Peroneal:   PREVIOUS ABI'S:  Date:  RIGHT:  LEFT:   IMPRESSION:  1. Lower extremity arterial mild disease.  2. Left arterial occlusive disease, questionable level, with limited      examination (left common iliac artery occlusion by arteriogram).   ___________________________________________  Di Kindle. Edilia Bo, M.D.   DP/MEDQ  D:  09/15/2007  T:  09/16/2007  Job:  [id number]

## 2010-12-25 NOTE — Assessment & Plan Note (Signed)
OFFICE VISIT   [name], [name]  DOB:  03/11/1951                                       08/31/2009  [id number]#:[id number]   I saw the patient in the office today for continued followup of her  peripheral vascular disease.  She underwent a right to left fem-fem  bypass graft with an 8 mm Dacron graft in January of 2009.  A year later  she had presented with an infection in her right groin and required  removal of her infected fem-fem graft with vein patch angioplasty of  bilateral common femoral arteries on 09/02/2007.  I have been following  her stable claudication.  Since I saw her last she continues to have  claudication in both calves associated with ambulation and relieved with  rest.  Her symptoms have been stable over the last 6 months.  She has no  significant thigh or hip claudication.  She has no associated symptoms.  She does have some back pain and paresthesias in her legs related to  this.   SOCIAL HISTORY:  She quit smoking 11 years ago.   REVIEW OF SYSTEMS:  CARDIAC:  She does have occasional chest pain and  plans on following up with her primary care physician at [facility].  She has had no palpitations or arrhythmias.  She has had no  history of stroke, TIAs or amaurosis fugax.  She has had no history of  DVT or phlebitis.  PULMONARY:  She has had no productive cough, bronchitis, asthma or  wheezing.   PHYSICAL EXAMINATION:  General:  This is a pleasant 60 year old woman  who appears her stated age.  Vital signs:  Blood pressure is 135/88,  heart rate 65, saturation 98%.  Lungs:  Are clear bilaterally to  auscultation without rales, rhonchi or wheezing.  Cardiovascular:  I do  not detect any carotid bruits.  She has a regular rate and rhythm  without murmur or gallop appreciated.  She has palpable radial pulses.  I cannot palpate femoral, popliteal or pedal pulses on either side.  Abdomen:  Soft and nontender with normal pitched  bowel sounds.  Neurologic:  She has no focal weakness in the upper extremities or lower  extremities bilaterally.  Skin:  She has no ulcers or rashes.   I independently interpreted her arterial Doppler study in the office  today which shows an ABI of 57% on the right and 60% on the left.  Her  ABIs have been stable compared to the study back in July of 2010 which I  reviewed.   As her symptoms are stable will simply continue with conservative  treatment.  I plan on seeing her back in 9 months and I have ordered  followup ABIs at that time.  She knows to call sooner if she has  problems.  In the meantime I have encouraged her to do as much walking  as possible and try to stay on a structured walking program.     Di Kindle. Edilia Bo, M.D.  Electronically Signed   CSD/MEDQ  D:  08/31/2009  T:  09/01/2009  Job:  [id number]

## 2010-12-25 NOTE — Discharge Summary (Signed)
[name] NO.:  [id number]   MEDICAL RECORD NO.:  [id number]          PATIENT TYPE:  INP   LOCATION:  2009                         FACILITY:  [facility]   PHYSICIAN:  [name], M.D.DATE OF BIRTH:  1951-01-01   DATE OF ADMISSION:  09/01/2007  DATE OF DISCHARGE:                               DISCHARGE SUMMARY   DATE OF DISCHARGE:  Indicated for September 08, 2007.   ADMISSION DIAGNOSIS:  Infected right-to-left femoral bypass graft.   DISCHARGE DIAGNOSIS:  Infected right-to-left femoral/femoral bypass  graft.   SECOND DISCHARGE DIAGNOSES:  1. Obesity.  2. Hypertension.  3. Hypercholesterolemia.   PROCEDURES:  Removal of infected fem/fem graft with vein patch  angioplasty of bilateral femoral arteries on September 02, 2007, by Dr.  Edilia [name]   CONSULTATIONS:  Physical therapy on September 07, 2007.   HISTORY AND PHYSICAL:  This is a 60 year old woman who Dr. Edilia [name] had  seen in consultation on August 04, 2007 with a complaint of left  common iliac artery occlusion.  She was referred by Dr. Excell [name]  She had  developed a gradual onset of pain in her left hip, thigh, and calf with  issue of ambulation, relieved with rest, approximately 6 months ago.  This occurred in approximately 1-2 blocks.  Her symptoms have been  gradually progressing.  She also has osteoarthritis of both knees.  She  had an arteriogram by Dr. 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  This was  a long segment occlusion and she was not felt to be a good candidate for  endovascular intervention.  After her symptoms had become quite  disabling, she elected to proceed with revascularization.  Given her significant obesity and risk for infection, Dr. Edilia [name] felt  the best option was fem/fem bypass graft and that she was not a good  candidate for aortofemoral bypass grafting.  In addition, she had really  no  significant in-flow disease on the right.  She underwent a right-to-left fem/fem bypass graft with an 8-mm Dacron  graft on August 20, 2007.  She did well postoperatively and had been  discharged to home but returned on September 01, 2007, because she had  developed significant pain in the right groin and had noted a low grade  fever.  In the office she was noted to have swelling, redness, and  warmth in the right groin consistent with infection.  Dr. Edilia [name] probed  the incision and a large amount of cloudy lymphatic fluid was drained  consistent with a right groin infection.  She was admitted for IV  antibiotics and the potential removal of her infected fem/fem graft.  The patient's option would be to explore the groin and likely require  removal of her fem/fem graft with vein patch angioplasty of both femoral  arteries.  Dr. Edilia [name] discussed with her and she understands from a  vascular standpoint, she [name] be back to where she started before the  bypass but he really did not see other options as she has evidence of  significant right groin infection that likely involves her graft.  The  patient agreed and she [name] be admitted to [facility], placed  on IV Ancef on September 01, 2007.  If she has any cardiac issues during  this admission, it is agreed that Dr. Earmon [name] assistance for those  issues.   HOSPITAL COURSE:  This pleasant 60 year old woman who had chronic left  common iliac occlusion was not felt to be a good candidate for  endovascular approach for this.  She had been followed by Dr. Excell [name]  She was referred for a possible bypass grafting.  Dr. Edilia [name] felt the  best option was for revascularization by a fem/fem bypass graft  secondary to obesity and high risk of infection.  Dr. Edilia [name] did not  think she was a good candidate for __________  bypass grafting.  She  presented for elective fem/fem bypass graft and did well  postoperatively.  She returned on September 01, 2007, for her first  followup visit in the office and it was noted she had a large amount of  cloudy serous drainage from the right groin with cellulitis in the right  groin consistent with graft infection.  She was brought in for IV  antibiotics to [facility] and removal of the graft on September 01, 2007.  The patient agreed to this procedure and signed consent.  The procedure was done on September 02, 2007.  The patient was taken to  the operating room on September 02, 2007, and Dr. Edilia [name] removed the  right-to-left fem/fem bypass graft.  The patient remained stable  throughout procedure and then was transferred to the PACU where she  continued to remain stable.  Later on that afternoon, the patient was  transferred to floor of 2000, where she continued to remain stable.  On post-op day September 03, 2007, day one, pain was adequately  controlled.  The patient's vital signs were stable and she was afebrile.  Her right JP drain less than 20 mL, and her left JP drain was drained at  15 mL.  The dressings were dry and both were warm with palpable right DP  and left DP, PT pulses with Doppler.  The culture was pending.  There  were no labs on this day.  This was day three on Ancef and we would  begin dressing changes and need PT's help with mobilization.  On post-op day number two, September 04, 2007, day four of Ancef.  The  patient continued to remain afebrile and vitals were stable.  Her labs:  White blood count was 11.4, hemoglobin 8.6, hematocrit 25.5, and  platelets 325.  The culture of the right groin wound showed some  moderate white blood cell count mainly PMNs, also rare epithelial cells,  and rare Gram positive cocci in pairs.  There was no growth after 2  days.  The JP drains were still placed overnight.  The left had drained  20 mL and the right 25 mL.  Upon entering the room the patient stated  that she still had some pain and felt weak but was improving.  The  patient was  alert and oriented in bed.  Physical examination of the  femoral bilateral, the JP drains were in place with dressing applied.  I  removed the dressings and the incisions looked good with no hematoma, no  erythema, and they were healing and feet were warm bilaterally.  There  was positive Doppler flow to both DPs.  The plan at this time was to  continue to ambulate.  DC drains if less than 20.  Continue to do  dressing changes b.i.d.  Continue the Ancef at this time unless final  results from culture change.  Over the weekend the patient continued to remain afebrile and vitals  were stable.  Bilaterally the groins continued to heal well.  There is  still no growth of the culture and the patient was to continue the Ancef  and dressing changes.  On September 06, 2007, the made the decision to discontinue the Ancef and  go ahead and start Keflex 500 mg three times a day.  The plan was for  the patient to be discharged either Monday or Tuesday.  On September 07, 2007, post-op day number five, the patient's vitals were 132/84 blood  pressure, heart rate 89, respirations 18, temperature 98.7, oxygen 95 on  room air.  There were no labs.  The patient stated that she felt great  but the right leg still feels a little bit weak but she is ambulating  with a walker.   PHYSICAL EXAMINATION:  GENERAL:  The patient is alert, oriented x3 in  bed.  HEART:  Heart rate was normal.  MUSCULOSKELETAL:  The groins bilaterally, incisions were dry and clean  and they looked good healing.  JP drains have been discontinued.  The  incisions still have a little bit of mild drainage from the site,  especially on the right side.  There is positive Doppler flow  bilaterally in the DPs.   ASSESSMENT/PLAN:  At this time is to continue ambulation, continue  dressing changes twice a day or as needed, discharge to home on September 08, 2007 with Keflex 500 mg orally three times a day and return for  followup with Dr.  Edilia [name] in one week to see if she needs to continue  Keflex and also to have the sutures removed and to follow up on  incision.  The patient agrees to this plan.  As long as she remains  afebrile and is stable throughout the night, then she [name] be discharged  to home in the morning.  She possibly could have been discharged today  but the patient still felt uncomfortable and we were still concerned a  little bit with the drainage.  We would like to see that decrease a  little bit more before discharging her to home.  The patient agrees with  this plan.   DISCHARGE MEDICATIONS:  1. Toprol XL 100 mg p.o. daily.  2. Neurontin 300 mg two tablets.  3. Norco 10 mg p.o. daily.  4. Vytorin 10/40 mg one tablet p.o. daily.  5. Celexa 10 mg daily.  6. Flexeril 10 mg as needed.  7. Hydrochlorothiazide 25 mg daily.  8. __________ 40  mg orally daily.  9. __________ 100 mg p.o. twice daily.  10.Aspirin 81 mg two times a day.  11.Tylox 1-2 tablets every 4 hours as needed for pain and we wrote a      prescription for that.  12.Keflex 500 mg orally three times a day until she returns for      followup with Dr. Edilia [name]   DISCHARGE INSTRUCTIONS:  The patient's discharge instructions are  indicative for September 08, 2007, if the patient continues to remain  afebrile and vitals remain stable.  The patient agrees to this discharge  and instructions and states that she understands them.  1. The patient is instructed to keep  her groins dry where the      incisions are.  Also the patient is instructed to clean with soap      and water and to change dressings at incision sites as needed.  2. The patient is to increase activity slowly.  And she is to continue      to walk with assistance.  3. The patient is also instructed that she may shower bathe and she      may lift and drive.  4. The patient is also instructed she needs to contact our office or      the ER for any increased drainage, redness, or  fever greater than      101.  5. The patient is instructed she needs to follow up with Dr. Edilia [name]      in 1 week and during this time of followup the patient [name] also      have sutures removed from the groin areas.  6. The patient was instructed to continue home medications and then      also to use Tylox as needed for pain.  We wrote another      prescription for her and Keflex antibiotics to treat infection 500      mg one tablet three times daily.  The patient again stated that she      agreed with these discharge instructions and understood them.   Discharge is indicated again for September 08, 2007, while she continues  to remain stable and afebrile.  The patient agrees to this plan to go  home.      Cyndy Freeze, PA      [name], M.D.  Electronically Signed    ALW/MEDQ  D:  09/07/2007  T:  09/07/2007  Job:  [id number]

## 2010-12-25 NOTE — Consult Note (Signed)
[name], [name]                 ACCOUNT NO.:  [id number]   MEDICAL RECORD NO.:  [id number]          PATIENT TYPE:  OBV   LOCATION:  5011                         FACILITY:  [facility]   PHYSICIAN:  Deanna Artis. Hickling, M.D.DATE OF BIRTH:  [birth year]   DATE OF CONSULTATION:  06/01/2007  DATE OF DISCHARGE:                                 CONSULTATION   TIME SEEN:  0040.   CHIEF COMPLAINT:  Cephalgia, rule out subarachnoid hemorrhage.   HISTORY OF PRESENT ILLNESS:  The patient is a 60 year old African-  American female seen in neurological assessment in the emergency  department regarding cephalgia, rule out subarachnoid hemorrhage.  The  patient indicates that at approximately 8 a.m. on May 31, 2007 while  ambulating in her home, she developed acute onset neck pain with  concomitant shortness of breath and lightheadedness.  Symptoms have  persisted all day and have worsening to the point that she now has a  prominent headache.  The patient presented to the emergency department  at approximately 4 p.m., and at that time, she obtained a CAT scan of  the brain which was dictated as unremarkable.  Given the fact that the  patient had persistent symptoms, the emergency department physician felt  that it is important to rule out a subarachnoid hemorrhage.  A lumbar  puncture was attempted by radiology.  According to the patient, it took  multiple attempts including having to sit her up.  At some point, she  was in substantial pain and emesis, and partway through the attempt,  they had to stop.  In any event, it took multiple attempts to  successfully obtain CSF.  The spinal fluid numbers, specifically in tube  1, were noted to have 27,500 red blood cells, and 37 white blood cells.  Tube 4 demonstrated 23,000 red blood cells, and 44 white blood cells.  No xanthochromia was noted.  Protein was 87, glucose was 58 with  associated serum glucose of 88.  Neurology was asked to assess the  patient for the possibility of subarachnoid hemorrhage based on these  spinal fluid numbers.   PAST MEDICAL HISTORY:  Significant for:  1. Chronic pain.  2. Atypical chest pain.  3. Hyperlipidemia.  4. Hypertension.  5. History of cervical neck pain with MRI imaging of the cervical      spine in June of 2008 which was essentially unremarkable with      exception of a small disk bulge at C3-4.  6. Bilateral degenerative joint disease in her knees, with previous      recommendation for knee replacements.  7. Cholecystectomy secondary to gallstones.  8. Cardiac catheterization in 2003.  9. Vascular insufficiency of the lower extremities.  10.GERD.  11.Left rotator cuff tear.   OUTPATIENT MEDICATIONS:  Toprol XL, Neurontin, Norvasc, nitroglycerin,  Celexa, Flexeril, hydrochlorothiazide, omeprazole, Pletal.   ALLERGIES:  LISTED AS SULFA.   SOCIAL HISTORY:  The patient resides with her husband.  She is  independent of activities with daily living.  She ambulates without any  assistive devices.  She denies tobacco, alcohol, and illicit drug  use.   FAMILY HISTORY:  Denied for subarachnoid hemorrhage or stroke at a young  age.   REVIEW OF SYSTEMS:  At present time, the patient reports left-sided neck  pain which limits her range of motion.  She states that some of the pain  radiates to her head.  She denies numbness, tingling, or weakness in her  extremities.  Originally, there was some left-sided facial numbness  which has no subsided.  She denies any weakness.  She denies any  abdominal pain, nausea, or emesis.  She denies any chest pain at this  time.   PHYSICAL EXAMINATION:  GENERAL:  The patient is awake and alert.  She  provides some cooperation with examination.  She does not appear to be  in any acute distress.  She does not appear to be confused.  VITAL SIGNS:  On presentation, temperature 99.4, blood pressure 129/95,  pulse 78, respiratory rate 16, pulse ox 95% on room  air.  HEENT EXAM:  The patient is normocephalic.  She does have substantial  increase in the paracervical muscle tone on the left, as well as in the  left sternocleidomastoid and trapezius muscle.  The patient's head is  rotated to the right.  She indicates the symptoms are worsened with  passive range of motion with regards to her neck.  Focal tenderness is  identified most prominently at the cervical occipital junction.  Oral  mucosa is moist.  There are no tongue lacerations present.  HEART:  Regular rate.  No murmurs were noted.  LUNGS:  Clear to auscultation bilaterally.  ABDOMEN:  Soft, nondistended.  EXTREMITIES:  Without any edema or cyanosis.   NEUROLOGICAL EXAM/MENTAL STATUS:  The patient is awake and alert.  She  follows all commands appropriately.  She is able to provide a history.  She does not appear to have any dysarthria or dysphagia.   CRANIAL NERVE EXAM:  Pupils equally round and reactive to light  bilaterally.  Extraocular muscle movements are intact bilaterally.  There is no nystagmus.  Facial sensation is symmetrically intact  bilaterally.  Muscles of facial expression are intact bilaterally.  There are no gross hearing deficits.  Palette elevates symmetrically.  Sternocleidomastoid strength is full.  However, pain is noted with  active shoulder shrug, most prominently on the left.   MOTOR EXAM:  There is no resting reaction tremor.  At times, the patient  has giveaway weakness in her upper extremities.  5/5 strength is  eventually obtained in the bilateral biceps, triceps, and deltoids.  Hip  flexor strength is 3/5, but it is pain limited due to the patient's  prominent bilateal knee pain.  This is chronic according to the patient,  as well as her husband at bedside.  Ankle plantar flexion and ankle  dorsiflexion are 5/5 bilaterally.   REFLEXES:  Biceps, triceps, and brachial radialis are 2-/4 bilaterally.  Patellar reflexes are trace bilaterally.  Achilles  reflexes are absent  bilaterally.  Babinski response is downgoing bilaterally.   SENSORY EXAM:  Vibratory sensation is completely intact within the  bilateral upper extremities.  Temperature sensation is symmetric in the  bilateral upper extremities and face.  Pin sensation is symmetric in the  bilateral upper and lower extremities, as well as the bilateral face.   CEREBELLAR EXAM:  Finger-to-nose and rapid alternating movements are  grossly intact bilaterally.  There is some past-pointing, which is most  likely effort related.   CT imaging of the brain performed in the emergency  department was  unremarkable by report, and was personally reviewed.   LABORATORY ASSESSMENT:  White blood cell count 7.9, hemoglobin 13.3,  hematocrit 40, platelet count of 281,000.  PTT 27, PT 12.6, INR 0.9.  Sodium 138, potassium 3.7, chloride 99, CO2 28, glucose 88, BUN 8,  creatinine 0.81, calcium 9.9.  Spinal fluid results as detailed above.  Tube one, 27,500 red blood cells, 37 white blood cells.  Tube four,  23,000 red blood cells, 44 white blood cells.  CSF protein 87.  CSF  glucose 58 with serum glucose of 88.   IMPRESSION:  1. The patient is a 60 year old African-American female who developed      sudden onset neck pain at 8 a.m. while ambulating.  Symptoms were      accompanied by lightheadedness and nausea.  Symptoms persisted to      include headache.  The patient brought to the emergency department      where radiology attempted lumbar puncture.  After multiple      attempts, lumbar puncture was obtained which revealed tube 1 to      have 27,500 red blood cells and 37 white blood cells.  Tube four,      23,000 red blood cells and 44 white blood cells.  No xanthochromia      noted.  Protein was 87, glucose was 58, serum glucose was 88.      Neuro exam at present reveals an awake patient with giveaway      weakness at times.  With encouragement, the patient has 5/5      strength in the  bilateral upper extremities, and 3/5 in the      bilateral proximal lower extremities (chronic secondary to knee      pain), with full strength in the bilateral distal lower      extremities.  Pupils equally round and reactive to light      bilaterally.  No focal deficits noted.  Prominent torticollis      noted, which is acute per the patient.  Suspect associated muscle      tension cephalgia given patient with a history of similar, although      less severe, neck pain which had warranted an MRI of the cervical      spine in June of 2008.  Less likely subarachnoid hemorrhage.  2. Chronic pain.  3. History of atypical chest pain.   PLAN:  1. CT angiogram of the brain to rule out aneurysm or arteriovenous      malformation.  If negative, then no further inpatient neurological      recommendation.  2. Pain medications.  The patient is on Vicodin at home chronically.  3. Muscle relaxants.  The patient is on Flexeril at home.  4. Will check CT angiogram when completed.   Thank you for allowing me to participate in the care of your patient.  If at any point should you have any questions, please feel free to  contact me.      Suzzette Righter, DO   Electronically Signed     ______________________________  Deanna Artis. Sharene Skeans, M.D.    RH/MEDQ  D:  06/01/2007  T:  06/01/2007  Job:  [id number]

## 2010-12-25 NOTE — Assessment & Plan Note (Signed)
OFFICE VISIT   [name], [name]  DOB:  07/12/1951                                       02/28/2009  ZOXWR#:[id number]   I saw the patient in the office today for continued followup of her  peripheral vascular disease.  She had undergone a previous right-to-left  fem-fem bypass graft for a left iliac artery occlusion.  This later had  to be removed for infection.  I have been following with her stable  peripheral vascular disease.  Since I last saw her 9 months ago, she  continues to have pain in both legs.  This pain occurs with standing and  sitting and also with ambulation.  She can only walk a very short  distance before experiencing hip and calf claudication.  She also has a  history of low back pain.  I do not get any clear-cut history of rest  pain, although she does get cramps in her legs at night.  She has had no  history of nonhealing wounds.   REVIEW OF SYSTEMS:  She denies any history of chest pain, chest  pressure, palpitations or arrhythmias.  She has had no productive cough,  bronchitis, asthma or wheezing.   PHYSICAL EXAMINATION:  This is a pleasant 60 year old woman who appears  her stated age.  Blood pressure is 141/89, heart rate is 74.  Neck is  supple.  I do not detect any carotid bruits.  Lungs are clear  bilaterally to auscultation.  On cardiac exam, she has a regular rate  and rhythm.  Abdomen is obese and difficult to assess.  She has a  palpable right femoral pulse.  I cannot palpate a left femoral pulse.  Both feet appear adequately perfused.  She has monophasic Doppler  signals in the posterior tibial and dorsalis pedis position bilaterally.   Doppler studies in our office today show an ABI of 46% on the right and  57% on the left.   I think that the patient's pain is related both to her multilevel  arterial occlusive disease and also to her back pain, and certainly some  of her pain is neurogenic.  I have explained that even  with  revascularization, I think the pain she is having with standing and  sitting would not be relieved.  I have offered her the option of  proceeding with arteriography to reassess her for revascularization;  however, currently she would like to not proceed with this.  I think it  would be worth considering having her evaluated by a neurologist to work  up for back pain as this might help her be able to get on a structured  walking program to help with her claudication symptoms.  Fortunately,  she is not a smoker.  I plan on seeing her back in 6 months.  If her  symptoms progress, certainly we can schedule her for an arteriogram and  evaluate her for a redo revascularization.  She is at increased risk for  infection clearly because of her weight.   Di Kindle. Edilia Bo, M.D.  Electronically Signed   CSD/MEDQ  D:  02/28/2009  T:  03/01/2009  Job:  [id number]   cc:   [facility]  Dr. Excell Seltzer

## 2010-12-25 NOTE — Assessment & Plan Note (Signed)
OFFICE VISIT   [name], [name]  DOB:  01-Jan-1951                                       05/10/2008  [id number]#:[id number]   I saw the patient in the office today for continued followup of her  peripheral vascular disease.  She had undergone a right to left fem-fem  bypass graft for a chronic left common iliac artery occlusion in January  of this year.  Unfortunately, soon after she required removal of her  graft because she developed an infection in the right groin and an  infected fem-fem graft.  Since I saw her last, she continues to have  pain in both legs.  She describes burning pain and paresthesias in both  feet.  She also has pain in her hips, thighs and calves bilaterally.  However, this oftentimes occur simply with standing.  The symptoms are  essentially equal on both sides.  She also has significant cramps at  night.  I did get some history of claudication on the left side and  possibly some early rest pain in the left foot.  Fortunately, she is not  a smoker.   REVIEW OF SYSTEMS:  She has had no recent chest pain, chest pressure,  palpitations or arrhythmias.  She has had no bronchitis, asthma or  wheezing.   PHYSICAL EXAMINATION:  This is a pleasant 60 year old woman who appears  her stated age.  Blood pressure is 117/113, heart rate is 72.  Neck is  supple.  There are no carotid bruits.  Lungs are clear bilaterally to  auscultation.  Cardiac exam she has a regular rate and rhythm.  Abdomen  is obese and difficult to assess.  Has a palpable right femoral pulse.  I cannot palpate a left femoral pulse.  She does have a palpable right  dorsalis pedis pulse.  I cannot palpate pulses on the left.   She has no ischemic ulcers on her feet.  She has no significant lower  extremity swelling.   Doppler study today shows an ABI of 98% on the right with triphasic  Doppler signals in the right foot.  On the left side she has an ABI of  53% with monophasic  Doppler signals on the left.  Her ABI has not  changed since February.   With respect to her cramps at night, I have recommended that she try  some quinine water before bed time.  With respect to the pain in her  legs, which occurs also with standing, I think this could very well be  related to her lumbar disk disease.  In addition, the burning pain and  paresthesias in the feet are more consistent with paresthesias.  The  fact that she has symptoms which are essentially the same on both sides  would suggested that her symptoms are not attributed to her peripheral  vascular disease.  She has a palpable dorsalis pedis pulse on the right  with a normal ABI.  I think some of her symptoms on the left could be  attributed to her peripheral vascular disease.  However, ABIs remain  stable.  I have simply encouraged her to stay as active as possible and  we would not consider redo fem-fem bypass grafting unless her symptoms  became disabling.  Given her weight and back problems, I suggested she  might consider water aerobics or  some other type of exercise besides  walking.   I am going to schedule her for followup in 9 months and I will see her  back at that time.  She knows to call sooner if she has problems.   Di Kindle. Edilia Bo, M.D.  Electronically Signed   CSD/MEDQ  D:  05/10/2008  T:  05/11/2008  Job:  [id number]   cc:   Veverly Fells. Excell Seltzer, MD

## 2010-12-25 NOTE — Assessment & Plan Note (Signed)
OFFICE VISIT   VEGAS, COFFIN  DOB:  [birth year]                                       09/15/2007  [id number]#:[id number]   I saw the patient in the office today for followup after recent removal  of her infected fem-fem bypass graft.  This is a pleasant 60 year old  woman who had a chronic left common iliac artery occlusion.  She had  been followed by Dr. Excell Seltzer and was not a good candidate for an  endovascular approach.  She had a right to left fem-fem bypass graft on  08/20/2007, but later developed swelling, erythema, and drainage from  the right groin, consistent with infection.  Her infected graft was  removed on 09/02/2007 and she has done well since that time, maintaining  adequate flow to the left foot.   She comes in for her first followup visit.  She has no specific  complaints.   PHYSICAL EXAMINATION:  Temperature is 98.6, blood pressure 135/91.  Her  incisions in both groins have healed nicely.  She has warm, well-  perfused feet with an ABI of 93% on the right with triphasic Doppler  signals.  On the left side, she has monophasic Doppler signals with an  ABI of 56%.   Overall, I am pleased with her progress, and I have encouraged her to  ambulate as much as possible.  I plan on seeing her back in 6 months.  She knows to call sooner if she has problems.   Di Kindle. Edilia Bo, M.D.  Electronically Signed   CSD/MEDQ  D:  09/15/2007  T:  09/17/2007  Job:  [id number]   cc:   Veverly Fells. Excell Seltzer, MD

## 2010-12-25 NOTE — Op Note (Signed)
[name], [name]                 ACCOUNT NO.:  [id number]   MEDICAL RECORD NO.:  [id number]          PATIENT TYPE:  INP   LOCATION:  2550                         FACILITY:  [facility]   PHYSICIAN:  Di Kindle. Edilia Bo, M.D.DATE OF BIRTH:  12-22-1950   DATE OF PROCEDURE:  08/20/2007  DATE OF DISCHARGE:                               OPERATIVE REPORT   PREOPERATIVE DIAGNOSIS:  Left iliac artery occlusion.   POSTOPERATIVE DIAGNOSIS:  Left iliac artery occlusion.   PROCEDURE:  Right-to-left femoral-femoral bypass with an 8-mm Dacron  graft.   SURGEON:  Di Kindle. Edilia Bo, M.D.   ASSISTANT:  Jerold Coombe, P.A.   ANESTHESIA:  General.   INDICATIONS:  This is a 60 year old woman with obesity who had presented  with left lower extremity claudication and seen by Dr. Excell Seltzer.  She  underwent arteriogram which showed a long segment right iliac artery  occlusion, and she was sent for consideration for revascularization.  After extensive discussion about the options, we ultimately elected to  proceed with a right-to-left fem-fem bypass graft.  My biggest concern  obviously with her weight was infection.  We discussed the procedure and  potential complications in detail.   TECHNIQUE:  The patient was taken to the operating room and received a  general anesthetic.  The groins, lower abdomen and thighs were prepped  and draped in usual sterile fashion.  Oblique incision was made in the  right groin just below the inguinal ligament.  The common femoral,  superficial femoral and deep femoral arteries were dissected free.  Of  note, there was a StarClose device on the distal common femoral artery  slightly laterally.  Through an oblique incision again above the  inguinal crease on the left, the left common femoral, superficial  femoral and deep femoral arteries were dissected free.  Again on both  sides these wounds were quite deep.  A 8-mm graft was then tunneled in  an inverted U  fashion between the 2 incisions.  The patient was then  heparinized with 9000 units of IV heparin.  Attention was first turned  to the right femoral anastomosis, which was the inflow.  The common  femoral, superficial femoral and deep femoral arteries were occluded.  The StarClose device was excised and then the arteriotomy extended with  Potts scissors.  The 8-mm graft was spatulated and sewn end-to-side to  the common femoral artery using continuous 5-0 Prolene suture.  Prior to  completing anastomosis the graft was clamped,the arteries backbleed and  flushed appropriately, and there was excellent inflow.  The anastomosis  was tied and was hemostatic.  The graft was clamped.  Next the graft was  pulled the appropriate length for anastomosis to the left common femoral  artery.  The common femoral, superficial femoral and deep femoral  arteries were controlled.  A longitudinal arteriotomy was made in the  common femoral artery.  The graft was cut to appropriate length,  spatulated and sewn end to side to the common femoral arteries with  continuous 5-0 Prolene suture.  Again prior to completing the  anastomosis the  arteries were backbleed and flushed appropriately and  anastomosis completed.  Flow was reestablished first to the left leg,  which the patient tolerated well.  Hemostasis was obtained in the  wounds.  The wounds were closed with deep layer of 2-0 Vicryl.  The  subcutaneous layer was closed with 2-0 Vicryl.  The skin  was closed with a 4-0 subcuticular stitch.  Sterile dressing was  applied.  The pannus was released and there was palpable pedal pulses at  the completion of the procedure.  The patient tolerated the procedure  well and was transferred to recovery room in satisfactory condition.  All needle and sponge counts were correct.      Di Kindle. Edilia Bo, M.D.  Electronically Signed     CSD/MEDQ  D:  08/20/2007  T:  08/20/2007  Job:  [id number]   cc:   Veverly Fells.  Excell Seltzer, MD

## 2010-12-25 NOTE — Assessment & Plan Note (Signed)
OFFICE VISIT   [name], [name]  DOB:  Jul 30, 1951                                       03/08/2010  [id number]#:[id number]   I saw the patient in the office today for continued followup of her leg  pain.  I last saw her in January of 2011.  She had undergone a previous  right to left fem-fem bypass graft with an 8 mm Dacron graft in January  of 2009.  She developed an infection in the graft and this was removed  with vein patch angioplasty of bilateral common femoral arteries on  09/02/2007.  I have been following her with bilateral lower extremity  claudication.  Since I saw her last she is continuing to have  significant pain in both legs associated with ambulation and relieved  with rest.  This involves her hips, thighs and calves bilaterally.  The  symptoms are more significant on the left side.  She also states that  she has pain in her left hip when she lies on her left side.  She does  have a history of arthritis.  I do not get any history of rest pain or  history of nonhealing ulcers.  Her claudication occurs at a fairly short  distance at one to two blocks.  She feels that these symptoms are  gradually progressing.  There are no other aggravating or alleviating  factors.   Past medical history is significant for hypertension.  She also has a  history of hypercholesterolemia and obesity.  She denies any history of  diabetes, history of previous myocardial infarction, history of  congestive heart failure or history of COPD.   SOCIAL HISTORY:  She is married.  She quit smoking 10 years ago.   FAMILY HISTORY:  There is no history of premature cardiovascular  disease.   REVIEW OF SYSTEMS:  CARDIOVASCULAR:  She has occasional chest pain which  has been stable.  She admits to some orthopnea and also some dyspnea on  exertion.  She has had no palpitations or arrhythmias.  She has had no  history of stroke, TIAs or amaurosis fugax.  She has had no history of  DVT or phlebitis.  NEUROLOGIC:  She occasionally has dizziness.  GENERAL:  She has had no weight loss or weight gain.  She is 205 pounds.  PULMONARY:  She has had bronchitis in the past.  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  GI, hematologic, GU, ENT, psychiatric, integumentary review of systems  is unremarkable and is documented on the medical history form in her  chart.   PHYSICAL EXAMINATION:  General:  This is a pleasant 60 year old woman  who appears her stated age.  Vital signs:  Her blood pressure is 160/96,  heart rate is 72, respiratory rate is 16.  HEENT:  Unremarkable.  Lungs:  Are clear bilaterally to auscultation without rales, rhonchi or  wheezing.  Cardiovascular:  I do not detect any carotid bruits.  She has  a regular rate and rhythm.  She has a palpable right femoral pulse.  I  cannot palpate a left femoral pulse.  I cannot palpate popliteal or  pedal pulses on either side.  She has had no significant lower extremity  swelling.  Abdomen:  Soft and nontender.  She has normal pitched bowel  sounds.  Musculoskeletal:  There are no  major deformities or cyanosis.  Neurologic:  She has no focal weakness or paresthesias.  Skin:  There  are no ulcers or rashes.   I have independently interpreted her arterial Doppler study today which  shows monophasic Doppler signals in the dorsalis pedis and posterior  tibial positions bilaterally.  She has an ABI of 68% on the right and  70% on the left.  These ABIs have actually increased from her study  compared in January.   I explained that I do not think all of her symptoms can be explained  from her peripheral vascular disease given that she appears to still  have a patent iliac system on the right but has significant right leg  symptoms also.  Certainly the pain she has when she lies on her left  side I do not think is related to her peripheral vascular disease.  She  does, however, have significant claudication  which she feels is  worsening.  She is quite disabled by this and would like to pursue  arteriography to see what options she might have for revascularization  or to help determine if some other cause for her pain needs to be looked  into.  We will plan on proceeding with an arteriogram on 03/19/2010.  I  have discussed the indications of the procedure and the potential  complications including but not limited to bleeding, arterial injury or  renal insufficiency.  I also explained that if we see disease amenable  to angioplasty this could potentially be addressed at the same time.  We  will make further recommendations pending the results of her  arteriogram.     Di Kindle. Edilia Bo, M.D.  Electronically Signed   CSD/MEDQ  D:  03/08/2010  T:  03/09/2010  Job:  [id number]

## 2010-12-25 NOTE — Discharge Summary (Signed)
[name], [name]                 ACCOUNT NO.:  [id number]   MEDICAL RECORD NO.:  [id number]          PATIENT TYPE:  OBV   LOCATION:  5011                         FACILITY:  [facility]   PHYSICIAN:  Madaline Guthrie, M.D.    DATE OF BIRTH:  [birth year]   DATE OF ADMISSION:  05/31/2007  DATE OF DISCHARGE:  06/04/2007                               DISCHARGE SUMMARY   DISCHARGE DIAGNOSES:  1. Neck pain, muscular in origin (possible torticollis secondary to      Celexa).  2. Hypertension.   INACTIVE DIAGNOSES:  1. Hyperlipidemia.  2. Coronary artery disease.  3. Hiatal hernia.  4. Gastroesophageal reflux disease.  5. Diverticulosis.  6. Rotator cuff, status post surgery.  7. Ovarian cyst.  8. Chronic abdominal.  9. Depression.  10.Peripheral vascular disease.   DISCHARGE MEDICATIONS:  1. Norvasc 10 mg p.o. daily.  2. Toprol XL 100 mg p.o. daily.  3. Vytorin 1 tablet p.o. daily.  4. Aspirin 81 mg p.o. daily.  5. Pletal 100 mg p.o. daily.  6. Neurontin 300 mg p.o. t.i.d.  7. Benztropine 1 mg p.o. h.s.  8. Vicodin 5/225 one to two tablets p.o. p.r.n. q.4.  9. Ativan 1 mg p.o. t.i.d.  10.Zofran 4 mg p.o. p.r.n. q.8.   DISPOSITION AND FOLLOW UP:  [name] is discharged home.  She will see  myself on June 16, 2007.  She needs to follow up for any persistent  neck pain.  She is also supposed to get physical therapy and this should  be checked.  Please note that the Celexa has been discontinued because  of the possible extrapyramidal side effect, so she might need to be on a  different antidepressant.  Also, patient is on Ativan and she needs to  follow on this as well.  We have started her on a new muscle relaxant,  which is benztropine.  She also needs to be followed on hypertension and  the hyperlipidemia.   PROCEDURES PERFORMED:  1. A lumbar puncture was obtained on May 31, 2007, which shows CSF      glucose of 58, a total CSF protein of 87, a cell count total of 37  of which 15 were neutrophils, 83 were lymphocytes, 2 were      monocytes.  RBC count was 27,500 and this was all obtained from the      first tube.  The first tube was grossly red and cloudy.  A result      of the next tube was grossly red, cloudy.  CSF the super intent was      colorless.  This was also true with the first tube and the RBC was      23,000 on the other tube with a WBC of 44 with neutrophils 32 and      lymphocytes 65%.  Similarly, monocytes were 2 and eosinophils 1%.  2. CT of head was done on May 31, 2007 without contrast.  It was      negative for any acute intracranial abnormality.  3. Cervical spine x-ray was done on May 31, 2007 which was also      negative for any acute bony abnormality and there are no static      signs for instability.  4. CT angiogram of the head was taken on October 20 with contrast,      which was negative for any significant aneurysm or dissection or      any intracranial abnormality.  5. CT of neck with contrast was done on June 01, 2007, which was      also negative for any significant pathologic finding.  6. MRI of the brain without contrast was done on June 02, 2007,      which was negative for any acute intracranial abnormality.  7. MRI/MRA of neck was done on the same date, which was negative for      any hemodynamically significant vascular stenosis in the neck, but      there was mild atherosclerotic disease involving the carotid      bifurcation.  There was also atherosclerosis of the bilateral      cavernous internal carotid artery segments without focal stenosis.  8. MRI of the cervical spine was done without contrast on June 03, 2007, which shows a stable and normal appearance of the cervical      spine without any acute process.   CONSULTATIONS:  1. Neurology consultation was obtained from the ER by the ER physician      on June 01, 2007 and Dr. Ellison Carwin was consulted.  2. Physical therapy was  also consulted.   HISTORY OF PRESENT ILLNESS:  On admission, Mr. Riggins is a 60 year old  African-American lady with a past medical history of hypertension,  hyperlipidemia, and peripheral vascular disease with status post LP to  rule out meningitis, CT head without contrast negative.  So far is  referred to the medical teaching service team for persistent neck pain.  It started 8 hours prior to admission.  He was sudden in onset.  It is  10/10 when intense and located at the back of head and at left side of  neck and made worse by lying on the left side and turning the head to  the left.  No relating factor.  No photophobia, chills, fever, rigor or  night sweats.  There is nausea and she vomited once, which is non-  bloody.  The pain is not radiating anywhere.  No history of trauma.  She  was alert when she woke up from bed one day prior to admission.   PHYSICAL EXAMINATION:  VITAL SIGNS:  Temperature 99.4, blood pressure  129/95, pulse 78, respirations 10, saturating 95% on room air.  GENERAL:  She is in no acute distress and she is nontoxic looking.  HEENT:  Eyes are anicteric.  Oropharynx moist and pink.  NECK:  Tender on the left side only, painful with movement and the range  of motion is limited.  There is no cervical or supraclavicular  lymphadenopathy.  CHECK:  Clear to auscultation bilaterally.  CARDIOVASCULAR:  First and second heart sounds normal.  Regular rate and  rhythm.  No rubs, murmurs, or gallops.  ABDOMEN:  Bowel sounds normal.  Soft, nontender, no organomegaly.  EXTREMITIES:  No pitting or pleural edema.  NEURO:  Alert and oriented x4.  Power 5/5 in all 4 limbs.  Bilateral  light touch symmetrical.  Cranial nerves II-XII normal.  Babinski  bilateral downgoing.  Bilateral finger-to-nose symmetrical.   LABORATORY DATA:  On admission, WBC  7.9, ANC 4.3, hemoglobin 13.3, MCV  6.3, platelets 281.  Sodium 130, potassium 2.7, chloride 99, bicarb 28,  BUN 8, creatinine  0.8, glucose 88.  X-ray of cervical spine negative for  any subluxation.  LP results as mentioned in the procedures.  CT scan of  head without and with contrast as mentioned in the procedure section.   HOSPITAL COURSE:  1. Basically, she was followed and some scans were obtained of the      neck to rule out an abscess or any soft tissue injury, including CT      scan and MRI which was negative for anything.  She was basically      treated conservatively with Ativan and benztropine.  Her Celexa was      stopped because of the possibility of extrapyramidal side effects.      Basically, she was ruled out for any life-threatening conditions of      the head and neck and we deemed it is most probably muscular in      origin, maybe because of the Celexa induced extrapyramidal side      effect and she is planned to be discharged home today.  She will      continue the Ativan, benztropine and she will see a physical      therapist.  She will be followed in the clinic.  2. Hypertension.  She was treated with Norvasc and Toprol, as her      blood pressure was slightly increased.  We were holding her HCTZ      during the hospitalization and most probably resume it after she is      discharged.  3. Hyperlipidemia.  We just continued the Vytorin.  4. Peripheral vascular disease.  We continued Colastosal.  5. Depression.  We held the Celexa and she will be discharged home      without it.  She will need some other antidepressant.   DISCHARGE LABS:  Sodium 140, potassium 3.7, chloride 107, CO2 30,  glucose 94, BUN 6, creatinine 0.7, calcium 8.9.  WBC 7.6, RBC 3.99,  hemoglobin 11.4, hematocrit 34.4, MCV 86.3, RDW 13.4, platelets 240.   CONDITION ON DISCHARGE:  Still some pain on the neck.   VITAL SIGNS:  Temperature 99.1, pulse 85, respirations 20, blood  pressure 130/71, saturations 97 on room air.      Jason Coop, MD  Electronically Signed      Madaline Guthrie, M.D.   Electronically Signed    YP/MEDQ  D:  06/19/2007  T:  06/19/2007  Job:  [id number]

## 2010-12-25 NOTE — Assessment & Plan Note (Signed)
OFFICE VISIT   [name], [name]  DOB:  May 31, 1951                                       05/16/2010  ZHYQM#:[id number]   I saw the patient in the office today for continued followup of her  peripheral vascular disease.  This is a pleasant 60 year old woman who  has had a previous right-to-left fem-fem bypass graft in January 2009  for a left iliac artery occlusion.  She later had this removed for  infection.  I have been following her with bilateral lower extremity  claudication.  Her symptoms had progressed and on her most recent visit  on March 08, 2010, she wished to pursue arteriography to see what options  she might have for revascularization.  She underwent an arteriogram on  March 19, 2010, which showed a left common iliac artery occlusion and a  short-segment right superficial femoral artery occlusion.  She comes in  today for further followup.  She continues to have pain in both legs  which is brought on by ambulation and relieved with rest.  However, the  symptoms also occur with standing.  She does occasionally have some low  back pain.  I do not get any history of rest pain or history of  nonhealing ulcers.  The pain involves the thighs and calves bilaterally.  The symptoms have been relatively stable and there has been no  significant increase in symptoms since her last visit.   SOCIAL HISTORY:  She is married.  She has 3 children.  She quit tobacco  11 years ago.   PAST MEDICAL HISTORY:  Significant for hypertension, obesity and  hypercholesterolemia.   REVIEW OF SYSTEMS:  CARDIOVASCULAR:  She has occasional chest pain,  which has been stable.  She also admits to some orthopnea and dyspnea on  exertion.  She has had no palpitations or arrhythmias.  PULMONARY:  She has had no productive cough bronchitis, asthma or  wheezing.   PHYSICAL EXAMINATION:  This is a pleasant 60 year old woman who appears  her stated age.  Blood pressure is 156/97,  heart rate is 61, saturation  97%.  Lungs are clear bilaterally to auscultation without rales, rhonchi  or wheezing.  On cardiovascular exam I do not detect any carotid bruits.  She has a regular rate and rhythm.  She has a palpable right femoral  pulse.  I cannot palpate a left femoral pulse.  I cannot palpate  popliteal or pedal pulses on either side.  She has mild bilateral lower  extremity swelling.  Abdomen is soft and nontender.  She is obese.  It  is difficult to assess otherwise.  She has normal-pitched bowel sounds.  Musculoskeletal exam:  She has no major deformities or cyanosis.  Neurologic exam:  She has no focal weakness or paresthesias.   I have reviewed her arteriogram, which shows a left common iliac artery  occlusion and a short-segment right superficial femoral artery  occlusion.  Of note, she has also had some epigastric abdominal pain  which is worse after meals and did undergo a CT scan, which I reviewed.  This shows bilateral adrenal nodules consistent with small adenomas and  also a uterine fibroid.  With respect to her peripheral vascular  disease, I am not convinced that all of her symptoms can be attributed  to her peripheral vascular disease as she also  experiences symptoms  simply with standing.  I have explained that we could consider the  option of a redo fem-fem bypass graft.  However, certainly she is at  high risk for infection given her obesity and given that she has already  had a previous graft infection and previous surgery.  The common iliac  artery occlusion on the left does not appear to be ideal for  endovascular approach.  However, if it is felt that her pain is not  related to her back and her symptoms progress, consideration could be  given to attempting recanalization of the left common iliac artery.  I  plan on seeing her back in 9 months.  I have encouraged her to stay as  active as possible, and she knows to call sooner if she has  problems.     Di Kindle. Edilia Bo, M.D.  Electronically Signed   CSD/MEDQ  D:  05/16/2010  T:  05/17/2010  Job:  [id number]

## 2010-12-25 NOTE — Procedures (Signed)
VASCULAR LAB EXAM   INDICATION:  Preop greater saphenous vein evaluation and mapping.   HISTORY:  Diabetes:  No.  Cardiac:  Irregular heartbeat.  Hypertension:  Yes.   EXAM:  Greater saphenous vein evaluation and mapping.   IMPRESSION:  The bilateral greater saphenous veins were evaluated and  appear patent.  However, they quickly become very small and branching  just distal to the proximal junction.   ___________________________________________  Di Kindle. Edilia Bo, M.D.   AS/MEDQ  D:  09/01/2007  T:  09/01/2007  Job:  [id number]

## 2010-12-25 NOTE — Progress Notes (Signed)
[facility]                        PERIPHERAL VASCULAR OFFICE NOTE   NAME:[name], [name]                        MRN:          [id number]  DATE:03/26/2007                            DOB:          1951/03/23    [name] was seen in followup at the [facility]  office on March 26, 2007. She is a delightful 60 year old woman with  peripheral arterial disease. I initially saw her back in February with  complaints of leg pain. At that time, I thought she had multifactorial  leg pain secondary to arterial occlusive disease as well as  osteoarthritis in her knees. I gave her an attempt of medical therapy  with Pletal. She has had a suboptimal response and continues to have  significant pain in her thighs and calves that she describes as an  aching discomfort with ambulation. Her leg pain starts with fairly low  level activity. She has really had no significant change in her  symptoms. Her left leg pain is much more severe than her right and is  the rate limiting factor for her walking. She has had no resting pain  and denies any signs of skin breakdown or ulceration. She denies chest  pain or dyspnea.   CURRENT MEDICATIONS:  1. Neurontin 300 mg 3 times daily.  2. Toprol XL 100 mg daily.  3. Norvasc 10 mg daily.  4. Vytorin 10/40 mg daily.  5. Lexapro 10 mg daily.  6. Flexeril 5 mg at bedtime.  7. Omeprazole 20 mg twice daily.  8. Pletal 100 mg twice daily.  9. Hydrochlorothiazide 25 mg daily.  10.Aspirin 81 mg daily.   ALLERGIES:  SULFA.   PHYSICAL EXAMINATION:  The patient is alert and oriented. She is in no  acute distress.  Her weight is 215 pounds. Blood pressure 136/78 in the right arm, 144/88  in the left arm, heart rate is 54.  HEENT:  Normal.  NECK:  Normal. Carotid upstrokes without bruits. Jugular venous pressure  is normal.  LUNGS:  Clear to auscultation bilaterally.  HEART:  Regular rate and rhythm without murmurs  or gallops.  ABDOMEN:  Soft, obese, nontender, no organomegaly.  EXTREMITIES:  No clubbing, cyanosis or edema. The right femoral pulse is  2+ without a bruit, the left femoral pulse is faint. The right dorsalis  pedis pulse is 2+, the posterior tibial is 1+ on the left, the dorsalis  pedis is 1+. I am unable to feel a posterior tibial pulse.  SKIN:  Warm and dry without rash. No skin breakdown.   Lower extremity arterial duplex from December 2007 showed an ABI of 0.57  on the left and 0.93 on the right. Elevated velocities in the common  femoral arteries are suggestive of significant proximal stenosis  especially on the left. There is no significant SFA disease by  ultrasound.   ASSESSMENT:  [name] has significant peripheral arterial disease with  lifestyle limiting claudication. Her symptoms are greater on the left  than the right and this corresponds with her lower ankle brachial index  on that side. Her exam is  also suggestive of proximal disease as her  left femoral pulse is very faint. I reviewed this in detail with her and  have suggested arteriography which she is in favor of. Will plan on  lower extremity arteriography with an eye towards endovascular treatment  if her anatomy is amendable to that. The risks and indications of the  procedure were reviewed in detail with the patient and she would like to  proceed. She is going to discuss things with her family and she would  like to wait until next month to undergo angiography. We will schedule  this for her to fit with her schedule.     Veverly Fells. Excell Seltzer, MD  Electronically Signed    MDC/MedQ  DD: 03/26/2007  DT: 03/27/2007  Job #: [id number]   cc:   Rollene Rotunda, MD, [facility]  Vanetta Mulders, MD  Dyke Brackett, M.D.

## 2010-12-25 NOTE — Op Note (Signed)
NAMECHIQUITTA, [name]:  [id number]   MEDICAL RECORD [name]:  [id number]          PATIENT TYPE:  INP   LOCATION:  2009                         FACILITY:  [facility]   PHYSICIAN:  Di Kindle. Edilia Bo, M.D.DATE OF BIRTH:  06-Jun-1951   DATE OF PROCEDURE:  09/02/2007  DATE OF DISCHARGE:                               OPERATIVE REPORT   PREOPERATIVE DIAGNOSIS:  Infected right-to-left fem-fem bypass graft.   POSTOPERATIVE DIAGNOSIS:  Infected right-to-left fem-fem bypass graft.   PROCEDURE:  Removal of infected fem-fem graft with vein patch  angioplasty of bilateral femoral arteries.   SURGEON:  Di Kindle. Edilia Bo, M.D.   ASSISTANT:  Jerold Coombe, P.A.   ANESTHESIA:  General.   INDICATIONS:  This is a pleasant 60 year old woman who had a chronic  left common iliac artery occlusion.  She was not felt to be a good  candidate for an endovascular approach to this.  She had been followed  by Dr. Excell Seltzer.  She was referred for possible bypass grafting.  I felt  the best option for revascularization was fem-fem bypass grafting given  her obesity and high risk of infection.  I did not think she was a good  candidate for aortofemoral bypass grafting.  She presented for elective  fem-fem bypass graft and did well postoperatively.  She returned for her  first follow-up visit in the office and was noted have a large amount of  cloudy serous drainage from the right groin with cellulitis in the right  groin consistent with a graft infection.  She was brought in for IV  antibiotics and removal of her graft.   TECHNIQUE:  The patient was taken to the operating room, received a  general anesthetic.  Both groins were prepped and draped in the usual  sterile fashion.  I did use the ultrasound scanner to be sure there was  a saphenous vein in the right thigh which appeared to be patent.  The  previous incision in the right groin was opened.  A large amount of  lymph fluid  was evacuated.  Intraoperative culture was sent.  The graft  was not incorporated.  It was doubly ligated between 2-0 silk ties and  divided.  Using a separate incision along the medial aspect of the right  leg, a segment of saphenous vein was harvested with branches divided  between 3-0 silk ties.  This segment of vein was ligated at both ends  and then excised and opened longitudinally to be used as a vein patch.  Once I controlled the artery proximal and distal to the anastomosis such  that it could be clamped, the patient was heparinized.  The common  femoral artery was clamped proximal and distal to the anastomosis, and  the entire Dacron graft was removed from the artery.  The vein patch was  then tailored and sewn end-to-side to the artery using continuous 5-0  Prolene suture.  Prior to completing the patch closure, the artery was  back bled and flushed appropriately and the anastomosis completed.  Flow  was reestablished to the right leg.  Next, attention was turned to the  left groin.  Previous incision here was opened.  The graft here was  doubly ligated with 2-0 silk tie and then divided, and the crossing limb  was removed.  Again, through scar tissue, I was able to control the  superficial femoral artery and common femoral artery.  The deep femoral  artery was looped with a blue vessel loop for control.  The vessels were  controlled, and then the entire graft removed from the left femoral  artery.  This hole was then closed with a vein patch angioplasty using  the remaining vein.  This was sewn with continuous 5-0 Prolene suture.  Prior to completing this closure, the artery was back bled and flushed  appropriately and then the anastomosis completed.  There was good  Doppler flow in the superficial femoral artery and deep femoral artery  at the completion.  At completion, there was a faint anterior tibial and  dorsalis pedis signal with the Doppler.  A 19 Blake drain was placed  in  each groin.  The vein harvest site was closed with a deep layer of 2-0  Vicryl, subcutaneous layer with 2-0 Vicryl, and the skin closed with 4-0  subcuticular stitch.  The left groin was closed with a deep layer of 2-0  Vicryl, subcutaneous layer of 3-0 Vicryl, and the skin closed with 4-0  subcuticular stitch.  The right groin was closed with a deep layer of 2-  0 Vicryl, and the skin was closed with interrupted 3-0 nylons.  Sterile  dressing was applied.  The patient tolerated the procedure well and was  transferred to the recovery room in satisfactory condition.  All needle  and sponge counts were correct.      Di Kindle. Edilia Bo, M.D.  Electronically Signed     CSD/MEDQ  D:  09/02/2007  T:  09/02/2007  Job:  [id number]

## 2010-12-25 NOTE — Progress Notes (Signed)
[facility]                        PERIPHERAL VASCULAR OFFICE NOTE   NAME:[name], [name]                        MRN:          [id number]  DATE:07/22/2007                            DOB:          1950/09/02    [name] was seen in followup at the [facility] office on  July 22, 2007.  [name] is a 60 year old woman with lower  extremity peripheral arterial disease.  I have followed her over the  course of the year for intermittent claudication.  She has both typical  and atypical symptoms of claudication.  Her main exertional symptom is  left thigh pain.  She also has knee pain that is likely related to  osteoarthritis.  She is extremely limited by the pain in her left thigh  with walking and is only able to walk very short distances.  She does  not have rest pain.  She denies calf claudication.   I performed an arteriogram in September 2008 that demonstrated total  occlusion of the left common iliac artery that extends from the origin  of the vessel to the distal vessel just above the bifurcation of the  internal and external iliac.  She did not have any significant  downstream disease, and had three-vessel runoff to the foot.  On the  right side, there were no significant stenoses throughout.  At that  time, I elected to continue with conservative therapy, as I did not  think the occlusion was favorable for a PTA in the setting of a flush  occlusion with inability to visualize the proximal stump of the iliac.   [name] has persistent symptoms at this point and strongly desires  revascularization.   CURRENT MEDICATIONS:  1. Neurontin 300 mg 2 t.i.d.  2. Toprol-XL 100 mg daily.  3. Norvasc 10 mg daily.  4. Vytorin 10/40 mg daily.  5. Flexeril 5 mg at bedtime.  6. Omeprazole 20 mg twice daily.  7. Pletal 100 mg daily.  8. Hydrochlorothiazide 25 mg daily.  9. Aspirin 81 mg daily.  10.Celexa 10 mg daily.   ALLERGIES:   SULFA.   PHYSICAL EXAMINATION:  GENERAL:  The patient is alert and oriented.  She  is in no acute distress.  She is an obese woman.  VITAL SIGNS:  Her weight is 221 pounds, blood pressure is 160/98  bilaterally, heart rate 72, respiratory rate 16.  HEENT:  Normal.  NECK:  Normal carotid upstrokes, without bruits.  Jugular venous  pressure is normal.  LUNGS:  Clear to auscultation bilaterally.  HEART:  Regular rate and rhythm, without murmurs or gallops.  ABDOMEN:  Soft, obese, nontender, no bruits.  EXTREMITIES:  Right femoral pulses 2+, without a bruit.  Left femoral  pulses is not palpable.  The right PT and DP pulses are 2+.  On the  left, I am unable to feel pedal pulses.  ABI in the office was 0.98 on  the right, and 0.54 on the left.   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  I had a long discussion with her regarding  revascularization options.  My recommendation was to make an attempt at  endovascular treatment.  While the occlusion is not ideal for  revascularization, I think it would be possible to approach this from  retrograde access through the left femoral artery.  I quoted her a 50%  chance of successful PTA with stenting.  This would depend on the  ability to cross the lesion and reenter the aorta.  The other option  would be for fem-fem bypass.  I think this would be reasonable, but in  the setting of her obesity, it may not be an ideal first-line approach.  She strongly desires definitive therapy and does not want to undergo an  attempt at endovascular treatment at this point.  Therefore, I have  referred Ms [name] to [facility] for a vascular surgery evaluation and  recommendation regarding her best revascularization option and if  surgery is appropriate, that would certainly be reasonable from my  standpoint.  I will base [name]' followup on the results of  her  vascular surgery evaluation.     [name] Excell Seltzer, MD  Electronically Signed    MDC/MedQ  DD: 07/23/2007  DT: 07/24/2007  Job #: [id number]   cc:   Balinda Quails, M.D.  Vanetta Mulders, MD  Dyke Brackett, M.D.

## 2010-12-28 NOTE — Discharge Summary (Signed)
[name], [name] NO.:  [id number]   MEDICAL RECORD NO.:  [id number]                   PATIENT TYPE:  INP   LOCATION:  3705                                 FACILITY:  [facility]   PHYSICIAN:  Salvadore Farber, M.D. [facility]         DATE OF BIRTH:  July 22, 1951   DATE OF ADMISSION:  05/21/2002  DATE OF DISCHARGE:  05/22/2002                           DISCHARGE SUMMARY - REFERRING   HISTORY OF PRESENT ILLNESS:  The patient is a 60 year old black female who  was seen in our office on May 21, 2002.  She presented with substernal  chest discomfort associated with shortness of breath, questionable relief  with sublingual nitroglycerin.  She has a history of GERD, costochondritis,  hypertension, hyperlipidemia, obesity.  It was also noted she has a history  of H. pylori that has not been treated.  Prior Cardiolite showed an EF of  52%.  No ischemia.   LABORATORY DATA:  Admission sodium was 138, potassium 3.4, BUN 8, creatinine  0.7, glucose 91.  Normal LFTs.  PT 13.4, PTT 42.  H&H 12.5 and 38.3, normal  indices, platelets 267, WBC 7.2.  CK and troponin were negative for  myocardial infarction.   EKG showed normal sinus rhythm.  Left axis deviation.  Early R-wave, PACs.  Nonspecific ST and T-wave changes.   HOSPITAL COURSE:  The patient was admitted to [facility].  She underwent cardiac catheterization by Dr. Antoine Poche on May 21, 2002.  According to his notes, she had an ostial 25% left main, 25% proximal LAD,  25% mid LAD, ostial calcification of the circumflex, luminal irregularities  of the RCA.  EF was 65%.  Dr. Antoine Poche felt that she did not have any  obstructive coronary artery disease, and she had normal LV function.  He  felt that her discomfort was not related to her chest discomfort, and she  did not need any further cardiac workup.  Post sheath removal and bed rest,  she did have some bleeding from the catheterization site,  and pressure was  reinstituted.  Post bed rest overnight she has done well.  She is  ambulating, catheterization site intact.  On May 22, 2002, reviewed.  The H&H were 11.1 and 34.0, normal indices, platelets 242, WBC 5.5.  PTT 33.  Sodium 139, potassium 3.4, BUN 6, creatinine 0.6, glucose 112.  ABG on room  air showed a pH of 7.41, pCO2 of 36.9, pO2 of 85.5.  The d-dimer was within  normal limits.  After Foley was removed she was ambulating without  difficulty, and she was discharged home.   DISCHARGE DIAGNOSIS:  Noncardiac chest discomfort.  History as previously  described.   DISPOSITION:  She was discharged home.   MEDICATIONS:  She was asked to continue her Vasotec 10 mg b.i.d., Toprol XL  50 mg q.d., atenolol 50 mg 1/2 tablet q.d.  She was to ask her  primary care  about taking two beta blockers.  We also asked her to begin taking a coated  baby aspirin 81 mg q.d.   DISCHARGE INSTRUCTIONS:  She was advised no lifting, driving, sexual  activity, or heavy exertion for two days.  Maintain low-salt, fat,  cholesterol diet.  If she has any problems with her catheterization site she  was asked to  call immediately.  She will call our office on Monday to arrange a one-week  appointment with the P.A. to check her catheterization site.  She will also  follow up with Dr. Emeline Darling in regards to cardiac risk factor modification and a  questionable history about untreated H. pylori.     [name], P.A. [facility]                    Salvadore Farber, M.D. [facility]    EW/MEDQ  D:  05/22/2002  T:  05/24/2002  Job:  [id number]   cc:   Willa Rough, MD [facility]   [facility]

## 2010-12-28 NOTE — Consult Note (Signed)
[place]. [facility]  Patient:    [name], [name] Visit Number: [id number] MRN: [id number]          Service Type: MED Location: [phone] Attending Physician:  Edwyna Perfect Dictated by:   Luis Abed, M.D. [facility] Admit Date:  12/29/2001 Discharge Date: 01/01/2002   CC:         [facility]  [place] [facility]   Consultation Report  REASON FOR CONSULTATION:  We are asked to see this 60 year old patient for the evaluation of chest pain. The patient has had pain over three days, both at rest and with exertion. Along with this, there is a sensation of numbness in her lip and some tingling in her left fingers. She has had some nausea with it. She has had a sensation of warmth and does not know if this is related to menopause or related to this current illness. She feels as if there is something heavy on her chest. This symptom persisted and she was seen at [facility]. EKG was done and there was concern about her cardiac status and she was brought to [facility]. 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 It was positive for H. pylori, but I now understand that the patient did not return for treatment of the H. pylori. There are risk factors for coronary disease including hypertension and a family history. The patient is overweight. We are now seeing her for further cardiac evaluation.  PAST MEDICAL HISTORY:  ALLERGIES:  SULFA.  MEDICATIONS:  Zantac, currently Lovenox in the hospital, Protonix IV in the hospital, aspirin 81 mg, and Norvasc 5 mg. At home the patient had been on atenolol but she could not remember the name of the drug.  For other medical problems, see the complete list below.  SOCIAL HISTORY:  The patient lives in [place]. She is married. She is on disability from [employer]. She smoked for 30 years and quit three years ago.  FAMILY  HISTORY:  Her mother is alive at age 55 and father died of an unknown cause, but she does have siblings with coronary disease.  REVIEW OF SYSTEMS:  The patient has not had any definite fevers or chills. She has no major HEENT problems. There are no obvious skin rashes. As mentioned above, she is having this chest heaviness. She states that she is going through menopause. There has been no major GU symptoms. She has some discomfort in her left shoulder historically and her lumbar area also. GI: At this point she mentioned some reflux-type symptoms. She has not had any skin problems. The remainder of her review of systems is negative.  PHYSICAL EXAMINATION:  VITAL SIGNS:  Temperature, she is afebrile. Blood pressure is 125/85, pulse 83, respirations 20.  GENERAL:  She is stable in bed. She has a somewhat flat affect.  HEENT:  No significant abnormalities.  NECK:  Supple. There are no obvious bruits. There is no obvious lymphadenopathy.  CARDIAC:  S1 with an S2. There is mild irregularity in the rhythm and this is probably due to the PACs seen on her EKG. The patient does have tenderness to palpation of the anterior chest.  LUNGS:  No obvious rales.  SKIN:  No rashes.  ABDOMEN:  Soft.  RECTAL AND GENITOURINARY:  Not done at this time.  EXTREMITIES:  No obvious edema. There is a left shoulder mild deformity.  LABORATORY AND ACCESSORY DATA:  EKG reveals T wave changes compatible with left ventricular hypertrophy. It is of note that this EKG is unchanged from the previous tracing. Chest x-ray revealed no active disease. Her 2-D echo done this admission shows good left ventricular function with no focal wall motion abnormalities and an ejection fraction in the 55-60% range. There was mild left ventricular hypertrophy. There was mild increase in the aortic valve thickness but this was not a significant finding.  So far her D-dimer has been negative and her troponins are negative  x3. Renal function is normal. We do know that her LDL is 154.  PROBLEMS INCLUDE: 1. History of hypertension. 2. History of knee and shoulder surgery. 3. 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 4. History of chronic bronchitis and asthma. 5. Gastroesophageal reflux disease. 6. 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 7. History of SULFA ALLERGY. 8. Hyperlipidemia. 9. Ongoing chest heaviness.* At this point, it is not clear if this pain is cardiac. There has been no acute EKG change and no diagnostic changes in her cardiac enzymes. I do not feel that the patient is unstable at this time. It is possible that her symptoms are related to her gastroesophageal reflux disease or to chest wall tenderness. However, cardiac should be ruled out. We have started some IV nitroglycerin, but now that our consult is complete we will probably wean that off over the evening. We will schedule the patient for an adenosine Cardiolite tomorrow. If she were to have significant ischemia, more aggressive workup would be indicated. Otherwise, no further cardiac workup would be needed. Dictated by:   Luis Abed, M.D. [facility] Attending Physician:  Edwyna Perfect DD:  12/30/01 TD:  01/01/02 Job: [id number]

## 2010-12-28 NOTE — Discharge Summary (Signed)
[name], [name] NO.:  [id number]   MEDICAL RECORD NO.:  [id number]          PATIENT TYPE:  INP   LOCATION:  5524                         FACILITY:  [facility]   PHYSICIAN:  Arvilla Meres, M.D. LHCDATE OF BIRTH:  April 29, 1951   DATE OF ADMISSION:  03/19/2005  DATE OF DISCHARGE:  03/20/2005                                 DISCHARGE SUMMARY   SUMMARY OF HISTORY:  [name] is a 60 year old African-American female who  is referred to the emergency room from [facility] secondary to intermittent  chest discomfort since the preceding Friday, relieved with sublingual  nitroglycerin.  She describes the discomfort as heaviness associated with  nausea and pain in the neck and back.  Her discomfort was a 4 on a scale of  0 to 10.  She also describes pain in her abdomen and the lower part of her  back similar to her admission in October 2003.   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  Catheterization in October 2003  showed an EF 65%, nonobstructive coronary artery disease with 25% lesions in  the proximal and mid-LAD.  Remote tobacco use.   LABORATORY DATA:  EKG from the emergency room showed normal sinus rhythm,  left axis deviation, early R-waves, T-wave inversion V3 through V6 as well  as II, III and aVF.  Chest x-ray did not show any active disease.  Admission  H&H was 13.9 and 41.0, normal indices, platelets 257, WBC 6.5.  PTT 28, PT  12.8.  Sodium 139, potassium 3.7, BUN 10, creatinine 0.7.  Normal LFTs,  glucose 92.  CK-MBs and troponins were negative x3.  Fasting lipids showed a  total cholesterol of 233, triglycerides 143, HDL 43, LDL 161.  TSH was  1.201.  Urinalysis was unremarkable.  Amylase and lipase were 75 and 35,  respectively.   HOSPITAL COURSE:  [name] was admitted to [facility].  She was  continued on her medication list from [facility] based on medication list  of  August 16, 2004.  Overnight she ruled out for myocardial infarction, and  she did not have any further chest discomfort.  Adenosine Myoview was  performed and showed an EF of 57% with anterior thinning versus scar;  however, she did not have any demonstratable ischemia or wall motion  abnormalities.  After review, Dr. Gala Romney felt that [name] could be  discharged home on her home medications and asked to follow up with her  primary care physician.  It was felt that she could be discharged home.   DISCHARGE DIAGNOSES:  1.  Atypical chest discomfort with negative adenosine Cardiolite.  2.  Hyperlipidemia.  3.  Hypertension.  4.  History as previously.   DISPOSITION:  The patient is discharged home, reminded to maintain a low  salt, fat and cholesterol diet.  Her activities are not restricted.  She was  asked to call [facility] to arrange a follow-up two-week appointment for a  possible referral for a GI evaluation.  She was asked to bring all  medications to all her appointments.  She was asked to continue her home  medications.  It is difficult to discern exactly what she was taking at  home, as that they are not correctly listed in the history and physical or  the nursing history and physical.  I believe they include:   1.  Zocor 20 mg q.h.s.  2.  Norvasc 10 mg daily.  3.  Toprol XL 50 mg daily.  4.  Omeprazole 20 mg daily.   I asked the patient to review these medications when she goes home and if  she finds any difference, I have asked her to call me directly.      [name], P.A. [facility]      Arvilla Meres, M.D. [facility]  Electronically Signed    EW/MEDQ  D:  03/20/2005  T:  03/20/2005  Job:  [id number]   cc:   Dala Dock

## 2010-12-28 NOTE — Op Note (Signed)
NAME:  [name], [name]                           ACCOUNT NO.:  [id number]   MEDICAL RECORD NO.:  [id number]                   PATIENT TYPE:  AMB   LOCATION:  DAY                                  FACILITY:  [facility]   PHYSICIAN:  Lorre Munroe., M.D.            DATE OF BIRTH:  1950-08-29   DATE OF PROCEDURE:  06/21/2003  DATE OF DISCHARGE:                                 OPERATIVE REPORT   PREOPERATIVE DIAGNOSIS:  Symptomatic gallstones.   POSTOPERATIVE DIAGNOSIS:  Symptomatic gallstones.   OPERATION:  Laparoscopic cholecystectomy.   SURGEON:  Lebron Conners, M.D.   ASSISTANT:  Gita Kudo, M.D.   ANESTHESIA:  General.   DESCRIPTION OF PROCEDURE:  After the patient was monitored and anesthetized  and had routine preparation and draping of the abdomen, I liberally infused  local anesthetic just below the umbilicus and subsequently at three  additional port sites.  I then made a transverse incision just below the  umbilicus, dissected down to the fascia, opened it in the midline, and  bluntly opened the peritoneum.  After putting a 0 Vicryl pursestring suture  in the fascia and securing a Hasson cannula, I inflated the abdomen with CO2  and examined the contents.  There were some filmy adhesions on the dome of  the liver to the diaphragm and anterior abdominal wall, and the gallbladder  appeared moderately inflamed.  No other abnormalities were seen.  After  placement of three additional ports and taking down of the adhesions over  the dome of the liver, I retracted the fundus of the gallbladder toward the  right shoulder and took down the adhesions to its undersurface.  The  gallbladder was completely filled with hard gallstones and was contracted.  It made dissection a little bit difficult, but we were able to grasp it and  pull it out laterally and create a nice window between the gallbladder and  the liver, and I saw the cystic artery traversing the window and clipped  and  divided it.  I then dissected further until I clearly saw the infundibulum  of the gallbladder and the cystic duct emerging from it.  The cystic duct  was very small.  I clipped the cystic duct with four clips and cut between  the two closest to the gallbladder.  I then dissected the gallbladder from  the liver, utilizing the hook and spatula cautery instruments, and I found  one other small artery and clipped that as well.  Hemostasis was excellent  as well.  I accidentally made one small hole in the gallbladder and a couple  of stones came out, and I removed those.  After detaching the gallbladder  from the liver, I placed it in a plastic pouch and put it above the liver.  I then got good hemostasis in the gallbladder  fossa and copiously irrigated  the fossa and removed most of the  irrigant.  I then removed the gallbladder  in its pouch from the body through the umbilical incision and tied the  pursestring suture.  I removed the remaining irrigant.  Sponge, needle, and  instrument counts were correct.  I removed the lateral ports under direct  vision and saw no bleeding from the abdominal wall.  After allowing the CO2  to escape, I removed the epigastric port.  I closed all skin incisions with  intracuticular 4-0 Vicryl and Steri-Strips.  The patient tolerated the  operation well.                                              Lorre Munroe., M.D.   WB/MEDQ  D:  06/21/2003  T:  06/21/2003  Job:  [id number]

## 2010-12-28 NOTE — Consult Note (Signed)
[place]. [facility]  Patient:    [name], [name] Visit Number: [id number] MRN: [id number]          Service Type: MED Location: [phone] Attending Physician:  Edwyna Perfect Dictated by:   Griffith Citron, M.D. Proc. Date: 12/31/01 Admit Date:  12/29/2001 Discharge Date: 01/01/2002                            Consultation Report  REASON FOR CONSULTATION: To evaluate this 60 year old African-American female with complaints of chest pain.  HISTORY OF PRESENT ILLNESS: The patient noted the onset of chest pain approximately one week ago with three days of intense symptoms following exertion. Because of persistent symptoms, she was seen at [facility] two days ago. She was admitted directly with concern for possible MI. This has been ruled out. Her pain has gradually subsided and has decreased from eight to one on a scale of ten. She describes a substernal pressure type of discomfort, radiating to the left jaw and left upper extremity. There is associated numbness and tingling of the lips and tongue. No syncope. No nausea or vomiting. The patient does have intermittent pyrosus which is different from her current symptoms. No odynophagia or dysphagia. Symptoms have been unrelated to food. Appetite remains good. Weight is stable.  PAST MEDICAL HISTORY: Antral erosion/ulcer in October 2001 evaluated when she presented at that time with similar symptoms. Found to be Helicobacter positive. Treatment at that time was uncertain. 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 She also has hypertension.  CURRENT RISK FACTORS: Include the use of Ibuprofen two to four tabs per day for bilateral knee pain. No aspirin or other NSAID use. Stopped smoking three years ago. Denies alcohol consumption.  REVIEW OF SYSTEMS: No odynophagia or  dysphagia. Denies regurgitation. No dyspepsia. Bowel habits are regular. No hematochezia or melena. Appetite remains good. Weight is stable.  FAMILY HISTORY: Mother with hypertension and diabetes age 47. Sister deceased at age 76 from coronary artery disease. Brother with heart disease. Fathers history unknown. She is married with three children. 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  SOCIAL HISTORY: No tobacco. Had a two pack per day history for 29 years. Discontinued this three years ago. No IV drug use. She has a ninth grade education.  CURRENT MEDICATIONS: Norvasc, Ibuprofen, Zantac 75 mg daily.  ALLERGIES: SULFA.  REVIEW OF SYSTEMS: Generalized weakness. Headache frequently. Occasional shortness of breath.  PHYSICAL EXAMINATION:  GENERAL: Healthy appearing middle aged African-American female appearing older than stated age. Alert and oriented. Normal mood.  VITAL SIGNS: Stable. Afebrile.  HEENT: Nonicteric sclera. Pink conjunctiva. No pallor. Mouth, no oropharyngeal lesions. Without lesions of lip, gums, or tongue.  NECK: Supple. No adenopathy or thyromegaly.  CHEST: Clear to auscultation without adventitious sounds.  BREAST: Not examined.  CARDIAC: Regular rhythm. No gallop or murmur.  ABDOMEN: Obese with active bowel sounds. Soft, nontender, and nondistended. No palpable organomegaly, mass, or firmness. Bowel sounds active without bruit or splash.  RECTAL: Not performed.  EXTREMITIES: Without clubbing, cyanosis, or edema.  LABORATORY DATA: Normal.  ASSESSMENT: 1. Chest pain, atypical, with GI component. Although this could represent    reflux disease, there is little to suggest this. 2. NSAID use, possible gastropathy. The patient does have a prior history  of    antral erosion. She has been using Ibuprofen on a regular basis for the    past several months. Even if this were present, I doubt that is the    explanation  for the patients chest pain. 3. History of Helicobacter pylori infection. Uncertain if this has been    treated in the past. 4. History of antral ulcer (questionable erosion).  RECOMMENDATIONS: 1. Protonix 40 mg p.o. q.a.m. for six to eight weeks. 2. Avoid all aspirin and NSAID use indefinitely. 3. Endoscopy if symptoms do not respond to PPI therapy or if they recur    after an eight week course of treatment. 4. Treat Helicobacter pylori if not previously done. Dictated by:   Griffith Citron, M.D. Attending Physician:  Edwyna Perfect DD:  12/31/01 TD:  01/04/02 Job: [id number]

## 2010-12-28 NOTE — Discharge Summary (Signed)
NAMEORIEL, OJO NO.:  [id number]   MEDICAL RECORD NO.:  [id number]          PATIENT TYPE:  INP   LOCATION:  5524                         FACILITY:  [facility]   PHYSICIAN:  Arvilla Meres, M.D. LHCDATE OF BIRTH:  20-May-1951   DATE OF ADMISSION:  03/19/2005  DATE OF DISCHARGE:                           DISCHARGE SUMMARY - REFERRING   ADDENDUM:  At time of followup at [facility], consideration should be  given to increasing her Zocor given her hyperlipidemia for a goal HDL  greater than 50 and LDL less than 70.      Joellyn Rued, P.A. [facility]      Arvilla Meres, M.D. [facility]  Electronically Signed    EW/MEDQ  D:  03/20/2005  T:  03/20/2005  Job:  [id number]

## 2010-12-28 NOTE — Discharge Summary (Signed)
[place]. [facility]  Patient:    [name], [name] Visit Number: [id number] MRN: [id number]          Service Type: MED Location: [phone] Attending Physician:  Edwyna Perfect Dictated by:   Ladell Pier, M.D. Admit Date:  12/29/2001 Discharge Date: 01/01/2002   CC:         [facility]   Discharge Summary  DISCHARGE DIAGNOSES: 1. Chest pain.  A two-dimensional echocardiogram was done that showed left    ventricular systolic function normal with ejection fraction 55-65%.  No    left ventricular regional wall motion abnormalities.  Left ventricular wall    thickness was mildly increased.  Aortic valve thickness was mildly    increased. There was mild to moderate mitral regurgitation.  Cardiolite    stress test done showed no ischemia.  Ejection fraction of 52%. 2. Reflux disease.  The patient had a history of reflux disease.    Esophagogastroduodenoscopy in August 2001, showed gastroesophageal reflux    disease with peptic stricture, erosive antral mucosal disease, positive    campylobacter-like organism (CLO) test was not treated during that    hospitalization.  She was suppose to follow up and was lost to follow up.    She was not treated and was sent home on triple drug regimen.    Gastroenterology consult recommended treatment of Helicobacter pylori and    proton-pump inhibitor for six to eight weeks. 3. Degenerative joint disease of patella, status post knee surgery x2 with    left shoulder arthritis with surgery x2, lower back pain, less than    chronic, on Disability secondary to degenerative joint disease. 4. Hypertension.  DISCHARGE MEDICATIONS: 1. Atenolol and Enalapril with close followup for blood pressure monitor. 2. Zocor 40 mg q.d. 3. Amoxicillin 500 mg two tablets p.o. q.d. x11 days. 4. Biaxin 500 mg twice daily x11 days. 5. Prilosec 20 mg twice daily x8 weeks.  CONSULTATIONS: 1. Cardiology. 2. Gastroenterology.  FOLLOWUP:   The patient was told to call [facility] and make appointment for followup.  SPECIAL INSTRUCTIONS:  The patient was told to avoid NSAIDs and aspirin.  CHIEF COMPLAINT:  Chest pain x3 days.  HISTORY OF PRESENT ILLNESS:  The patient is a 60 year old, African-American female with past medical history significant for hypertension, GERD and DJD. The patient stated that she was cleaning at home when she developed chest pain in the left side of her chest that radiated up her left jaw and down her left arm with pain and numbness in her fingers.  She got diaphoretic and short of breath with each episode.  She was also nauseated.  The pain was 8/10.  She stated her other pain felt like a heavy feeling in her chest.  She took ibuprofen with minimal relief.  The pain has been persistent for the past three days and progressively getting worse.  It does not feel like heartburn. She had never had this pain before.  The patient went to [facility] and got one sublingual nitroglycerin without relief, then two sublingual nitroglycerin without relief.  The pain is not associated with food and does not get worse with palpitation.  The patient stated she also had numbness in lips and fingers starting this a.m., positive for lightheadedness and negative for dysphagia, positive for blurred vision, negative for dysarthria, positive for headaches x3 days.  PAST MEDICAL HISTORY:  As in history of present illness.  FAMILY HISTORY:  Fathers history unknown.  Mother is 74 years  old with hypertension and diabetes.  Sister died at 8 secondary to coronary artery disease.  Brother with heart disease.  SOCIAL HISTORY:  He use to smoke to packs per day for the past 29 years and quit three years ago.  No alcohol or IV drug use.  He is married with three children that are all grown.  He is on Disability at present secondary to DJD. He left school in the 9th grade.  MEDICATIONS: 1. Atenolol. 2. Enalapril. 3.  Ibuprofen for joint pain. 4. Zantac 75 mg q.d.  ALLERGY:  SULFA.  REVIEW OF SYSTEMS:  Positive for weakness, headaches x3 days, nausea, shortness of breath, hypertension, indigestion, numbness and tingling in lips, fingers and hands.  Heat intolerance.  PHYSICAL EXAMINATION:  VITAL SIGNS:  Temperature 98.1, blood pressure 152/94, pulse 76, respiratory rate 24, pulse oximetry 100% on room air.  GENERAL:  The patient was in no apparent distress sitting up in bed.  HEENT:  Head normocephalic, atraumatic.  Pupils are equal, round and reactive to light.  Throat with no erythema.  CARDIAC:  Regularly irregularly.  S1, S2 with no murmurs, rubs or gallops. Chest wall tender to palpation.  No JVD or bruit.  LUNGS:  Clear to auscultation bilaterally.  ABDOMEN:  Soft, obese, positive bowel sounds.  EXTREMITIES:  No edema.  Knee tender to palpation.  LYMPHATICS:  No lymphadenopathy.  NEUROLOGIC:  Cranial nerves II-XII intact.  No focal deficits.  HOSPITAL COURSE:  #1 - CHEST PAIN:  Differential diagnosis for her chest pain was gastrointestinal in etiology.  She does have a history of esophagitis with reflux disease.  She had H. pylori documented from the CLOtest and did not follow up for treatment.  It was thought that it could be GI.  She was given a GI cocktail without any relief of her pain, but because she does have risk factors in that she has dyslipidemia, she is a smoker with uncontrolled hypertension and a positive family history, cardiology was consulted.  She did have an abnormal EKG also with multiple PVCs, PACs and some T wave inversions in II, III, aVF and the anterior chest leads, but had not changed from 2001. Cardiology was consulted and did a two-dimensional echocardiogram that was normal.  Cardiolite stress test was normal.  She ruled out by enzymes.  GI was  then consulted and recommended treating her H. pylori and sending her out on the PPI for six to eight weeks.   A D-dimer was also done to rule out PE which was normal.  There was no evidence of infection with pneumonia ruled out. There was no evidence of pericarditis.  Her chest pain resolved during her hospitalization and she was sent home on triple drug regimen for her H. pylori.  #2 - HYPERTENSION:  She was treated with Lopressor during her hospitalization. She was also on a nitroglycerin drip to control her chest pain.  #3 - DEGENERATIVE JOINT DISEASE:  She was treated with p.r.n. Tylenol.  #4 - GASTROESOPHAGEAL REFLUX DISEASE:  She had a H. pylori infection and was started at discharge on amoxicillin, Biaxin and Prilosec which she was treated with during her inpatient stay.  DISCHARGE LABORATORY DATA AND X-RAY FINDINGS:  Sodium 140, potassium 4.1, chloride 107, carbon dioxide 28, glucose 105, BUN 13, creatinine 0.8, calcium 9.1.  CBC with WBC 6.2, hemoglobin 11.5, MCV 86.1, platelets 227.  C-reactive protein 9.77.  D-dimer 0.22.  Sodium 140, potassium 3.7, chloride 106, CO2 26, glucose 91, BUN 9,  creatinine 0.8, calcium 9.8.  Total protein 7.3, albumin 3.8, AST 20, ALT 23, Alk phos 75, total bilirubin 0.7, magnesium 2.0.  Cardiac enzymes all negative.  Lipid profile with total cholesterol 228, triglycerides 131, HDL 48, LDL 154, TSH 0.671.  On admission, her hemoglobin was 13.4, hematocrit 33.7, platelets 250. Dictated by:   Ladell Pier, M.D. Attending Physician:  Edwyna Perfect DD:  01/01/02 TD:  01/05/02 Job: [id number]

## 2010-12-28 NOTE — Op Note (Signed)
[place]. [facility]  Patient:    [name], [name]                         MRN: [id number] Proc. Date: 03/12/00 Adm. Date:  [id number] Disc. Date: [id number] Attending:  Milly Jakob CC:         Harvie Junior, M.D.   Operative Report  PREOPERATIVE DIAGNOSIS:  Degenerative joint disease of patella.  POSTOPERATIVE DIAGNOSES: 1. Degenerative joint disease of the patella. 2. Tight lateral retinaculum. 3. Anterolateral meniscal tear.  PROCEDURE: 1. Partial anterolateral medial meniscectomy. 2. Debridement of chondromalacia patella. 3. Lateral retinacular release.  SURGEON:  Dr. Luiz Blare.  ANESTHESIA:  General.  HISTORY OF PRESENT ILLNESS:  The patient is 60 year old female with a long history of having severe bilateral knee pain.  She was evaluated in the office and an injection gave her some five hours of relief, but then ultimately pain recurred.  She is frustrated because of the severe pain she was having, and felt that she needed to have something done.  In the office she was noted to have severe positive compression in remission, and it was felt that she had degenerative joint disease in the patellofemoral joint.  We talked to her about therapy and other modalities, but she had been through these before and felt that she wanted to get this addressed, and she is brought to the operating room for this procedure.  DESCRIPTION OF PROCEDURE:  The patient is taken to the operating room.  After adequate anesthesia was obtained the patient was placed supine on the operating room table.  The right leg was then prepped and draped in the usual sterile fashion.  Following this, routine arthroscopic examination revealed there was obvious grade four change on the posterior surface of the patella. This was debrided minimally, and attention was turned to the medial side. Weightbearing area with some grade two changes on the medial tibial plateau, but the medial  femoral condyle looked good, the medial meniscus was normal. Attention was then turned laterally where the anterior cruciate ligament was noted to be normal, however, the fibers were slightly unbundled.  They were functioning fine laterally.  In the anterolateral aspect there was an anterolateral meniscal tear which was blocking access into the lateral compartment.  This was debrided with a suction shaver down to a stable rim. Following this, attention was turned back into the patellofemoral joint where the under surface of the patella was debrided with a suction shaver, and the tight lateral retinaculum was then identified, and this was released arthroscopically.  The patella then tracked much better.  The plicas were excised both medially and laterally to help allow for better patella tracking, and the under surface of the patella was finalized.  One final check was made to make sure there were no loose fragments or pieces throughout the knee.  It was then copiously irrigated and suctioned dry.  The portals were closed with Steri-Strips, a sterile compressive dressing was applied.  She was taken to the recovery room and noted to be in satisfactory condition.  ESTIMATED BLOOD LOSS:   None. DD:  03/12/00 TD:  03/13/00 Job: [id number]

## 2010-12-28 NOTE — Procedures (Signed)
[place]. [facility]  Patient:    [name], [name]                          MRN: [id number] Proc. Date: 05/19/00 Adm. Date:  [id number] Disc. Date: [id number] Attending:  Phifer, Trinna Post CC:         Everardo Beals. Juanda Chance, M.D. [facility]  Alvester Morin, M.D.   Procedure Report  PROCEDURE PERFORMED:  Esophagogastroduodenoscopy with biopsies.  ENDOSCOPIST:  [name]. Eda Keys., M.D. [facility]  INDICATIONS FOR PROCEDURE:  Chest pain and reflux disease.  HISTORY:  The patient is a 61 year old black female who was admitted to the hospital yesterday with a three-day history of chest pain.  She was felt to have noncardiac origin.  She was seen in consultation by Dr. Lina Sar. Pain was felt to be musculoskeletal.  The patient does have a long history of reflux symptoms.  She is now for upper endoscopy. The nature of the procedure as well as the risks, benefits and alternatives were reviewed.  She understood and agreed to proceed.  PHYSICAL EXAMINATION:  The patient is a well-appearing female in no acute distress.  She is alert and oriented.  Vital signs are stable.  Lungs are clear.  Heart is regular.  Left superior chest wall was tender to palpation. Abdomen is benign.  DESCRIPTION OF PROCEDURE:  After informed consent was obtained, the patient was sedated with 50 mg of Demerol and 6 mg of Versed IV.  The Olympus endoscope was passed orally under direct vision into the esophagus.  The distal esophagus revealed a benign large caliber fibrous stricture.  No associated esophagitis.  The stomach revealed a sliding hiatal hernia.  In addition, multiple antral erosions as well as one superficial ulcer also located in the gastric antrum.  Biopsies for CLO testing were obtained.  The duodenal bulb and postbulbar duodenum were normal.  IMPRESSION: 1. Gastroesophageal reflux disease with peptic stricture. 2. Erosive antral mucosal disease.  Rule out Helicobacter pylori versus  nonsteroidal anti-inflammatory drug induced lesion.  RECOMMENDATIONS: 1. Follow up CLO biopsy and treat if positive. 2. Continue Protonix 40 mg p.o. q.d. 3. Reflux precautions. 4. GI follow-up p.r.n. DD:  05/19/00 TD:  05/20/00 Job: [id number]

## 2010-12-28 NOTE — Assessment & Plan Note (Signed)
[facility]                            CARDIOLOGY OFFICE NOTE   NAME:[name], [name]                        MRN:          [id number]  DATE:07/08/2006                            DOB:          [birth year]    REASON FOR REFERRAL:  Patient was chest pain.   HISTORY OF PRESENT ILLNESS:  The patient is a pleasant, 60 year old,  African American female with prior workup in 2004 with a cardiac  catheterization demonstrating 25% right coronary artery lesion.  She  also had a stress perfusion study in 2006 demonstrating no ischemia or  infarct.  She is now referred back to [place] for evaluation of chest  discomfort.  This may be similar to previous discomfort.  It started  again 2 months ago.  It occurs at rest.  She also notices it if she  walks.  She has to walk with a cane.  She will get some discomfort in  her upper chest.  She describes it as sharp and 5/10.  She does not  describe it as radiating to her jaw or to her arms.  She does not have  any nausea with this.  She does have dyspnea, but this has been a  chronic problem.  She does not describe PND or orthopnea.  She does not  describe any diaphoresis.  The discomfort may last for 5 to 10 minutes.  It can go away spontaneously.  She has also been given sublingual  nitroglycerine, and says this helps.  She has only taken a couple of  these per week, as she does not like the taste or effects.  She does  have the symptoms spontaneously as well.  She does not describe any  palpitations, presyncope or syncope.   PAST MEDICAL HISTORY:  1Hypertension x3 years, hyperlipidemia x2 years,  nonobstructive coronary disease as described.   PAST SURGICAL HISTORY:  Arthroscopic knee surgery, repair of rotator  cuff.   ALLERGIES:  SULFA.   MEDICATIONS:  1. Neurontin 300 mg t.i.d.  2. Toprol XL 100 mg daily.  3. Norvasc 10 mg daily.  4. Nitroglycerin.  5. Vytorin 10/40 daily.  6. Lexapro 10 mg daily.  7.  Tylenol.   SOCIAL HISTORY:  The patient is on disability.  She is married and has 3  children.  She quit smoking 5 years ago after 3 packs a day for 30  years.   FAMILY HISTORY:  Noncontributory for early coronary disease, though her  mother, at age 69, has stents.  There are multiple 2nd degree relatives  with later onset coronary disease.   REVIEW OF SYMPTOMS:  Positive headaches, cough, reflux, hiatal hernia,  joint pains, mild lower extremity swelling.  Negative for other systems.   PHYSICAL EXAMINATION:  The patient is in no distress.  Blood pressure  126/92, heart rate 66 and regular.  Weight 207 pounds, body mass index  37.  HEENT:  Eyes unremarkable.  Pupils are equal, round and reactive to  light.  Fundi within normal limits.  Oral mucosa unremarkable.  NECK:  No jugular venous distention.  Wave  form within normal limits.  Carotid upstroke brisk and symmetric.  No bruits or thyromegaly.  LYMPHATIC:  No cervical, axillary or inguinal adenopathy.  LUNGS:  Clear to auscultation bilaterally.  BACK:  No costovertebral angle tenderness.  CHEST:  Unremarkable.  HEART:  PMI not displaced or sustained.  S1 and S2 within normal limits.  No S3, no S4, no murmur.  ABDOMEN:  Obese.  Positive bowel sounds.  Normal in frequency and pitch.  No bruits, no rebound or guarding in her midline.  No pulsatile mass.  No hepatomegaly or splenomegaly.  SKIN:  No rashes.  EXTREMITIES:  Two plus upper pulses, two plus femorals.  Absent dorsalis  pedis and posterior tibialis on the left, two plus posterior tibialis  and dorsalis pedis on the right.  No cyanosis.  No clubbing.  NEUROLOGIC:  Oriented to person, place and time.  Cranial nerves grossly  intact.  Motor grossly intact.   EKG:  Sinus rhythm, rate 66, leftward axis, inferior and lateral T-wave  inversions that are unchanged from old EKGs, and have been present since  at least 2000.   ASSESSMENT AND PLAN:  1. The patient's chest  discomfort has some atypical features, but some      worrisome features.  She has multiple cardiovascular risk factors.      I think the pretest probability is moderate.  She would not be able      to walk on a treadmill.  She needs a stress test.  She will have an      adenosine perfusion study.  2. Hypertension.  Blood pressure is well controlled and she will      continue on the medications as listed.  3. Peripheral vascular disease.  The patient has diminished pulses and      almost definitely has peripheral vascular disease.  She does get      some pain in her legs when she walks.  Therefore, I will get ankle-      brachial indices.  4. Dyslipidemia, per her primary care physician.  5. Followup will be based on the results of the above.  I am not sure      of her lipid status.     Rollene Rotunda, MD, [facility]  Electronically Signed    JH/MedQ  DD: 07/08/2006  DT: 07/08/2006  Job #: [id number]   cc:   Vanetta Mulders, MD

## 2010-12-28 NOTE — Cardiovascular Report (Signed)
   NAMEMIRNA, SUTCLIFFE NO.:  [id number]   MEDICAL RECORD NO.:  [id number]                   PATIENT TYPE:  INP   LOCATION:  3705                                 FACILITY:  [facility]   PHYSICIAN:  Rollene Rotunda, M.D. [facility]            DATE OF BIRTH:  July 12, 1951   DATE OF PROCEDURE:  05/21/2002  DATE OF DISCHARGE:                              CARDIAC CATHETERIZATION   DATE OF BIRTH:  Jul 20, 1951   PRIMARY CARE PHYSICIAN:  [facility]   REASON FOR PRESENTATION:  Evaluate patient with progressive dyspnea and  chest discomfort suggestive of unstable angina. Of note, the patient had a  Cardiolite in May of this year which was negative for any evidence of  ischemia with a well preserved ejection fraction.   DESCRIPTION OF PROCEDURE:  Left heart catheterization was performed via the  right femoral artery. The artery was cannulated using the anterior wall  puncture. A #6 French arterial sheath was inserted via the modified  Seldinger technique. Preformed Judkins and a pigtail catheter were utilized.  The patient tolerated the procedure well and left the lab in stable  condition.   HEMODYNAMICS:  LV 172/87, AO 165/90.   CORONARY ARTERIES:  The left main was normal.   The LAD had proximal 25 and mid 25% stenosis.   The circumflex had ostial calcification.   The right coronary artery was a dominant vessel and had luminal  irregularities. Of note, all of the vessels were very tortuous.   LEFT VENTRICULOGRAM:  The left ventriculogram was obtained in the RAO  projection. The EF was somewhat difficult to estimate secondary to  ventricular ectopy. However, it appeared to be approximately 65% with well  preserved wall motion.   CONCLUSION:  Nonobstructive coronary artery disease.  Normal left  ventricular function.    PLAN:  No further cardiovascular testing is suggested.  The etiology of the  patient's dyspnea is unclear.  She will get a D-dimer and a  room air ABG.                                                   Rollene Rotunda, M.D. [facility]    JH/MEDQ  D:  05/21/2002  T:  05/24/2002  Job:  [id number]   cc:   Dala Dock

## 2010-12-28 NOTE — H&P (Signed)
[name], [name] NO.:  [id number]   MEDICAL RECORD NO.:  [id number]          PATIENT TYPE:  EMS   LOCATION:  MAJO                         FACILITY:  [facility]   PHYSICIAN:  Arvilla Meres, M.D. LHCDATE OF BIRTH:  1950/10/31   DATE OF ADMISSION:  03/19/2005  DATE OF DISCHARGE:                                HISTORY & PHYSICAL   PRIMARY CARE PHYSICIAN:  [name], M.D.   CARDIOLOGIST:  Arvilla Meres, M.D.   CHIEF COMPLAINT:  Chest pain.   HISTORY OF PRESENT ILLNESS:  [name] is a 60 year old African-American  female who presented with chest pain off and on since last Friday that had  been relieved by nitroglycerin.  The patient states there was no radiation  to shoulders or up to jaw, but she does have slight nausea.  Pain upon  admission was a 4/10.  Patient had gone to [facility] today to see Duke Salvia and she had heaviness substernal with some nausea and pain that was in  her neck and back.  The patient also stated there was no clear relations to  exertion, that this pain could happen at any time.  The patient also stated  she had pain in her stomach and the lower part of her back that was very  similar to presentation to admission in October, 2003.   PAST MEDICAL HISTORY:  Includes:  1.  A hiatal hernia.  2.  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  3.  Hypertension.  4.  Hyperlipidemia.  5.  Nonobstructive coronary artery disease.   PREVIOUS HISTORY OF SURGERY:  1.  She had a degenerative joint disorder of her patella repair in August,      2001.  2.  Cholecystectomy in November, 2004.  3.  Patient also had a prior cardiac catheterization done on May 21, 2002, which showed nonobstructive coronary artery disease, normal left      ventricular function.   MEDICATIONS INCLUDE:  1.  Hydrochlorothiazide 25 mg daily.  2.  Vasotec 10 mg daily.  3.  Motrin 800 mg t.i.d.  4.  Elavil 25 mg daily.  5.  Protonix 40 mg daily.  6.   Zocor 40 mg daily.  7.  Norvasc 10 mg daily.  8.  Toprol XL 50 mg daily.  9.  Nitroglycerin taken sublingual p.r.n.  10. Omeprazole 20 mg daily.   ALLERGIES:  Include SULFA.   SOCIAL HISTORY:  Patient lives in [place] with her husband and she is  disabled.  She is married and has three children.  Patient denies tobacco  abuse at this time, but quit approximately 5 years ago and was a 2 pack per  day smoker at that time.  Patient also states she walks for exercise but is  limited by her knee pain.  Patient also states she does not drink any  alcohol and has no dietary restrictions.  Patient denies recreational drug  use or herbal medications.   FAMILY HISTORY:  Mother is alive at 25 years old.  Father is deceased  unknown  age.  She has 1 brother with CAD.   REVIEW OF SYMPTOMS:  Is positive for headache, chest pain, shortness of  breath, dyspnea on exertion, edema in her ankles and occasional  palpitations.  Patient also says she feels presyncopal at times.  Patient is  also positive for weakness and knee pain.  Patient also states she has  abdominal pain and heat intolerance.   PHYSICAL EXAM:  Shows a temperature of 98.8, pulse of 86, respiratory rate  of 22, blood pressure of 130/86.  She is saturating 99% on room air.  Patient is in no obvious distress.  HEENT:  Pupils are equal and reactive and round to light.  No erythema or  exudate noted.  NECK:  Supple without lymphadenopathy, TMJ, bruit or JVD.  LYMPHADENOPATHY:  There is none.  CVS:  With regular rate and rhythm, S1, S2 without murmurs, rubs or gallops.  LUNGS:  Are clear to auscultation without wheezes, rhonchi.  SKIN:  No rashes or lesions.  ABDOMEN:  There is a positive CVA and low back tenderness.  Abdomen has a  positive significant epigastric tenderness, no rebound.  EXTREMITIES:  No cyanosis, clubbing or edema, rashes or lesions or  petechiae.  Pulses show no bruits.  MUSCULOSKELETAL:  There is positive for  knee pain.  NEURO:  She is alert and oriented x3.  Cranial nerves II-XII grossly intact.  Strength 5/5 all extremities.   Chest x-ray shows no active pulmonary disease.   Cath of 2003 shows left main in normal limits,  LAD had proximal 25 and mid  25 stenosis.   Circumflex had ostial calcification.   Right coronary artery was a dominant vessel and had luminal irregularities.   LABS:  Show a white blood cell count of 6.5, hemoglobin of 12.1, hematocrit  of 36.6, platelet count of 257, sodium of 140, potassium of 3.4, chloride of  107, creatinine of 0.7, blood glucose of 109.  Point of care markers are  negative x2.   ASSESSMENT AND PLAN:  This is a 60 year old female with a history of chest  pain with minimal coronary artery disease by catheterization in October,  2003, who presents with one week history of chest pain accompanied by back  and flank pain.  Both typical and atypical features.  EKG is unchanged.  Point of care markers negative times two.  Given previous catheterization,  doubt that there is a significant ischemia component despite response to  nitroglycerin.  The patient will be admitted and observed on telemetry.  1.  To rule out myocardial infarction with serial cardiac enzymes.  If      negative, adenosine Cardiolite in a.m.  2.  Check UA and amylase, lipase.  3.  No heparin for now.   Patient was seen by Dr. Gala Romney.     ______________________________  April Humphrey, NP      Arvilla Meres, M.D. [facility]  Electronically Signed    AH/MEDQ  D:  03/19/2005  T:  03/19/2005  Job:  [id number]

## 2010-12-28 NOTE — Letter (Signed)
September 14, 2006    Rollene Rotunda, MD, [facility]  [address]  [address]  [place], [place] [address]   RE:  MAYIA, MEGILL  MRN:  [id number]  /  DOB:  Dec 14, 1950   Dear Leta Jungling:   Thanks for allowing me to see [name] as a peripheral vascular  consultation.  As you know, she is a very nice, 60 year old African-  American woman who complains of bilateral leg pain.  She complains of  progressive bilateral leg pain over the last 3-4 years.  Currently, her  pain is most severe in her knees;  however, she complains of pain up and  down both legs.  Pain occurs with standing or walking.  She says that  with walking, her legs give out.  She really has had significant pain  in her knees over the last 24-48 hours, and is even tearful at today's  exam when discussing her pain.  She has seen Dr. Madelon Lips, who has scoped  her knees in the past, and she has been told that she likely needs knee  replacements.  She denies rest symptoms.  She has not had any foot  ulcerations or other signs of tissue loss.   In the setting of her known vascular disease with coronary artery  disease, as well as reduced pulses, she has undergone non-invasive lower  extremity studies that have demonstrated an ABI of 0.93 on the right,  and 0.57 on the left.  She has velocity suggestive of aortoiliac  arterial occlusive disease, more significant on the left than the right.  On the right leg, she has a less than 50% mid right SSA stenosis.   Her past medical history is pertinent for the following:  1. Essential hypertension.  2. Hyperlipidemia.  3. Nonobstructive coronary artery disease.  4. Osteoarthritis with prior arthroscopic knee surgery, and a repair      of a rotator cuff.   CURRENT MEDICATIONS:  1. Neurontin 300 mg 3 times daily.  2. Toprol XL 100 mg daily.  3. Norvasc 10 mg daily.  4. Vytorin 10/40 mg daily.  5. Lexapro 10 mg daily.   ALLERGIES:  SULFA.   SOCIAL HISTORY:  The patient is disabled.  She is  married with 3  children.  She is a prior smoker, but quit 5 years ago.  She does not  drink alcohol or use illicit drugs.  She is unable to exercise due to  her leg problems.   FAMILY HISTORY:  There is no peripheral arterial disease in the family.  There is coronary artery disease, but no premature history of coronary  artery disease.   REVIEW OF SYSTEMS:  Complete 12-point review of systems was performed.  Pertinent positives included intermittent chest pain, as well as  arthritis, multiple areas of musculoskeletal pain.  Also positive for  reflux and headaches.   PHYSICAL EXAMINATION:  The patient is alert and oriented.  She is in no  acute distress, but does become tearful at times during the exam.  Her weight is 207 pounds, blood pressure was 174/117, heart rate is 80,  respiratory rate 16.  HEENT:  Normal.  NECK:  Normal carotid upstrokes without bruits.  Jugular venous pressure  is normal.  There is no thyromegaly or thyroid nodules.  LUNGS:  Clear to auscultation bilaterally.  CARDIOVASCULAR:  The heart is regular rate and rhythm without murmurs or  gallops.  ABDOMEN:  Soft, obese, nontender, no organomegaly.  BACK:  There is no  flank tenderness.  SKIN:  There is no rash or skin lesions.  EXTREMITIES:  There is no cyanosis, clubbing, or edema.  Brachial and  radial pulses are normal bilaterally.  Left femoral pulse is 1+, right  femoral pulse is 2+.  The dorsalis pedis pulse is palpable on the left,  but is diminished at 1+.  The right dorsalis pedis is 2+.  The posterior  tibial pulse is not palpable on either side, but is obtainable by  Doppler.  There are no ulcers or skin abnormalities of the feet.  Both  knees are exquisitely tender to palpation.   ASSESSMENT:  Ms. Collantes is a 60 year old woman with bilateral leg pain.  She has clear evidence of peripheral arterial disease, especially on the  left, involving the aortoiliac vessels.  Her symptoms are really   consistent with her severe arthritis.  I am unable to elicit a history  that sounds consistent with claudication, and both legs are equally  painful.  I would favor an orthopedic evaluation first, and treatment of  her osteoarthritis.  I really think this is her main problem at this  time.  If she continues to have problems or develops symptoms suggestive  of arterial insufficiency and claudication, then it would be reasonable  to proceed with angiography in consideration of treating her probable  left iliac stenosis, which, again, is apparent on her non invasive  studies.  I have asked Ms. Donalson to contact Dr. Madelon Lips so that her  orthopedic problems can be addressed.   I would like to see her back in 6 months to see how she has progressed,  and reassess her at that time.   Leta Jungling, thanks again for allowing me to see Ms. Sorber.  Please feel free  to call at any time with questions regarding her care.    Sincerely,      Veverly Fells. Excell Seltzer, MD  Electronically Signed    MDC/MedQ  DD: 09/14/2006  DT: 09/14/2006  Job #: [id number]   CC:   Vanetta Mulders, MD  Dyke Brackett, M.D.

## 2011-01-01 ENCOUNTER — Ambulatory Visit: Payer: Medicare Other | Admitting: Hematology & Oncology

## 2011-01-11 ENCOUNTER — Other Ambulatory Visit: Payer: Self-pay | Admitting: Hematology & Oncology

## 2011-01-11 ENCOUNTER — Ambulatory Visit (HOSPITAL_BASED_OUTPATIENT_CLINIC_OR_DEPARTMENT_OTHER): Payer: Medicare Other | Admitting: Hematology & Oncology

## 2011-01-11 ENCOUNTER — Encounter (HOSPITAL_BASED_OUTPATIENT_CLINIC_OR_DEPARTMENT_OTHER): Payer: Medicare Other | Admitting: Hematology & Oncology

## 2011-01-11 DIAGNOSIS — E785 Hyperlipidemia, unspecified: Secondary | ICD-10-CM

## 2011-01-11 DIAGNOSIS — I10 Essential (primary) hypertension: Secondary | ICD-10-CM

## 2011-01-11 DIAGNOSIS — C240 Malignant neoplasm of extrahepatic bile duct: Secondary | ICD-10-CM

## 2011-01-11 LAB — CBC WITH DIFFERENTIAL (CANCER CENTER ONLY)
BASO%: 0.1 % (ref 0.0–2.0)
EOS%: 1.2 % (ref 0.0–7.0)
HCT: 37.9 % (ref 34.8–46.6)
LYMPH#: 1.8 10*3/uL (ref 0.9–3.3)
LYMPH%: 24.1 % (ref 14.0–48.0)
MCH: 29.8 pg (ref 26.0–34.0)
MCHC: 33 g/dL (ref 32.0–36.0)
MONO%: 9.4 % (ref 0.0–13.0)
NEUT%: 65.2 % (ref 39.6–80.0)
RDW: 13.7 % (ref 11.1–15.7)

## 2011-01-12 ENCOUNTER — Ambulatory Visit: Payer: Medicare Other | Admitting: Hematology & Oncology

## 2011-01-12 LAB — COMPREHENSIVE METABOLIC PANEL
ALT: 13 U/L (ref 0–35)
AST: 16 U/L (ref 0–37)
Alkaline Phosphatase: 78 U/L (ref 39–117)
BUN: 12 mg/dL (ref 6–23)
Chloride: 105 mEq/L (ref 96–112)
Creatinine, Ser: 0.7 mg/dL (ref 0.50–1.10)
Total Bilirubin: 0.4 mg/dL (ref 0.3–1.2)

## 2011-01-12 LAB — PREALBUMIN: Prealbumin: 16.5 mg/dL — ABNORMAL LOW (ref 17.0–34.0)

## 2011-01-12 LAB — CANCER ANTIGEN 19-9: CA 19-9: 124.2 U/mL — ABNORMAL HIGH (ref <35.0)

## 2011-01-14 ENCOUNTER — Other Ambulatory Visit: Payer: Self-pay | Admitting: Hematology & Oncology

## 2011-01-14 DIAGNOSIS — C240 Malignant neoplasm of extrahepatic bile duct: Secondary | ICD-10-CM

## 2011-01-16 ENCOUNTER — Other Ambulatory Visit: Payer: Self-pay | Admitting: Hematology & Oncology

## 2011-01-16 ENCOUNTER — Ambulatory Visit
Admission: RE | Admit: 2011-01-16 | Discharge: 2011-01-16 | Disposition: A | Discharge status: Home / Self Care | Payer: Medicare Other | Source: Ambulatory Visit | Attending: Radiation Oncology | Admitting: Radiation Oncology

## 2011-01-16 ENCOUNTER — Ambulatory Visit (HOSPITAL_COMMUNITY)
Admission: RE | Admit: 2011-01-16 | Discharge: 2011-01-16 | Disposition: A | Discharge status: Home / Self Care | Payer: Medicare Other | Source: Ambulatory Visit | Attending: Hematology & Oncology | Admitting: Hematology & Oncology

## 2011-01-16 ENCOUNTER — Ambulatory Visit: Payer: Medicare Other | Admitting: Radiation Oncology

## 2011-01-16 DIAGNOSIS — C240 Malignant neoplasm of extrahepatic bile duct: Secondary | ICD-10-CM

## 2011-01-16 DIAGNOSIS — C221 Intrahepatic bile duct carcinoma: Secondary | ICD-10-CM | POA: Insufficient documentation

## 2011-01-16 DIAGNOSIS — R5383 Other fatigue: Secondary | ICD-10-CM | POA: Insufficient documentation

## 2011-01-16 DIAGNOSIS — Z510 Encounter for antineoplastic radiation therapy: Secondary | ICD-10-CM | POA: Insufficient documentation

## 2011-01-16 DIAGNOSIS — R5381 Other malaise: Secondary | ICD-10-CM | POA: Insufficient documentation

## 2011-01-16 DIAGNOSIS — Z87891 Personal history of nicotine dependence: Secondary | ICD-10-CM | POA: Insufficient documentation

## 2011-01-16 DIAGNOSIS — I10 Essential (primary) hypertension: Secondary | ICD-10-CM | POA: Insufficient documentation

## 2011-01-16 DIAGNOSIS — I2510 Atherosclerotic heart disease of native coronary artery without angina pectoris: Secondary | ICD-10-CM | POA: Insufficient documentation

## 2011-01-16 LAB — APTT: aPTT: 31 seconds (ref 24–37)

## 2011-01-16 LAB — PROTIME-INR
INR: 1.06 (ref 0.00–1.49)
Prothrombin Time: 14 seconds (ref 11.6–15.2)

## 2011-01-18 ENCOUNTER — Other Ambulatory Visit (HOSPITAL_COMMUNITY): Payer: Medicare Other

## 2011-01-21 ENCOUNTER — Encounter (HOSPITAL_BASED_OUTPATIENT_CLINIC_OR_DEPARTMENT_OTHER): Payer: Medicare Other | Admitting: Hematology & Oncology

## 2011-01-21 DIAGNOSIS — Z5111 Encounter for antineoplastic chemotherapy: Secondary | ICD-10-CM

## 2011-01-21 DIAGNOSIS — C240 Malignant neoplasm of extrahepatic bile duct: Secondary | ICD-10-CM

## 2011-01-22 NOTE — Discharge Summary (Signed)
[name], [name] NO.:  [id number]  MEDICAL RECORD NO.:  [id number]  LOCATION:  6704                         FACILITY:  [facility]  PHYSICIAN:  Doneen Poisson, MD     DATE OF BIRTH:  Jul 27, 1951  DATE OF ADMISSION:  09/19/2010 DATE OF DISCHARGE:  10/05/2010                              DISCHARGE SUMMARY   DISCHARGE DIAGNOSES: 1. Extrahepatic cholangiocarcinoma. 2. Obstructive jaundice secondary to malignant stricture resolving     status post hepatic duct stent placement. 3. Acute pancreatitis status post endoscopic retrograde     cholangiopancreatography, resolving. 4. Hypertension. 5. Hyperlipidemia. 6. Gastroesophageal reflux disease with hiatal hernia. 7. Peripheral vascular disease status post right fem-pop bypass in     January 2009. 8. Nonobstructive coronary artery disease status post cardiac     catheterization in August 2011. 9. Depression. 10.History of cholecystectomy.  DISCHARGE MEDICATIONS: 1. Reglan 10 mg 1 tablet by mouth every 6 hours as needed for nausea. 2. Oxycodone 5 mg immediate release 1 tablet by mouth three times     daily as needed for pain. 3. Aspirin enteric-coated 81 mg 1 tablet by mouth daily. 4. Gabapentin 300 mg 2 capsules by mouth three times a day. 5. Nitroglycerin sublingual 0.4 mg 1 tablet under tongue every 5     minutes as needed for chest pain up to three doses. 6. Omeprazole 20 mg 1 capsule by mouth twice daily. 7. Pletal 100 mg 1 tablet by mouth twice daily. 8. Toprol XL 100 mg 1 tablet by mouth every morning.  DISPOSITION AND FOLLOW UP:  [name] was discharged from [facility] on October 05, 2010, in stable and improved condition.  She will follow up with Dr. Chauncey Mann in Surgical Oncology at [facility] on October 12, 2010, at 12 noon at which time surgical options for treatment of localized extrahepatic cholangiocarcinoma will be discussed.  She had been "cleared" by Dr. Patty Sermons of [facility] for surgery.  She has been given copies of her CT and MRI studies on CD to bring to that appointment.  CONSULTATIONS: 1. Gastroenterology, Dr. Marina Goodell. 2. Oncology, Dr. Myna Hidalgo. 3. Surgery, Dr. Donell Beers. 4. Cardiology, Dr. Patty Sermons.  PROCEDURES PERFORMED: 1. CT of abdomen and pelvis with contrast on February 8 which     demonstrated no adenopathy, free air, free fluid, or other     abnormality, benign-appearing abdomen and pelvis. 2. Abdominal ultrasound on February 8 which demonstrated slight     prominence of the intrahepatic ducts but the liver parenchyma was     normal, common bile duct and pancreas appear normal. 3. Chest x-ray on September 20, 2010, which demonstrated mild     cardiomegaly but otherwise normal. 4. Magnetic resonance cholangiopancreatography (MRCP) on February 14     which demonstrated intrahepatic biliary dilatation and normal     appearance of the extrahepatic common bile duct.  Findings     concerning for a central biliary tumor ([place] tumor), although a     discrete mass could not be identified.  Perfusion abnormality     involving the liver with possible right portal vein narrowing or     partial  obstruction also noted. 5. Endoscopic retrograde cholangiopancreatography (ERCP) on February     15 which demonstrated common hepatic duct stricture with     intrahepatic biliary dilatation, likely representing     neoplasm/cholangiocarcinoma.  Biliary stent was placed to relieve     obstruction and contrast drainage was subsequently noted from the     biliary system.  Brushings were obtained for a cytologic     examination. 6. CT of abdomen and pelvis with contrast on February 20 which     demonstrated acute pancreatitis with mild free fluid.  No evidence     of organized pseudocyst, pancreatic necrosis, or other     complications.  Stable dilatation of the right intrahepatic bile     ducts with probable centrally obstructing  cholangiocarcinoma also     noted.  ADMISSION HISTORY:  [name] is a 60 year old woman with past medical history significant for hiatal hernia, hypertension, hyperlipidemia, gastroesophageal reflux disease, depression, and history of nonobstructive coronary artery disease who presented to the [facility] ED with complaints of darkening urine since September 14, 2010.  She reported a history of nausea, vomiting, and diarrhea that had started approximately 3 weeks prior but had resolved since the last episode of emesis 5 days prior to admission and the last diarrhea 7 days prior. She felt that she was getting better and then on Friday, February 3, started to notice that her urine was getting dark.  She denied fever, chills, diaphoresis, or blood in the stools.  She had been more fatigued recently and reported a 10-pound weight loss in the last month or so without trying to lose weight.  She denied any dysuria or urinary urgency but had been urinating more frequently.  She also stated that she cares for her grandchildren during the day sometimes and several of them have been sick as well with symptoms of nausea and vomiting.  She also stated that she had had chest pain on and off for the prior 2-3 weeks.  She had a cardiac catheterization done recently in August and a "light heart attack" in the past.  The pain was described as an ache in her central chest that did not radiate.  It did not correlate with her meals or increased activity and typically went away on its own without treatment in 5-10 minutes.  PHYSICAL EXAMINATION ON ADMISSION: VITAL SIGNS:  Temperature 98.8, blood pressure 145/90, pulse 56,  respiratory rate 20, oxygen saturation 98% on room air. GENERAL APPEARANCE:  Alert, well developed, no acute distress, cooperative, alert and oriented x3. HEAD:  Normocephalic and atraumatic. EYES:  Pupils equal, round, and reactive to light.  Extraocular motion intact.  Vision grossly  intact.  Conjunctivae and sclerae are mildly icteric. MOUTH:  Moist mucous membranes.  No erythema.  No exudates or lesions. NECK:  Supple, full range of motion.  Trachea midline.  No palpable masses, JVD, or carotid bruits. LUNGS:  Clear to auscultation bilaterally.  Normal respiratory effort. HEART:  Regular rate and rhythm.  No murmurs, rubs, or gallops. ABDOMEN:  Soft, obese, nontender, nondistended.  Bowel sounds present and normoactive.  No palpable masses, CVA tenderness on the left. MUSCULOSKELETAL:  No joint swelling, warmth, or erythema. NEUROLOGIC:  Cranial nerves II through XII intact, +5 strength globally. Sensation grossly intact. SKIN:  Turgor normal and no rashes. PSYCHIATRIC:  Memory intact for recent and remote, normally interactive, good eye contact.  Affect as expected.  ADMISSION LABORATORY DATA:  Sodium 141, potassium 3.5, chloride  107, bicarb 25, BUN 10, creatinine 0.77, glucose 100, bilirubin 5.6, alkaline phosphatase 455, AST 141, ALT 285, total protein 7.3, albumin 3.4, calcium 9.5, white blood cell count 8.5, hemoglobin 12.6, hematocrit 37.3, platelet count 271, lipase 32.  HOSPITAL COURSE: 1. Extrahepatic cholangiocarcinoma.  [name] presented with     obstructive jaundice and a history of increasingly dark urine for     the past week.  LFTs were markedly abnormal with total bilirubin     elevated at 5.5, alkaline phosphatase 455, AST 141, ALT 285.  CT of     abdomen and abdominal ultrasound showed only intrahepatic     dilatation.  Acute hepatitis panel and autoimmune studies including     ANA and antimicrosomal antibody were all negative.  Dr. Marina Goodell, the     gastroenterologist, who previously performed her cholecystectomy     was consulted.  She underwent MRCP which demonstrated intrahepatic     biliary dilatation and findings concerning for a central biliary     tumor, although a discrete mass could not be identified.  She     subsequently underwent  ERCP, which identified a malignant appearing     bile duct stricture of the common hepatic duct, worrisome for     primary cholangiocarcinoma.  A biliary stent was placed which     relieved the obstruction.  Biliary brushings were obtained, which     demonstrated adenocarcinoma.  Serum CA-19-9 was markedly elevated     at 678.0 further supporting the diagnosis of cholangiocarcinoma.     Oncology, Dr. Myna Hidalgo, was consulted because the CT of     abdomen/pelvis showed no obvious malignancy or adenopathy and the     tumor appeared to be localized.  He felt the treatment would most     likely be surgical rather than chemotherapy.  Surgery, Dr. Donell Beers,     was consulted and concluded that the tumor appears to be surgically     resectable based on the imaging studies; however, they are     complex interventions and the patient would benefit from a tertiary     care center.  Dr. Donell Beers arranged for Ms. Brisbin to be seen by Dr.     Chauncey Mann at [facility].  At Dr. Arita Miss request, she     was seen by Cardiology and "cleared" for surgery.  She was     discharged with a follow up appointment March 2 with Dr. Chauncey Mann to     discuss surgical treatment options.  She was given copies of the CT      and MRI imaging studies on CD to bring to that appointment.  2. Acute pancreatitis.  Following ERCP, [name] developed severe     abdominal pain, nausea, and vomiting.  Lipase was elevated to 2713     and amylase was 1597.  She was put on bowel rest, hydrated     with IV fluids, and given IV pain medication and antiemetics.  She     was also treated with ciprofloxacin and then subsequently IV Zosyn     for several days during recovery from pancreatitis.  She recovered     slowly and initially had her diet advanced and then changed back to     clears.  A CT of the abdomen was obtained to evaluate for possible     complications of pancreatitis such as pseudocyst.  No such     complications were found,  although CT did  demonstrate significant     inflammation surrounding the pancreas.  She continued to improve     clinically.  At discharge, she still had occasional mild nausea but     no further vomiting and was tolerating a low fat solid diet.     Lipase had dropped to 79.  3. Hypertension.  [name] was previously controlled on three     antihypertensives at home; however, her blood pressure was slightly     low despite holding these medications on admission.  She also     reported slight dizziness when the blood pressure was lower than a     systolic of 110, blood pressure meds were held throughout most of     admission; however, the BP and heart rate did rise in the final days of     her admission and metoprolol was restarted.  She was discharged on     metoprolol.  Her blood pressure should continue to be managed by      her primary care physician.  4. Diarrhea.  [name] reported diarrhea while on antibiotics.  C.     diff, PCR was negative and diarrhea subsequently resolved after the     cessation of antibiotics and was therefore thought to be antibiotic     related.  5. Peripheral vascular disease.  Ms. Milewski has a history of right     femoral popliteal bypass.  Pletal was continued throughout     admission.  However, this may need to be held for her subsequent     planned surgery.  This should be discussed with the team at [facility].  DISCHARGE VITALS:  Temperature 98.2, blood pressure 137/98, pulse 85, respiratory rate 18, oxygen saturation 98% on room air.  DISCHARGE LABS:  Sodium 138, potassium 4.1, chloride 107, bicarb 23, BUN 1, creatinine 0.64, calcium 8.4, glucose 76.   ______________________________ Whitney Post, MD   ______________________________ Doneen Poisson, MD   EB/MEDQ  D:  10/09/2010  T:  10/10/2010  Job:  [id number]  cc:   Rico Junker, MD [name]. Marina Goodell, MD Josph Macho, M.D. Almond Lint, MD  Electronically Signed by Whitney Post MD on  01/20/2011 01:32:19 AM Electronically Signed by Doneen Poisson  on 01/22/2011 03:40:28 PM

## 2011-01-28 ENCOUNTER — Other Ambulatory Visit: Payer: Self-pay | Admitting: Hematology & Oncology

## 2011-01-28 ENCOUNTER — Encounter (HOSPITAL_BASED_OUTPATIENT_CLINIC_OR_DEPARTMENT_OTHER): Payer: Medicare Other | Admitting: Hematology & Oncology

## 2011-01-28 DIAGNOSIS — C240 Malignant neoplasm of extrahepatic bile duct: Secondary | ICD-10-CM

## 2011-01-28 DIAGNOSIS — Z5111 Encounter for antineoplastic chemotherapy: Secondary | ICD-10-CM

## 2011-01-28 LAB — COMPREHENSIVE METABOLIC PANEL
Alkaline Phosphatase: 81 U/L (ref 39–117)
BUN: 7 mg/dL (ref 6–23)
Glucose, Bld: 79 mg/dL (ref 70–99)
Sodium: 140 mEq/L (ref 135–145)
Total Bilirubin: 0.3 mg/dL (ref 0.3–1.2)
Total Protein: 7 g/dL (ref 6.0–8.3)

## 2011-01-28 LAB — CBC WITH DIFFERENTIAL (CANCER CENTER ONLY)
Eosinophils Absolute: 0.1 10*3/uL (ref 0.0–0.5)
LYMPH%: 15.6 % (ref 14.0–48.0)
MCH: 29.8 pg (ref 26.0–34.0)
MCV: 91 fL (ref 81–101)
MONO%: 7.6 % (ref 0.0–13.0)
Platelets: 225 10*3/uL (ref 145–400)
RBC: 4.03 10*6/uL (ref 3.70–5.32)

## 2011-01-28 LAB — CANCER ANTIGEN 19-9: CA 19-9: 185.8 U/mL — ABNORMAL HIGH (ref <35.0)

## 2011-02-04 ENCOUNTER — Other Ambulatory Visit: Payer: Self-pay | Admitting: Hematology & Oncology

## 2011-02-04 ENCOUNTER — Encounter (HOSPITAL_BASED_OUTPATIENT_CLINIC_OR_DEPARTMENT_OTHER): Payer: Medicare Other | Admitting: Hematology & Oncology

## 2011-02-04 DIAGNOSIS — C240 Malignant neoplasm of extrahepatic bile duct: Secondary | ICD-10-CM

## 2011-02-04 DIAGNOSIS — Z5111 Encounter for antineoplastic chemotherapy: Secondary | ICD-10-CM

## 2011-02-04 LAB — CBC WITH DIFFERENTIAL (CANCER CENTER ONLY)
BASO#: 0 10*3/uL (ref 0.0–0.2)
Eosinophils Absolute: 0.2 10*3/uL (ref 0.0–0.5)
HCT: 34.9 % (ref 34.8–46.6)
HGB: 11.7 g/dL (ref 11.6–15.9)
LYMPH#: 0.4 10*3/uL — ABNORMAL LOW (ref 0.9–3.3)
MONO#: 0.6 10*3/uL (ref 0.1–0.9)
NEUT%: 78.3 % (ref 39.6–80.0)
RBC: 3.88 10*6/uL (ref 3.70–5.32)
WBC: 5.5 10*3/uL (ref 3.9–10.0)

## 2011-02-04 LAB — COMPREHENSIVE METABOLIC PANEL
Albumin: 3.3 g/dL — ABNORMAL LOW (ref 3.5–5.2)
BUN: 11 mg/dL (ref 6–23)
CO2: 25 mEq/L (ref 19–32)
Calcium: 8.9 mg/dL (ref 8.4–10.5)
Chloride: 106 mEq/L (ref 96–112)
Creatinine, Ser: 0.52 mg/dL (ref 0.50–1.10)

## 2011-02-11 ENCOUNTER — Other Ambulatory Visit: Payer: Self-pay | Admitting: Hematology & Oncology

## 2011-02-11 ENCOUNTER — Encounter (HOSPITAL_BASED_OUTPATIENT_CLINIC_OR_DEPARTMENT_OTHER): Payer: Medicare Other | Admitting: Hematology & Oncology

## 2011-02-11 DIAGNOSIS — Z5111 Encounter for antineoplastic chemotherapy: Secondary | ICD-10-CM

## 2011-02-11 DIAGNOSIS — C240 Malignant neoplasm of extrahepatic bile duct: Secondary | ICD-10-CM

## 2011-02-11 LAB — CBC WITH DIFFERENTIAL (CANCER CENTER ONLY)
BASO#: 0 10*3/uL (ref 0.0–0.2)
BASO%: 0.2 % (ref 0.0–2.0)
HCT: 35.7 % (ref 34.8–46.6)
HGB: 12 g/dL (ref 11.6–15.9)
LYMPH#: 0.4 10*3/uL — ABNORMAL LOW (ref 0.9–3.3)
MONO#: 0.7 10*3/uL (ref 0.1–0.9)
NEUT#: 3.6 10*3/uL (ref 1.5–6.5)
NEUT%: 73.9 % (ref 39.6–80.0)
RDW: 13.6 % (ref 11.1–15.7)
WBC: 4.8 10*3/uL (ref 3.9–10.0)

## 2011-02-12 LAB — COMPREHENSIVE METABOLIC PANEL
ALT: 12 U/L (ref 0–35)
AST: 15 U/L (ref 0–37)
Albumin: 3.6 g/dL (ref 3.5–5.2)
CO2: 26 mEq/L (ref 19–32)
Chloride: 106 mEq/L (ref 96–112)
Creatinine, Ser: 0.6 mg/dL (ref 0.50–1.10)
Potassium: 3.9 mEq/L (ref 3.5–5.3)
Total Protein: 6.5 g/dL (ref 6.0–8.3)

## 2011-02-12 LAB — PREALBUMIN: Prealbumin: 16.7 mg/dL — ABNORMAL LOW (ref 17.0–34.0)

## 2011-02-14 ENCOUNTER — Encounter: Payer: Self-pay | Admitting: Vascular Surgery

## 2011-02-18 ENCOUNTER — Other Ambulatory Visit: Payer: Self-pay | Admitting: Hematology & Oncology

## 2011-02-18 ENCOUNTER — Encounter (HOSPITAL_BASED_OUTPATIENT_CLINIC_OR_DEPARTMENT_OTHER): Payer: Medicare Other | Admitting: Hematology & Oncology

## 2011-02-18 DIAGNOSIS — C240 Malignant neoplasm of extrahepatic bile duct: Secondary | ICD-10-CM

## 2011-02-18 DIAGNOSIS — Z5111 Encounter for antineoplastic chemotherapy: Secondary | ICD-10-CM

## 2011-02-18 LAB — CBC WITH DIFFERENTIAL (CANCER CENTER ONLY)
BASO#: 0 10*3/uL (ref 0.0–0.2)
Eosinophils Absolute: 0.1 10*3/uL (ref 0.0–0.5)
HCT: 36.1 % (ref 34.8–46.6)
HGB: 12.1 g/dL (ref 11.6–15.9)
LYMPH#: 0.4 10*3/uL — ABNORMAL LOW (ref 0.9–3.3)
MONO#: 0.6 10*3/uL (ref 0.1–0.9)
NEUT%: 76.4 % (ref 39.6–80.0)
WBC: 5 10*3/uL (ref 3.9–10.0)

## 2011-02-18 LAB — COMPREHENSIVE METABOLIC PANEL
ALT: 10 U/L (ref 0–35)
BUN: 10 mg/dL (ref 6–23)
CO2: 26 mEq/L (ref 19–32)
Calcium: 9.4 mg/dL (ref 8.4–10.5)
Chloride: 107 mEq/L (ref 96–112)
Creatinine, Ser: 0.6 mg/dL (ref 0.50–1.10)

## 2011-02-25 ENCOUNTER — Other Ambulatory Visit: Payer: Self-pay | Admitting: Hematology & Oncology

## 2011-02-25 ENCOUNTER — Encounter: Payer: Medicare Other | Admitting: Hematology & Oncology

## 2011-02-25 ENCOUNTER — Encounter (HOSPITAL_BASED_OUTPATIENT_CLINIC_OR_DEPARTMENT_OTHER): Payer: Medicare Other | Admitting: Hematology & Oncology

## 2011-02-25 DIAGNOSIS — C240 Malignant neoplasm of extrahepatic bile duct: Secondary | ICD-10-CM

## 2011-02-25 DIAGNOSIS — Z5111 Encounter for antineoplastic chemotherapy: Secondary | ICD-10-CM

## 2011-02-25 LAB — COMPREHENSIVE METABOLIC PANEL
ALT: 17 U/L (ref 0–35)
Albumin: 3.2 g/dL — ABNORMAL LOW (ref 3.5–5.2)
CO2: 23 mEq/L (ref 19–32)
Calcium: 9 mg/dL (ref 8.4–10.5)
Chloride: 109 mEq/L (ref 96–112)
Creatinine, Ser: 0.63 mg/dL (ref 0.50–1.10)
Potassium: 3.6 mEq/L (ref 3.5–5.3)
Total Protein: 6 g/dL (ref 6.0–8.3)

## 2011-02-25 LAB — CANCER ANTIGEN 19-9: CA 19-9: 163.5 U/mL — ABNORMAL HIGH (ref <35.0)

## 2011-02-25 LAB — CBC WITH DIFFERENTIAL (CANCER CENTER ONLY)
BASO%: 0.2 % (ref 0.0–2.0)
HCT: 34.1 % — ABNORMAL LOW (ref 34.8–46.6)
HGB: 11.6 g/dL (ref 11.6–15.9)
LYMPH#: 0.3 10*3/uL — ABNORMAL LOW (ref 0.9–3.3)
MONO#: 0.8 10*3/uL (ref 0.1–0.9)
NEUT%: 74 % (ref 39.6–80.0)
RDW: 14.2 % (ref 11.1–15.7)
WBC: 4.7 10*3/uL (ref 3.9–10.0)

## 2011-02-25 LAB — PREALBUMIN: Prealbumin: 14.7 mg/dL — ABNORMAL LOW (ref 17.0–34.0)

## 2011-02-26 ENCOUNTER — Encounter (HOSPITAL_BASED_OUTPATIENT_CLINIC_OR_DEPARTMENT_OTHER): Payer: Medicare Other | Admitting: Hematology & Oncology

## 2011-02-26 ENCOUNTER — Other Ambulatory Visit: Payer: Self-pay | Admitting: Hematology & Oncology

## 2011-02-26 DIAGNOSIS — C240 Malignant neoplasm of extrahepatic bile duct: Secondary | ICD-10-CM

## 2011-03-04 ENCOUNTER — Encounter (HOSPITAL_BASED_OUTPATIENT_CLINIC_OR_DEPARTMENT_OTHER): Payer: Medicare Other | Admitting: Hematology & Oncology

## 2011-03-04 DIAGNOSIS — C240 Malignant neoplasm of extrahepatic bile duct: Secondary | ICD-10-CM

## 2011-03-04 DIAGNOSIS — Z452 Encounter for adjustment and management of vascular access device: Secondary | ICD-10-CM

## 2011-03-06 ENCOUNTER — Ambulatory Visit: Payer: Self-pay | Admitting: Vascular Surgery

## 2011-03-07 ENCOUNTER — Other Ambulatory Visit: Payer: Self-pay | Admitting: *Deleted

## 2011-03-07 ENCOUNTER — Other Ambulatory Visit: Payer: Self-pay | Admitting: Internal Medicine

## 2011-03-07 MED ORDER — HYDROCHLOROTHIAZIDE 25 MG PO TABS: 25.0000 mg | ORAL_TABLET | Freq: Every day | ORAL | Status: DC

## 2011-03-07 MED ORDER — CILOSTAZOL 100 MG PO TABS: 100.0000 mg | ORAL_TABLET | Freq: Two times a day (BID) | ORAL | Status: DC

## 2011-03-07 MED ORDER — AMLODIPINE BESYLATE 10 MG PO TABS: 10.0000 mg | ORAL_TABLET | Freq: Every day | ORAL | Status: DC

## 2011-03-07 MED ORDER — HYDROCODONE-ACETAMINOPHEN 10-660 MG PO TABS: 1.0000 | ORAL_TABLET | ORAL | Status: DC | PRN

## 2011-03-07 MED ORDER — SIMVASTATIN 40 MG PO TABS: 40.0000 mg | ORAL_TABLET | Freq: Every day | ORAL | Status: DC

## 2011-03-07 NOTE — Telephone Encounter (Signed)
Rxs were refilled by Dr. Coralee Pesa; request forms faxed to [facility]; pt was called and informed to keep her appt.

## 2011-03-07 NOTE — Telephone Encounter (Signed)
Pt has an appt  Sept 7 w/Dr. Dorise Hiss.

## 2011-03-08 ENCOUNTER — Encounter (INDEPENDENT_AMBULATORY_CARE_PROVIDER_SITE_OTHER): Payer: Medicare Other

## 2011-03-08 DIAGNOSIS — I739 Peripheral vascular disease, unspecified: Secondary | ICD-10-CM

## 2011-03-08 DIAGNOSIS — Z48812 Encounter for surgical aftercare following surgery on the circulatory system: Secondary | ICD-10-CM

## 2011-03-15 ENCOUNTER — Encounter: Payer: Medicare Other | Admitting: Internal Medicine

## 2011-03-26 ENCOUNTER — Ambulatory Visit (HOSPITAL_BASED_OUTPATIENT_CLINIC_OR_DEPARTMENT_OTHER)
Admission: RE | Admit: 2011-03-26 | Discharge: 2011-03-26 | Disposition: A | Discharge status: Home / Self Care | Payer: Medicare Other | Source: Ambulatory Visit | Attending: Hematology & Oncology | Admitting: Hematology & Oncology

## 2011-03-26 ENCOUNTER — Ambulatory Visit (INDEPENDENT_AMBULATORY_CARE_PROVIDER_SITE_OTHER)
Admission: RE | Admit: 2011-03-26 | Discharge: 2011-03-26 | Disposition: A | Discharge status: Home / Self Care | Payer: Medicare Other | Source: Ambulatory Visit | Attending: Hematology & Oncology | Admitting: Hematology & Oncology

## 2011-03-26 DIAGNOSIS — C240 Malignant neoplasm of extrahepatic bile duct: Secondary | ICD-10-CM

## 2011-03-26 DIAGNOSIS — R918 Other nonspecific abnormal finding of lung field: Secondary | ICD-10-CM

## 2011-03-26 DIAGNOSIS — J90 Pleural effusion, not elsewhere classified: Secondary | ICD-10-CM

## 2011-03-26 DIAGNOSIS — C221 Intrahepatic bile duct carcinoma: Secondary | ICD-10-CM | POA: Insufficient documentation

## 2011-03-26 DIAGNOSIS — D3500 Benign neoplasm of unspecified adrenal gland: Secondary | ICD-10-CM | POA: Insufficient documentation

## 2011-03-26 DIAGNOSIS — J984 Other disorders of lung: Secondary | ICD-10-CM | POA: Insufficient documentation

## 2011-03-26 MED ORDER — IOHEXOL 300 MG/ML  SOLN
100.0000 mL | Freq: Once | INTRAMUSCULAR | Status: AC | PRN
  Administered 2011-03-26: 100 mL via INTRAVENOUS

## 2011-04-04 ENCOUNTER — Other Ambulatory Visit: Payer: Self-pay | Admitting: Hematology & Oncology

## 2011-04-04 ENCOUNTER — Encounter (HOSPITAL_BASED_OUTPATIENT_CLINIC_OR_DEPARTMENT_OTHER): Payer: Medicare Other | Admitting: Hematology & Oncology

## 2011-04-04 DIAGNOSIS — Z5111 Encounter for antineoplastic chemotherapy: Secondary | ICD-10-CM

## 2011-04-04 DIAGNOSIS — R918 Other nonspecific abnormal finding of lung field: Secondary | ICD-10-CM

## 2011-04-04 DIAGNOSIS — C240 Malignant neoplasm of extrahepatic bile duct: Secondary | ICD-10-CM

## 2011-04-04 LAB — COMPREHENSIVE METABOLIC PANEL
BUN: 9 mg/dL (ref 6–23)
CO2: 29 mEq/L (ref 19–32)
Calcium: 9.5 mg/dL (ref 8.4–10.5)
Chloride: 102 mEq/L (ref 96–112)
Creatinine, Ser: 0.65 mg/dL (ref 0.50–1.10)

## 2011-04-04 LAB — CBC WITH DIFFERENTIAL (CANCER CENTER ONLY)
BASO#: 0 10*3/uL (ref 0.0–0.2)
HCT: 36.8 % (ref 34.8–46.6)
HGB: 12.5 g/dL (ref 11.6–15.9)
LYMPH#: 0.4 10*3/uL — ABNORMAL LOW (ref 0.9–3.3)
LYMPH%: 10.3 % — ABNORMAL LOW (ref 14.0–48.0)
MCHC: 34 g/dL (ref 32.0–36.0)
MCV: 91 fL (ref 81–101)
MONO#: 0.3 10*3/uL (ref 0.1–0.9)
NEUT%: 78.6 % (ref 39.6–80.0)
WBC: 3.8 10*3/uL — ABNORMAL LOW (ref 3.9–10.0)

## 2011-04-04 LAB — PREALBUMIN: Prealbumin: 15 mg/dL — ABNORMAL LOW (ref 17.0–34.0)

## 2011-04-08 ENCOUNTER — Encounter (HOSPITAL_BASED_OUTPATIENT_CLINIC_OR_DEPARTMENT_OTHER): Payer: Medicare Other | Admitting: Hematology & Oncology

## 2011-04-08 DIAGNOSIS — C240 Malignant neoplasm of extrahepatic bile duct: Secondary | ICD-10-CM

## 2011-04-08 DIAGNOSIS — Z5111 Encounter for antineoplastic chemotherapy: Secondary | ICD-10-CM

## 2011-04-09 ENCOUNTER — Encounter (HOSPITAL_BASED_OUTPATIENT_CLINIC_OR_DEPARTMENT_OTHER): Payer: Medicare Other | Admitting: Hematology & Oncology

## 2011-04-09 ENCOUNTER — Encounter: Payer: Medicare Other | Admitting: Internal Medicine

## 2011-04-09 DIAGNOSIS — Z5111 Encounter for antineoplastic chemotherapy: Secondary | ICD-10-CM

## 2011-04-09 DIAGNOSIS — C240 Malignant neoplasm of extrahepatic bile duct: Secondary | ICD-10-CM

## 2011-04-10 ENCOUNTER — Encounter: Payer: Medicare Other | Admitting: Internal Medicine

## 2011-04-11 ENCOUNTER — Encounter: Payer: Medicare Other | Admitting: Internal Medicine

## 2011-04-16 ENCOUNTER — Encounter (HOSPITAL_BASED_OUTPATIENT_CLINIC_OR_DEPARTMENT_OTHER): Payer: Medicare Other | Admitting: Hematology & Oncology

## 2011-04-16 ENCOUNTER — Other Ambulatory Visit: Payer: Self-pay | Admitting: Hematology & Oncology

## 2011-04-16 DIAGNOSIS — Z5111 Encounter for antineoplastic chemotherapy: Secondary | ICD-10-CM

## 2011-04-16 DIAGNOSIS — C240 Malignant neoplasm of extrahepatic bile duct: Secondary | ICD-10-CM

## 2011-04-16 LAB — CBC WITH DIFFERENTIAL (CANCER CENTER ONLY)
BASO#: 0 10*3/uL (ref 0.0–0.2)
EOS%: 1 % (ref 0.0–7.0)
Eosinophils Absolute: 0 10*3/uL (ref 0.0–0.5)
HGB: 11.8 g/dL (ref 11.6–15.9)
LYMPH#: 0.3 10*3/uL — ABNORMAL LOW (ref 0.9–3.3)
MONO#: 0.4 10*3/uL (ref 0.1–0.9)
NEUT#: 2.4 10*3/uL (ref 1.5–6.5)
RBC: 3.85 10*6/uL (ref 3.70–5.32)
WBC: 3 10*3/uL — ABNORMAL LOW (ref 3.9–10.0)

## 2011-04-17 ENCOUNTER — Ambulatory Visit
Admission: RE | Admit: 2011-04-17 | Discharge: 2011-04-17 | Disposition: A | Discharge status: Home / Self Care | Payer: Medicare Other | Source: Ambulatory Visit | Attending: Radiation Oncology | Admitting: Radiation Oncology

## 2011-04-19 ENCOUNTER — Encounter: Payer: Medicare Other | Admitting: Internal Medicine

## 2011-04-29 ENCOUNTER — Other Ambulatory Visit: Payer: Self-pay | Admitting: Hematology & Oncology

## 2011-04-29 ENCOUNTER — Encounter (HOSPITAL_BASED_OUTPATIENT_CLINIC_OR_DEPARTMENT_OTHER): Payer: Medicare Other | Admitting: Hematology & Oncology

## 2011-04-29 DIAGNOSIS — C240 Malignant neoplasm of extrahepatic bile duct: Secondary | ICD-10-CM

## 2011-04-29 DIAGNOSIS — Z5111 Encounter for antineoplastic chemotherapy: Secondary | ICD-10-CM

## 2011-04-29 LAB — COMPREHENSIVE METABOLIC PANEL
Albumin: 3.4 g/dL — ABNORMAL LOW (ref 3.5–5.2)
Alkaline Phosphatase: 93 U/L (ref 39–117)
BUN: 9 mg/dL (ref 6–23)
Glucose, Bld: 85 mg/dL (ref 70–99)
Total Bilirubin: 0.4 mg/dL (ref 0.3–1.2)

## 2011-04-29 LAB — CBC WITH DIFFERENTIAL (CANCER CENTER ONLY)
BASO#: 0 10*3/uL (ref 0.0–0.2)
Eosinophils Absolute: 0 10*3/uL (ref 0.0–0.5)
HCT: 31.6 % — ABNORMAL LOW (ref 34.8–46.6)
HGB: 10.8 g/dL — ABNORMAL LOW (ref 11.6–15.9)
LYMPH%: 9.9 % — ABNORMAL LOW (ref 14.0–48.0)
MCH: 31.5 pg (ref 26.0–34.0)
MCV: 92 fL (ref 81–101)
MONO#: 0.6 10*3/uL (ref 0.1–0.9)
MONO%: 19.5 % — ABNORMAL HIGH (ref 0.0–13.0)
Platelets: 232 10*3/uL (ref 145–400)
RBC: 3.43 10*6/uL — ABNORMAL LOW (ref 3.70–5.32)
WBC: 3 10*3/uL — ABNORMAL LOW (ref 3.9–10.0)

## 2011-04-29 LAB — CANCER ANTIGEN 19-9: CA 19-9: 155.4 U/mL — ABNORMAL HIGH (ref <35.0)

## 2011-04-29 LAB — TECHNOLOGIST REVIEW CHCC SATELLITE

## 2011-04-30 ENCOUNTER — Encounter (HOSPITAL_BASED_OUTPATIENT_CLINIC_OR_DEPARTMENT_OTHER): Payer: Medicare Other | Admitting: Hematology & Oncology

## 2011-04-30 DIAGNOSIS — C240 Malignant neoplasm of extrahepatic bile duct: Secondary | ICD-10-CM

## 2011-04-30 DIAGNOSIS — Z5111 Encounter for antineoplastic chemotherapy: Secondary | ICD-10-CM

## 2011-05-01 LAB — BASIC METABOLIC PANEL
CO2: 27
Calcium: 8.6
Chloride: 98
Creatinine, Ser: 0.89
GFR calc Af Amer: >60
GFR calc Af Amer: >60
Sodium: 132 — ABNORMAL LOW

## 2011-05-01 LAB — ABO/RH: ABO/RH(D): A POS

## 2011-05-01 LAB — COMPREHENSIVE METABOLIC PANEL
ALT: 32
Albumin: 3.9
Alkaline Phosphatase: 87
BUN: 7
Chloride: 102
Potassium: 3.8
Total Bilirubin: 0.5

## 2011-05-01 LAB — URINALYSIS, ROUTINE W REFLEX MICROSCOPIC
Bilirubin Urine: NEGATIVE
Glucose, UA: NEGATIVE
Hgb urine dipstick: NEGATIVE
Ketones, ur: NEGATIVE
Ketones, ur: NEGATIVE
Nitrite: NEGATIVE
Protein, ur: NEGATIVE
Specific Gravity, Urine: 1.021
Urobilinogen, UA: 0.2

## 2011-05-01 LAB — CBC
HCT: 40.4
Hemoglobin: 10.2 — ABNORMAL LOW
Hemoglobin: 11 — ABNORMAL LOW
Hemoglobin: 13.2
MCHC: 33
MCHC: 33.9
MCV: 86.2
MCV: 86.4
Platelets: 317
RBC: 3.82 — ABNORMAL LOW
RBC: 3.86 — ABNORMAL LOW
RDW: 13.1
WBC: 10.7 — ABNORMAL HIGH
WBC: 6.9

## 2011-05-01 LAB — URINE MICROSCOPIC-ADD ON

## 2011-05-01 LAB — URINE CULTURE: Culture: NO GROWTH

## 2011-05-01 LAB — TYPE AND SCREEN
ABO/RH(D): A POS
Antibody Screen: NEGATIVE

## 2011-05-01 LAB — APTT: aPTT: 27

## 2011-05-01 LAB — PROTIME-INR: INR: 0.9

## 2011-05-02 LAB — CBC
HCT: 25.5 — ABNORMAL LOW
MCHC: 32.5
MCHC: 33.5
MCV: 86.3
MCV: 86.9
Platelets: 328
Platelets: 392
RDW: 13.2

## 2011-05-02 LAB — WOUND CULTURE: Culture: NO GROWTH

## 2011-05-02 LAB — BASIC METABOLIC PANEL
BUN: 8
CO2: 25
Calcium: 8.8
Creatinine, Ser: 0.78
GFR calc Af Amer: >60
Glucose, Bld: 96

## 2011-05-02 LAB — PROTIME-INR
INR: 1.1
Prothrombin Time: 13.9

## 2011-05-02 LAB — ANAEROBIC CULTURE

## 2011-05-06 ENCOUNTER — Other Ambulatory Visit: Payer: Self-pay | Admitting: Hematology & Oncology

## 2011-05-06 ENCOUNTER — Encounter (HOSPITAL_BASED_OUTPATIENT_CLINIC_OR_DEPARTMENT_OTHER): Payer: Medicare Other | Admitting: Hematology & Oncology

## 2011-05-06 DIAGNOSIS — Z5111 Encounter for antineoplastic chemotherapy: Secondary | ICD-10-CM

## 2011-05-06 DIAGNOSIS — C240 Malignant neoplasm of extrahepatic bile duct: Secondary | ICD-10-CM

## 2011-05-06 DIAGNOSIS — Z452 Encounter for adjustment and management of vascular access device: Secondary | ICD-10-CM

## 2011-05-06 LAB — CBC WITH DIFFERENTIAL (CANCER CENTER ONLY)
BASO%: 0 % (ref 0.0–2.0)
EOS%: 0.5 % (ref 0.0–7.0)
HCT: 29.5 % — ABNORMAL LOW (ref 34.8–46.6)
LYMPH#: 0.4 10*3/uL — ABNORMAL LOW (ref 0.9–3.3)
LYMPH%: 20.3 % (ref 14.0–48.0)
MCHC: 33.9 g/dL (ref 32.0–36.0)
MCV: 93 fL (ref 81–101)
MONO#: 0.4 10*3/uL (ref 0.1–0.9)
NEUT%: 61.9 % (ref 39.6–80.0)
Platelets: 200 10*3/uL (ref 145–400)
RDW: 13.5 % (ref 11.1–15.7)
WBC: 2 10*3/uL — ABNORMAL LOW (ref 3.9–10.0)

## 2011-05-06 LAB — CMP (CANCER CENTER ONLY)
ALT(SGPT): 33 U/L (ref 10–47)
AST: 26 U/L (ref 11–38)
CO2: 28 mEq/L (ref 18–33)
Creat: 0.7 mg/dl (ref 0.6–1.2)
Sodium: 142 mEq/L (ref 128–145)
Total Bilirubin: 0.3 mg/dl (ref 0.20–1.60)
Total Protein: 6.2 g/dL — ABNORMAL LOW (ref 6.4–8.1)

## 2011-05-13 ENCOUNTER — Ambulatory Visit (HOSPITAL_BASED_OUTPATIENT_CLINIC_OR_DEPARTMENT_OTHER)
Admission: RE | Admit: 2011-05-13 | Discharge: 2011-05-13 | Disposition: A | Discharge status: Home / Self Care | Payer: Medicare Other | Source: Ambulatory Visit | Attending: Hematology & Oncology | Admitting: Hematology & Oncology

## 2011-05-13 ENCOUNTER — Ambulatory Visit (INDEPENDENT_AMBULATORY_CARE_PROVIDER_SITE_OTHER)
Admission: RE | Admit: 2011-05-13 | Discharge: 2011-05-13 | Disposition: A | Discharge status: Home / Self Care | Payer: Medicare Other | Source: Ambulatory Visit | Attending: Hematology & Oncology | Admitting: Hematology & Oncology

## 2011-05-13 DIAGNOSIS — C240 Malignant neoplasm of extrahepatic bile duct: Secondary | ICD-10-CM

## 2011-05-13 DIAGNOSIS — D3500 Benign neoplasm of unspecified adrenal gland: Secondary | ICD-10-CM | POA: Insufficient documentation

## 2011-05-13 DIAGNOSIS — C7800 Secondary malignant neoplasm of unspecified lung: Secondary | ICD-10-CM

## 2011-05-13 MED ORDER — IOHEXOL 300 MG/ML  SOLN
100.0000 mL | Freq: Once | INTRAMUSCULAR | Status: AC | PRN
  Administered 2011-05-13: 100 mL via INTRAVENOUS

## 2011-05-20 ENCOUNTER — Encounter (HOSPITAL_BASED_OUTPATIENT_CLINIC_OR_DEPARTMENT_OTHER): Payer: Medicare Other | Admitting: Hematology & Oncology

## 2011-05-20 ENCOUNTER — Other Ambulatory Visit: Payer: Self-pay | Admitting: Hematology & Oncology

## 2011-05-20 DIAGNOSIS — Z5111 Encounter for antineoplastic chemotherapy: Secondary | ICD-10-CM

## 2011-05-20 DIAGNOSIS — C240 Malignant neoplasm of extrahepatic bile duct: Secondary | ICD-10-CM

## 2011-05-20 DIAGNOSIS — Z452 Encounter for adjustment and management of vascular access device: Secondary | ICD-10-CM

## 2011-05-20 LAB — CBC WITH DIFFERENTIAL (CANCER CENTER ONLY)
BASO%: 0.2 % (ref 0.0–2.0)
EOS%: 1.2 % (ref 0.0–7.0)
LYMPH#: 0.5 10*3/uL — ABNORMAL LOW (ref 0.9–3.3)
MCHC: 33.2 g/dL (ref 32.0–36.0)
NEUT#: 2.5 10*3/uL (ref 1.5–6.5)
RDW: 14.6 % (ref 11.1–15.7)

## 2011-05-20 LAB — CMP (CANCER CENTER ONLY)
ALT(SGPT): 13 U/L (ref 10–47)
AST: 22 U/L (ref 11–38)
Albumin: 3.3 g/dL (ref 3.3–5.5)
Alkaline Phosphatase: 98 U/L — ABNORMAL HIGH (ref 26–84)
Calcium: 9.2 mg/dL (ref 8.0–10.3)
Chloride: 104 mEq/L (ref 98–108)
Creat: 0.6 mg/dl (ref 0.6–1.2)
Potassium: 3.9 mEq/L (ref 3.3–4.7)

## 2011-05-21 ENCOUNTER — Encounter (HOSPITAL_BASED_OUTPATIENT_CLINIC_OR_DEPARTMENT_OTHER): Payer: Medicare Other | Admitting: Hematology & Oncology

## 2011-05-21 DIAGNOSIS — Z5111 Encounter for antineoplastic chemotherapy: Secondary | ICD-10-CM

## 2011-05-21 DIAGNOSIS — C240 Malignant neoplasm of extrahepatic bile duct: Secondary | ICD-10-CM

## 2011-05-22 LAB — BASIC METABOLIC PANEL
BUN: 10
BUN: 11
BUN: 6
CO2: 28
Calcium: 8.9
Calcium: 9.9
Chloride: 100
Chloride: 103
Chloride: 99
Creatinine, Ser: 0.69
Creatinine, Ser: 0.7
GFR calc Af Amer: >60
GFR calc Af Amer: >60
GFR calc non Af Amer: >60
GFR calc non Af Amer: >60
Glucose, Bld: 122 — ABNORMAL HIGH
Glucose, Bld: 88
Glucose, Bld: 94
Potassium: 3.7
Potassium: 3.8
Potassium: 4.4
Sodium: 136
Sodium: 138

## 2011-05-22 LAB — DIFFERENTIAL
Basophils Absolute: 0
Basophils Relative: 1
Eosinophils Absolute: 0.1
Eosinophils Relative: 1
Lymphocytes Relative: 37
Lymphs Abs: 2.9
Monocytes Absolute: 0.6
Monocytes Relative: 8
Neutro Abs: 4.3
Neutrophils Relative %: 54

## 2011-05-22 LAB — CSF CELL COUNT WITH DIFFERENTIAL
Eosinophils, CSF: 0
Eosinophils, CSF: 1
Lymphs, CSF: 65
Lymphs, CSF: 83 — ABNORMAL HIGH
Monocyte-Macrophage-Spinal Fluid: 2 — ABNORMAL LOW
Monocyte-Macrophage-Spinal Fluid: 2 — ABNORMAL LOW
RBC Count, CSF: 23000 — ABNORMAL HIGH
RBC Count, CSF: 27500 — ABNORMAL HIGH
Segmented Neutrophils-CSF: 15 — ABNORMAL HIGH
Segmented Neutrophils-CSF: 32 — ABNORMAL HIGH
Tube #: 1
Tube #: 4
WBC, CSF: 37 — ABNORMAL HIGH
WBC, CSF: 44 — ABNORMAL HIGH

## 2011-05-22 LAB — CBC
HCT: 32.2 — ABNORMAL LOW
HCT: 34.3 — ABNORMAL LOW
HCT: 40
Hemoglobin: 11.5 — ABNORMAL LOW
Hemoglobin: 13.3
MCHC: 33.2
MCV: 86.3
MCV: 86.3
MCV: 86.4
Platelets: 240
Platelets: 255
Platelets: 265
Platelets: 281
RBC: 3.99
RBC: 4.64
RDW: 13.4
RDW: 13.5
RDW: 13.8
WBC: 7.6
WBC: 7.9
WBC: 8.2
WBC: 9.3

## 2011-05-22 LAB — PROTIME-INR
INR: 0.9
Prothrombin Time: 12.6

## 2011-05-22 LAB — BASIC METABOLIC PANEL WITH GFR
BUN: 8
Creatinine, Ser: 0.81
GFR calc non Af Amer: >60

## 2011-05-22 LAB — PROTEIN, CSF: Total  Protein, CSF: 87 — ABNORMAL HIGH

## 2011-05-22 LAB — APTT: aPTT: 27

## 2011-05-22 LAB — GLUCOSE, CSF: Glucose, CSF: 58

## 2011-05-23 ENCOUNTER — Telehealth: Payer: Self-pay

## 2011-05-23 NOTE — Telephone Encounter (Signed)
[name] called on 05/23/11 requesting results from most recent "test".  She was here in July 2012 for ABI's.  Previous notes from [name] have requested ABI's in July and January.  I told the patient that the ABI's were stable when compared to previous and that she should return to see [name] and get ABI's in January 2013.  Her chart was then given to scheduling to make the appointment and notify the patient. She was also instructed to have her oncologist call for a report at her next appointment and that the ABI's would be faxed to the office.

## 2011-05-24 ENCOUNTER — Encounter: Payer: Medicare Other | Admitting: Internal Medicine

## 2011-05-24 LAB — CBC
HCT: 34.8 — ABNORMAL LOW
Hemoglobin: 11.8 — ABNORMAL LOW
MCV: 85.8
RBC: 4.06
WBC: 6.8

## 2011-05-24 LAB — BASIC METABOLIC PANEL
CO2: 30
Chloride: 105
Creatinine, Ser: 0.79
GFR calc Af Amer: >60
Potassium: 3.8
Sodium: 144

## 2011-05-27 ENCOUNTER — Other Ambulatory Visit: Payer: Self-pay | Admitting: Hematology & Oncology

## 2011-05-27 ENCOUNTER — Encounter (HOSPITAL_BASED_OUTPATIENT_CLINIC_OR_DEPARTMENT_OTHER): Payer: Medicare Other | Admitting: Hematology & Oncology

## 2011-05-27 DIAGNOSIS — Z452 Encounter for adjustment and management of vascular access device: Secondary | ICD-10-CM

## 2011-05-27 DIAGNOSIS — Z5111 Encounter for antineoplastic chemotherapy: Secondary | ICD-10-CM

## 2011-05-27 DIAGNOSIS — C240 Malignant neoplasm of extrahepatic bile duct: Secondary | ICD-10-CM

## 2011-05-27 LAB — CBC WITH DIFFERENTIAL (CANCER CENTER ONLY)
BASO#: 0 10*3/uL (ref 0.0–0.2)
Eosinophils Absolute: 0 10*3/uL (ref 0.0–0.5)
HCT: 24.3 % — ABNORMAL LOW (ref 34.8–46.6)
HGB: 8.3 g/dL — ABNORMAL LOW (ref 11.6–15.9)
LYMPH%: 11.9 % — ABNORMAL LOW (ref 14.0–48.0)
MCH: 31.6 pg (ref 26.0–34.0)
MCV: 92 fL (ref 81–101)
MONO#: 0.8 10*3/uL (ref 0.1–0.9)
MONO%: 23.8 % — ABNORMAL HIGH (ref 0.0–13.0)
RBC: 2.63 10*6/uL — ABNORMAL LOW (ref 3.70–5.32)
WBC: 3.2 10*3/uL — ABNORMAL LOW (ref 3.9–10.0)

## 2011-06-10 ENCOUNTER — Encounter (HOSPITAL_COMMUNITY)
Admission: RE | Admit: 2011-06-10 | Discharge: 2011-06-10 | Disposition: A | Discharge status: Home / Self Care | Payer: Medicare Other | Source: Ambulatory Visit | Attending: Hematology & Oncology | Admitting: Hematology & Oncology

## 2011-06-10 ENCOUNTER — Other Ambulatory Visit: Payer: Self-pay | Admitting: Hematology & Oncology

## 2011-06-10 ENCOUNTER — Other Ambulatory Visit: Payer: Self-pay | Admitting: Family

## 2011-06-10 ENCOUNTER — Encounter (HOSPITAL_BASED_OUTPATIENT_CLINIC_OR_DEPARTMENT_OTHER): Payer: Medicare Other | Admitting: Hematology & Oncology

## 2011-06-10 DIAGNOSIS — Z5111 Encounter for antineoplastic chemotherapy: Secondary | ICD-10-CM

## 2011-06-10 DIAGNOSIS — R110 Nausea: Secondary | ICD-10-CM

## 2011-06-10 DIAGNOSIS — D649 Anemia, unspecified: Secondary | ICD-10-CM | POA: Insufficient documentation

## 2011-06-10 DIAGNOSIS — C240 Malignant neoplasm of extrahepatic bile duct: Secondary | ICD-10-CM

## 2011-06-10 DIAGNOSIS — Z452 Encounter for adjustment and management of vascular access device: Secondary | ICD-10-CM

## 2011-06-10 LAB — CBC WITH DIFFERENTIAL (CANCER CENTER ONLY)
BASO#: 0 10*3/uL (ref 0.0–0.2)
EOS%: 0.6 % (ref 0.0–7.0)
HCT: 23.7 % — ABNORMAL LOW (ref 34.8–46.6)
LYMPH#: 0.5 10*3/uL — ABNORMAL LOW (ref 0.9–3.3)
MCH: 31.3 pg (ref 26.0–34.0)
MCV: 96 fL (ref 81–101)
NEUT#: 1.7 10*3/uL (ref 1.5–6.5)
Platelets: 287 10*3/uL (ref 145–400)
RDW: 17.3 % — ABNORMAL HIGH (ref 11.1–15.7)
WBC: 3.3 10*3/uL — ABNORMAL LOW (ref 3.9–10.0)

## 2011-06-10 LAB — ABO/RH: ABO/RH(D): A POS

## 2011-06-10 LAB — COMPREHENSIVE METABOLIC PANEL
BUN: 11 mg/dL (ref 6–23)
CO2: 26 mEq/L (ref 19–32)
Creatinine, Ser: 0.65 mg/dL (ref 0.50–1.10)
Glucose, Bld: 81 mg/dL (ref 70–99)
Total Bilirubin: 0.2 mg/dL — ABNORMAL LOW (ref 0.3–1.2)

## 2011-06-10 LAB — LACTATE DEHYDROGENASE: LDH: 168 U/L (ref 94–250)

## 2011-06-10 LAB — CANCER ANTIGEN 19-9: CA 19-9: 88.8 U/mL — ABNORMAL HIGH (ref <35.0)

## 2011-06-11 ENCOUNTER — Encounter (HOSPITAL_BASED_OUTPATIENT_CLINIC_OR_DEPARTMENT_OTHER): Payer: Medicare Other | Admitting: Hematology & Oncology

## 2011-06-11 DIAGNOSIS — C240 Malignant neoplasm of extrahepatic bile duct: Secondary | ICD-10-CM

## 2011-06-11 DIAGNOSIS — Z5111 Encounter for antineoplastic chemotherapy: Secondary | ICD-10-CM

## 2011-06-12 LAB — CROSSMATCH
ABO/RH(D): A POS
Antibody Screen: NEGATIVE
Unit division: 0

## 2011-06-14 ENCOUNTER — Ambulatory Visit (INDEPENDENT_AMBULATORY_CARE_PROVIDER_SITE_OTHER): Payer: Medicare Other | Admitting: Internal Medicine

## 2011-06-14 ENCOUNTER — Encounter: Payer: Self-pay | Admitting: Internal Medicine

## 2011-06-14 DIAGNOSIS — C7800 Secondary malignant neoplasm of unspecified lung: Secondary | ICD-10-CM | POA: Insufficient documentation

## 2011-06-14 DIAGNOSIS — F329 Major depressive disorder, single episode, unspecified: Secondary | ICD-10-CM

## 2011-06-14 DIAGNOSIS — I7389 Other specified peripheral vascular diseases: Secondary | ICD-10-CM

## 2011-06-14 DIAGNOSIS — C249 Malignant neoplasm of biliary tract, unspecified: Secondary | ICD-10-CM

## 2011-06-14 DIAGNOSIS — E785 Hyperlipidemia, unspecified: Secondary | ICD-10-CM

## 2011-06-14 DIAGNOSIS — C221 Intrahepatic bile duct carcinoma: Secondary | ICD-10-CM

## 2011-06-14 DIAGNOSIS — I10 Essential (primary) hypertension: Secondary | ICD-10-CM

## 2011-06-14 DIAGNOSIS — K219 Gastro-esophageal reflux disease without esophagitis: Secondary | ICD-10-CM

## 2011-06-14 HISTORY — DX: Intrahepatic bile duct carcinoma: C22.1

## 2011-06-14 HISTORY — DX: Secondary malignant neoplasm of unspecified lung: C78.00

## 2011-06-14 MED ORDER — GABAPENTIN 300 MG PO CAPS: 600.0000 mg | ORAL_CAPSULE | Freq: Three times a day (TID) | ORAL | Status: DC

## 2011-06-14 MED ORDER — METOPROLOL SUCCINATE ER 100 MG PO TB24: 100.0000 mg | ORAL_TABLET | Freq: Every day | ORAL | Status: DC

## 2011-06-14 MED ORDER — NITROGLYCERIN 0.4 MG SL SUBL: 0.4000 mg | SUBLINGUAL_TABLET | SUBLINGUAL | Status: DC | PRN

## 2011-06-14 MED ORDER — SIMVASTATIN 40 MG PO TABS: 40.0000 mg | ORAL_TABLET | Freq: Every day | ORAL | Status: DC

## 2011-06-14 MED ORDER — OMEPRAZOLE 20 MG PO TBEC: 1.0000 | DELAYED_RELEASE_TABLET | Freq: Two times a day (BID) | ORAL | Status: DC

## 2011-06-14 NOTE — Assessment & Plan Note (Signed)
Patient is currently unmedicated, and is keeping her spirits up. She is still adjusting to the new diagnosis of cancer. In addition she has a family member with cancer who is currently dying of cancer. She states that that's been hard but it's been good to have her family all around her. We will continue to reassess closely.

## 2011-06-14 NOTE — Assessment & Plan Note (Signed)
Patient had been taking HCTZ 25 mg, amlodipine 10 mg, metoprolol 100 mg 24 hour tablets in the past. She is currently only on metoprolol. Her blood pressure is fairly well controlled today, 145/97. If it does continue to be mildly elevated at next visit we will restart one of her hypertension medications.

## 2011-06-14 NOTE — Progress Notes (Signed)
Subjective:    Patient ID: [name], female    DOB: [birth year], 60 y.o.   MRN: [id number]  HPI: The patient is a 60 year old female who comes into the office for a followup visit and to meet her new doctor, Dr. Dorise Hiss. 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 It was stage II when they found that. She did have part of her liver removed to remove the cancer. She then got about 20-30 sessions of radiation after the surgery. This was followed by chemotherapy. She's had multiple rounds of chemotherapy thus far and is currently receiving cis-platinum and Gemzar. She is doing fairly well with the chemotherapy, she is having fatigue and some nausea. She is currently using OxyIR for pain as she was unable to swallow the larger Vicodin pills. She also has Phenergan for nausea and this does seem mostly to control the pain. She does also take dexamethasone in related to her chemotherapy. She takes 2 tabs a day around the treatment and then 1 tab for 2 additional days. She states that she is keeping her spirits up, and has a lot of family in the area. She does have an additional life stressor in that one of her sister's grandchildren is currently dying of cancer. 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 She states that she's not having as many problems with reflux right now however she's having so much nausea that is kind of hard to say. She does continue to take her omeprazole daily. She states that she did stop smoking approximately 12 years ago and I did advise her to stay away from cigarettes in the future. She also has hypertension and is currently only treated with metoprolol XL. She was on 2 additional medications which were stopped around the time of her diagnosis. She states that she did receive 2 units of blood with his past chemotherapy on Monday as she was having low counts and was very fatigued. Otherwise she has no acute complaints at today's visit and is feeling fairly  well today.    Review of Systems  Constitutional: Positive for activity change, appetite change and fatigue. Negative for fever, chills, diaphoresis and unexpected weight change.  HENT: Negative.   Eyes: Negative.   Respiratory: Negative for cough, choking, chest tightness and shortness of breath.   Cardiovascular: Negative for chest pain, palpitations and leg swelling.  Gastrointestinal: Positive for nausea. Negative for vomiting, abdominal pain, diarrhea, constipation and abdominal distention.  Musculoskeletal: Positive for gait problem. Negative for myalgias, back pain, joint swelling and arthralgias.       Due to fatigue from chemo pt is ambulating using a cane or a wheelchair depending on energy level.   Skin: Negative.   Neurological: Negative for dizziness, tremors, seizures, syncope, facial asymmetry, speech difficulty, weakness, light-headedness, numbness and headaches.  Hematological: Negative.   Psychiatric/Behavioral: Negative for confusion, decreased concentration and agitation.       Pt having occasional mood swings but still dealing with diagnosis and family health issues. Keeping up her spirits.   Vitals: Blood pressure: 158/94 Pulse: 56      Objective:   Physical Exam  Constitutional: She is oriented to person, place, and time. She appears well-developed and well-nourished.  HENT:  Head: Normocephalic and atraumatic.  Eyes: EOM are normal. Pupils are equal, round, and reactive to light.  Neck: Normal range of motion. Neck supple.  Cardiovascular: Normal rate and regular rhythm.   Pulmonary/Chest: Effort normal and  breath sounds normal. No respiratory distress.  Abdominal: Soft. Bowel sounds are normal. She exhibits no distension. There is no tenderness. There is no rebound.  Musculoskeletal: Normal range of motion.  Neurological: She is alert and oriented to person, place, and time.  Skin: Skin is warm and dry.  Psychiatric: She has a normal mood and affect. Her  behavior is normal. Judgment and thought content normal.      Assessment & Plan:  1. Please see problem-oriented charting.  2. Disposition-patient was seen back in 3 months. No changes made to her medications at today's visit. Would consider adding back one additional blood pressure medication at next visit if her blood pressure is still slightly elevated. She has been on HCTZ and amlodipine in the past. She will followup with her oncologist the beginning of December. She is continuing to have chemotherapy with cisplatin and Gemzar. I did encourage her to stay away from cigarettes and she did agree. In addition she is continuing to followup with vascular surgeon about the obstructions in both of her legs. They're stable at this time and have been for an amount of time. If at some point in the future they become unstable she may need bypass grafting in both legs. We will put off a Pap smear at today's visit and plan to do it in about 6 months or so.

## 2011-06-14 NOTE — Assessment & Plan Note (Signed)
Patient continues to take simvastatin daily.

## 2011-06-14 NOTE — Assessment & Plan Note (Addendum)
Patient has seen her doctor in vascular surgery recently. He is told her that there are blockages in both of her legs. However they are stable at this time and he would not like to intervene currently while she is undergoing cancer treatment. If at some point in the future they do DD stabilize and cause decreased blood flow to the legs at that point she may require bypass. Currently she is not having a lot of claudication symptoms. She does walk with a cane. She does occasionally require a wheelchair. It depends on her energy level given her chemotherapy. She is taking a baby aspirin daily.

## 2011-06-14 NOTE — Patient Instructions (Signed)
You were seen today for a check up and to meet your new doctor Dr. Dorise Hiss. We are not changing any of your medicines today. We will send refills to the pharmacy for you. If you are having any problems or have any questions please feel free to call our office. Our number is [phone]. We will see you back in 3 months to see how things are going.

## 2011-06-14 NOTE — Assessment & Plan Note (Signed)
Patient was diagnosed in February of 2012. She did have surgery partial hepatectomy in March of 2012 at [facility]. This was followed by her each and approximately 20-30 sessions. She also underwent chemotherapy at that conclusion. She is still undergoing chemotherapy with cis-platinum and Gemzar. She is seeing the [facility] doctors for her cancer treatment. She will followup with them as scheduled in early December. She does continue to undergo chemotherapy. She is having some nausea and weakness. She did receive 2 units of blood at past chemotherapy session due to low blood counts. She states that the Phenergan  does control the nausea most times. She is also getting dexamethasone 4 mg tablet 2 tablets around chemotherapy for 2 days and then one tablet for 2 days after each session. We'll continue to follow the progression however the patient states that the chemotherapy does appear to be helping per her oncologist. We will put off her Pap smear until she is out of this acute phase illness.

## 2011-06-14 NOTE — Assessment & Plan Note (Signed)
Patient does still have occasional GERD symptoms, she is taking omeprazole daily. No change to regimen at this time.

## 2011-06-17 NOTE — Progress Notes (Signed)
Dr. Dorise Hiss discussed patient with me. I agree with her assessment and plan.

## 2011-07-01 ENCOUNTER — Other Ambulatory Visit: Payer: Self-pay | Admitting: Hematology & Oncology

## 2011-07-01 DIAGNOSIS — C249 Malignant neoplasm of biliary tract, unspecified: Secondary | ICD-10-CM

## 2011-07-01 DIAGNOSIS — K264 Chronic or unspecified duodenal ulcer with hemorrhage: Secondary | ICD-10-CM

## 2011-07-02 ENCOUNTER — Encounter: Payer: Medicare Other | Admitting: Family

## 2011-07-03 ENCOUNTER — Ambulatory Visit (HOSPITAL_BASED_OUTPATIENT_CLINIC_OR_DEPARTMENT_OTHER): Payer: Medicare Other | Admitting: Family

## 2011-07-03 ENCOUNTER — Other Ambulatory Visit: Payer: Self-pay | Admitting: Hematology & Oncology

## 2011-07-03 ENCOUNTER — Ambulatory Visit (HOSPITAL_COMMUNITY)
Admission: RE | Admit: 2011-07-03 | Discharge: 2011-07-03 | Disposition: A | Discharge status: Home / Self Care | Payer: Medicare Other | Source: Ambulatory Visit | Attending: Hematology & Oncology | Admitting: Hematology & Oncology

## 2011-07-03 ENCOUNTER — Other Ambulatory Visit (HOSPITAL_BASED_OUTPATIENT_CLINIC_OR_DEPARTMENT_OTHER): Payer: Medicare Other | Admitting: Lab

## 2011-07-03 VITALS — BP 152/86 | HR 67 | Temp 97.0°F | Resp 16 | Ht 63.5 in | Wt 170.0 lb

## 2011-07-03 DIAGNOSIS — D62 Acute posthemorrhagic anemia: Secondary | ICD-10-CM

## 2011-07-03 DIAGNOSIS — C240 Malignant neoplasm of extrahepatic bile duct: Secondary | ICD-10-CM

## 2011-07-03 LAB — CBC WITH DIFFERENTIAL (CANCER CENTER ONLY)
BASO%: 0.2 % (ref 0.0–2.0)
Eosinophils Absolute: 0.1 10*3/uL (ref 0.0–0.5)
MONO#: 0.8 10*3/uL (ref 0.1–0.9)
MONO%: 17.9 % — ABNORMAL HIGH (ref 0.0–13.0)
NEUT#: 2.8 10*3/uL (ref 1.5–6.5)
Platelets: 169 10*3/uL (ref 145–400)
RBC: 2.4 10*6/uL — ABNORMAL LOW (ref 3.70–5.32)
RDW: 18.6 % — ABNORMAL HIGH (ref 11.1–15.7)
WBC: 4.5 10*3/uL (ref 3.9–10.0)

## 2011-07-03 LAB — TECHNOLOGIST REVIEW CHCC SATELLITE

## 2011-07-03 NOTE — Progress Notes (Signed)
DIAGNOSIS: Patient Active Problem List  Diagnoses Date Noted  . Anemia associated with acute blood loss 07/03/2011  . Cancer, biliary, other site 06/14/2011  . ROTATOR CUFF SYNDROME 10/20/2008  . DEPRESSION 09/08/2006  . 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 HYPERLIPIDEMIA 05/26/2006  . HYPERTENSION 05/26/2006  . GERD 05/26/2006     Encounter Diagnosis  Name Primary?  Marland Kitchen Anemia associated with acute blood loss Yes    CURRENT THERAPY: Cisplatin/gemcitabine. Last treatment 10/29.  INTERIM HISTORY: Pt here for earlier than scheduled follow-up. Dtr called the office Monday to report black tarry stools. Instructed her to present for assessment, she presents today.  Increasing fatigue and dyspnea on exertion. Has noticed black tarry stools, no frank blood.  Transient abdominal pain, concentrated under the incision, upper mid abdomen. Bowel movements regular.   PHYSICAL EXAM: BP 123/74  Pulse 64  Temp(Src) 98.3 F (36.8 C) (Oral)  Ht 5' 3.5" (1.613 m)  Wt 170 lb (77.111 kg)  BMI 29.64 kg/m2 General: Ill appearing black female, in no acute distress.  EENT: No ocular or oral lesions. No stomatitis.  Respiratory: Lungs are clear to auscultation bilaterally with normal respiratory movement and no accessory muscle use. Cardiac: No murmur, rub or tachycardia. No upper or lower extremity edema.  GI: Abdomen is soft, no palpable hepatosplenomegaly. No fluid wave. Tenderness to palpation, epigastrum. Rectal exam reveals no frank blood on inspection. Rectal exam, minimal blood on the gloved digit.  Musculoskeletal: No kyphosis, no tenderness over the spine, ribs or hips. Lymph: No cervical, infraclavicular, axillary or inguinal adenopathy. Neuro: No focal neurological deficits. Psych: Alert and oriented X 3, flat affect, subdued mood.   LABORATORY STUDIES:   Results for orders placed in visit on 07/03/11  CBC WITH DIFFERENTIAL ([facility])      Component Value  Range   WBC 4.5  3.9 - 10.0 (10e3/uL)   RBC 2.40 (*) 3.70 - 5.32 (10e6/uL)   HGB 7.5 (*) 11.6 - 15.9 (g/dL)   HCT 16.1 (*) 09.6 - 46.6 (%)   MCV 96  81 - 101 (fL)   MCH 31.3  26.0 - 34.0 (pg)   MCHC 32.5  32.0 - 36.0 (g/dL)   RDW 04.5 (*) 40.9 - 15.7 (%)   Platelets 169  145 - 400 (10e3/uL)   NEUT# 2.8  1.5 - 6.5 (10e3/uL)   LYMPH# 0.8 (*) 0.9 - 3.3 (10e3/uL)   MONO# 0.8  0.1 - 0.9 (10e3/uL)   Eosinophils Absolute 0.1  0.0 - 0.5 (10e3/uL)   BASO# 0.0  0.0 - 0.2 (10e3/uL)   NEUT% 62.1  39.6 - 80.0 (%)   LYMPH% 18.3  14.0 - 48.0 (%)   MONO% 17.9 (*) 0.0 - 13.0 (%)   EOS% 1.5  0.0 - 7.0 (%)   BASO% 0.2  0.0 - 2.0 (%)  TECHNOLOGIST REVIEW [facility]      Component Value Range   Tech Review Rare metamyelocyte      IMPRESSION:  Anemia due to acute blood loss. Hemoccult positive stool X 3.   PLAN:  1. Transfuse 2 u PRBC's Friday Nov. 23. 2. Referral to Dr. Marina Goodell to elucidate source of bleeding.   DISCUSSION: I instructed her to minimally exert herself over the holiday and present to the ER with any cardiac symptoms.

## 2011-07-03 NOTE — Progress Notes (Signed)
A user error has taken place: encounter opened in error, closed for administrative reasons.

## 2011-07-05 ENCOUNTER — Telehealth: Payer: Self-pay | Admitting: *Deleted

## 2011-07-05 ENCOUNTER — Other Ambulatory Visit: Payer: Self-pay | Admitting: Family

## 2011-07-05 ENCOUNTER — Ambulatory Visit: Payer: Medicare Other | Admitting: Hematology & Oncology

## 2011-07-05 ENCOUNTER — Other Ambulatory Visit: Payer: Medicare Other | Admitting: Lab

## 2011-07-05 ENCOUNTER — Encounter: Payer: Self-pay | Admitting: Family

## 2011-07-05 ENCOUNTER — Ambulatory Visit (HOSPITAL_BASED_OUTPATIENT_CLINIC_OR_DEPARTMENT_OTHER): Payer: Medicare Other

## 2011-07-05 DIAGNOSIS — C240 Malignant neoplasm of extrahepatic bile duct: Secondary | ICD-10-CM

## 2011-07-05 DIAGNOSIS — D62 Acute posthemorrhagic anemia: Secondary | ICD-10-CM

## 2011-07-05 MED ORDER — DIPHENHYDRAMINE HCL 50 MG/ML IJ SOLN: 25.0000 mg | Freq: Once | INTRAMUSCULAR | Status: DC

## 2011-07-05 MED ORDER — ACETAMINOPHEN 325 MG PO TABS: 650.0000 mg | ORAL_TABLET | Freq: Once | ORAL | Status: DC

## 2011-07-05 NOTE — Progress Notes (Signed)
Addended by: Theotis Barrio on: 07/05/2011 08:27 AM   Modules accepted: Orders

## 2011-07-05 NOTE — Telephone Encounter (Signed)
I Am trying to reach Dr. Lamar Sprinkles office [facility]. I keep getting the answering service. [name] aware it may be Monday before I can get the referrral

## 2011-07-05 NOTE — Progress Notes (Signed)
Addended by: Anselm Jungling on: 07/05/2011 09:50 AM   Modules accepted: Orders

## 2011-07-06 LAB — TYPE AND SCREEN
ABO/RH(D): A POS
Unit division: 0

## 2011-07-08 ENCOUNTER — Ambulatory Visit (INDEPENDENT_AMBULATORY_CARE_PROVIDER_SITE_OTHER): Payer: Medicare Other | Admitting: Internal Medicine

## 2011-07-08 ENCOUNTER — Other Ambulatory Visit: Payer: Self-pay | Admitting: Internal Medicine

## 2011-07-08 ENCOUNTER — Telehealth: Payer: Self-pay | Admitting: *Deleted

## 2011-07-08 ENCOUNTER — Encounter: Payer: Self-pay | Admitting: Internal Medicine

## 2011-07-08 ENCOUNTER — Other Ambulatory Visit (INDEPENDENT_AMBULATORY_CARE_PROVIDER_SITE_OTHER): Payer: Medicare Other

## 2011-07-08 ENCOUNTER — Telehealth: Payer: Self-pay | Admitting: Internal Medicine

## 2011-07-08 VITALS — BP 118/68 | HR 68 | Ht 62.0 in | Wt 179.8 lb

## 2011-07-08 DIAGNOSIS — C221 Intrahepatic bile duct carcinoma: Secondary | ICD-10-CM

## 2011-07-08 DIAGNOSIS — K921 Melena: Secondary | ICD-10-CM

## 2011-07-08 DIAGNOSIS — R109 Unspecified abdominal pain: Secondary | ICD-10-CM

## 2011-07-08 DIAGNOSIS — D649 Anemia, unspecified: Secondary | ICD-10-CM

## 2011-07-08 DIAGNOSIS — R1010 Upper abdominal pain, unspecified: Secondary | ICD-10-CM

## 2011-07-08 DIAGNOSIS — D62 Acute posthemorrhagic anemia: Secondary | ICD-10-CM

## 2011-07-08 LAB — CBC WITH DIFFERENTIAL/PLATELET
Basophils Relative: 0.2 % (ref 0.0–3.0)
Eosinophils Absolute: 0 10*3/uL (ref 0.0–0.7)
Lymphocytes Relative: 15.4 % (ref 12.0–46.0)
MCHC: 33.5 g/dL (ref 30.0–36.0)
Neutrophils Relative %: 65.2 % (ref 43.0–77.0)
RBC: 2.87 Mil/uL — ABNORMAL LOW (ref 3.87–5.11)
WBC: 3.9 10*3/uL — ABNORMAL LOW (ref 4.5–10.5)

## 2011-07-08 NOTE — Progress Notes (Signed)
Subjective:    Patient ID: [name], female    DOB: 1951/07/10, 60 y.o.   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 The patient states that approximately 2 weeks ago she developed loose black/tarry diarrhea. This was followed by further "very dark stools" which have persisted ever since. She notes that her stools today were dark brown and formed. She was seen by her primary care team along with her oncology team and her hemoglobin was noted to be significantly decreased to approximately 7.5. Her stool is heme positive. She received 2 units of packed red blood cells on 07/03/2011.  Prior to the transfusion she had noted fatigue, dyspnea on exertion. These symptoms improved after her transfusion. She also reports several weeks to a few months of upper abdominal pain. This initially wasn't intermittent pain, but has become a more daily pain for her. She describes the pain as a "pulling" sensation and is occasionally sharp. This is worse with eating and associated with nausea. She also reports significant early satiety. She denies vomiting. She denies hematemesis. No fevers or chills. She does remain on omeprazole 20 mg twice daily. No fevers or chills. Denies heartburn, dysphagia and odynophagia.  Her last chemotherapy was 06/10/2011.  Her daughter who accompanied her today provide some of the additional history. She knows of the patient has had a total of 4 units of packed red cells over the last approximate 6 weeks. She had 2 units around the time of her last chemotherapy, and then the last 2 units were November 21.   Review of Systems Constitutional: Negative for fever, chills, night sweats, see HPI HEENT: Negative for sore throat, mouth sores and trouble  swallowing. Eyes: Negative for visual disturbance Respiratory: Negative for cough, chest tightness and shortness of breath (see HPI) Cardiovascular: Negative for chest pain, palpitations and lower extremity swelling Gastrointestinal: See history of present illness Genitourinary: Negative for dysuria and hematuria. Musculoskeletal: Negative for back pain, arthralgias and myalgias Skin: Negative for rash or color change Neurological: Negative for headaches, weakness, numbness Hematological: Negative for adenopathy, negative for easy bruising/bleeding Psychiatric/behavioral: Negative for depressed mood, negative for anxiety   Past Medical History  Diagnosis Date  . Hypertension   . Hyperlipidemia   . GERD (gastroesophageal reflux disease)   . Depression   . Esophageal stricture 2003     [place] [facility] esophageal dilation in 2003 performed by Dr. Marina Goodell, second esophageal dilation performed in 2006  by Dr. Marina Goodell  . Obstructive jaundice due to malignant neoplasm  February 2012     status post ERCP done by Dr. Marina Goodell with biliary sphincterotomy, bile duct stricture brushings, biliary stent placement, performed secondary to obstructive jaundice, malignant-appearing bile duct stricture of the common hepatic duct worrisome for primary cholangiocarcinoma, status post ERCP with biliary sphincterotomy and biliary stent placement...status post biopsy -  markedly atypical cells cons  . Rotator cuff syndrome 2010     per MRI March 2010 there is a prominent articular surface partial tear of the supraspinatus which is nearly full thickness, as well as a tear of the distal anterior supraspinatus which probably represents a full-thickness partial tear,  with moderate supraspinatus tendinopathy, moderate infraspinatus tendinopathy and prominent undersurface tear of the acromion along the coracoacromial ligament   Current Outpatient Prescriptions  Medication Sig  Dispense Refill  . aspirin 81 MG EC tablet Take 81  mg by mouth daily.        Marland Kitchen dexamethasone (DECADRON) 4 MG tablet Take 4 mg by mouth daily with breakfast. Take 2 tabs by mouth days 1 and 3 of chemo cycle, then 1 tab by mouth days 4 and 5 of each cycle of chemo      . gabapentin (NEURONTIN) 300 MG capsule Take 2 capsules (600 mg total) by mouth 3 (three) times daily.  180 capsule  3  . LORazepam (ATIVAN) 0.5 MG tablet Take 0.5 mg by mouth every 8 (eight) hours.        . metoprolol (TOPROL-XL) 100 MG 24 hr tablet Take 1 tablet (100 mg total) by mouth daily.  90 tablet  3  . nitroGLYCERIN (NITROSTAT) 0.4 MG SL tablet Place 1 tablet (0.4 mg total) under the tongue every 5 (five) minutes as needed. Up to 3 times for chest pain  10 tablet  1  . Omeprazole 20 MG TBEC Take 1 tablet (20 mg total) by mouth 2 (two) times daily.  180 each  3  . oxycodone (OXY-IR) 5 MG capsule Take 5 mg by mouth every 4 (four) hours as needed.        . promethazine (PHENERGAN) 12.5 MG tablet Take 12.5 mg by mouth every 6 (six) hours as needed.        . simvastatin (ZOCOR) 40 MG tablet Take 1 tablet (40 mg total) by mouth at bedtime.  31 tablet  1   No current facility-administered medications for this visit.   Facility-Administered Medications Ordered in Other Visits  Medication Dose Route Frequency Provider Last Rate Last Dose  . acetaminophen (TYLENOL) tablet 650 mg  650 mg Oral Once Colman Cater, FNP      . diphenhydrAMINE (BENADRYL) injection 25 mg  25 mg Intravenous Once Colman Cater, FNP       Allergies  Allergen Reactions  . Sulfonamide Derivatives    Family History  Problem Relation Age of Onset  . Heart disease Brother    SH - reviewed and no change    Objective:   Physical Exam BP 118/68  Pulse 68  Ht 5\' 2"  (1.575 m)  Wt 81.557 kg (179 lb 12.8 oz)  BMI 32.89 kg/m2 Constitutional: Well-developed, NAD, chronically ill-appearing HEENT: Normocephalic and atraumatic. Oropharynx is clear and moist. No oropharyngeal exudate. Conjunctivae are pale.  Pupils are equal round and reactive to light. No scleral icterus. Neck: Neck supple. Trachea midline. Cardiovascular: Normal rate, regular rhythm and intact distal pulses. Soft 2/6 systolic ejection murmur Pulmonary/chest: Effort normal and breath sounds normal. No wheezing, rales or rhonchi. Abdominal: Soft, epigastric and upper abdominal tenderness to deep palpation without rebound or guarding, well-healed upper abdominal scar consistent with prior liver/biliary resection, nondistended. Bowel sounds active throughout.  No hepatosplenomegaly. Extremities: no clubbing, cyanosis, or edema Lymphadenopathy: No cervical adenopathy noted. Neurological: Alert and oriented to person place and time. Skin: Skin is warm and dry. No rashes noted. Psychiatric: Normal mood and affect. Behavior is normal.  CBC    Component Value Date/Time   WBC 3.9* 07/08/2011 1331   WBC 4.5 07/03/2011 1421   RBC 2.87* 07/08/2011 1331   HGB 9.0* 07/08/2011 1331   HGB 7.5* 07/03/2011 1421   HCT 27.0* 07/08/2011 1331   HCT 23.1* 07/03/2011 1421   PLT 110.0* 07/08/2011 1331   PLT 169 07/03/2011 1421   MCV 93.8 07/08/2011 1331   MCV 96 07/03/2011 1421  MCH 29.3 11/11/2010 2120   MCHC 33.5 07/08/2011 1331   RDW 19.5* 07/08/2011 1331   LYMPHSABS 0.6* 07/08/2011 1331   MONOABS 0.7 07/08/2011 1331   EOSABS 0.0 07/08/2011 1331   EOSABS 0.1 07/03/2011 1421   BASOSABS 0.0 07/08/2011 1331   BASOSABS 0.0 07/03/2011 1421   CT chest abdomen and pelvis 04/29/2011:  CT CHEST  Findings: Port in the right anterior chest wall. No evidence of  axillary supraclavicular lymphadenopathy. Mediastinal hilar  adenopathy. No pericardial fluid.  There are multiple bilateral pulmonary nodules of varying sizes  which are not changed in size or number compared to prior. There  is a small right effusion which is also unchanged.   IMPRESSION:  Stable pulmonary metastasis.   CT ABDOMEN AND PELVIS  Findings: There is postsurgical  change in the right hepatic lobe  consistent with partial hepatectomy. No new nodularity at the  surgical site. No evidence of focal lesion in the left hepatic  lobe. No ductal dilatation. The left portal vein and artery are  patent.  Pancreas, spleen, and right adrenal gland, and kidneys are normal.  Nodular enlargement of the left adrenal gland to 2.5 cm is  unchanged and has previously demonstrate washout characteristics of  a benign adenoma.  The stomach, small bowel, and colon are normal.  There is no peritoneal disease. No retroperitoneal  lymphadenopathy.  No free fluid the pelvis. The uterus and bladder are normal. No  pelvic lymphadenopathy. Review of bone windows demonstrates no  aggressive osseous lesions.   IMPRESSION:  1. Stable right hepatectomy. No evidence of local recurrence or  liver metastasis.  2. Stable left adrenal adenoma.  3. No evidence of disease progression.     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 Melena/anemia/need for transfusion -- the patient's melena, need for transfusion, and upper abdominal pain are concerning and warrants further investigation with upper endoscopy. I will discuss this with Dr. Marina Goodell and we'll attempt to have this procedure done at the hospital this week. I do not see she needs urgent admission at present, as her hemoglobin improved from 7.5-9.0 with 2 units of packed red blood cells. It does not seem that she is having a brisk GI bleed at present, but yet this warrants urgent evaluation. She will continue twice daily omeprazole for acid suppression. I have advised that should she develop recurrent loose black stool, increasing weakness/fatigue, dyspnea on exertion, chest pain or palpitations that she care immediately  in the emergency department.  She and her daughter voiced understanding.  Further plans can be made after her upper endoscopy.

## 2011-07-08 NOTE — Patient Instructions (Signed)
You will be contacted with a date for your endoscopy procedure.

## 2011-07-08 NOTE — Telephone Encounter (Signed)
Pt scheduled for EGD with Dr. Marina Goodell Wed. 07/10/11 at [facility]. Pt to arrive at 7am, EGD scheduled for 8am. Pt aware of appt date and time. Reviewed prep instructions with pt. Scheduled with Noreene Larsson, booking number [phone].

## 2011-07-08 NOTE — Telephone Encounter (Signed)
Alfonso from Dr. Lamar Sprinkles office called said Pt couldn't get in until 12-4 I told her it had to be today she said she would call me back. I told her [place] knows that.

## 2011-07-08 NOTE — Telephone Encounter (Signed)
[name] pt last seen 06/22/10. Pt has history of biliary cancer and is being treated by Dr. Myna Hidalgo with Cisplatin and Gemzar. Last chemo 06-10-11. Pt seen last week by NP in [place] for black tarry stools on 07-03-11. Pt received 2 units of blood on Friday. Pts Hgb was 7.7 on 06/10/11. Hgb 07/03/11 7.5 Requesting pt be seen today to evaluate and find the source of bleeding. Dr. [name] as doc of the day please advise.

## 2011-07-08 NOTE — Telephone Encounter (Signed)
Appointment is with Dr. Arlyce Dice 11-27 at 330pm. Pt is aware. NP aware of appointment date and time

## 2011-07-08 NOTE — Telephone Encounter (Signed)
Called Dr. Lamar Sprinkles office talked with [place] they are aware we want Pt seen today she said she would have a Nurse call me back.. Pt's family has called this morning also, they are aware I will call them back.

## 2011-07-08 NOTE — Telephone Encounter (Signed)
[name] with Dr. Marina Goodell left message to please call with status. NP aware

## 2011-07-08 NOTE — Telephone Encounter (Signed)
Spoke with Dr. [name] as he is doc of the day. Pt is being scheduled to see Dr. Arlyce Dice tomorrow at 3:30pm as Dr. Arlyce Dice is doc of the day tomorrow. Marlou Porch aware and will notify pt of appt.

## 2011-07-09 ENCOUNTER — Encounter (HOSPITAL_COMMUNITY): Payer: Self-pay | Admitting: *Deleted

## 2011-07-09 ENCOUNTER — Ambulatory Visit: Payer: Medicare Other | Admitting: Gastroenterology

## 2011-07-10 ENCOUNTER — Encounter (HOSPITAL_COMMUNITY)
Admission: RE | Disposition: A | Discharge status: Home / Self Care | Payer: Self-pay | Source: Ambulatory Visit | Attending: Internal Medicine

## 2011-07-10 ENCOUNTER — Encounter (HOSPITAL_COMMUNITY): Payer: Self-pay | Admitting: Internal Medicine

## 2011-07-10 ENCOUNTER — Telehealth: Payer: Self-pay | Admitting: *Deleted

## 2011-07-10 ENCOUNTER — Other Ambulatory Visit: Payer: Self-pay | Admitting: Family

## 2011-07-10 ENCOUNTER — Ambulatory Visit (HOSPITAL_COMMUNITY)
Admission: RE | Admit: 2011-07-10 | Discharge: 2011-07-10 | Disposition: A | Discharge status: Home / Self Care | Payer: Medicare Other | Source: Ambulatory Visit | Attending: Internal Medicine | Admitting: Internal Medicine

## 2011-07-10 DIAGNOSIS — K31811 Angiodysplasia of stomach and duodenum with bleeding: Secondary | ICD-10-CM

## 2011-07-10 DIAGNOSIS — I10 Essential (primary) hypertension: Secondary | ICD-10-CM | POA: Insufficient documentation

## 2011-07-10 DIAGNOSIS — K921 Melena: Secondary | ICD-10-CM | POA: Insufficient documentation

## 2011-07-10 DIAGNOSIS — Z7982 Long term (current) use of aspirin: Secondary | ICD-10-CM | POA: Insufficient documentation

## 2011-07-10 DIAGNOSIS — Z8509 Personal history of malignant neoplasm of other digestive organs: Secondary | ICD-10-CM | POA: Insufficient documentation

## 2011-07-10 DIAGNOSIS — K219 Gastro-esophageal reflux disease without esophagitis: Secondary | ICD-10-CM | POA: Insufficient documentation

## 2011-07-10 DIAGNOSIS — Y842 Radiological procedure and radiotherapy as the cause of abnormal reaction of the patient, or of later complication, without mention of misadventure at the time of the procedure: Secondary | ICD-10-CM | POA: Insufficient documentation

## 2011-07-10 DIAGNOSIS — D62 Acute posthemorrhagic anemia: Secondary | ICD-10-CM

## 2011-07-10 DIAGNOSIS — Z79899 Other long term (current) drug therapy: Secondary | ICD-10-CM | POA: Insufficient documentation

## 2011-07-10 DIAGNOSIS — D500 Iron deficiency anemia secondary to blood loss (chronic): Secondary | ICD-10-CM

## 2011-07-10 DIAGNOSIS — K2980 Duodenitis without bleeding: Secondary | ICD-10-CM | POA: Insufficient documentation

## 2011-07-10 DIAGNOSIS — R109 Unspecified abdominal pain: Secondary | ICD-10-CM | POA: Insufficient documentation

## 2011-07-10 DIAGNOSIS — K2970 Gastritis, unspecified, without bleeding: Secondary | ICD-10-CM | POA: Insufficient documentation

## 2011-07-10 DIAGNOSIS — E785 Hyperlipidemia, unspecified: Secondary | ICD-10-CM | POA: Insufficient documentation

## 2011-07-10 HISTORY — PX: ESOPHAGOGASTRODUODENOSCOPY: SHX5428

## 2011-07-10 HISTORY — DX: Shortness of breath: R06.02

## 2011-07-10 HISTORY — DX: Diaphragmatic hernia without obstruction or gangrene: K44.9

## 2011-07-10 HISTORY — DX: Malignant neoplasm of extrahepatic bile duct: C24.0

## 2011-07-10 SURGERY — EGD (ESOPHAGOGASTRODUODENOSCOPY)
Anesthesia: Moderate Sedation

## 2011-07-10 MED ORDER — SODIUM CHLORIDE 0.9 % IV SOLN
INTRAVENOUS | Status: DC
  Administered 2011-07-10: 20 mL via INTRAVENOUS

## 2011-07-10 MED ORDER — BUTAMBEN-TETRACAINE-BENZOCAINE 2-2-14 % EX AERO
INHALATION_SPRAY | CUTANEOUS | Status: DC | PRN
  Administered 2011-07-10: 2 via TOPICAL

## 2011-07-10 MED ORDER — MIDAZOLAM HCL 10 MG/2ML IJ SOLN
INTRAMUSCULAR | Status: DC | PRN
  Administered 2011-07-10: 2 mg via INTRAVENOUS
  Administered 2011-07-10 (×2): 1 mg via INTRAVENOUS
  Administered 2011-07-10: 2 mg via INTRAVENOUS
  Administered 2011-07-10 (×2): 1 mg via INTRAVENOUS
  Administered 2011-07-10: 2 mg via INTRAVENOUS

## 2011-07-10 MED ORDER — FENTANYL CITRATE 0.05 MG/ML IJ SOLN
INTRAMUSCULAR | Status: AC
  Filled 2011-07-10: qty 2

## 2011-07-10 MED ORDER — FENTANYL NICU IV SYRINGE 50 MCG/ML
INJECTION | INTRAMUSCULAR | Status: DC | PRN
  Administered 2011-07-10 (×4): 25 ug via INTRAVENOUS

## 2011-07-10 MED ORDER — MIDAZOLAM HCL 10 MG/2ML IJ SOLN
INTRAMUSCULAR | Status: AC
  Filled 2011-07-10: qty 2

## 2011-07-10 NOTE — Brief Op Note (Signed)
PLEASE SEE [facility] REPORT

## 2011-07-10 NOTE — Telephone Encounter (Signed)
Left pt message to call and schedule appointments. ( she needs iron infusion)

## 2011-07-10 NOTE — Interval H&P Note (Signed)
History and Physical Interval Note:   07/10/2011   8:34 AM   [name]  has presented today for surgery, with the diagnosis of gib/anemia  The various methods of treatment have been discussed with the patient and family. After consideration of risks, benefits and other options for treatment, the patient has consented to  Procedure(s): ESOPHAGOGASTRODUODENOSCOPY (EGD) as a surgical intervention .  The patients' history has been reviewed, patient examined, no change in status, stable for surgery.  I have reviewed the patients' chart and labs.  Questions were answered to the patient's satisfaction.     Yancey Flemings  MD No change from above (reviewed, pt re-examined)

## 2011-07-10 NOTE — Telephone Encounter (Signed)
Pt made 11-29 iron appointment

## 2011-07-10 NOTE — H&P (View-Only) (Signed)
Subjective:    Patient ID: [name], female    DOB: 11/17/1950, 60 y.o.   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 The patient states that approximately 2 weeks ago she developed loose black/tarry diarrhea. This was followed by further "very dark stools" which have persisted ever since. She notes that her stools today were dark brown and formed. She was seen by her primary care team along with her oncology team and her hemoglobin was noted to be significantly decreased to approximately 7.5. Her stool is heme positive. She received 2 units of packed red blood cells on 07/03/2011.  Prior to the transfusion she had noted fatigue, dyspnea on exertion. These symptoms improved after her transfusion. She also reports several weeks to a few months of upper abdominal pain. This initially wasn't intermittent pain, but has become a more daily pain for her. She describes the pain as a "pulling" sensation and is occasionally sharp. This is worse with eating and associated with nausea. She also reports significant early satiety. She denies vomiting. She denies hematemesis. No fevers or chills. She does remain on omeprazole 20 mg twice daily. No fevers or chills. Denies heartburn, dysphagia and odynophagia.  Her last chemotherapy was 06/10/2011.  Her daughter who accompanied her today provide some of the additional history. She knows of the patient has had a total of 4 units of packed red cells over the last approximate 6 weeks. She had 2 units around the time of her last chemotherapy, and then the last 2 units were November 21.   Review of Systems Constitutional: Negative for fever, chills, night sweats, see HPI HEENT: Negative for sore throat, mouth sores and trouble  swallowing. Eyes: Negative for visual disturbance Respiratory: Negative for cough, chest tightness and shortness of breath (see HPI) Cardiovascular: Negative for chest pain, palpitations and lower extremity swelling Gastrointestinal: See history of present illness Genitourinary: Negative for dysuria and hematuria. Musculoskeletal: Negative for back pain, arthralgias and myalgias Skin: Negative for rash or color change Neurological: Negative for headaches, weakness, numbness Hematological: Negative for adenopathy, negative for easy bruising/bleeding Psychiatric/behavioral: Negative for depressed mood, negative for anxiety   Past Medical History  Diagnosis Date  . Hypertension   . Hyperlipidemia   . GERD (gastroesophageal reflux disease)   . Depression   . Esophageal stricture 2003     [facility] post esophageal dilation in 2003 performed by Dr. Perry, second esophageal dilation performed in 2006  by Dr. Perry  . Obstructive jaundice due to malignant neoplasm  February 2012     status post ERCP done by Dr. Perry with biliary sphincterotomy, bile duct stricture brushings, biliary stent placement, performed secondary to obstructive jaundice, malignant-appearing bile duct stricture of the common hepatic duct worrisome for primary cholangiocarcinoma, status post ERCP with biliary sphincterotomy and biliary stent placement...status post biopsy -  markedly atypical cells cons  . Rotator cuff syndrome 2010     per MRI March 2010 there is a prominent articular surface partial tear of the supraspinatus which is nearly full thickness, as well as a tear of the distal anterior supraspinatus which probably represents a full-thickness partial tear,  with moderate supraspinatus tendinopathy, moderate infraspinatus tendinopathy and prominent undersurface tear of the acromion along the coracoacromial ligament   Current Outpatient Prescriptions  Medication Sig   Dispense Refill  . aspirin 81 MG EC tablet Take 81  mg by mouth daily.        . dexamethasone (DECADRON) 4 MG tablet Take 4 mg by mouth daily with breakfast. Take 2 tabs by mouth days 1 and 3 of chemo cycle, then 1 tab by mouth days 4 and 5 of each cycle of chemo      . gabapentin (NEURONTIN) 300 MG capsule Take 2 capsules (600 mg total) by mouth 3 (three) times daily.  180 capsule  3  . LORazepam (ATIVAN) 0.5 MG tablet Take 0.5 mg by mouth every 8 (eight) hours.        . metoprolol (TOPROL-XL) 100 MG 24 hr tablet Take 1 tablet (100 mg total) by mouth daily.  90 tablet  3  . nitroGLYCERIN (NITROSTAT) 0.4 MG SL tablet Place 1 tablet (0.4 mg total) under the tongue every 5 (five) minutes as needed. Up to 3 times for chest pain  10 tablet  1  . Omeprazole 20 MG TBEC Take 1 tablet (20 mg total) by mouth 2 (two) times daily.  180 each  3  . oxycodone (OXY-IR) 5 MG capsule Take 5 mg by mouth every 4 (four) hours as needed.        . promethazine (PHENERGAN) 12.5 MG tablet Take 12.5 mg by mouth every 6 (six) hours as needed.        . simvastatin (ZOCOR) 40 MG tablet Take 1 tablet (40 mg total) by mouth at bedtime.  31 tablet  1   No current facility-administered medications for this visit.   Facility-Administered Medications Ordered in Other Visits  Medication Dose Route Frequency Provider Last Rate Last Dose  . acetaminophen (TYLENOL) tablet 650 mg  650 mg Oral Once Nancy Rudolph, FNP      . diphenhydrAMINE (BENADRYL) injection 25 mg  25 mg Intravenous Once Nancy Rudolph, FNP       Allergies  Allergen Reactions  . Sulfonamide Derivatives    Family History  Problem Relation Age of Onset  . Heart disease Brother    SH - reviewed and no change    Objective:   Physical Exam BP 118/68  Pulse 68  Ht 5' 2" (1.575 m)  Wt 81.557 kg (179 lb 12.8 oz)  BMI 32.89 kg/m2 Constitutional: Well-developed, NAD, chronically ill-appearing HEENT: Normocephalic and atraumatic. Oropharynx is clear and moist. No oropharyngeal exudate. Conjunctivae are pale.  Pupils are equal round and reactive to light. No scleral icterus. Neck: Neck supple. Trachea midline. Cardiovascular: Normal rate, regular rhythm and intact distal pulses. Soft 2/6 systolic ejection murmur Pulmonary/chest: Effort normal and breath sounds normal. No wheezing, rales or rhonchi. Abdominal: Soft, epigastric and upper abdominal tenderness to deep palpation without rebound or guarding, well-healed upper abdominal scar consistent with prior liver/biliary resection, nondistended. Bowel sounds active throughout.  No hepatosplenomegaly. Extremities: no clubbing, cyanosis, or edema Lymphadenopathy: No cervical adenopathy noted. Neurological: Alert and oriented to person place and time. Skin: Skin is warm and dry. No rashes noted. Psychiatric: Normal mood and affect. Behavior is normal.  CBC    Component Value Date/Time   WBC 3.9* 07/08/2011 1331   WBC 4.5 07/03/2011 1421   RBC 2.87* 07/08/2011 1331   HGB 9.0* 07/08/2011 1331   HGB 7.5* 07/03/2011 1421   HCT 27.0* 07/08/2011 1331   HCT 23.1* 07/03/2011 1421   PLT 110.0* 07/08/2011 1331   PLT 169 07/03/2011 1421   MCV 93.8 07/08/2011 1331   MCV 96 07/03/2011 1421     MCH 29.3 11/11/2010 2120   MCHC 33.5 07/08/2011 1331   RDW 19.5* 07/08/2011 1331   LYMPHSABS 0.6* 07/08/2011 1331   MONOABS 0.7 07/08/2011 1331   EOSABS 0.0 07/08/2011 1331   EOSABS 0.1 07/03/2011 1421   BASOSABS 0.0 07/08/2011 1331   BASOSABS 0.0 07/03/2011 1421   CT chest abdomen and pelvis 04/29/2011:  CT CHEST  Findings: Port in the right anterior chest wall. No evidence of  axillary supraclavicular lymphadenopathy. Mediastinal hilar  adenopathy. No pericardial fluid.  There are multiple bilateral pulmonary nodules of varying sizes  which are not changed in size or number compared to prior. There  is a small right effusion which is also unchanged.   IMPRESSION:  Stable pulmonary metastasis.   CT ABDOMEN AND PELVIS  Findings: There is postsurgical  change in the right hepatic lobe  consistent with partial hepatectomy. No new nodularity at the  surgical site. No evidence of focal lesion in the left hepatic  lobe. No ductal dilatation. The left portal vein and artery are  patent.  Pancreas, spleen, and right adrenal gland, and kidneys are normal.  Nodular enlargement of the left adrenal gland to 2.5 cm is  unchanged and has previously demonstrate washout characteristics of  a benign adenoma.  The stomach, small bowel, and colon are normal.  There is no peritoneal disease. No retroperitoneal  lymphadenopathy.  No free fluid the pelvis. The uterus and bladder are normal. No  pelvic lymphadenopathy. Review of bone windows demonstrates no  aggressive osseous lesions.   IMPRESSION:  1. Stable right hepatectomy. No evidence of local recurrence or  liver metastasis.  2. Stable left adrenal adenoma.  3. No evidence of disease progression.     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 Melena/anemia/need for transfusion -- the patient's melena, need for transfusion, and upper abdominal pain are concerning and warrants further investigation with upper endoscopy. I will discuss this with Dr. Perry and we'll attempt to have this procedure done at the hospital this week. I do not see she needs urgent admission at present, as her hemoglobin improved from 7.5-9.0 with 2 units of packed red blood cells. It does not seem that she is having a brisk GI bleed at present, but yet this warrants urgent evaluation. She will continue twice daily omeprazole for acid suppression. I have advised that should she develop recurrent loose black stool, increasing weakness/fatigue, dyspnea on exertion, chest pain or palpitations that she care immediately  in the emergency department.  She and her daughter voiced understanding.  Further plans can be made after her upper endoscopy. 

## 2011-07-11 ENCOUNTER — Encounter (HOSPITAL_COMMUNITY): Payer: Self-pay

## 2011-07-11 ENCOUNTER — Ambulatory Visit (HOSPITAL_BASED_OUTPATIENT_CLINIC_OR_DEPARTMENT_OTHER): Payer: Medicare Other

## 2011-07-11 ENCOUNTER — Encounter (HOSPITAL_COMMUNITY): Payer: Self-pay | Admitting: Internal Medicine

## 2011-07-11 VITALS — BP 117/79 | HR 62 | Temp 97.7°F

## 2011-07-11 DIAGNOSIS — K922 Gastrointestinal hemorrhage, unspecified: Secondary | ICD-10-CM

## 2011-07-11 DIAGNOSIS — D500 Iron deficiency anemia secondary to blood loss (chronic): Secondary | ICD-10-CM

## 2011-07-11 MED ORDER — SODIUM CHLORIDE 0.9 % IJ SOLN
10.0000 mL | INTRAMUSCULAR | Status: DC | PRN
  Administered 2011-07-11: 10 mL
  Filled 2011-07-11: qty 10

## 2011-07-11 MED ORDER — HEPARIN SOD (PORK) LOCK FLUSH 100 UNIT/ML IV SOLN
500.0000 [IU] | Freq: Once | INTRAVENOUS | Status: AC | PRN
  Administered 2011-07-11: 500 [IU]
  Filled 2011-07-11: qty 5

## 2011-07-11 MED ORDER — SODIUM CHLORIDE 0.9 % IV SOLN
Freq: Once | INTRAVENOUS | Status: AC
  Administered 2011-07-11: 12:00:00 via INTRAVENOUS

## 2011-07-11 MED ORDER — SODIUM CHLORIDE 0.9 % IV SOLN
1020.0000 mg | Freq: Once | INTRAVENOUS | Status: AC
  Administered 2011-07-11: 1020 mg via INTRAVENOUS
  Filled 2011-07-11: qty 34

## 2011-07-11 MED ORDER — SODIUM CHLORIDE 0.9 % IV SOLN: 1020.0000 mg | Freq: Once | INTRAVENOUS | Status: DC

## 2011-07-12 ENCOUNTER — Ambulatory Visit: Payer: Medicare Other

## 2011-07-15 ENCOUNTER — Telehealth: Payer: Self-pay

## 2011-07-15 ENCOUNTER — Other Ambulatory Visit: Payer: Self-pay | Admitting: Internal Medicine

## 2011-07-15 NOTE — Telephone Encounter (Signed)
Pt scheduled for repeat EGD with APC 07/31/11@1pm  at [facility] with Dr. Marina Goodell. Pt to arrive at 12noon. Prep instructions mailed to pt. Scheduled with Noreene Larsson [phone]. Pt aware of appt date and time.

## 2011-07-17 ENCOUNTER — Other Ambulatory Visit: Payer: Medicare Other | Admitting: Lab

## 2011-07-19 ENCOUNTER — Emergency Department (INDEPENDENT_AMBULATORY_CARE_PROVIDER_SITE_OTHER): Payer: Medicare Other

## 2011-07-19 ENCOUNTER — Inpatient Hospital Stay (HOSPITAL_BASED_OUTPATIENT_CLINIC_OR_DEPARTMENT_OTHER)
Admission: EM | Admit: 2011-07-19 | Discharge: 2011-07-24 | DRG: 378 | Disposition: A | Discharge status: Home / Self Care | Payer: Medicare Other | Attending: Internal Medicine | Admitting: Internal Medicine

## 2011-07-19 ENCOUNTER — Ambulatory Visit (HOSPITAL_BASED_OUTPATIENT_CLINIC_OR_DEPARTMENT_OTHER): Payer: Medicare Other | Admitting: Hematology & Oncology

## 2011-07-19 ENCOUNTER — Other Ambulatory Visit: Payer: Self-pay | Admitting: Family

## 2011-07-19 ENCOUNTER — Ambulatory Visit (HOSPITAL_BASED_OUTPATIENT_CLINIC_OR_DEPARTMENT_OTHER): Payer: Medicare Other | Admitting: Lab

## 2011-07-19 ENCOUNTER — Inpatient Hospital Stay (HOSPITAL_COMMUNITY): Payer: Medicare Other

## 2011-07-19 ENCOUNTER — Encounter (HOSPITAL_BASED_OUTPATIENT_CLINIC_OR_DEPARTMENT_OTHER): Payer: Self-pay | Admitting: Family Medicine

## 2011-07-19 ENCOUNTER — Other Ambulatory Visit: Payer: Self-pay

## 2011-07-19 DIAGNOSIS — E785 Hyperlipidemia, unspecified: Secondary | ICD-10-CM

## 2011-07-19 DIAGNOSIS — C799 Secondary malignant neoplasm of unspecified site: Secondary | ICD-10-CM

## 2011-07-19 DIAGNOSIS — J90 Pleural effusion, not elsewhere classified: Secondary | ICD-10-CM

## 2011-07-19 DIAGNOSIS — K922 Gastrointestinal hemorrhage, unspecified: Secondary | ICD-10-CM

## 2011-07-19 DIAGNOSIS — IMO0001 Reserved for inherently not codable concepts without codable children: Secondary | ICD-10-CM

## 2011-07-19 DIAGNOSIS — I2510 Atherosclerotic heart disease of native coronary artery without angina pectoris: Secondary | ICD-10-CM | POA: Diagnosis present

## 2011-07-19 DIAGNOSIS — D62 Acute posthemorrhagic anemia: Secondary | ICD-10-CM

## 2011-07-19 DIAGNOSIS — Z452 Encounter for adjustment and management of vascular access device: Secondary | ICD-10-CM

## 2011-07-19 DIAGNOSIS — D3500 Benign neoplasm of unspecified adrenal gland: Secondary | ICD-10-CM | POA: Diagnosis present

## 2011-07-19 DIAGNOSIS — C221 Intrahepatic bile duct carcinoma: Secondary | ICD-10-CM | POA: Diagnosis present

## 2011-07-19 DIAGNOSIS — K2991 Gastroduodenitis, unspecified, with bleeding: Principal | ICD-10-CM | POA: Diagnosis present

## 2011-07-19 DIAGNOSIS — M67919 Unspecified disorder of synovium and tendon, unspecified shoulder: Secondary | ICD-10-CM

## 2011-07-19 DIAGNOSIS — F4321 Adjustment disorder with depressed mood: Secondary | ICD-10-CM | POA: Diagnosis present

## 2011-07-19 DIAGNOSIS — Y842 Radiological procedure and radiotherapy as the cause of abnormal reaction of the patient, or of later complication, without mention of misadventure at the time of the procedure: Secondary | ICD-10-CM | POA: Diagnosis present

## 2011-07-19 DIAGNOSIS — D500 Iron deficiency anemia secondary to blood loss (chronic): Secondary | ICD-10-CM

## 2011-07-19 DIAGNOSIS — I517 Cardiomegaly: Secondary | ICD-10-CM

## 2011-07-19 DIAGNOSIS — R0989 Other specified symptoms and signs involving the circulatory and respiratory systems: Secondary | ICD-10-CM | POA: Diagnosis not present

## 2011-07-19 DIAGNOSIS — I10 Essential (primary) hypertension: Secondary | ICD-10-CM

## 2011-07-19 DIAGNOSIS — K449 Diaphragmatic hernia without obstruction or gangrene: Secondary | ICD-10-CM | POA: Diagnosis present

## 2011-07-19 DIAGNOSIS — I739 Peripheral vascular disease, unspecified: Secondary | ICD-10-CM | POA: Diagnosis present

## 2011-07-19 DIAGNOSIS — K520 Gastroenteritis and colitis due to radiation: Secondary | ICD-10-CM

## 2011-07-19 DIAGNOSIS — C240 Malignant neoplasm of extrahepatic bile duct: Secondary | ICD-10-CM

## 2011-07-19 DIAGNOSIS — C7800 Secondary malignant neoplasm of unspecified lung: Secondary | ICD-10-CM | POA: Diagnosis present

## 2011-07-19 DIAGNOSIS — Z79899 Other long term (current) drug therapy: Secondary | ICD-10-CM

## 2011-07-19 DIAGNOSIS — C249 Malignant neoplasm of biliary tract, unspecified: Secondary | ICD-10-CM

## 2011-07-19 DIAGNOSIS — K219 Gastro-esophageal reflux disease without esophagitis: Secondary | ICD-10-CM

## 2011-07-19 DIAGNOSIS — F3289 Other specified depressive episodes: Secondary | ICD-10-CM

## 2011-07-19 DIAGNOSIS — I7389 Other specified peripheral vascular diseases: Secondary | ICD-10-CM

## 2011-07-19 DIAGNOSIS — Z9089 Acquired absence of other organs: Secondary | ICD-10-CM

## 2011-07-19 DIAGNOSIS — F329 Major depressive disorder, single episode, unspecified: Secondary | ICD-10-CM

## 2011-07-19 DIAGNOSIS — K2971 Gastritis, unspecified, with bleeding: Principal | ICD-10-CM | POA: Diagnosis present

## 2011-07-19 DIAGNOSIS — M719 Bursopathy, unspecified: Secondary | ICD-10-CM | POA: Diagnosis present

## 2011-07-19 DIAGNOSIS — Z5111 Encounter for antineoplastic chemotherapy: Secondary | ICD-10-CM

## 2011-07-19 DIAGNOSIS — I745 Embolism and thrombosis of iliac artery: Secondary | ICD-10-CM | POA: Diagnosis present

## 2011-07-19 DIAGNOSIS — R9431 Abnormal electrocardiogram [ECG] [EKG]: Secondary | ICD-10-CM

## 2011-07-19 LAB — CBC WITH DIFFERENTIAL (CANCER CENTER ONLY)
BASO#: 0 10*3/uL (ref 0.0–0.2)
EOS%: 0.6 % (ref 0.0–7.0)
LYMPH#: 0.4 10*3/uL — ABNORMAL LOW (ref 0.9–3.3)
MCH: 32.6 pg (ref 26.0–34.0)
MCHC: 30.8 g/dL — ABNORMAL LOW (ref 32.0–36.0)
MCV: 106 fL — ABNORMAL HIGH (ref 81–101)
MONO#: 1.1 10*3/uL — ABNORMAL HIGH (ref 0.1–0.9)
NEUT#: 6.2 10*3/uL (ref 1.5–6.5)
NEUT%: 79.7 % (ref 39.6–80.0)

## 2011-07-19 LAB — COMPREHENSIVE METABOLIC PANEL
ALT: 13 U/L (ref 0–35)
ALT: 12 U/L (ref 0–35)
AST: 18 U/L (ref 0–37)
AST: 19 U/L (ref 0–37)
Alkaline Phosphatase: 63 U/L (ref 39–117)
CO2: 26 mEq/L (ref 19–32)
Chloride: 105 mEq/L (ref 96–112)
Creatinine, Ser: 0.6 mg/dL (ref 0.50–1.10)
GFR calc Af Amer: >90 mL/min (ref >90)
GFR calc non Af Amer: >90 mL/min (ref >90)
Glucose, Bld: 104 mg/dL — ABNORMAL HIGH (ref 70–99)
Glucose, Bld: 99 mg/dL (ref 70–99)
Sodium: 140 mEq/L (ref 135–145)
Sodium: 137 mEq/L (ref 135–145)
Total Bilirubin: 0.3 mg/dL (ref 0.3–1.2)
Total Bilirubin: 0.2 mg/dL — ABNORMAL LOW (ref 0.3–1.2)
Total Protein: 4.9 g/dL — ABNORMAL LOW (ref 6.0–8.3)

## 2011-07-19 LAB — CBC
HCT: 18.6 % — ABNORMAL LOW (ref 36.0–46.0)
Hemoglobin: 5.9 g/dL — CL (ref 12.0–15.0)
MCH: 32.6 pg (ref 26.0–34.0)
MCHC: 31.7 g/dL (ref 30.0–36.0)
RBC: 1.81 MIL/uL — ABNORMAL LOW (ref 3.87–5.11)

## 2011-07-19 LAB — URINALYSIS, ROUTINE W REFLEX MICROSCOPIC
Bilirubin Urine: NEGATIVE
Glucose, UA: NEGATIVE mg/dL
Hgb urine dipstick: NEGATIVE
Ketones, ur: NEGATIVE mg/dL
Protein, ur: NEGATIVE mg/dL
Urobilinogen, UA: 0.2 mg/dL (ref 0.0–1.0)

## 2011-07-19 LAB — PROTIME-INR
INR: 1.17 (ref 0.00–1.49)
Prothrombin Time: 15.1 seconds (ref 11.6–15.2)

## 2011-07-19 LAB — DIFFERENTIAL
Basophils Relative: 0 % (ref 0–1)
Eosinophils Absolute: 0.1 10*3/uL (ref 0.0–0.7)
Eosinophils Relative: 1 % (ref 0–5)
Monocytes Absolute: 1.2 10*3/uL — ABNORMAL HIGH (ref 0.1–1.0)
Neutro Abs: 5.3 10*3/uL (ref 1.7–7.7)
Neutrophils Relative %: 75 % (ref 43–77)

## 2011-07-19 LAB — RETICULOCYTES: Retic Ct Pct: 10 % — ABNORMAL HIGH (ref 0.4–3.1)

## 2011-07-19 LAB — OCCULT BLOOD X 1 CARD TO LAB, STOOL: Fecal Occult Bld: POSITIVE

## 2011-07-19 MED ORDER — METOPROLOL TARTRATE 25 MG PO TABS
25.0000 mg | ORAL_TABLET | Freq: Three times a day (TID) | ORAL | Status: DC
  Administered 2011-07-19 – 2011-07-22 (×8): 25 mg via ORAL
  Filled 2011-07-19 (×10): qty 1

## 2011-07-19 MED ORDER — SIMVASTATIN 40 MG PO TABS
40.0000 mg | ORAL_TABLET | Freq: Every day | ORAL | Status: DC
  Administered 2011-07-19 – 2011-07-23 (×5): 40 mg via ORAL
  Filled 2011-07-19 (×6): qty 1

## 2011-07-19 MED ORDER — HYDROCODONE-ACETAMINOPHEN 5-325 MG PO TABS
1.0000 | ORAL_TABLET | ORAL | Status: DC | PRN
  Administered 2011-07-19 – 2011-07-20 (×3): 1 via ORAL
  Administered 2011-07-21 – 2011-07-23 (×2): 2 via ORAL
  Filled 2011-07-19 (×2): qty 1
  Filled 2011-07-19 (×3): qty 2

## 2011-07-19 MED ORDER — ALBUTEROL SULFATE (5 MG/ML) 0.5% IN NEBU: 2.5000 mg | INHALATION_SOLUTION | RESPIRATORY_TRACT | Status: DC | PRN

## 2011-07-19 MED ORDER — SODIUM CHLORIDE 0.9 % IV SOLN
8.0000 mg/h | INTRAVENOUS | Status: DC
  Administered 2011-07-19 – 2011-07-20 (×2): 8 mg/h via INTRAVENOUS
  Filled 2011-07-19 (×5): qty 80

## 2011-07-19 MED ORDER — ONDANSETRON HCL 4 MG PO TABS: 4.0000 mg | ORAL_TABLET | Freq: Four times a day (QID) | ORAL | Status: DC | PRN

## 2011-07-19 MED ORDER — GUAIFENESIN-DM 100-10 MG/5ML PO SYRP: 5.0000 mL | ORAL_SOLUTION | ORAL | Status: DC | PRN

## 2011-07-19 MED ORDER — METOPROLOL SUCCINATE ER 100 MG PO TB24: 100.0000 mg | ORAL_TABLET | Freq: Every day | ORAL | Status: DC

## 2011-07-19 MED ORDER — PROMETHAZINE HCL 25 MG PO TABS: 12.5000 mg | ORAL_TABLET | Freq: Three times a day (TID) | ORAL | Status: DC | PRN

## 2011-07-19 MED ORDER — DEXAMETHASONE 4 MG PO TABS
4.0000 mg | ORAL_TABLET | Freq: Every day | ORAL | Status: DC
  Administered 2011-07-20 – 2011-07-21 (×2): 4 mg via ORAL
  Filled 2011-07-19 (×3): qty 1

## 2011-07-19 MED ORDER — NITROGLYCERIN 0.4 MG SL SUBL: 0.4000 mg | SUBLINGUAL_TABLET | SUBLINGUAL | Status: DC | PRN

## 2011-07-19 MED ORDER — SODIUM CHLORIDE 0.9 % IV SOLN: 80.0000 mg | Freq: Once | INTRAVENOUS | Status: DC

## 2011-07-19 MED ORDER — LORAZEPAM 0.5 MG PO TABS
0.5000 mg | ORAL_TABLET | Freq: Three times a day (TID) | ORAL | Status: DC
  Administered 2011-07-20 (×2): 0.5 mg via ORAL
  Filled 2011-07-19 (×3): qty 1

## 2011-07-19 MED ORDER — DIPHENHYDRAMINE HCL 50 MG/ML IJ SOLN: 25.0000 mg | Freq: Four times a day (QID) | INTRAMUSCULAR | Status: DC | PRN

## 2011-07-19 MED ORDER — ONDANSETRON HCL 4 MG/2ML IJ SOLN: 4.0000 mg | Freq: Four times a day (QID) | INTRAMUSCULAR | Status: DC | PRN

## 2011-07-19 MED ORDER — FUROSEMIDE 10 MG/ML IJ SOLN
10.0000 mg | Freq: Once | INTRAMUSCULAR | Status: AC
  Administered 2011-07-19: 10 mg via INTRAVENOUS
  Filled 2011-07-19: qty 1

## 2011-07-19 MED ORDER — OXYCODONE-ACETAMINOPHEN 5-325 MG PO TABS
ORAL_TABLET | ORAL | Status: AC
  Filled 2011-07-19: qty 1

## 2011-07-19 MED ORDER — ACETAMINOPHEN 325 MG PO TABS
ORAL_TABLET | ORAL | Status: AC
  Filled 2011-07-19: qty 2

## 2011-07-19 MED ORDER — GABAPENTIN 300 MG PO CAPS
600.0000 mg | ORAL_CAPSULE | Freq: Three times a day (TID) | ORAL | Status: DC
  Administered 2011-07-19 – 2011-07-24 (×14): 600 mg via ORAL
  Filled 2011-07-19 (×16): qty 2

## 2011-07-19 MED ORDER — OXYCODONE-ACETAMINOPHEN 5-325 MG PO TABS
1.0000 | ORAL_TABLET | Freq: Once | ORAL | Status: AC
  Administered 2011-07-19: 1 via ORAL

## 2011-07-19 MED ORDER — SODIUM CHLORIDE 0.9 % IV SOLN
80.0000 mg | Freq: Once | INTRAVENOUS | Status: AC
  Administered 2011-07-19: 80 mg via INTRAVENOUS
  Filled 2011-07-19: qty 80

## 2011-07-19 NOTE — Progress Notes (Signed)
Pt in for blood draw only, c/o flu-like symptoms.  Seen by Dr Myna Hidalgo, sent to ED. Teola Bradley, Lillie Bollig [employer]

## 2011-07-19 NOTE — H&P (Addendum)
[name] CSN:[id number],MRN:[id number]  Outpatient Primary MD for the patient is KOLLAR, ELIZABETH, MD, MD  With History of -  Past Medical History  Diagnosis Date  . Hypertension   . Hyperlipidemia   . GERD (gastroesophageal reflux disease)   . Depression   . Esophageal stricture 2003     [facility] post esophageal dilation in 2003 performed by Dr. Perry, second esophageal dilation performed in 2006  by Dr. Perry  . Obstructive jaundice due to malignant neoplasm  February 2012     status post ERCP done by Dr. Perry with biliary sphincterotomy, bile duct stricture brushings, biliary stent placement, performed secondary to obstructive jaundice, malignant-appearing bile duct stricture of the common hepatic duct worrisome for primary cholangiocarcinoma, status post ERCP with biliary sphincterotomy and biliary stent placement...status post biopsy -  markedly atypical cells cons  . Rotator cuff syndrome 2010     per MRI March 2010 there is a prominent articular surface partial tear of the supraspinatus which is nearly full thickness, as well as a tear of the distal anterior supraspinatus which probably represents a full-thickness partial tear,  with moderate supraspinatus tendinopathy, moderate infraspinatus tendinopathy and prominent undersurface tear of the acromion along the coracoacromial ligament  . Hiatal hernia   . SOB (shortness of breath) on exertion   . Bile duct adenocarcinoma       Past Surgical History  Procedure Date  . Cholecystectomy  2007  .  aortogram   August 2011     ultrasound-guided access to the right common femoral artery, an aortogram with bilateral iliac arteriogram and bilateral lower extremity runoff, this was performed secondary to progressive bilateral lower extremity claudication, left common iliac artery occlusion as well as short segment of right superficial femoral artery occlusion, procedure performed by Dr. Dixon  . Bypass graft  January 2009     of the  infected femoral-femoral graft with vein patch angioplasty of bilateral femoral artery, 7 by Dr. Dickson,   .  abdominal aortic angiography  September 2008     performed by Dr. 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 Cardiac catheterization  August 2011     patent coronary arteries with diffuse nonobstructive disease, there are no areas of high grade stenosis present,  determined the chest pain is most likely noncardiac in origin, continue medical management.  . Bile duct surgery     60% of liver removed  . Arthroscopy knee w/ drilling   . Esophagogastroduodenoscopy 07/10/2011    Procedure: ESOPHAGOGASTRODUODENOSCOPY (EGD);  Surgeon: John Perry, MD;  Location: [facility];  Service: Endoscopy;  Laterality: N/A;    in for   Chief Complaint  Patient presents with  . Anemia     HPI  [name]  is a 60 y.o. female, with above dictated past medical and surgical history noticeably of cholangiocarcinoma, status post radiation and ongoing chemotherapy, who has had problems with upper GI bleed due to radiation and chemotherapy-induced gastritis and duodenitis, she was recently admitted and underwent an EGD and 2 unit of packed RBC transfusion with some improvement in her H&H , presents again with ongoing dark tarry stools and diffuse weakness, she was seen at [facility] this morning from where my partner Dr. Thompson accepted her in admission. Patient has now arrived to our facility a few minutes ago, and I was asked as   to see the patient .  Patient currently is relatively symptom-free except for complaining of dark tarry stools she denies any chest pain denies any dizziness denies any abdominal pain or discomfort she has a rather flat affect.   Review of Systems    In addition to the HPI above,  No  Fever-chills, No Headache, No changes with Vision or hearing, No problems swallowing food or Liquids, No Chest pain, Cough or Shortness of Breath, No Abdominal pain, No Nausea or Vommitting, Bowel movements are dark, No Blood in Urine, No dysuria, No new skin rashes or bruises, No new joints pains-aches,  No new weakness, tingling, numbness in any extremity, No recent weight gain or loss, No polyuria, polydypsia or polyphagia, No significant Mental Stressors.  A full 10 point Review of Systems was done, except as stated above, all other Review of Systems were negative.   Social History History  Substance Use Topics  . Smoking status: Former Smoker    Quit date: 06/14/1999  . Smokeless tobacco: Not on file  . Alcohol Use: No      Family History Family History  Problem Relation Age of Onset  . Heart disease Brother       Prior to Admission medications   Medication Sig Start Date End Date Taking? Authorizing Provider  dexamethasone (DECADRON) 4 MG tablet Take 4-8 mg by mouth daily with breakfast. Take 2 tabs by mouth days 1 and 3 of chemo cycle, then 1 tab by mouth days 4 and 5 of each cycle of chemo   Yes Nancy Rudolph, FNP  gabapentin (NEURONTIN) 300 MG capsule Take 2 capsules (600 mg total) by mouth 3 (three) times daily. 06/14/11  Yes Elizabeth Kollar, MD  LORazepam (ATIVAN) 0.5 MG tablet Take 0.5 mg by mouth every 8 (eight) hours.     Yes Historical Provider, MD  metoprolol (TOPROL-XL) 100 MG 24 hr tablet Take 1 tablet (100 mg total) by mouth daily. 06/14/11  Yes Elizabeth Kollar, MD  Omeprazole 20 MG TBEC Take 1 tablet (20 mg total) by mouth 2 (two) times daily. 06/14/11  Yes Elizabeth Kollar, MD  oxycodone (OXY-IR) 5 MG capsule Take 5 mg by mouth every 4 (four) hours as needed. For pain   Yes Historical Provider, MD  promethazine (PHENERGAN) 12.5 MG tablet Take 12.5 mg by mouth every 6 (six) hours as needed. For nausea   Yes Historical Provider, MD  simvastatin (ZOCOR) 40  MG tablet Take 1 tablet (40 mg total) by mouth at bedtime. 06/14/11  Yes Elizabeth Kollar, MD  nitroGLYCERIN (NITROSTAT) 0.4 MG SL tablet Place 1 tablet (0.4 mg total) under the tongue every 5 (five) minutes as needed. Up to 3 times for chest pain 06/14/11   Elizabeth Kollar, MD    Allergies  Allergen Reactions  . Sulfonamide Derivatives Hives    Physical Exam  Intake/Output Summary (Last 24 hours) at 07/19/11 2059 Last data filed at 07/19/11 1933  Gross per 24 hour  Intake      0 ml  Output   1200 ml  Net  -1200 ml   Blood pressure 130/69, pulse 67, temperature 100.2 F (37.9 C), temperature source Oral, resp. rate 17, SpO2 100.00%.  1. General middle aged AA Female lying in bed in NAD,     2. Normal affect and insight, Not Suicidal or Homicidal, Awake Alert, Oriented *3.  3. No F.N deficits, ALL C.Nerves Intact, Strength 5/5 all 4 extremities, Sensation intact all 4 extremities, Plantars down going.  4.   Ears and Eyes appear Normal, Conjunctivae clear, PERRLA. Moist Oral Mucosa.  5. Supple Neck, No JVD, No cervical lymphadenopathy appriciated, No Carotid Bruits.  6. Symmetrical Chest wall movement, Good air movement bilaterally, CTAB.  7. RRR, No Gallops, Rubs or Murmurs, No Parasternal Heave.  8. Positive Bowel Sounds, Abdomen Soft, Non tender, No organomegaly appriciated,       No rebound -guarding or rigidity. FOBT +ve.  9.  No Cyanosis, Normal Skin Turgor, No Skin Rash or Bruise.  10. Good muscle tone,  joints appear normal , no effusions, Normal ROM.  11. No Palpable Lymph Nodes in Neck or Axillae     Data Review  CBC  Lab 07/19/11 1250 07/19/11 1003  WBC 7.1 7.8  HGB 5.9* 6.1*  HCT 18.6* 19.8*  PLT 114* 116*  MCV 102.8* 106*  MCH 32.6 --  MCHC 31.7 --  RDW 19.1* --  LYMPHSABS 0.5* --  MONOABS 1.2* --  EOSABS 0.1 0.1  BASOSABS 0.0 0.0  BANDABS -- --    ------------------------------------------------------------------------------------------------------------------ Chemistries   Lab 07/19/11 1250 07/19/11 1003  NA 137 140  K 3.9 4.1  CL 105 106  CO2 26 27  GLUCOSE 99 104*  BUN 14 14  CREATININE 0.60 0.73  CALCIUM 8.5 8.4  MG -- --  AST 19 18  ALT 12 13  ALKPHOS 69 63  BILITOT 0.2* 0.3    Imaging results:   Dg Chest 2 View  07/19/2011  *RADIOLOGY REPORT*  Clinical Data: Upper GI bleed.Hypertension.  Bile duct carcinoma.  CHEST - 2 VIEW  Comparison: 11/11/2010  Findings: Right Port-A-Cath is in place with the tip in the SVC. No pneumothorax.  Heart is mildly enlarged.  Blunting of the right costophrenic angle compatible with small effusion, significantly improved since prior study. No focal right airspace opacity.  Left lung is clear.  No acute bony abnormality.  IMPRESSION: Small right effusion, much improved since prior study.  Mild cardiomegaly.  Original Report Authenticated By: KEVIN G. DOVER, M.D.   Assessment & Plan  1.  Generalized weakness due to upper GI bleed induced anemia- plan is to admit the patient to step down I have already discussed the case with Dr. Pirtle was on the floor, will commence with transfusing 3 units of packed RBCs patient is hemodynamically stable, will give her IV Protonix bolus followed by drip, she had a recent EGD which revealed radiation/chemotherapy used gastroduodenitis, most likely she has this problem ongoing as she says that her stools never became light in the last week. GI to see in the morning and do EGD. We'll keep her n.p.o. after midnight except for sips of water for medications. Monitor H&H post transfusion.  2. Cholangiocarcinoma - status post liver resection surgery done at outlying tertiary hospital - once stable outpatient Hem onch follow up, of note patient is still on Decadron.  3. History of hypertension currently blood pressure is stable patient is on high-dose Toprol XL,  will reduce it to Lopressor 25 q. 8 hrs with holding parameters as she has the potential to drop her blood pressure.   4. History of dyslipidemia no acute issues continue home dose statin.  5. History of diffuse non-obstructive CAD no acute issues patient is pain-free beta blocker and when necessary nitroglycerin  6. Low grade Temp on arrival 100.2 - repeat 99.8, CXR stable earlier, will repeat with UA, B Cultures if temp>100.4   DVT Prophylaxis  SCDs    AM Labs Ordered, also please review Full   Orders Admission, patients condition and plan of care including tests being ordered have been discussed with the patient   who indicates understanding and agree with the plan and Code Status.  Code Status full  Condition GUARDED  The patient is being admitted to the Hospitalist Service.   

## 2011-07-19 NOTE — ED Notes (Signed)
Complained of headache .  Snack meal given

## 2011-07-19 NOTE — ED Notes (Signed)
No further complaints. Voided approx 600cc

## 2011-07-19 NOTE — ED Notes (Signed)
Status remains same  Husband at bedside  Voided approx 400cc

## 2011-07-19 NOTE — ED Notes (Signed)
Pt sent here for low HGB and GI Bleed.

## 2011-07-19 NOTE — Progress Notes (Signed)
DIAGNOSES: 1. Metastatic cholangiocarcinoma. 2. 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  CURRENT THERAPY:  The patient is status post, I think it was 3 cycles of chemotherapy with cisplatin/Gemzar, on hold secondary to bleeding.  INTERIM HISTORY:  [name] comes in for routine lab work. Unfortunately, she has persistent bleeding from the stomach.  She has been seen by Dr. Yancey Flemings.  Dr. Marina Goodell did an endoscopy on her.  This showed fairly extensive radiation-induced gastritis.  He did go ahead and perform laser coagulation of the affected areas.  He felt that she would need some additional treatments, however.  He has set her up, I think on December 19th.  She came in today.  She does feel tired.  She has had a cough for the past 5 days.  She has been coughing up greenish mucus.  Unfortunately, she has not told anybody.  She still has dark stools.  She had lab work done which showed a hemoglobin of 6.1.  She was transfused last week with 2 units of blood.  She has not had chemotherapy now for over a month because of this GI bleeding and my concern that this would be exacerbated by chemotherapy.  I felt that she would probably need to be hospitalized.  I felt that she needed inpatient management.  We will send her down to the emergency room and then have her admitted to the hospitalist service since she is not on chemotherapy right now.  She has some abdominal discomfort.  Her appetite is okay.  PHYSICAL EXAMINATION:  This is a fairly well-developed, well-nourished black female in no obvious distress.  Vital signs:  Temperature 99, pulse 72, respiratory rate 18, blood pressure 116/63.  Head and neck: Shows a normocephalic, atraumatic skull.  There are no ocular or oral lesions.  There are no palpable cervical or supraclavicular lymph nodes. Lungs:  Clear bilaterally.  Cardiac:  Regular rate and rhythm with normal S1 and S2.  There are no  murmurs, rubs or bruits.  Abdomen:  Soft with good bowel sounds.  There is no fluid wave.  There is no guarding or rebound tenderness.  There was no palpable hepatosplenomegaly. Rectal:  Shows dark stool that is grossly heme-positive.  Extremities: Show some trace edema in the legs bilaterally.  LABORATORY STUDIES:  White cell count 7.8, hemoglobin 6.1, hematocrit 19.8, platelet count 116.  BUN and creatinine of 14 and 0.73.  IMPRESSION:  [name] is a 60 year old black female with metastatic cholangiocarcinoma.  She was on active chemotherapy.  However, this GI bleeding is a more pressing problem.  We really need to get better control of this.  Again, we will get her down to the emergency room.  She will be admitted to the hospitalist service for transfusion and further workup.  I did speak with Dr. Yancey Flemings.  He does not feel that there is any indication for a GI evaluation unless she is grossly bleeding.  We will probably follow her while she is in the hospital.    ______________________________ Josph Macho, M.D. PRE/MEDQ  D:  07/19/2011  T:  07/19/2011  Job:  [id number]

## 2011-07-19 NOTE — ED Provider Notes (Cosign Needed)
History     CSN: [id number] Arrival date & time: 07/19/2011 12:16 PM   First MD Initiated Contact with Patient 07/19/11 1228      Chief Complaint  Patient presents with  . Anemia    (Consider location/radiation/quality/duration/timing/severity/associated sxs/prior treatment) HPI Comments: The patient is a 60 year old woman with a history of metastatic cholangiocarcinoma who who was sent from Dr. Gustavo Lah office because she has been having GI bleeding. She has had dark stools. There has been no hematemesis. She had upper endoscopy by Yancey Flemings, M.D., gastroenterologist, who reportedly treated areas of gastritis with laser. She has continued to have dark stools. Dr. Victorino Dike advised that her hemoglobin was only 5 today. He therefore sent her to [facility] ED for evaluation and probable transfer for inpatient hospitalization.  Patient is a 60 y.o. female presenting with anemia. The history is provided by the patient and medical records. No language interpreter was used.  Anemia This is a recurrent problem. The problem has not changed since onset.The symptoms are aggravated by nothing. The symptoms are relieved by nothing. Treatments tried: She has had transfusions on a couple of occasions in the recent past.    Past Medical History  Diagnosis Date  . Hypertension   . Hyperlipidemia   . GERD (gastroesophageal reflux disease)   . Depression   . Esophageal stricture 2003     [place] post esophageal dilation in 2003 performed by Dr. Marina Goodell, second esophageal dilation performed in 2006  by Dr. Marina Goodell  . Obstructive jaundice due to malignant neoplasm  February 2012     status post ERCP done by Dr. Marina Goodell with biliary sphincterotomy, bile duct stricture brushings, biliary stent placement, performed secondary to obstructive jaundice, malignant-appearing bile duct stricture of the common hepatic duct worrisome for primary cholangiocarcinoma, status post ERCP with biliary sphincterotomy and  biliary stent placement...status post biopsy -  markedly atypical cells cons  . Rotator cuff syndrome 2010     per MRI March 2010 there is a prominent articular surface partial tear of the supraspinatus which is nearly full thickness, as well as a tear of the distal anterior supraspinatus which probably represents a full-thickness partial tear,  with moderate supraspinatus tendinopathy, moderate infraspinatus tendinopathy and prominent undersurface tear of the acromion along the coracoacromial ligament  . Hiatal hernia   . SOB (shortness of breath) on exertion   . Bile duct adenocarcinoma     Past Surgical History  Procedure Date  . Cholecystectomy  2007  .  aortogram   August 2011     ultrasound-guided access to the right common femoral artery, an aortogram with bilateral iliac arteriogram and bilateral lower extremity runoff, this was performed secondary to progressive bilateral lower extremity claudication, left common iliac artery occlusion as well as short segment of right superficial femoral artery occlusion, procedure performed by Dr. Durwin Nora  . Bypass graft  January 2009     of the infected femoral-femoral graft with vein patch angioplasty of bilateral femoral artery, 7 by [name],   .  abdominal aortic angiography  September 2008     performed by Dr. 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 Cardiac catheterization  August 2011     patent coronary arteries with diffuse nonobstructive disease, there are no areas of high grade stenosis present,  determined the chest pain is most likely  noncardiac in origin, continue medical management.  . Bile duct surgery     60% of liver removed  . Arthroscopy knee w/ drilling   . Esophagogastroduodenoscopy 07/10/2011    Procedure: ESOPHAGOGASTRODUODENOSCOPY (EGD);   Surgeon: Yancey Flemings, MD;  Location: [facility];  Service: Endoscopy;  Laterality: N/A;    Family History  Problem Relation Age of Onset  . Heart disease Brother     History  Substance Use Topics  . Smoking status: Former Smoker    Quit date: 06/14/1999  . Smokeless tobacco: Not on file  . Alcohol Use: No    OB History    Grav Para Term Preterm Abortions TAB SAB Ect Mult Living                  Review of Systems  Constitutional: Negative.   HENT: Negative.   Eyes: Negative.   Respiratory: Negative.   Cardiovascular: Negative.   Gastrointestinal:       She has had dark and tarry bowel movements.  Genitourinary: Negative.   Musculoskeletal: Negative.   Neurological: Negative.   Psychiatric/Behavioral: Negative.     Allergies  Sulfonamide derivatives  Home Medications   Current Outpatient Rx  Name Route Sig Dispense Refill  . DEXAMETHASONE 4 MG PO TABS Oral Take 4 mg by mouth daily with breakfast. Take 2 tabs by mouth days 1 and 3 of chemo cycle, then 1 tab by mouth days 4 and 5 of each cycle of chemo    . GABAPENTIN 300 MG PO CAPS Oral Take 2 capsules (600 mg total) by mouth 3 (three) times daily. 180 capsule 3  . LORAZEPAM 0.5 MG PO TABS Oral Take 0.5 mg by mouth every 8 (eight) hours.      Marland Kitchen METOPROLOL SUCCINATE ER 100 MG PO TB24 Oral Take 1 tablet (100 mg total) by mouth daily. 90 tablet 3  . NITROGLYCERIN 0.4 MG SL SUBL Sublingual Place 1 tablet (0.4 mg total) under the tongue every 5 (five) minutes as needed. Up to 3 times for chest pain 10 tablet 1  . OMEPRAZOLE 20 MG PO TBEC Oral Take 1 tablet (20 mg total) by mouth 2 (two) times daily. 180 each 3  . OXYCODONE HCL 5 MG PO CAPS Oral Take 5 mg by mouth every 4 (four) hours as needed.      Marland Kitchen PROMETHAZINE HCL 12.5 MG PO TABS Oral Take 12.5 mg by mouth every 6 (six) hours as needed.      Marland Kitchen SIMVASTATIN 40 MG PO TABS Oral Take 1 tablet (40 mg total) by mouth at bedtime. 31 tablet 1    BP 122/64  Pulse 72   Temp(Src) 99 F (37.2 C) (Oral)  Resp 16  SpO2 100%  Physical Exam  Nursing note and vitals reviewed. Constitutional: She is oriented to person, place, and time.       Patient is a pale leg-middle-aged woman in no distress.  HENT:  Head: Normocephalic and atraumatic.  Right Ear: External ear normal.  Left Ear: External ear normal.  Mouth/Throat: Oropharynx is clear and moist.  Eyes: Conjunctivae and EOM are normal. Pupils are equal, round, and reactive to light.  Neck: Normal range of motion. Neck supple. No thyromegaly present.  Cardiovascular: Normal rate, regular rhythm and normal heart sounds.   Pulmonary/Chest: Effort normal and breath sounds normal.  Abdominal: Soft. Bowel sounds are normal.  Genitourinary:       Rectal exam showed no mass or tenderness. There was minimal stool in  the rectal vault. Stool sample for Hemoccult was sent.  Musculoskeletal: Normal range of motion.  Lymphadenopathy:    She has no cervical adenopathy.  Neurological: She is alert and oriented to person, place, and time.       No sensory or motor deficit.  Skin: Skin is warm and dry.  Psychiatric: She has a normal mood and affect. Her behavior is normal.    ED Course  Procedures (including critical care time)  Labs Reviewed  CBC - Abnormal; Notable for the following:    RBC 1.81 (*)    Hemoglobin 5.9 (*)    HCT 18.6 (*)    MCV 102.8 (*)    RDW 19.1 (*)    Platelets 114 (*)    All other components within normal limits  DIFFERENTIAL - Abnormal; Notable for the following:    Lymphocytes Relative 7 (*)    Monocytes Relative 17 (*)    Lymphs Abs 0.5 (*)    Monocytes Absolute 1.2 (*)    All other components within normal limits  COMPREHENSIVE METABOLIC PANEL - Abnormal; Notable for the following:    Total Protein 5.6 (*)    Albumin 2.7 (*)    Total Bilirubin 0.2 (*)    All other components within normal limits  LIPASE, BLOOD - Abnormal; Notable for the following:    Lipase 10 (*)    All  other components within normal limits  URINALYSIS, ROUTINE W REFLEX MICROSCOPIC  OCCULT BLOOD X 1 CARD TO LAB, STOOL  URINE CULTURE  POCT OCCULT BLOOD STOOL, DEVICE   3:22 PM Patient was seen and had physical examination. Laboratory tests were ordered. Old charts were reviewed.  3:22 PM  Date: 07/19/2011  Rate: 72 Rhythm: normal sinus rhythm  QRS Axis: normal  Intervals: normal  ST/T Wave abnormalities: Inverted T waves in inferior and lateral leads.  Conduction Disutrbances:none  Narrative Interpretation: Abnormal EKG.  Old EKG Reviewed: unchanged 3:22 PM Results for orders placed during the hospital encounter of 07/19/11  CBC      Component Value Range   WBC 7.1  4.0 - 10.5 (K/uL)   RBC 1.81 (*) 3.87 - 5.11 (MIL/uL)   Hemoglobin 5.9 (*) 12.0 - 15.0 (g/dL)   HCT 16.1 (*) 09.6 - 46.0 (%)   MCV 102.8 (*) 78.0 - 100.0 (fL)   MCH 32.6  26.0 - 34.0 (pg)   MCHC 31.7  30.0 - 36.0 (g/dL)   RDW 04.5 (*) 40.9 - 15.5 (%)   Platelets 114 (*) 150 - 400 (K/uL)  DIFFERENTIAL      Component Value Range   Neutrophils Relative 75  43 - 77 (%)   Lymphocytes Relative 7 (*) 12 - 46 (%)   Monocytes Relative 17 (*) 3 - 12 (%)   Eosinophils Relative 1  0 - 5 (%)   Basophils Relative 0  0 - 1 (%)   Neutro Abs 5.3  1.7 - 7.7 (K/uL)   Lymphs Abs 0.5 (*) 0.7 - 4.0 (K/uL)   Monocytes Absolute 1.2 (*) 0.1 - 1.0 (K/uL)   Eosinophils Absolute 0.1  0.0 - 0.7 (K/uL)   Basophils Absolute 0.0  0.0 - 0.1 (K/uL)   RBC Morphology TEARDROP CELLS     Smear Review PLATELET COUNT CONFIRMED BY SMEAR    COMPREHENSIVE METABOLIC PANEL      Component Value Range   Sodium 137  135 - 145 (mEq/L)   Potassium 3.9  3.5 - 5.1 (mEq/L)   Chloride 105  96 -  112 (mEq/L)   CO2 26  19 - 32 (mEq/L)   Glucose, Bld 99  70 - 99 (mg/dL)   BUN 14  6 - 23 (mg/dL)   Creatinine, Ser 1.47  0.50 - 1.10 (mg/dL)   Calcium 8.5  8.4 - 82.9 (mg/dL)   Total Protein 5.6 (*) 6.0 - 8.3 (g/dL)   Albumin 2.7 (*) 3.5 - 5.2 (g/dL)   AST 19   0 - 37 (U/L)   ALT 12  0 - 35 (U/L)   Alkaline Phosphatase 69  39 - 117 (U/L)   Total Bilirubin 0.2 (*) 0.3 - 1.2 (mg/dL)   GFR calc non Af Amer >90  >90 (mL/min)   GFR calc Af Amer >90  >90 (mL/min)  LIPASE, BLOOD      Component Value Range   Lipase 10 (*) 11 - 59 (U/L)  URINALYSIS, ROUTINE W REFLEX MICROSCOPIC      Component Value Range   Color, Urine YELLOW  YELLOW    APPearance CLEAR  CLEAR    Specific Gravity, Urine 1.009  1.005 - 1.030    pH 6.5  5.0 - 8.0    Glucose, UA NEGATIVE  NEGATIVE (mg/dL)   Hgb urine dipstick NEGATIVE  NEGATIVE    Bilirubin Urine NEGATIVE  NEGATIVE    Ketones, ur NEGATIVE  NEGATIVE (mg/dL)   Protein, ur NEGATIVE  NEGATIVE (mg/dL)   Urobilinogen, UA 0.2  0.0 - 1.0 (mg/dL)   Nitrite NEGATIVE  NEGATIVE    Leukocytes, UA NEGATIVE  NEGATIVE   OCCULT BLOOD X 1 CARD TO LAB, STOOL      Component Value Range   Fecal Occult Bld POSITIVE     Dg Chest 2 View  07/19/2011  *RADIOLOGY REPORT*  Clinical Data: Upper GI bleed.Hypertension.  Bile duct carcinoma.  CHEST - 2 VIEW  Comparison: 11/11/2010  Findings: Right Port-A-Cath is in place with the tip in the SVC. No pneumothorax.  Heart is mildly enlarged.  Blunting of the right costophrenic angle compatible with small effusion, significantly improved since prior study. No focal right airspace opacity.  Left lung is clear.  No acute bony abnormality.  IMPRESSION: Small right effusion, much improved since prior study.  Mild cardiomegaly.  Original Report Authenticated By: Cyndie Chime, M.D.   3:22 PM Patient's laboratory tests were reviewed. Her albumin was low at 2.7. Total protein was low at 5.6.  Hct was low at 5.9.  Platelets were low at 114,000.  UA was normal.  Stool was positive for blood. Chest x-ray showed a small right pleural effusion.   3:22 PM Case discussed with Ramiro Harvest, M.D., of [facility].  He accepts pt in transfer to a stepdown bed at [facility].   1. Upper GI  bleeding   2. Metastatic cancer           Carleene Cooper III, MD 07/19/11 [phone]

## 2011-07-19 NOTE — Progress Notes (Signed)
This office note has been dictated.

## 2011-07-20 ENCOUNTER — Encounter (HOSPITAL_COMMUNITY)
Admission: EM | Disposition: A | Discharge status: Home / Self Care | Payer: Self-pay | Source: Home / Self Care | Attending: Internal Medicine

## 2011-07-20 ENCOUNTER — Encounter (HOSPITAL_COMMUNITY): Payer: Self-pay | Admitting: *Deleted

## 2011-07-20 ENCOUNTER — Inpatient Hospital Stay (HOSPITAL_COMMUNITY): Payer: Medicare Other

## 2011-07-20 DIAGNOSIS — C221 Intrahepatic bile duct carcinoma: Secondary | ICD-10-CM | POA: Diagnosis present

## 2011-07-20 DIAGNOSIS — D6489 Other specified anemias: Secondary | ICD-10-CM

## 2011-07-20 DIAGNOSIS — K520 Gastroenteritis and colitis due to radiation: Secondary | ICD-10-CM

## 2011-07-20 DIAGNOSIS — K921 Melena: Secondary | ICD-10-CM

## 2011-07-20 HISTORY — PX: ESOPHAGOGASTRODUODENOSCOPY: SHX5428

## 2011-07-20 LAB — BASIC METABOLIC PANEL
BUN: 8 mg/dL (ref 6–23)
Calcium: 8.5 mg/dL (ref 8.4–10.5)
Chloride: 108 mEq/L (ref 96–112)
Creatinine, Ser: 0.67 mg/dL (ref 0.50–1.10)
GFR calc Af Amer: >90 mL/min (ref >90)
GFR calc non Af Amer: >90 mL/min (ref >90)

## 2011-07-20 LAB — CBC
Hemoglobin: 10.5 g/dL — ABNORMAL LOW (ref 12.0–15.0)
MCH: 30.7 pg (ref 26.0–34.0)
MCHC: 34 g/dL (ref 30.0–36.0)
Platelets: 105 10*3/uL — ABNORMAL LOW (ref 150–400)
Platelets: 120 10*3/uL — ABNORMAL LOW (ref 150–400)
RBC: 3.42 MIL/uL — ABNORMAL LOW (ref 3.87–5.11)
RDW: 20.4 % — ABNORMAL HIGH (ref 11.5–15.5)
WBC: 5.7 10*3/uL (ref 4.0–10.5)
WBC: 5.8 10*3/uL (ref 4.0–10.5)

## 2011-07-20 LAB — VITAMIN B12: Vitamin B-12: 529 pg/mL (ref 211–911)

## 2011-07-20 LAB — URINALYSIS, ROUTINE W REFLEX MICROSCOPIC
Bilirubin Urine: NEGATIVE
Ketones, ur: NEGATIVE mg/dL
Leukocytes, UA: NEGATIVE
Nitrite: NEGATIVE
Protein, ur: NEGATIVE mg/dL
Urobilinogen, UA: 0.2 mg/dL (ref 0.0–1.0)
pH: 6 (ref 5.0–8.0)

## 2011-07-20 LAB — PREPARE RBC (CROSSMATCH)

## 2011-07-20 LAB — IRON AND TIBC
Saturation Ratios: 13 % — ABNORMAL LOW (ref 20–55)
TIBC: 256 ug/dL (ref 250–470)
UIBC: 223 ug/dL (ref 125–400)

## 2011-07-20 LAB — MRSA PCR SCREENING: MRSA by PCR: NEGATIVE

## 2011-07-20 SURGERY — EGD (ESOPHAGOGASTRODUODENOSCOPY)
Anesthesia: Moderate Sedation

## 2011-07-20 MED ORDER — PANTOPRAZOLE SODIUM 40 MG PO TBEC
40.0000 mg | DELAYED_RELEASE_TABLET | Freq: Two times a day (BID) | ORAL | Status: DC
  Administered 2011-07-21 – 2011-07-24 (×7): 40 mg via ORAL
  Filled 2011-07-20 (×8): qty 1

## 2011-07-20 MED ORDER — FENTANYL NICU IV SYRINGE 50 MCG/ML
INJECTION | INTRAMUSCULAR | Status: DC | PRN
  Administered 2011-07-20: 12.5 ug via INTRAVENOUS
  Administered 2011-07-20: 25 ug via INTRAVENOUS
  Administered 2011-07-20: 12.5 ug via INTRAVENOUS

## 2011-07-20 MED ORDER — SUCRALFATE 1 GM/10ML PO SUSP
1.0000 g | Freq: Four times a day (QID) | ORAL | Status: DC
  Administered 2011-07-20: 1 g via ORAL

## 2011-07-20 MED ORDER — PSEUDOEPHEDRINE HCL ER 120 MG PO TB12
120.0000 mg | ORAL_TABLET | Freq: Two times a day (BID) | ORAL | Status: DC
  Administered 2011-07-20 – 2011-07-24 (×9): 120 mg via ORAL
  Filled 2011-07-20 (×10): qty 1

## 2011-07-20 MED ORDER — SUCRALFATE 1 GM/10ML PO SUSP
1.0000 g | Freq: Four times a day (QID) | ORAL | Status: DC
  Administered 2011-07-21 – 2011-07-24 (×15): 1 g via ORAL
  Filled 2011-07-20 (×18): qty 10

## 2011-07-20 MED ORDER — NAPHAZOLINE-GLYCERIN 0.012-0.2 % OP SOLN
1.0000 [drp] | OPHTHALMIC | Status: DC | PRN
  Filled 2011-07-20: qty 15

## 2011-07-20 MED ORDER — FENTANYL CITRATE 0.05 MG/ML IJ SOLN
INTRAMUSCULAR | Status: AC
  Filled 2011-07-20: qty 2

## 2011-07-20 MED ORDER — BUTAMBEN-TETRACAINE-BENZOCAINE 2-2-14 % EX AERO
INHALATION_SPRAY | CUTANEOUS | Status: DC | PRN
  Administered 2011-07-20: 2 via TOPICAL

## 2011-07-20 MED ORDER — MIDAZOLAM HCL 10 MG/2ML IJ SOLN
INTRAMUSCULAR | Status: AC
  Filled 2011-07-20: qty 2

## 2011-07-20 MED ORDER — BENZONATATE 100 MG PO CAPS
100.0000 mg | ORAL_CAPSULE | Freq: Three times a day (TID) | ORAL | Status: DC
  Administered 2011-07-20 – 2011-07-24 (×13): 100 mg via ORAL
  Filled 2011-07-20 (×15): qty 1

## 2011-07-20 MED ORDER — LORATADINE 10 MG PO TABS
10.0000 mg | ORAL_TABLET | Freq: Every day | ORAL | Status: DC
  Administered 2011-07-20 – 2011-07-24 (×5): 10 mg via ORAL
  Filled 2011-07-20 (×5): qty 1

## 2011-07-20 MED ORDER — MIDAZOLAM HCL 10 MG/2ML IJ SOLN
INTRAMUSCULAR | Status: DC | PRN
  Administered 2011-07-20: 1 mg via INTRAVENOUS
  Administered 2011-07-20 (×2): 2 mg via INTRAVENOUS

## 2011-07-20 MED ORDER — SUCRALFATE 1 GM/10ML PO SUSP
1.0000 g | Freq: Three times a day (TID) | ORAL | Status: DC
  Administered 2011-07-20: 1 g via ORAL
  Filled 2011-07-20 (×2): qty 10

## 2011-07-20 MED ORDER — DIPHENHYDRAMINE HCL 50 MG/ML IJ SOLN
INTRAMUSCULAR | Status: AC
  Filled 2011-07-20: qty 1

## 2011-07-20 NOTE — Consult Note (Signed)
Patient seen and I agree with the above documentation, including the assessment and plan. Received 3 units of pRBC overnight with appropriate response. 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 One would expect the need for multiple sessions of APC to see adequate response. Cont ppi. After endo today, would rec sucralfate as this can help with microvascular injury (as seen when used in radiation proctitis)

## 2011-07-20 NOTE — Op Note (Signed)
[facility] [address] [place], [place]  [address]  ENDOSCOPY PROCEDURE REPORT  [name]:  [name], [name]  MR#:  [id number] BIRTHDATE:  September 05, 1950, 60 yrs. old  GENDER:  female ENDOSCOPIST:  Carie Caddy. Carvin Almas, MD Referred by:  [facility] PROCEDURE DATE:  07/20/2011 PROCEDURE:  EGD, diagnostic 43235 ASA CLASS:  Class III INDICATIONS:  melena, anemia, history of radiation induced gastritis/duodenitis MEDICATIONS:   Fentanyl 50 mcg IV, Versed 6 mg IV TOPICAL ANESTHETIC:  Cetacaine Spray  DESCRIPTION OF PROCEDURE:   After Sherry risks benefits and alternatives of Sherry procedure were thoroughly explained, informed consent was obtained.  Sherry  endoscope was introduced through Sherry mouth and advanced to Sherry second portion of Sherry duodenum, without limitations.  Sherry instrument was slowly withdrawn as Sherry mucosa was fully examined. <<PROCEDUREIMAGES>>  Sherry esophagus and gastroesophageal junction were completely normal in appearance.  Severe gastritis felt secondary to radiation-injury was found in Sherry distal gastric body and Sherry antrum of Sherry stomach.  This was characterized by areas of superficial ulceration (likely from recent APC treatment) and oozing erythema.  Radiation-induced duodenitis was found in Sherry bulb and early second portion of Sherry duodenum.  More distal duodenum was normal in appearance.    Retroflexed views revealed no abnormalities, other than one small patch of mild gastritis. Sherry scope was then withdrawn from [name] and Sherry procedure completed.  COMPLICATIONS:  None  ENDOSCOPIC IMPRESSION: 1) Normal esophagus 2) Severe radiation-related gastritis with oozing in Sherry distal gastric body and Sherry antrum of Sherry stomach.  Areas of ulceration, query related to recent APC treatment.  No discrete location of active bleeding. 3) Duodenitis, also felt radiation-induced.  RECOMMENDATIONS: 1) Follow HCT to ensure stabilization.  Additional transfusion may be  necessary. 2) Continue BID po PPI    3) Add sucralfate po QID 4) Follow-up with Dr. Marina Goodell after discharge for likely retreatment. 5) Frequent CBCs as an outpatient.  Carie Caddy. [name], MD  CC:  Yancey Flemings, MD [name]  n. eSIGNED:   Carie Caddy. Giovana Faciane at 07/20/2011 02:40 PM  [name], [phone]

## 2011-07-20 NOTE — Consult Note (Signed)
[facility] Consult: 9:26 AM 07/20/2011   Referring [name]: [name] Primary Care Physician:  Sherry Mech, MD, MD Primary Gastroenterologist:  Dr. Yancey [name]   Reason for Consultation:  Recurrent anemia and dark, heme positive stools.  HPI: [name] is a 60 y.o. female.  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  Subsequent chemo and radiation therapy. Chemo on hold because of GI blood loss.   Recently underwent EGD on 11/28 for transfusion requiring anemia and dark stools.  He treated severe radiation gastritis with APC.  She was set up for repeat EGD around Dec. 19th.  She has had ongoing formed, dark BMs ongoing since the procedure, ongoing fatigue.  Was dizzy yesterday.  Seen at [facility] center yesterday.  Hgb was 6.1, it was 9.0 on 11/26.   Stools grossly heme positive.  She was admitted to [facility] ICU and started on Protonix infusion.   She has received 3 units blood.  Hgb is 9.8.  BUN and coags are normal. She has been eating well.  No N/V.  Ongoing upper abdominal pain is not new.  She has the dark stools once daily. Past Medical History  Diagnosis Date  . Hypertension   . Hyperlipidemia   . GERD (gastroesophageal reflux disease)   . Depression   . Esophageal stricture 2003     [name] post esophageal dilation in 2003 performed by Dr. Marina [name], second esophageal dilation performed in 2006  by Dr. Marina [name]  . Obstructive jaundice due to malignant neoplasm  February 2012     status post ERCP done by Dr. Marina [name] with biliary sphincterotomy, bile duct stricture brushings, biliary stent placement, performed secondary to obstructive jaundice, malignant-appearing bile duct stricture of the common hepatic duct worrisome for primary cholangiocarcinoma, status post ERCP with biliary sphincterotomy and biliary stent placement...status post biopsy -  markedly atypical cells cons  . Rotator cuff  syndrome 2010     per MRI March 2010 there is a prominent articular surface partial tear of the supraspinatus which is nearly full thickness, as well as a tear of the distal anterior supraspinatus which probably represents a full-thickness partial tear,  with moderate supraspinatus tendinopathy, moderate infraspinatus tendinopathy and prominent undersurface tear of the acromion along the coracoacromial ligament  . Hiatal hernia   . SOB (shortness of breath) on exertion   . Bile duct adenocarcinoma     Past Surgical History  Procedure Date  . Cholecystectomy  2007  .  aortogram   August 2011     ultrasound-guided access to the right common femoral artery, an aortogram with bilateral iliac arteriogram and bilateral lower extremity runoff, this was performed secondary to progressive bilateral lower extremity claudication, left common iliac artery occlusion as well as short segment of right superficial femoral artery occlusion, procedure performed by Dr. Durwin [name]  . Bypass graft  January 2009     of the infected femoral-femoral graft with vein patch angioplasty of bilateral femoral artery, 7 by Dr. Edilia [name],   .  abdominal aortic angiography  September 2008     performed by Dr. 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 Cardiac catheterization  August 2011     patent coronary arteries with diffuse nonobstructive disease, there are no areas of high grade stenosis present,  determined the chest pain is most likely  noncardiac in origin, continue medical management.  . Bile duct surgery     60% of liver removed  . Arthroscopy knee w/ drilling   . Esophagogastroduodenoscopy 07/10/2011    Procedure: ESOPHAGOGASTRODUODENOSCOPY (EGD);  Surgeon: Sherry Flemings, MD;  Location: [name];  Service: Endoscopy;  Laterality: N/A;     Prior to Admission medications   Medication Sig Start Date End Date Taking? Authorizing [name]  dexamethasone (DECADRON) 4 MG tablet Take 4-8 mg by mouth daily with breakfast. Take 2 tabs by mouth days 1 and 3 of chemo cycle, then 1 tab by mouth days 4 and 5 of each cycle of chemo   Yes Sherry Cater, [name]  gabapentin (NEURONTIN) 300 MG capsule Take 2 capsules (600 mg total) by mouth 3 (three) times daily. 06/14/11  Yes Sherry Mech, MD  LORazepam (ATIVAN) 0.5 MG tablet Take 0.5 mg by mouth every 8 (eight) hours.     Yes Sherry Provider, MD  metoprolol (TOPROL-XL) 100 MG 24 hr tablet Take 1 tablet (100 mg total) by mouth daily. 06/14/11  Yes Sherry Mech, MD  Omeprazole 20 MG TBEC Take 1 tablet (20 mg total) by mouth 2 (two) times daily. 06/14/11  Yes Sherry Mech, MD  oxycodone (OXY-IR) 5 MG capsule Take 5 mg by mouth every 4 (four) hours as needed. For pain   Yes Sherry Provider, MD  promethazine (PHENERGAN) 12.5 MG tablet Take 12.5 mg by mouth every 6 (six) hours as needed. For nausea   Yes Sherry Provider, MD  simvastatin (ZOCOR) 40 MG tablet Take 1 tablet (40 mg total) by mouth at bedtime. 06/14/11  Yes Sherry Mech, MD  nitroGLYCERIN (NITROSTAT) 0.4 MG SL tablet Place 1 tablet (0.4 mg total) under the tongue every 5 (five) minutes as needed. Up to 3 times for chest pain 06/14/11   Sherry Mech, MD    Scheduled Meds:    . benzonatate  100 mg Oral TID  . dexamethasone  4 mg Oral Q breakfast  . furosemide  10 mg Intravenous Once  . gabapentin  600 mg Oral TID  . loratadine  10 mg Oral Daily  . LORazepam  0.5 mg Oral Q8H  . metoprolol tartrate  25 mg Oral TID  . oxyCODONE-acetaminophen  1 tablet Oral Once  . oxyCODONE-acetaminophen      . pantoprazole (PROTONIX) IV  80 mg Intravenous Once  . pseudoephedrine  120 mg Oral BID  . simvastatin  40 mg Oral QHS  . DISCONTD: metoprolol  100 mg Oral Daily  . DISCONTD: pantoprazole (PROTONIX) IV  80 mg  Intravenous Once   Infusions:    . pantoprozole (PROTONIX) infusion 8 mg/hr (07/20/11 0700)   PRN Meds: albuterol, diphenhydrAMINE, guaiFENesin-dextromethorphan, HYDROcodone-acetaminophen, naphazoline-glycerin, nitroGLYCERIN, ondansetron (ZOFRAN) IV, ondansetron, promethazine   Allergies as of 07/19/2011 - Review Complete 07/19/2011  Allergen Reaction Noted  . Sulfonamide derivatives Hives 05/26/2006    Family History  Problem Relation Age of Onset  . Heart disease Brother     History   Social History  . Marital Status: Married    Spouse Name: N/A    Number of Children: 3  . Years of Education: N/A   Occupational History  . Not on file.   Social History Main Topics  . Smoking status: Former Smoker    Quit date: 06/14/1999  . Smokeless tobacco: Not on file  . Alcohol Use: No  . Drug Use: No  . Sexually Active: No   Other Topics Concern  . Not on file  Social History Narrative  . No narrative on file    REVIEW OF SYSTEMS: Constitutional: weakness ENT:  No nose bleeds, some sneezing Pulm:  Greenish sputum with cough CV:  No palps or cp, chronic mild LE edema GU:  No dysuria GI:  above Heme:  above.    Transfusions:  Yes, last week and last night Neuro:  No confusion, no hemiweakness Derm:  No rash or itching Endocrine:  No excessive thirst or urination Immunization:  Has no recall of having gotten flu shot this season Travel:  None outside of local area.   PHYSICAL EXAM: Vital signs in last 24 hours: Temp:  [97.5 F (36.4 C)-100.2 F (37.9 C)] 99.5 F (37.5 C) (12/08 0800) Pulse Rate:  [55-77] 66  (12/08 0730) Resp:  [2-21] 16  (12/08 0730) BP: (93-144)/(50-83) 144/83 mmHg (12/08 0730) SpO2:  [99 %-100 %] 100 % (12/08 0730)   General: Tired and looks somewhat ill Head:  No signs trauma  Eyes:  Blood shot, R greater than L. Ears:  No hearing deficits  Nose:  "Sniffles" Mouth:  Very few teeth remain.  Mucosa is pink and moist Neck:  No JVD,  No mass Lungs:  Clear, does have productive cough Heart: RRR, no MRG, s1 S2 audible Abdomen:  Soft, NT, NT, active BS.  No masses or organomegaly.  Large surgical scars well healed with associated keloid formation..   Rectal: deferred   Musc/Skeltl: knee deformities B, c/w arthritis. Extremities:  Slight LE edema, not pitting  Neurologic:  A & O x 3.  No tremor.  Appropriate. Skin:  No rash Tattoos:  none  Psych:  Pleasant , cooperative.  Intake/Output from previous day: 12/07 0701 - 12/08 0700 In: 1166.7 [I.V.:125; Blood:941.7; IV Piggyback:100] Out: 2400 [Urine:2400] Intake/Output this shift:    LAB RESULTS:  Basename 07/20/11 0723 07/19/11 1250 07/19/11 1003  WBC 5.7 7.1 7.8  HGB 9.8* 5.9* 6.1*  HCT 28.8* 18.6* 19.8*  PLT 105* 114* 116*   BMET Lab Results  Component Value Date   NA 140 07/20/2011   NA 137 07/19/2011   NA 140 07/19/2011   K 3.7 07/20/2011   K 3.9 07/19/2011   K 4.1 07/19/2011   CL 108 07/20/2011   CL 105 07/19/2011   CL 106 07/19/2011   CO2 27 07/20/2011   CO2 26 07/19/2011   CO2 27 07/19/2011   GLUCOSE 77 07/20/2011   GLUCOSE 99 07/19/2011   GLUCOSE 104* 07/19/2011   BUN 8 07/20/2011   BUN 14 07/19/2011   BUN 14 07/19/2011   CREATININE 0.67 07/20/2011   CREATININE 0.60 07/19/2011   CREATININE 0.73 07/19/2011   CALCIUM 8.5 07/20/2011   CALCIUM 8.5 07/19/2011   CALCIUM 8.4 07/19/2011   LFT Lab Results  Component Value Date   PROT 5.6* 07/19/2011   PROT 4.9* 07/19/2011   PROT 5.3* 06/10/2011   PROT 5.3* 06/10/2011   ALBUMIN 2.7* 07/19/2011   ALBUMIN 2.9* 07/19/2011   ALBUMIN 3.2* 06/10/2011   ALBUMIN 3.2* 06/10/2011   AST 19 07/19/2011   AST 18 07/19/2011   AST 20 06/10/2011   AST 20 06/10/2011   ALT 12 07/19/2011   ALT 13 07/19/2011   ALT 12 06/10/2011   ALT 12 06/10/2011   ALKPHOS 69 07/19/2011   ALKPHOS 63 07/19/2011   ALKPHOS 86 06/10/2011   ALKPHOS 86 06/10/2011   BILITOT 0.2* 07/19/2011   BILITOT 0.3 07/19/2011   BILITOT 0.2* 06/10/2011   BILITOT  0.2* 06/10/2011  BILIDIR 0.8* 10/04/2010   BILIDIR 1.3* 09/30/2010   BILIDIR 4.4* 09/25/2010   IBILI 0.9 10/04/2010   IBILI 1.5* 09/30/2010   IBILI 2.9* 09/25/2010   PT/INR Lab Results  Component Value Date   INR 1.17 07/19/2011   INR 1.06 01/16/2011   INR 0.94 09/25/2010     Drugs of Abuse     Component Value Date/Time   LABOPIA POSITIVE* 09/19/2010 2359   COCAINSCRNUR NONE DETECTED 09/19/2010 2359   LABBENZ NONE DETECTED 09/19/2010 2359   AMPHETMU NONE DETECTED 09/19/2010 2359   THCU NONE DETECTED 09/19/2010 2359   LABBARB  Value: NONE DETECTED        DRUG SCREEN FOR MEDICAL PURPOSES ONLY.  IF CONFIRMATION IS NEEDED FOR ANY PURPOSE, NOTIFY LAB WITHIN 5 DAYS.        LOWEST DETECTABLE LIMITS FOR URINE DRUG SCREEN Drug Class       Cutoff (ng/mL) Amphetamine      1000 Barbiturate      200 Benzodiazepine   200 Tricyclics       300 Opiates          300 Cocaine          300 THC              50 09/19/2010 2359     RADIOLOGY STUDIES: Dg Chest 2 View  07/19/2011  *RADIOLOGY REPORT*  Clinical Data: Upper GI bleed.Hypertension.  Bile duct carcinoma.  CHEST - 2 VIEW  Comparison: 11/11/2010  Findings: Right Port-A-Cath is in place with the tip in the SVC. No pneumothorax.  Heart is mildly enlarged.  Blunting of the right costophrenic angle compatible with small effusion, significantly improved since prior study. No focal right airspace opacity.  Left lung is clear.  No acute bony abnormality.  IMPRESSION: Small right effusion, much improved since prior study.  Mild cardiomegaly.  Original Report Authenticated By: Cyndie Chime, M.D.   Portable Chest 1 View  07/20/2011  *RADIOLOGY REPORT*  Clinical Data: Cough.  PORTABLE CHEST - 1 VIEW  Comparison: PA and lateral chest 07/19/2011.  Findings: Port-A-Cath is in place.  Lungs are clear.  Heart size is mildly enlarged.  No pneumothorax.  Small right effusion seen on the prior study appears improved. No pneumothorax.  IMPRESSION: Improved small right pleural effusion.   Lungs appear clear.  Original Report Authenticated By: Bernadene Bell. D'ALESSIO, M.D.    ENDOSCOPIC STUDIES: See above egd description  IMPRESSION: 1.  Radiation gastritis  With associated bleeding and anemia from blood loss. 2.  Cholangiocarcinoma, partial hepatectomy march 2012 at [facility].  S/p radiation therapy.  Chemo therapy on hold because of GI bleed  PLAN: 1.  EGD today.   LOS: 1 day   Jennye Moccasin  07/20/2011, 9:26 AM Pager: [phone]

## 2011-07-20 NOTE — Progress Notes (Signed)
Subjective: Patient states feels so-so. Patient c/o upper respiratory sxs. Patient states no BM.  Objective: Vital signs in last 24 hours: Filed Vitals:   07/20/11 0645 07/20/11 0700 07/20/11 0715 07/20/11 0730  BP: 133/82 135/78 135/79 144/83  Pulse: 65 65 62 66  Temp:  98 F (36.7 C)    TempSrc:      Resp: 18 16 15 16   Height:      SpO2: 100% 100% 100% 100%    Intake/Output Summary (Last 24 hours) at 07/20/11 0807 Last data filed at 07/20/11 0700  Gross per 24 hour  Intake 1166.67 ml  Output   2400 ml  Net -1233.33 ml    Weight change:  General: Alert, awake, oriented x3, in no acute distress. Heart: Regular rate and rhythm, without murmurs, rubs, gallops. Lungs: Clear to auscultation bilaterally. Abdomen: Soft, nontender, nondistended, positive bowel sounds. Extremities: No clubbing cyanosis or edema with positive pedal pulses. Neuro: Grossly intact, nonfocal.    Lab Results:  [facility] 07/20/11 0723 07/19/11 1250  NA 140 137  K 3.7 3.9  CL 108 105  CO2 27 26  GLUCOSE 77 99  BUN 8 14  CREATININE 0.67 0.60  CALCIUM 8.5 8.5  MG -- --  PHOS -- --    Basename 07/19/11 1250 07/19/11 1003  AST 19 18  ALT 12 13  ALKPHOS 69 63  BILITOT 0.2* 0.3  PROT 5.6* 4.9*  ALBUMIN 2.7* 2.9*    Basename 07/19/11 1250  LIPASE 10*  AMYLASE --    Basename 07/20/11 0723 07/19/11 1250  WBC 5.7 7.1  NEUTROABS -- 5.3  HGB 9.8* 5.9*  HCT 28.8* 18.6*  MCV 90.9 102.8*  PLT PENDING 114*   No results found for this basename: CKTOTAL:3,CKMB:3,CKMBINDEX:3,TROPONINI:3 in the last 72 hours No results found for this basename: POCBNP:3 in the last 72 hours No results found for this basename: DDIMER:2 in the last 72 hours No results found for this basename: HGBA1C:2 in the last 72 hours No results found for this basename: CHOL:2,HDL:2,LDLCALC:2,TRIG:2,CHOLHDL:2,LDLDIRECT:2 in the last 72 hours No results found for this basename: TSH,T4TOTAL,FREET3,T3FREE,THYROIDAB in the last  72 hours  Basename 07/19/11 2041  VITAMINB12 --  FOLATE --  FERRITIN --  TIBC --  IRON --  RETICCTPCT 10.0*    Micro Results: No results found for this or any previous visit (from the past 240 hour(s)).  Studies/Results: Dg Chest 2 View  07/19/2011  *RADIOLOGY REPORT*  Clinical Data: Upper GI bleed.Hypertension.  Bile duct carcinoma.  CHEST - 2 VIEW  Comparison: 11/11/2010  Findings: Right Port-A-Cath is in place with the tip in the SVC. No pneumothorax.  Heart is mildly enlarged.  Blunting of the right costophrenic angle compatible with small effusion, significantly improved since prior study. No focal right airspace opacity.  Left lung is clear.  No acute bony abnormality.  IMPRESSION: Small right effusion, much improved since prior study.  Mild cardiomegaly.  Original Report Authenticated By: Cyndie Chime, M.D.    Medications:     . benzonatate  100 mg Oral TID  . dexamethasone  4 mg Oral Q breakfast  . furosemide  10 mg Intravenous Once  . gabapentin  600 mg Oral TID  . loratadine  10 mg Oral Daily  . LORazepam  0.5 mg Oral Q8H  . metoprolol tartrate  25 mg Oral TID  . oxyCODONE-acetaminophen  1 tablet Oral Once  . oxyCODONE-acetaminophen      . pantoprazole (PROTONIX) IV  80 mg Intravenous Once  .  pseudoephedrine  120 mg Oral BID  . simvastatin  40 mg Oral QHS  . DISCONTD: metoprolol  100 mg Oral Daily  . DISCONTD: pantoprazole (PROTONIX) IV  80 mg Intravenous Once    Assessment/Plan Principal Problem:  *Anemia associated with acute blood loss Active Problems:  Gastro-Duodenitis from Chemo radiation on EGD casing UGI Bleed  HYPERLIPIDEMIA  DEPRESSION  HYPERTENSION  GERD  Cancer, biliary, other site  Iron deficiency anemia secondary to blood loss (chronic)  Congestion  Cholangiocarcinoma  1. 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 Patient s/p 3 units PRBC. Hgb 9.8 Continue Protonix gtt. Patient for EGD today per discussion between  admitter and GI. Gi to see. Serial CBC.  Patient on chronic steriods.Follow. 2.Hx Metastatic cholangiocarcinoma - s/p liver resection. Chemo on hold. Continue decadron. Per hem/onc. 3. HTN- stable. Continue lopressor 4. Gerd- PPI 5.Upper resp sxs/congestion- Check rapid influenza A and B. Symptomatic treatment. 6. Hyperlipidemia- zocor 7. Prophylaxis- PPI for GI, SCDs for DVT.     LOS: 1 day   THOMPSON,DANIEL 07/20/2011, 8:07 AM

## 2011-07-21 ENCOUNTER — Other Ambulatory Visit: Payer: Self-pay

## 2011-07-21 DIAGNOSIS — D6489 Other specified anemias: Secondary | ICD-10-CM

## 2011-07-21 DIAGNOSIS — K520 Gastroenteritis and colitis due to radiation: Secondary | ICD-10-CM

## 2011-07-21 DIAGNOSIS — K921 Melena: Secondary | ICD-10-CM

## 2011-07-21 DIAGNOSIS — R9431 Abnormal electrocardiogram [ECG] [EKG]: Secondary | ICD-10-CM | POA: Diagnosis present

## 2011-07-21 LAB — INFLUENZA PANEL BY PCR (TYPE A & B)
Influenza A By PCR: NEGATIVE
Influenza B By PCR: NEGATIVE

## 2011-07-21 LAB — CARDIAC PANEL(CRET KIN+CKTOT+MB+TROPI)
CK, MB: 1.2 ng/mL (ref 0.3–4.0)
Relative Index: INVALID (ref 0.0–2.5)
Relative Index: INVALID (ref 0.0–2.5)
Total CK: 20 U/L (ref 7–177)
Troponin I: <0.3 ng/mL (ref <0.30)

## 2011-07-21 LAB — URINE CULTURE: Colony Count: 4000

## 2011-07-21 LAB — BASIC METABOLIC PANEL
BUN: 7 mg/dL (ref 6–23)
CO2: 28 mEq/L (ref 19–32)
Chloride: 106 mEq/L (ref 96–112)
GFR calc Af Amer: >90 mL/min (ref >90)
Potassium: 3.7 mEq/L (ref 3.5–5.1)

## 2011-07-21 LAB — CBC
HCT: 30.5 % — ABNORMAL LOW (ref 36.0–46.0)
RBC: 3.38 MIL/uL — ABNORMAL LOW (ref 3.87–5.11)
RDW: 20.1 % — ABNORMAL HIGH (ref 11.5–15.5)
WBC: 6.5 10*3/uL (ref 4.0–10.5)

## 2011-07-21 MED ORDER — LORAZEPAM 0.5 MG PO TABS: 0.5000 mg | ORAL_TABLET | Freq: Two times a day (BID) | ORAL | Status: DC | PRN

## 2011-07-21 NOTE — Progress Notes (Signed)
     [name] Daily Rounding Note 07/21/2011, 9:39 AM  SUBJECTIVE: A bit drowsy.  Still on clear liquids.  On po protonix and sucralfate.  Stool yesterday PM still dark.  Slightly dizzy.  OBJECTIVE: Sleeping, arouses easily. Looks well.  Vital signs in last 24 hours: Temp:  [97.3 F (36.3 C)-98.4 F (36.9 C)] 98 F (36.7 C) (12/09 0800) Pulse Rate:  [57-66] 62  (12/09 0400) Resp:  [14-28] 16  (12/09 0400) BP: (103-147)/(74-91) 127/81 mmHg (12/09 0400) SpO2:  [97 %-100 %] 100 % (12/09 0400) Weight:  [75.1 kg (165 lb 9.1 oz)] 165 lb 9.1 oz (75.1 kg) (12/09 0500)    Heart: RRR. Chest: Clear B. Abdomen: Soft, ND, active BS. Tender across upper abdomen Extremities: no edema .  Feet warm Neuro/Psych:  Pleasant , cooperative.  Not confused.  Intake/Output from previous day: 12/08 0701 - 12/09 0700 In: 240 [P.O.:240] Out: 500 [Urine:500]  Lab Results:  [facility] 07/21/11 0557 07/20/11 2100 07/20/11 1620 07/20/11 0723  WBC 6.5 -- 5.8 5.7  HGB 10.5* 10.2* 10.5* --  HCT 30.5* 30.2* 30.9* --  PLT 124* -- 120* 105*   BMET  Basename 07/21/11 0557 07/20/11 0723 07/19/11 1250  NA 139 140 137  K 3.7 3.7 3.9  CL 106 108 105  CO2 28 27 26   GLUCOSE 80 77 99  BUN 7 8 14   CREATININE 0.72 0.67 0.60  CALCIUM 9.2 8.5 8.5   LFT  Basename 07/19/11 1250  PROT 5.6*  ALBUMIN 2.7*  AST 19  ALT 12  ALKPHOS 69  BILITOT 0.2*  BILIDIR --  IBILI --   PT/INR  Basename 07/19/11 2130  LABPROT 15.1  INR 1.17     ASSESMENT: 1. 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 EGD yesterday: hemorrhagic gastritis and post ablation ulceration. 2. [name] bleeding  From gastritis and from post laser ulcerations. 3.  Anemia, secondary to blood loss from above.  S/p transfusions prbc x 3:  12/7-12/03/2011. Hgb and Hct stable.  4. Cholangiocarcinoma, partial hepatectomy march 2012 at [facility]. S/p radiation therapy. Chemo therapy on hold because of [name] bleed  PLAN: 1.  Watch CBC,  transfuse PRN. 2.  Has EGD scheduled for 12/19 with Dr. Marina Goodell at [facility] endo lab. 3.  Regular diet.  4. Continue Protonix and Carafate. 5.  CBC in AM 6. Does not need stool hemoccults, they will certainly be positive for a while.  LOS: 2 days   Jennye Moccasin  07/21/2011, 9:39 AM Pager: [phone]

## 2011-07-21 NOTE — Progress Notes (Signed)
Subjective: Patient states feeling better. No CP.Patient states congestion improved.  Objective: Vital signs in last 24 hours: Filed Vitals:   07/21/11 0000 07/21/11 0100 07/21/11 0400 07/21/11 0500  BP:  130/81 127/81   Pulse:  59 62   Temp: 97.3 F (36.3 C)  98.1 F (36.7 C)   TempSrc: Oral  Oral   Resp:  15 16   Height:      Weight:    75.1 kg (165 lb 9.1 oz)  SpO2:  99% 100%     Intake/Output Summary (Last 24 hours) at 07/21/11 0752 Last data filed at 07/21/11 0330  Gross per 24 hour  Intake    240 ml  Output    500 ml  Net   -260 ml    Weight change:  General: Alert, awake, oriented x3, in no acute distress. Heart: Regular rate and rhythm, without murmurs, rubs, gallops. Lungs: Clear to auscultation bilaterally. Abdomen: Soft, mild diffuse discomfort, nondistended, positive bowel sounds. Extremities: No clubbing cyanosis or edema with positive pedal pulses. Neuro: Grossly intact, nonfocal.    Lab Results:  [facility] 07/21/11 0557 07/20/11 0723  NA 139 140  K 3.7 3.7  CL 106 108  CO2 28 27  GLUCOSE 80 77  BUN 7 8  CREATININE 0.72 0.67  CALCIUM 9.2 8.5  MG -- --  PHOS -- --    Basename 07/19/11 1250 07/19/11 1003  AST 19 18  ALT 12 13  ALKPHOS 69 63  BILITOT 0.2* 0.3  PROT 5.6* 4.9*  ALBUMIN 2.7* 2.9*    Basename 07/19/11 1250  LIPASE 10*  AMYLASE --    Basename 07/21/11 0557 07/20/11 2100 07/20/11 1620 07/19/11 1250  WBC 6.5 -- 5.8 --  NEUTROABS -- -- -- 5.3  HGB 10.5* 10.2* -- --  HCT 30.5* 30.2* -- --  MCV 90.2 -- 90.4 --  PLT 124* -- 120* --   No results found for this basename: CKTOTAL:3,CKMB:3,CKMBINDEX:3,TROPONINI:3 in the last 72 hours No results found for this basename: POCBNP:3 in the last 72 hours No results found for this basename: DDIMER:2 in the last 72 hours No results found for this basename: HGBA1C:2 in the last 72 hours No results found for this basename: CHOL:2,HDL:2,LDLCALC:2,TRIG:2,CHOLHDL:2,LDLDIRECT:2 in the last  72 hours No results found for this basename: TSH,T4TOTAL,FREET3,T3FREE,THYROIDAB in the last 72 hours  Basename 07/19/11 2041  VITAMINB12 529  FOLATE 10.3  FERRITIN 707*  TIBC 256  IRON 33*  RETICCTPCT 10.0*    Micro Results: Recent Results (from the past 240 hour(s))  URINE CULTURE     Status: Normal   Collection Time   07/19/11  1:48 PM      Component Value Range Status Comment   Specimen Description URINE, CLEAN CATCH   Final    Special Requests NONE   Final    Setup Time [id number]   Final    Colony Count 4,000 COLONIES/ML   Final    Culture INSIGNIFICANT GROWTH   Final    Report Status 07/21/2011 FINAL   Final   MRSA PCR SCREENING     Status: Normal   Collection Time   07/20/11  3:16 AM      Component Value Range Status Comment   MRSA by PCR NEGATIVE  NEGATIVE  Final     Studies/Results: Dg Chest 2 View  07/19/2011  *RADIOLOGY REPORT*  Clinical Data: Upper GI bleed.Hypertension.  Bile duct carcinoma.  CHEST - 2 VIEW  Comparison: 11/11/2010  Findings: Right Port-A-Cath is in  place with the tip in the SVC. No pneumothorax.  Heart is mildly enlarged.  Blunting of the right costophrenic angle compatible with small effusion, significantly improved since prior study. No focal right airspace opacity.  Left lung is clear.  No acute bony abnormality.  IMPRESSION: Small right effusion, much improved since prior study.  Mild cardiomegaly.  Original Report Authenticated By: Cyndie Chime, M.D.   Portable Chest 1 View  07/20/2011  *RADIOLOGY REPORT*  Clinical Data: Cough.  PORTABLE CHEST - 1 VIEW  Comparison: PA and lateral chest 07/19/2011.  Findings: Port-A-Cath is in place.  Lungs are clear.  Heart size is mildly enlarged.  No pneumothorax.  Small right effusion seen on the prior study appears improved. No pneumothorax.  IMPRESSION: Improved small right pleural effusion.  Lungs appear clear.  Original Report Authenticated By: Bernadene Bell. Maricela Curet, M.D.    Medications:     .  benzonatate  100 mg Oral TID  . dexamethasone  4 mg Oral Q breakfast  . gabapentin  600 mg Oral TID  . loratadine  10 mg Oral Daily  . metoprolol tartrate  25 mg Oral TID  . pantoprazole  40 mg Oral BID AC  . pseudoephedrine  120 mg Oral BID  . simvastatin  40 mg Oral QHS  . sucralfate  1 g Oral Q6H  . DISCONTD: LORazepam  0.5 mg Oral Q8H  . DISCONTD: sucralfate  1 g Oral Q6H  . DISCONTD: sucralfate  1 g Oral TID WC & HS    Assessment/Plan Principal Problem:  *Anemia associated with acute blood loss Active Problems:  Gastro-Duodenitis from Chemo radiation on EGD casing UGI Bleed  HYPERLIPIDEMIA  DEPRESSION  HYPERTENSION  GERD  Cancer, biliary, other site  Iron deficiency anemia secondary to blood loss (chronic)  Congestion  Cholangiocarcinoma  1. 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 Patient s/p 3 units PRBC. Hgb 10.5. S/P EGD with severe radiation gastritis with oozing in distal gastric body and antrum, with areas of ulceration. PPI changed to BID. Continue carafate, Serial CBC.  Patient on chronic steriods. GI ff and appreciate input and rxcs. Follow. 2.Hx Metastatic cholangiocarcinoma - s/p liver resection. Chemo on hold. Continue decadron. Per hem/onc. 3. HTN- stable. Continue lopressor 4. Abn EKG - likely secondary to #1. Repeat EKG, cycle cardiac enzymes. Patient currently asymptomatic. Follow. 4. Gerd- PPI 5.Upper resp sxs/congestion-  influenza A and B PCR pending. Symptomatic treatment. 6. Hyperlipidemia- zocor 7. Prophylaxis- PPI for GI, SCDs for DVT.     LOS: 2 days   THOMPSON,DANIEL 07/21/2011, 7:52 AM

## 2011-07-21 NOTE — Progress Notes (Signed)
Pt transferred to Rm 1410, report called to [facility] RN @ 2105. Pt alert and oriented x 4, Vital signs within normal limits.

## 2011-07-21 NOTE — Progress Notes (Signed)
Patient seen and I agree with the above documentation, including the assessment and plan. --If Hgb stable, she likely can be discharged with close follow-up and monitoring of Hgb --When discharged, would do so on BID PPI and QID sucralfate. --Agree with advanced diet. -- Call with questions.

## 2011-07-22 DIAGNOSIS — K922 Gastrointestinal hemorrhage, unspecified: Secondary | ICD-10-CM

## 2011-07-22 DIAGNOSIS — C240 Malignant neoplasm of extrahepatic bile duct: Secondary | ICD-10-CM

## 2011-07-22 DIAGNOSIS — D6489 Other specified anemias: Secondary | ICD-10-CM

## 2011-07-22 DIAGNOSIS — K921 Melena: Secondary | ICD-10-CM

## 2011-07-22 DIAGNOSIS — R0602 Shortness of breath: Secondary | ICD-10-CM

## 2011-07-22 DIAGNOSIS — K520 Gastroenteritis and colitis due to radiation: Secondary | ICD-10-CM

## 2011-07-22 LAB — BASIC METABOLIC PANEL
BUN: 8 mg/dL (ref 6–23)
GFR calc non Af Amer: 76 mL/min — ABNORMAL LOW (ref >90)
Glucose, Bld: 95 mg/dL (ref 70–99)
Potassium: 3.8 mEq/L (ref 3.5–5.1)

## 2011-07-22 LAB — CBC
HCT: 29.2 % — ABNORMAL LOW (ref 36.0–46.0)
Hemoglobin: 9.8 g/dL — ABNORMAL LOW (ref 12.0–15.0)
MCH: 30.7 pg (ref 26.0–34.0)
MCHC: 33.6 g/dL (ref 30.0–36.0)
MCV: 91.5 fL (ref 78.0–100.0)

## 2011-07-22 LAB — CARDIAC PANEL(CRET KIN+CKTOT+MB+TROPI): Relative Index: INVALID (ref 0.0–2.5)

## 2011-07-22 MED ORDER — SODIUM CHLORIDE 0.9 % IJ SOLN
10.0000 mL | INTRAMUSCULAR | Status: DC | PRN
  Administered 2011-07-22: 10 mL

## 2011-07-22 MED ORDER — ACETAMINOPHEN 500 MG PO TABS
500.0000 mg | ORAL_TABLET | Freq: Once | ORAL | Status: AC
  Administered 2011-07-22: 500 mg via ORAL
  Filled 2011-07-22: qty 1

## 2011-07-22 MED ORDER — METOPROLOL TARTRATE 25 MG PO TABS
25.0000 mg | ORAL_TABLET | Freq: Two times a day (BID) | ORAL | Status: DC
  Administered 2011-07-22 – 2011-07-24 (×4): 25 mg via ORAL
  Filled 2011-07-22 (×5): qty 1

## 2011-07-22 MED ORDER — FOLIC ACID 1 MG PO TABS
1.0000 mg | ORAL_TABLET | Freq: Every day | ORAL | Status: DC
  Administered 2011-07-22 – 2011-07-24 (×3): 1 mg via ORAL
  Filled 2011-07-22 (×4): qty 1

## 2011-07-22 MED ORDER — SODIUM CHLORIDE 0.9 % IJ SOLN
10.0000 mL | Freq: Two times a day (BID) | INTRAMUSCULAR | Status: DC
  Administered 2011-07-22 – 2011-07-24 (×3): 10 mL

## 2011-07-22 MED ORDER — SODIUM CHLORIDE 0.9 % IJ SOLN
10.0000 mL | INTRAMUSCULAR | Status: DC | PRN
  Administered 2011-07-22 – 2011-07-24 (×3): 10 mL

## 2011-07-22 MED ORDER — SODIUM CHLORIDE 0.9 % IV SOLN
1020.0000 mg | Freq: Once | INTRAVENOUS | Status: AC
  Administered 2011-07-22: 1020 mg via INTRAVENOUS
  Filled 2011-07-22: qty 34

## 2011-07-22 NOTE — Progress Notes (Signed)
Subjective: "im feeling better and am ready to go home tomorrow i think" No events overnight. No complaints  Objective: Vital signs Filed Vitals:   07/21/11 2030 07/21/11 2130 07/22/11 0502 07/22/11 1353  BP: 111/72 112/77 124/87 100/67  Pulse: 66 66 65 72  Temp:  98 F (36.7 C) 98.5 F (36.9 C) 98.7 F (37.1 C)  TempSrc:  Oral Oral Oral  Resp: 16 18 18 16   Height:  5\' 3"  (1.6 m)    Weight:  75.3 kg (166 lb 0.1 oz)    SpO2: 100% 100% 100% 99%   Weight change: 0.2 kg (7.1 oz)    Intake/Output from previous day: 12/09 0701 - 12/10 0700 In: 370 [P.O.:360; I.V.:10] Out: 1150 [Urine:1150] Total I/O In: 744 [P.O.:600; I.V.:10; IV Piggyback:134] Out: 700 [Urine:700]   Physical Exam: General: Alert, awake, oriented x3, in no acute distress. HEENT: No bruits, no goiter. Mucus membrane moist/pink. PERRL Poor dentition Heart: Regular rate and rhythm, without murmurs, rubs, gallops. Lungs: Normal effort. Breath sounds clear to auscultation bilaterally.No wheeze, rhonchi, rales Abdomen:Obese, Soft, nondistended, positive bowel sounds. Mild tenderness to palpation throughout. No mass organmegaly noted Extremities: No clubbing cyanosis or edema with positive pedal pulses. Neuro: Grossly intact, nonfocal. Speech clear, facial symetry    Lab Results: Basic Metabolic Panel:  Basename 07/22/11 0207 07/21/11 0557  NA 139 139  K 3.8 3.7  CL 107 106  CO2 27 28  GLUCOSE 95 80  BUN 8 7  CREATININE 0.83 0.72  CALCIUM 8.8 9.2  MG -- --  PHOS -- --   Liver Function Tests: No results found for this basename: AST:2,ALT:2,ALKPHOS:2,BILITOT:2,PROT:2,ALBUMIN:2 in the last 72 hours No results found for this basename: LIPASE:2,AMYLASE:2 in the last 72 hours No results found for this basename: AMMONIA:2 in the last 72 hours CBC:  Basename 07/22/11 0207 07/21/11 0557  WBC 6.5 6.5  NEUTROABS -- --  HGB 9.8* 10.5*  HCT 29.2* 30.5*  MCV 91.5 90.2  PLT 124* 124*   Cardiac  Enzymes:  Basename 07/22/11 0207 07/21/11 1549 07/21/11 0801  CKTOTAL 18 20 17   CKMB 1.5 1.2 1.4  CKMBINDEX -- -- --  TROPONINI <0.30 <0.30 <0.30   BNP: No components found with this basename: POCBNP:3 D-Dimer: No results found for this basename: DDIMER:2 in the last 72 hours CBG: No results found for this basename: GLUCAP:6 in the last 72 hours Hemoglobin A1C: No results found for this basename: HGBA1C in the last 72 hours Fasting Lipid Panel: No results found for this basename: CHOL,HDL,LDLCALC,TRIG,CHOLHDL,LDLDIRECT in the last 72 hours Thyroid Function Tests: No results found for this basename: TSH,T4TOTAL,FREET4,T3FREE,THYROIDAB in the last 72 hours Anemia Panel:  [facility] 07/19/11 2041  VITAMINB12 529  FOLATE 10.3  FERRITIN 707*  TIBC 256  IRON 33*  RETICCTPCT 10.0*   Coagulation:  Basename 07/19/11 2130  LABPROT 15.1  INR 1.17   Urine Drug Screen: Drugs of Abuse     Component Value Date/Time   LABOPIA POSITIVE* 09/19/2010 2359   COCAINSCRNUR NONE DETECTED 09/19/2010 2359   LABBENZ NONE DETECTED 09/19/2010 2359   AMPHETMU NONE DETECTED 09/19/2010 2359   THCU NONE DETECTED 09/19/2010 2359   LABBARB  Value: NONE DETECTED        DRUG SCREEN FOR MEDICAL PURPOSES ONLY.  IF CONFIRMATION IS NEEDED FOR ANY PURPOSE, NOTIFY LAB WITHIN 5 DAYS.        LOWEST DETECTABLE LIMITS FOR URINE DRUG SCREEN Drug Class       Cutoff (ng/mL) Amphetamine  1000 Barbiturate      200 Benzodiazepine   200 Tricyclics       300 Opiates          300 Cocaine          300 THC              50 09/19/2010 2359    Alcohol Level: No results found for this basename: ETH:2 in the last 72 hours Urinalysis:  Misc. Labs:  Recent Results (from the past 240 hour(s))  URINE CULTURE     Status: Normal   Collection Time   07/19/11  1:48 PM      Component Value Range Status Comment   Specimen Description URINE, CLEAN CATCH   Final    Special Requests NONE   Final    Setup Time [id number]   Final    Colony  Count 4,000 COLONIES/ML   Final    Culture INSIGNIFICANT GROWTH   Final    Report Status 07/21/2011 FINAL   Final   MRSA PCR SCREENING     Status: Normal   Collection Time   07/20/11  3:16 AM      Component Value Range Status Comment   MRSA by PCR NEGATIVE  NEGATIVE  Final     Studies/Results: No results found.  Medications: Scheduled Meds:   . acetaminophen  500 mg Oral Once  . benzonatate  100 mg Oral TID  . ferumoxytol  1,020 mg Intravenous Once  . folic acid  1 mg Oral Daily  . gabapentin  600 mg Oral TID  . loratadine  10 mg Oral Daily  . metoprolol tartrate  25 mg Oral TID  . pantoprazole  40 mg Oral BID AC  . pseudoephedrine  120 mg Oral BID  . simvastatin  40 mg Oral QHS  . sodium chloride  10 mL Intracatheter Q12H  . sucralfate  1 g Oral Q6H  . DISCONTD: dexamethasone  4 mg Oral Q breakfast   Continuous Infusions:  PRN Meds:.albuterol, diphenhydrAMINE, guaiFENesin-dextromethorphan, HYDROcodone-acetaminophen, LORazepam, naphazoline-glycerin, nitroGLYCERIN, ondansetron (ZOFRAN) IV, ondansetron, promethazine, sodium chloride, DISCONTD: sodium chloride  Assessment/Plan:  Principal Problem:  1. 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 Patient s/p 3 units PRBC. Hgb 9.8 down from 10.5 yesterday. Iron given today per heme/onc for Iron level 33.  S/P EGD with severe radiation gastritis with oozing in distal gastric body and antrum, with areas of ulceration. PPI changed to BID. Continue carafate, Serial CBC. Patient on chronic steriods. GI ff and appreciate input and rxcs. OP repeat EGD scheduled 2.Hx Metastatic cholangiocarcinoma - s/p liver resection. Chemo on hold. Continue decadron. Per hem/onc.  3. HTN- stable but somewhat soft. Will change lopressor to 25mg  BID and monitor closely  4. Abn EKG - likely secondary to #1. EKG NSR. CE neg. Patient currently asymptomatic. Tele SR.  4. Gerd- PPI Follow up EGD scheduled 12/19 5.Upper resp  sxs/congestion- influenza A and B PCR negative. Symptomatic treatment. PT feeling better 6. Hyperlipidemia- zocor  7. Prophylaxis- PPI for GI, SCDs for DVT.   LOS: 3 days   [facility] M 07/22/2011, 3:36 PM

## 2011-07-22 NOTE — Progress Notes (Signed)
UR review completed. 

## 2011-07-22 NOTE — Consult Note (Signed)
[name], [name] NO.:  [id number]  MEDICAL RECORD NO.:  [id number]  LOCATION:  1410                         FACILITY:  [facility]  PHYSICIAN:  Josph Macho, M.D.  DATE OF BIRTH:  Oct 13, 1950  DATE OF CONSULTATION:  07/22/2011 DATE OF DISCHARGE:  07/19/2011                                CONSULTATION   REFERRING PHYSICIAN:  Ramiro Harvest, MD  REASON FOR CONSULTATION: 1. Upper GI bleed. 2. Metastatic cholangiocarcinoma.  HISTORY OF PRESENT ILLNESS:  The patient is a very nice 60 year old African American female who is well known to me.  She presented back in February with cholangiocarcinoma.  She was taken to [facility].  She underwent partial hepatectomy to remove the tumor.  She had positive margins.  She did receive adjuvant chemotherapy and radiation therapy.  She got infusional 5FU with radiation.  We then followed her up.  Unfortunately, she was then noted to have a pulmonary mets.  She has been treated with Gemzar/cisplatin.  Unfortunately, she has developed upper GI bleeding.  She has been expertly handled by Dr. Marina Goodell and his staff.  Dr. Marina Goodell did an initial endoscopy on her back in late November.  He found multiple areas of radiation-induced gastritis.  He did some laser coagulation therapy.  He had a plan for another treatment on December 19.  She came to the office on the 7th.  Her hemoglobin was 6.8.  Her stools were grossly heme positive.  She was readmitted.  She got 3 units of blood today.  She has undergone a repeat endoscopy.  This was done by Dr. [name] on the 8th.  He noted that esophagus and GE junction was normal.  There was severe gastritis noted in the distal gastric body and the antrum of stomach.  There were some superficial ulcerations and oozing erythema. Radiation duodenitis was noted in the bulb and early second portion of duodenum.  There was one small patch of mild gastritis.  It was felt that she would benefit from  twice-a-day PPI inhibitor.  Also Carafate was added.  She was moved to the ICU.  She now is up on 1410.  She feels better. She feels stronger.  She is eating regular food now.  PAST MEDICAL HISTORY:  Her past medical history is remarkable for, 1. Hypertension. 2. Hyperlipidemia. 3. Situational depression.  MEDICATIONS: 1. Neurontin 600 mg p.o. t.i.d. 2. Ativan 0.5 mg q.8 hours p.r.n. 3. Metoprolol XL 100 mg p.o. q.a.m. 4. Prilosec 20 mg p.o. b.i.d. 5. OxyIR 5 mg p.o. q.4 hours p.r.n. 6. Phenergan 12.5 mg p.o. q.6 hours p.r.n. 7. Zocor 40 mg p.o. daily.  ALLERGIES:  SULFA DRUGS.  SOCIAL HISTORY:  Relatively unremarkable.  FAMILY HISTORY:  Remarkable for coronary artery disease.  PHYSICAL EXAMINATION:  GENERAL:  This is a well-developed, well- nourished, black female in no obvious distress. VITAL SIGNS:  Temperature 98.5, pulse 65, respiratory rate 18, blood pressure 124/87. HEENT:  Head exam shows a normocephalic, atraumatic skull.  She has no ocular or oral lesions.  There are no palpable cervical or supraclavicular lymph nodes. LUNGS:  Clear bilaterally. CARDIAC:  Regular rate and rhythm with a normal S1  and S2.  There are no murmurs, rubs, or bruits. ABDOMEN:  Some slight tenderness in the epigastric area.  No fluid wave is noted.  There is no palpable abdominal mass.  No palpable hepatosplenomegaly is appreciated. She has well-healed broad laparotomy wound from a partial hepatectomy. EXTREMITIES:  Shows no clubbing, cyanosis, or edema.  LABORATORY DATA:  Shows white count 6.5, hemoglobin 9.8, hematocrit 29.2, and platelet count is 124.  Sodium 139, potassium 3.8, BUN 8, creatinine 0.83.  Her CA19-9 was 175.7.  Her iron saturation was 13%. Total iron was 33.  Her folate was 10.3.  IMPRESSION:  The patient is a 60 year old African American female with metastatic cholangiocarcinoma.  She has had chemotherapy.  She was responding.  Her CA19-9 was going down.  I note  that the CA19-9 is up again.  Again, we have not been able to treat her probably for over a month.  The GI bleeding has been a more important factor that we have had to control.  I am probably going to have to get her set up with another set of scans. They are willing to do these while she is in the hospital.  I think she does need IV iron.  Her ferritin is elevated because this being an acute-phase reactant related to her underlying malignancy.  We will give her a dose of Feraheme at 1020 mg.  Her folic acid level was low.  I will see about giving her folate.  I do appreciate everybody's help with the patient.  We will certainly follow her along.     Josph Macho, M.D.     PRE/MEDQ  D:  07/22/2011  T:  07/22/2011  Job:  [id number]

## 2011-07-22 NOTE — Progress Notes (Signed)
  Echocardiogram 2D Echocardiogram has been performed.  Jorje Guild [facility] 07/22/2011, 10:11 AM

## 2011-07-22 NOTE — Consult Note (Signed)
[id number] is the dictation number.

## 2011-07-22 NOTE — Progress Notes (Signed)
Subjective: **Having mild abdominal discomfort. No bowel movement for 2 days.*  Objective: Vital signs in last 24 hours: Temp:  [98 F (36.7 C)-98.7 F (37.1 C)] 98.5 F (36.9 C) (12/10 0502) Pulse Rate:  [65-74] 65  (12/10 0502) Resp:  [16-18] 18  (12/10 0502) BP: (111-124)/(72-87) 124/87 mmHg (12/10 0502) SpO2:  [100 %] 100 % (12/10 0502) Weight:  [75.3 kg (166 lb 0.1 oz)] 166 lb 0.1 oz (75.3 kg) (12/09 2130)   General:   Alert,  Well-developed, well-nourished, pleasant and cooperative in NAD Head:  Normocephalic and atraumatic. Eyes:  Sclera clear, no icterus.   Conjunctiva pink. Mouth:  No deformity or lesions, dentition normal. Neck:  Supple; no masses or thyromegaly. Heart:  Regular rate and rhythm; no murmurs, clicks, rubs,  or gallops. Abdomen:  Soft, nontender and nondistended. No masses, hepatosplenomegaly or hernias noted. Normal bowel sounds, without guarding, and without rebound.   Msk:  Symmetrical without gross deformities. Normal posture. Pulses:  Normal pulses noted. Extremities:  Without clubbing or edema. Neurologic:  Alert and  oriented x4;  grossly normal neurologically. Skin:  Intact without significant lesions or rashes. Cervical Nodes:  No significant cervical adenopathy. Psych:  Alert and cooperative. Normal mood and affect.  Intake/Output from previous day: 12/09 0701 - 12/10 0700 In: 370 [P.O.:360; I.V.:10] Out: 1150 [Urine:1150] Intake/Output this shift:    Lab Results:  Basename 07/22/11 0207 07/21/11 0557 07/20/11 2100 07/20/11 1620  WBC 6.5 6.5 -- 5.8  HGB 9.8* 10.5* 10.2* --  HCT 29.2* 30.5* 30.2* --  PLT 124* 124* -- 120*   BMET  Basename 07/22/11 0207 07/21/11 0557 07/20/11 0723  NA 139 139 140  K 3.8 3.7 3.7  CL 107 106 108  CO2 27 28 27   GLUCOSE 95 80 77  BUN 8 7 8   CREATININE 0.83 0.72 0.67  CALCIUM 8.8 9.2 8.5   LFT  Basename 07/19/11 1250  PROT 5.6*  ALBUMIN 2.7*  AST 19  ALT 12  ALKPHOS 69  BILITOT 0.2*  BILIDIR  --  IBILI --   PT/INR  Basename 07/19/11 2130  LABPROT 15.1  INR 1.17   Hepatitis Panel No results found for this basename: HEPBSAG,HCVAB,HEPAIGM,HEPBIGM in the last 72 hours   Studies/Results: No results found.  Assessment: *Acute GI bleeding secondary to radiation-induced gastritis. This appears to be resolving.  Medications #1 outpatient followup for repeat endoscopy. #2 continue twice a day PPI and Carafate**         Greig Altergott D. Arlyce Dice, MD, [facility] [phone]   Melvia Heaps  07/22/2011, 9:20 AM

## 2011-07-22 NOTE — Progress Notes (Signed)
I have seen and assessed the patient and I agree with Sherry Smothers, NP assessment and plan. Patient states she feels much better. Follow H&H.

## 2011-07-23 ENCOUNTER — Inpatient Hospital Stay (HOSPITAL_COMMUNITY): Payer: Medicare Other

## 2011-07-23 LAB — CBC
Hemoglobin: 10.7 g/dL — ABNORMAL LOW (ref 12.0–15.0)
MCH: 30.7 pg (ref 26.0–34.0)
MCHC: 32.7 g/dL (ref 30.0–36.0)

## 2011-07-23 MED ORDER — IOHEXOL 300 MG/ML  SOLN
100.0000 mL | Freq: Once | INTRAMUSCULAR | Status: AC | PRN
  Administered 2011-07-23: 100 mL via INTRAVENOUS

## 2011-07-23 NOTE — Progress Notes (Signed)
[name] is feeling better.  There is still some abdominal discomfort.  She did receive iron yesterday.  I gave her dose of Feraheme at 1020 mg. She tolerated this well.  She is still on the monitor.  She might be able to go off monitor. There have been no changes with respect to her cardiac status.  She has had a normal sinus rhythm on the monitor.  She is eating.  She is not having diarrhea.  PHYSICAL EXAMINATION:  This is a well-developed, well-nourished, black female, in no obvious distress.  Vital signs:  Temperature of 98.4, pulse 69, respiratory rate 18, blood pressure 141/88.  Head and neck: Exam shows a normocephalic, atraumatic skull.  There is no scleral icterus.  There is no adenopathy in her neck.  Lungs:  Clear to percussion and auscultation bilaterally.  Cardiac:  Regular rate and rhythm with normal S1, S2.  No murmurs, rubs, or bruits.  Abdomen:  Soft with good bowel sounds.  There is no palpable abdominal mass.  She has a laparotomy wound from her past surgery.  There is no fluid wave.  There is no palpable hepatosplenomegaly.  Extremities:  No clubbing, cyanosis, or edema.  LABORATORY STUDIES:  White cell count of 5.3, hemoglobin 10.7, hematocrit 32.7, platelet count 120.  [name] is relatively stable right now.  While in the hospital, I do want to see about getting a CT scan set up for her to help re-stage her. Again, her CA-19.9 is going back up.  I want to see if we can have any radiographic evidence of progressive disease.  She hopefully can be taken off cardiac monitor.  Hopefully she will be able to be discharged to home in the next day or so.    ______________________________ Josph Macho, M.D. PRE/MEDQ  D:  07/23/2011  T:  07/23/2011  Job:  [id number]

## 2011-07-23 NOTE — Progress Notes (Signed)
I have seen and assessed patient and agree with Karen Black,NP assessment and plan. 

## 2011-07-23 NOTE — Progress Notes (Signed)
Subjective: Sitting up in bed watching TV. Reports feeling better. Only slight abdominal discomfort  Objective: Vital signs Filed Vitals:   07/22/11 1353 07/22/11 2113 07/22/11 2300 07/23/11 0548  BP: 100/67 116/79 121/75 141/88  Pulse: 72 74 79 69  Temp: 98.7 F (37.1 C)  98.6 F (37 C) 98.4 F (36.9 C)  TempSrc: Oral     Resp: 16  17 18   Height:      Weight:      SpO2: 99%  98% 98%   Weight change:     Intake/Output from previous day: 12/10 0701 - 12/11 0700 In: 984 [P.O.:840; I.V.:10; IV Piggyback:134] Out: 2075 [Urine:2075] Total I/O In: 360 [P.O.:360] Out: 450 [Urine:450]   Physical Exam: General: Alert, awake, oriented x3, in no acute distress. HEENT: No bruits, no goiter. Mucus membrane moist/pink. PERRL Poor dentition  Heart: Regular rate and rhythm, without murmurs, rubs, gallops.  Lungs: Normal effort. Breath sounds clear to auscultation bilaterally.No wheeze, rhonchi, rales  Abdomen:Obese, Soft, nondistended, positive bowel sounds. Mild tenderness to palpation throughout. No mass organmegaly noted  Extremities: No clubbing cyanosis or edema with positive pedal pulses.  Neuro: Grossly intact, nonfocal. Speech clear, facial symetry       Lab Results: Basic Metabolic Panel:  Basename 07/22/11 0207 07/21/11 0557  NA 139 139  K 3.8 3.7  CL 107 106  CO2 27 28  GLUCOSE 95 80  BUN 8 7  CREATININE 0.83 0.72  CALCIUM 8.8 9.2  MG -- --  PHOS -- --   Liver Function Tests: No results found for this basename: AST:2,ALT:2,ALKPHOS:2,BILITOT:2,PROT:2,ALBUMIN:2 in the last 72 hours No results found for this basename: LIPASE:2,AMYLASE:2 in the last 72 hours No results found for this basename: AMMONIA:2 in the last 72 hours CBC:  Basename 07/23/11 0510 07/22/11 0207  WBC 5.3 6.5  NEUTROABS -- --  HGB 10.7* 9.8*  HCT 32.7* 29.2*  MCV 94.0 91.5  PLT 120* 124*   Cardiac Enzymes:  Basename 07/22/11 0207 07/21/11 1549 07/21/11 0801  CKTOTAL 18 20 17     CKMB 1.5 1.2 1.4  CKMBINDEX -- -- --  TROPONINI <0.30 <0.30 <0.30   BNP: No components found with this basename: POCBNP:3 D-Dimer: No results found for this basename: DDIMER:2 in the last 72 hours CBG: No results found for this basename: GLUCAP:6 in the last 72 hours Hemoglobin A1C: No results found for this basename: HGBA1C in the last 72 hours Fasting Lipid Panel: No results found for this basename: CHOL,HDL,LDLCALC,TRIG,CHOLHDL,LDLDIRECT in the last 72 hours Thyroid Function Tests: No results found for this basename: TSH,T4TOTAL,FREET4,T3FREE,THYROIDAB in the last 72 hours Anemia Panel: No results found for this basename: VITAMINB12,FOLATE,FERRITIN,TIBC,IRON,RETICCTPCT in the last 72 hours Coagulation: No results found for this basename: LABPROT:2,INR:2 in the last 72 hours Urine Drug Screen: Drugs of Abuse     Component Value Date/Time   LABOPIA POSITIVE* 09/19/2010 2359   COCAINSCRNUR NONE DETECTED 09/19/2010 2359   LABBENZ NONE DETECTED 09/19/2010 2359   AMPHETMU NONE DETECTED 09/19/2010 2359   THCU NONE DETECTED 09/19/2010 2359   LABBARB  Value: NONE DETECTED        DRUG SCREEN FOR MEDICAL PURPOSES ONLY.  IF CONFIRMATION IS NEEDED FOR ANY PURPOSE, NOTIFY LAB WITHIN 5 DAYS.        LOWEST DETECTABLE LIMITS FOR URINE DRUG SCREEN Drug Class       Cutoff (ng/mL) Amphetamine      1000 Barbiturate      200 Benzodiazepine   200 Tricyclics  300 Opiates          300 Cocaine          300 THC              50 09/19/2010 2359    Alcohol Level: No results found for this basename: ETH:2 in the last 72 hours Urinalysis:  Misc. Labs:  Recent Results (from the past 240 hour(s))  URINE CULTURE     Status: Normal   Collection Time   07/19/11  1:48 PM      Component Value Range Status Comment   Specimen Description URINE, CLEAN CATCH   Final    Special Requests NONE   Final    Setup Time [id number]   Final    Colony Count 4,000 COLONIES/ML   Final    Culture INSIGNIFICANT GROWTH   Final     Report Status 07/21/2011 FINAL   Final   MRSA PCR SCREENING     Status: Normal   Collection Time   07/20/11  3:16 AM      Component Value Range Status Comment   MRSA by PCR NEGATIVE  NEGATIVE  Final     Studies/Results: No results found.  Medications: Scheduled Meds:   . benzonatate  100 mg Oral TID  . folic acid  1 mg Oral Daily  . gabapentin  600 mg Oral TID  . loratadine  10 mg Oral Daily  . metoprolol tartrate  25 mg Oral BID  . pantoprazole  40 mg Oral BID AC  . pseudoephedrine  120 mg Oral BID  . simvastatin  40 mg Oral QHS  . sodium chloride  10 mL Intracatheter Q12H  . sucralfate  1 g Oral Q6H  . DISCONTD: metoprolol tartrate  25 mg Oral TID   Continuous Infusions:  PRN Meds:.albuterol, diphenhydrAMINE, guaiFENesin-dextromethorphan, HYDROcodone-acetaminophen, iohexol, LORazepam, naphazoline-glycerin, nitroGLYCERIN, ondansetron (ZOFRAN) IV, ondansetron, promethazine, sodium chloride  Assessment/Plan:  Principal Problem: 1. 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 Patient s/p 3 units PRBC. Hgb 10 up from 9  yesterday. Iron given 12/10 per heme/onc for Iron level 33. S/P EGD with severe radiation gastritis with oozing in distal gastric body and antrum, with areas of ulceration. PPI changed to BID. Continue carafate, Serial CBC. Patient on chronic steriods. GI ff and appreciate input and rxcs. OP repeat EGD scheduled  2.Hx Metastatic cholangiocarcinoma - s/p liver resection. Chemo on hold. Continue decadron. Per hem/onc. Repeat CT abd/pelvis chest to evaluate CA. Results pending 3. HTN- BP more stable today. SBP 144 from 100 yesterday. Lopressor changed to 25mg  BID yesterday which is a little lower dose than original. Will monitor on current dose and consider restarting her home toprol tomorrow depending on BP trends on current dose.   4. Abn EKG - likely secondary to #1. EKG NSR. CE neg. Patient currently asymptomatic. Tele SR. Will discontinue  tele. 4. Genella Rife- PPI Follow up EGD scheduled 12/19  5.Upper resp sxs/congestion- influenza A and B PCR negative. Symptomatic treatment. PT feeling better  6. Hyperlipidemia- zocor  7. Prophylaxis- PPI for GI, SCDs for DVT.  8. dispo likely home 24-48hrs per heme/onc       LOS: 4 days   [facility] M 07/23/2011, 10:40 AM

## 2011-07-23 NOTE — Progress Notes (Signed)
This office note has been dictated.

## 2011-07-24 ENCOUNTER — Other Ambulatory Visit: Payer: Medicare Other | Admitting: Lab

## 2011-07-24 LAB — CBC
HCT: 31 % — ABNORMAL LOW (ref 36.0–46.0)
Hemoglobin: 10 g/dL — ABNORMAL LOW (ref 12.0–15.0)
MCHC: 32.3 g/dL (ref 30.0–36.0)
RBC: 3.27 MIL/uL — ABNORMAL LOW (ref 3.87–5.11)
WBC: 5.4 10*3/uL (ref 4.0–10.5)

## 2011-07-24 MED ORDER — LORATADINE 10 MG PO TABS: 10.0000 mg | ORAL_TABLET | Freq: Every day | ORAL | Status: DC

## 2011-07-24 MED ORDER — HEPARIN SOD (PORK) LOCK FLUSH 100 UNIT/ML IV SOLN
500.0000 [IU] | INTRAVENOUS | Status: AC | PRN
  Administered 2011-07-24: 500 [IU]

## 2011-07-24 MED ORDER — FOLIC ACID 1 MG PO TABS: 1.0000 mg | ORAL_TABLET | Freq: Every day | ORAL | Status: AC

## 2011-07-24 MED ORDER — SUCRALFATE 1 GM/10ML PO SUSP: 1.0000 g | Freq: Four times a day (QID) | ORAL | Status: DC

## 2011-07-24 MED ORDER — PANTOPRAZOLE SODIUM 40 MG PO TBEC: 40.0000 mg | DELAYED_RELEASE_TABLET | Freq: Two times a day (BID) | ORAL | Status: DC

## 2011-07-24 NOTE — Progress Notes (Signed)
[name] is doing okay today.  She is off telemetry now.  She has not had any problems with obvious bleeding.  She has not had a bowel movement in a couple days.  She did have a CT scan done yesterday.  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  The CT scan, thankfully, did not show any evidence of progressive disease.  She has a stable less than 1 cm pulmonary nodules.  There are no new or enlarging nodules.  There is increased size of a small right effusion.  There is no hilar or mediastinal adenopathy.  No bone lesion was noted.  Within the abdomen pelvis, no liver masses were noted.  Spleen, pancreas and kidneys were all normal.  No lymphadenopathy is noted.  She has a stable 2 x 3 cm left adrenal lesion consistent with an adrenal adenoma.  No ascites is appreciated.  She is eating okay.  There is no nausea and vomiting.  PHYSICAL EXAMINATION:  This is a well-developed, well-nourished black female in no obvious distress.  Vital signs:  Temperature of 98.5, pulse 69, respiratory rate 20, blood pressure 140/88.  Head and neck:  Shows no ocular or oral lesions.  There is no scleral icterus.  There is no adenopathy in her neck.  Lungs:  Clear bilaterally.  There are no rales, wheezes, or rhonchi.  Cardiac:  Regular rate and rhythm with a normal S1 and S2.  There are no murmurs, rubs, or bruits.  Abdomen:  Soft with good bowel sounds.  She has well-healed laparotomy scar.  There is no guarding or rebound tenderness.  There is no palpable hepatosplenomegaly.  Extremities:  No clubbing, cyanosis, or edema. Neurologic:  No focal neurological deficit.  LABORATORY STUDIES:  White cell count 5.4, hemoglobin 10 hematocrit 31, platelet count 120.  From my point of view, Ms. Hosmer could certainly go home.  I do not see any evidence of a progressive disease.  Her hemoglobin is holding relatively stable.  We will probably have to get her back in a couple of weeks for  followup.  She is still due to see Dr., I think Marina Goodell for another endoscopy and laser coagulation for the GI bleeding.  She definitely needs folic acid as an outpatient.  I will make sure she is on 1 mg a day.  She does not need any oral iron.  She is not going to absorb oral iron.  She did get IV iron while in the hospital and this should be helpful to help maintain her hemoglobin a little bit better.  Again, we will follow up with Ms. Rauh in the [facility] in a couple of weeks.  Although she does not have any obvious progressive disease, we are going to have to continue to treat her to try to prevent complications.    ______________________________ Josph Macho, M.D. PRE/MEDQ  D:  07/24/2011  T:  07/24/2011  Job:  [id number]

## 2011-07-24 NOTE — Discharge Summary (Signed)
Physician Discharge Summary  Patient ID: [name] MRN: [id number] DOB/AGE: [birth year] 60 y.o.  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 Qty: 30 tablet, Refills: 0   Associated Diagnoses: Anemia associated with acute blood loss    loratadine (CLARITIN) 10 MG tablet Take 1 tablet (10 mg total) by mouth daily. Qty: 30 tablet, Refills: 0    sucralfate (CARAFATE) 1 GM/10ML suspension Take 10 mLs (1 g total) by mouth every 6 (six) hours. Qty: 420 mL, Refills: 0      CONTINUE these medications which have NOT CHANGED   Details  dexamethasone (DECADRON) 4 MG tablet Take 4-8 mg by mouth daily with breakfast. Take 2 tabs by mouth days 1 and 3 of chemo cycle, then 1 tab by mouth days 4 and 5 of each cycle of chemo    gabapentin (NEURONTIN) 300 MG capsule Take 2 capsules (600 mg total) by mouth 3 (three) times daily. Qty: 180 capsule, Refills: 3    LORazepam (ATIVAN) 0.5 MG tablet Take 0.5 mg by mouth every 8 (eight) hours.      metoprolol (TOPROL-XL) 100 MG 24 hr tablet Take 1 tablet (100 mg total) by mouth daily. Qty: 90 tablet, Refills: 3   Associated Diagnoses: Hypertension    Omeprazole 20 MG TBEC Take 1 tablet (20 mg total) by mouth 2 (two) times daily. Qty: 180 each, Refills: 3   Associated Diagnoses: Gastroesophageal reflux disease    oxycodone (OXY-IR) 5 MG capsule Take 5 mg by mouth every 4 (four) hours as needed. For  pain    promethazine (PHENERGAN) 12.5 MG tablet Take 12.5 mg by mouth every 6 (six) hours as needed. For nausea    simvastatin (ZOCOR) 40 MG tablet Take 1 tablet (40 mg total) by mouth at bedtime. Qty: 31 tablet, Refills: 1    nitroGLYCERIN (NITROSTAT) 0.4 MG SL tablet Place 1 tablet (0.4 mg total) under the tongue every 5 (five) minutes as needed. Up to 3 times for chest pain Qty: 10 tablet, Refills: 1         Disposition and Follow-up: Pt medically stable and ready for discharge to home. Will follow up with Dr. Marina Goodell for repeat endo 12/19. Has follow up with PCP Dr. Dorise Hiss 12/20.  Consults:  GI                     Heme/Onc  Physical exam: General: Alert, awake, oriented x3, in no acute distress.  HEENT: No bruits, no goiter. Mucus membrane moist/pink. PERRL Poor dentition  Heart: Regular rate and rhythm, without murmurs, rubs, gallops.  Lungs: Normal effort. Breath sounds clear to auscultation bilaterally.No wheeze, rhonchi, rales  Abdomen:Obese, Soft, nondistended, positive bowel sounds. Mild tenderness to palpation throughout. No mass organmegaly noted  Extremities: No clubbing cyanosis or edema with positive pedal pulses.  Neuro: Grossly intact, nonfocal. Speech clear, facial symetry       Significant Diagnostic Studies:  Dg Chest 2 View  07/19/2011  *RADIOLOGY REPORT*  Clinical Data: Upper  GI bleed.Hypertension.  Bile duct carcinoma.  CHEST - 2 VIEW  Comparison: 11/11/2010  Findings: Right Port-A-Cath is in place with the tip in the SVC. No pneumothorax.  Heart is mildly enlarged.  Blunting of the right costophrenic angle compatible with small effusion, significantly improved since prior study. No focal right airspace opacity.  Left lung is clear.  No acute bony abnormality.  IMPRESSION: Small right effusion, much improved since prior study.  Mild cardiomegaly.  Original Report Authenticated By: Cyndie Chime, M.D.   Portable Chest 1 View  07/20/2011  *RADIOLOGY REPORT*   Clinical Data: Cough.  PORTABLE CHEST - 1 VIEW  Comparison: PA and lateral chest 07/19/2011.  Findings: Port-A-Cath is in place.  Lungs are clear.  Heart size is mildly enlarged.  No pneumothorax.  Small right effusion seen on the prior study appears improved. No pneumothorax.  IMPRESSION: Improved small right pleural effusion.  Lungs appear clear.  Original Report Authenticated By: Bernadene Bell. Maricela Curet, M.D.    Labs Reviewed  CBC - Abnormal; Notable for the following:    RBC 1.81 (*)    Hemoglobin 5.9 (*)    HCT 18.6 (*)    MCV 102.8 (*)    RDW 19.1 (*)    Platelets 114 (*)    All other components within normal limits  DIFFERENTIAL - Abnormal; Notable for the following:    Lymphocytes Relative 7 (*)    Monocytes Relative 17 (*)    Lymphs Abs 0.5 (*)    Monocytes Absolute 1.2 (*)    All other components within normal limits  COMPREHENSIVE METABOLIC PANEL - Abnormal; Notable for the following:    Total Protein 5.6 (*)    Albumin 2.7 (*)    Total Bilirubin 0.2 (*)    All other components within normal limits  LIPASE, BLOOD - Abnormal; Notable for the following:    Lipase 10 (*)    All other components within normal limits  IRON AND TIBC - Abnormal; Notable for the following:    Iron 33 (*)    Saturation Ratios 13 (*)    All other components within normal limits  FERRITIN - Abnormal; Notable for the following:    Ferritin 707 (*)    All other components within normal limits  RETICULOCYTES - Abnormal; Notable for the following:    Retic Ct Pct 10.0 (*)    RBC. 1.83 (*)    All other components within normal limits  CBC - Abnormal; Notable for the following:    RBC 3.17 (*)    Hemoglobin 9.8 (*)    HCT 28.8 (*)    RDW 20.4 (*)    Platelets 105 (*)    All other components within normal limits  CBC - Abnormal; Notable for the following:    RBC 3.42 (*)    Hemoglobin 10.5 (*)    HCT 30.9 (*)    RDW 20.5 (*)    Platelets 120 (*)    All other components within normal limits    CBC - Abnormal; Notable for the following:    RBC 3.38 (*)    Hemoglobin 10.5 (*)    HCT 30.5 (*)    RDW 20.1 (*)    Platelets 124 (*)    All other components within normal limits  HEMOGLOBIN AND HEMATOCRIT, BLOOD - Abnormal; Notable for the following:    Hemoglobin 10.2 (*)    HCT 30.2 (*)    All other components within normal limits  CBC - Abnormal; Notable  for the following:    RBC 3.19 (*)    Hemoglobin 9.8 (*)    HCT 29.2 (*)    RDW 19.6 (*)    Platelets 124 (*)    All other components within normal limits  BASIC METABOLIC PANEL - Abnormal; Notable for the following:    GFR calc non Af Amer 76 (*)    GFR calc Af Amer 88 (*)    All other components within normal limits  CBC - Abnormal; Notable for the following:    RBC 3.48 (*)    Hemoglobin 10.7 (*)    HCT 32.7 (*)    RDW 19.1 (*)    Platelets 120 (*)    All other components within normal limits  CBC - Abnormal; Notable for the following:    RBC 3.27 (*)    Hemoglobin 10.0 (*)    HCT 31.0 (*)    RDW 18.7 (*)    Platelets 120 (*)    All other components within normal limits  URINALYSIS, ROUTINE W REFLEX MICROSCOPIC  URINE CULTURE  OCCULT BLOOD X 1 CARD TO LAB, STOOL  VITAMIN B12  FOLATE  BASIC METABOLIC PANEL  PROTIME-INR  URINALYSIS, ROUTINE W REFLEX MICROSCOPIC  PREPARE RBC (CROSSMATCH)  TYPE AND SCREEN  MRSA PCR SCREENING  PREPARE RBC (CROSSMATCH)  BASIC METABOLIC PANEL  INFLUENZA PANEL BY PCR  CARDIAC PANEL(CRET KIN+CKTOT+MB+TROPI)  CARDIAC PANEL(CRET KIN+CKTOT+MB+TROPI)  CARDIAC PANEL(CRET KIN+CKTOT+MB+TROPI)     Procedure(s): ESOPHAGOGASTRODUODENOSCOPY (EGD)   Dg Chest 2 View  07/19/2011  *RADIOLOGY REPORT*  Clinical Data: Upper GI bleed.Hypertension.  Bile duct carcinoma.  CHEST - 2 VIEW  Comparison: 11/11/2010  Findings: Right Port-A-Cath is in place with the tip in the SVC. No pneumothorax.  Heart is mildly enlarged.  Blunting of the right costophrenic angle compatible with small effusion,  significantly improved since prior study. No focal right airspace opacity.  Left lung is clear.  No acute bony abnormality.  IMPRESSION: Small right effusion, much improved since prior study.  Mild cardiomegaly.  Original Report Authenticated By: Cyndie Chime, M.D.   Ct Chest W Contrast  07/23/2011  *RADIOLOGY REPORT*  Clinical Data:  Follow-up cholangiocarcinoma.  Ongoing chemotherapy.  Previous surgery.  CT CHEST, ABDOMEN AND PELVIS WITH CONTRAST  Technique:  Multidetector CT imaging of the chest, abdomen and pelvis was performed following the standard protocol during bolus administration of intravenous contrast.  Contrast: OMNIPAQUE IOHEXOL 300 MG/ML IV SOLN  Comparison:  05/13/2011  CT CHEST  Findings:  Multiple small less than 1 cm bilateral pulmonary metastases show no significant change compared to prior study.  No new or enlarging pulmonary metastases are identified.  Increased size of small right pleural effusion is noted, although there is no evidence of pleural or chest wall soft tissue mass.  No evidence of hilar or mediastinal lymphadenopathy.  No central endobronchial lesion identified.  No suspicious bone lesions are identified.  IMPRESSION:  1.  Stable bilateral pulmonary metastases. 2.  Increased size of small right pleural effusion. 3.  No evidence of lymphadenopathy.  CT ABDOMEN AND PELVIS  Findings:  Postop changes from previous right hepatectomy are stable.  No liver masses are identified in the residual left hepatic lobe.  Mild wall thickening of the distal stomach is stable.  No lymphadenopathy seen in the right upper quadrant or elsewhere within the abdomen or pelvis.  Uterus and adnexa are unremarkable.  The spleen, pancreas, and kidneys are normal in appearance.  A 2 x 3 cm  left adrenal mass is stable, and consistent with an adrenal adenoma.  The  There is no evidence of inflammatory process or abnormal fluid collections within the abdomen or pelvis.  No evidence of bowel wall  thickening or dilatation.  No suspicious bone lesions are identified.  IMPRESSION:  1.  Stable postop changes from prior right hepatectomy and left adrenal adenoma.  2.  No acute findings.  No evidence of recurrent or metastatic carcinoma.  Original Report Authenticated By: Danae Orleans, M.D.   Ct Abdomen Pelvis W Contrast  07/23/2011  *RADIOLOGY REPORT*  Clinical Data:  Follow-up cholangiocarcinoma.  Ongoing chemotherapy.  Previous surgery.  CT CHEST, ABDOMEN AND PELVIS WITH CONTRAST  Technique:  Multidetector CT imaging of the chest, abdomen and pelvis was performed following the standard protocol during bolus administration of intravenous contrast.  Contrast: OMNIPAQUE IOHEXOL 300 MG/ML IV SOLN  Comparison:  05/13/2011  CT CHEST  Findings:  Multiple small less than 1 cm bilateral pulmonary metastases show no significant change compared to prior study.  No new or enlarging pulmonary metastases are identified.  Increased size of small right pleural effusion is noted, although there is no evidence of pleural or chest wall soft tissue mass.  No evidence of hilar or mediastinal lymphadenopathy.  No central endobronchial lesion identified.  No suspicious bone lesions are identified.  IMPRESSION:  1.  Stable bilateral pulmonary metastases. 2.  Increased size of small right pleural effusion. 3.  No evidence of lymphadenopathy.  CT ABDOMEN AND PELVIS  Findings:  Postop changes from previous right hepatectomy are stable.  No liver masses are identified in the residual left hepatic lobe.  Mild wall thickening of the distal stomach is stable.  No lymphadenopathy seen in the right upper quadrant or elsewhere within the abdomen or pelvis.  Uterus and adnexa are unremarkable.  The spleen, pancreas, and kidneys are normal in appearance.  A 2 x 3 cm left adrenal mass is stable, and consistent with an adrenal adenoma.  The  There is no evidence of inflammatory process or abnormal fluid collections within the abdomen or  pelvis.  No evidence of bowel wall thickening or dilatation.  No suspicious bone lesions are identified.  IMPRESSION:  1.  Stable postop changes from prior right hepatectomy and left adrenal adenoma.  2.  No acute findings.  No evidence of recurrent or metastatic carcinoma.  Original Report Authenticated By: Danae Orleans, M.D.   Portable Chest 1 View  07/20/2011  *RADIOLOGY REPORT*  Clinical Data: Cough.  PORTABLE CHEST - 1 VIEW  Comparison: PA and lateral chest 07/19/2011.  Findings: Port-A-Cath is in place.  Lungs are clear.  Heart size is mildly enlarged.  No pneumothorax.  Small right effusion seen on the prior study appears improved. No pneumothorax.  IMPRESSION: Improved small right pleural effusion.  Lungs appear clear.  Original Report Authenticated By: Bernadene Bell. D'ALESSIO, M.D.       Brief H and P: For complete details please refer to admission H and P, but in brief   [name] is a 60 y.o. female, with above dictated past medical and surgical history noticeably of cholangiocarcinoma, status post radiation and ongoing chemotherapy, who has had problems with upper GI bleed due to radiation and chemotherapy-induced gastritis and duodenitis, she was recently admitted and underwent an EGD and 2 unit of packed RBC transfusion with some improvement in her H&H , presents again with ongoing dark tarry stools and diffuse weakness, she was seen at med center  [facility] this morning from where my partner Dr. Janee Morn accepted her in admission as it was determined her HG 6.1 Patient has now arrived to our facility a few minutes ago, and I was asked as to see the patient .  Patient currently is relatively symptom-free except for complaining of dark tarry stools she denies any chest pain denies any dizziness denies any abdominal pain or discomfort she has a rather flat affect.      Hospital Course:  No resolved problems to display.  Active Hospital Problems  Diagnoses Date Noted   . Anemia  associated with acute blood loss 07/03/2011   . Abnormal EKG 07/21/2011   . Congestion 07/20/2011   . Cholangiocarcinoma 07/20/2011   . Gastro-Duodenitis from Chemo radiation on EGD casing UGI Bleed 07/19/2011   . Iron deficiency anemia secondary to blood loss (chronic) 07/11/2011   . Cancer, biliary, other site 06/14/2011   . DEPRESSION 09/08/2006   . HYPERLIPIDEMIA 05/26/2006   . HYPERTENSION 05/26/2006   . GERD 05/26/2006     Resolved Hospital Problems  Diagnoses Date Noted Date Resolved    Principal Problem: 1.Symptomatic Anemia- Secondary to radiation/chemo induced gastritis. Pt admitted to tele Hg 6.1. Serial H&H and repeat endo per GI on 12/8.  Patient s/p 3 units PRBC.  In addition pt. Seen by Dr. Myna Hidalgo and  Iron given 12/10 for Iron level 33. S/P EGD with severe radiation gastritis with oozing in distal gastric body and antrum, with areas of ulceration. PPI changed to BID. Continue carafate. At time of discharge Hg. stable at 10.0. No BM in 2 days no signs bleeding. Pt has follow up apt with GI for repeat EGD on 12/19 2.Hx Metastatic cholangiocarcinoma - s/p liver resection. Chemo on hold. Pt seen by Dr. Myna Hidalgo. Her decedron was continued . Per hem/onc. Repeat CT abd/pelvis chest to evaluate CA. Results yielded no evidence of new/progressing disease. Dr. Gustavo Lah office to call pt. For scheduling of chemo once EGD complete  3. HTN- Initially BP soft secondary to #1. Pt given fluid and 3 units PRBC's. At time of discharge patient BP had stabalized and home Toprol resumed  4. Abn EKG - likely secondary to #1. EKG NSR. CE neg. Patient remained asymptomatic during this hospitalization. She was monitored on Tele that yielded SR.   4. Genella Rife- PPI Follow up EGD scheduled 12/19  5.Upper resp sxs/congestion- influenza A and B PCR negative. Symptomatic treatment. Will continue claritin at discharge  6. Hyperlipidemia- zocor     Time spent on Discharge: 40 minutes  Signed: Gwenyth Bender 07/24/2011, 11:47 AM  Patient seen and examined. Discussed with NP Toya Smothers. Agree with assessment and plan to D/C home and follow up with Dr. Marina Goodell (GI) in one week for EGD.

## 2011-07-24 NOTE — Progress Notes (Signed)
This office note has been dictated.

## 2011-07-26 ENCOUNTER — Other Ambulatory Visit: Payer: Self-pay | Admitting: Hematology & Oncology

## 2011-07-26 ENCOUNTER — Other Ambulatory Visit (HOSPITAL_BASED_OUTPATIENT_CLINIC_OR_DEPARTMENT_OTHER): Payer: Medicare Other | Admitting: Lab

## 2011-07-26 DIAGNOSIS — D500 Iron deficiency anemia secondary to blood loss (chronic): Secondary | ICD-10-CM

## 2011-07-26 LAB — CBC WITH DIFFERENTIAL (CANCER CENTER ONLY)
BASO#: 0 10*3/uL (ref 0.0–0.2)
Eosinophils Absolute: 0.1 10*3/uL (ref 0.0–0.5)
HCT: 36.4 % (ref 34.8–46.6)
HGB: 11.8 g/dL (ref 11.6–15.9)
LYMPH%: 8 % — ABNORMAL LOW (ref 14.0–48.0)
MCH: 31.5 pg (ref 26.0–34.0)
MCV: 97 fL (ref 81–101)
MONO#: 0.9 10*3/uL (ref 0.1–0.9)
MONO%: 10.6 % (ref 0.0–13.0)
Platelets: 136 10*3/uL — ABNORMAL LOW (ref 145–400)
RBC: 3.75 10*6/uL (ref 3.70–5.32)
WBC: 8.8 10*3/uL (ref 3.9–10.0)

## 2011-07-29 ENCOUNTER — Encounter (HOSPITAL_COMMUNITY): Payer: Self-pay | Admitting: Internal Medicine

## 2011-07-31 ENCOUNTER — Encounter (HOSPITAL_COMMUNITY): Payer: Self-pay

## 2011-07-31 ENCOUNTER — Other Ambulatory Visit: Payer: Medicare Other | Admitting: Lab

## 2011-07-31 ENCOUNTER — Ambulatory Visit (HOSPITAL_COMMUNITY)
Admission: RE | Admit: 2011-07-31 | Discharge: 2011-07-31 | Disposition: A | Discharge status: Home / Self Care | Payer: Medicare Other | Source: Ambulatory Visit | Attending: Internal Medicine | Admitting: Internal Medicine

## 2011-07-31 ENCOUNTER — Encounter (HOSPITAL_COMMUNITY): Admission: RE | Disposition: A | Discharge status: Home / Self Care | Payer: Self-pay | Source: Ambulatory Visit

## 2011-07-31 DIAGNOSIS — K2980 Duodenitis without bleeding: Secondary | ICD-10-CM | POA: Insufficient documentation

## 2011-07-31 DIAGNOSIS — E785 Hyperlipidemia, unspecified: Secondary | ICD-10-CM | POA: Insufficient documentation

## 2011-07-31 DIAGNOSIS — D500 Iron deficiency anemia secondary to blood loss (chronic): Secondary | ICD-10-CM | POA: Insufficient documentation

## 2011-07-31 DIAGNOSIS — C240 Malignant neoplasm of extrahepatic bile duct: Secondary | ICD-10-CM | POA: Insufficient documentation

## 2011-07-31 DIAGNOSIS — Y842 Radiological procedure and radiotherapy as the cause of abnormal reaction of the patient, or of later complication, without mention of misadventure at the time of the procedure: Secondary | ICD-10-CM | POA: Insufficient documentation

## 2011-07-31 DIAGNOSIS — K219 Gastro-esophageal reflux disease without esophagitis: Secondary | ICD-10-CM | POA: Insufficient documentation

## 2011-07-31 DIAGNOSIS — Z79899 Other long term (current) drug therapy: Secondary | ICD-10-CM | POA: Insufficient documentation

## 2011-07-31 DIAGNOSIS — K921 Melena: Secondary | ICD-10-CM | POA: Insufficient documentation

## 2011-07-31 DIAGNOSIS — K2901 Acute gastritis with bleeding: Secondary | ICD-10-CM

## 2011-07-31 DIAGNOSIS — K2961 Other gastritis with bleeding: Secondary | ICD-10-CM | POA: Insufficient documentation

## 2011-07-31 DIAGNOSIS — I2510 Atherosclerotic heart disease of native coronary artery without angina pectoris: Secondary | ICD-10-CM | POA: Insufficient documentation

## 2011-07-31 DIAGNOSIS — I10 Essential (primary) hypertension: Secondary | ICD-10-CM | POA: Insufficient documentation

## 2011-07-31 HISTORY — DX: Cardiac arrhythmia, unspecified: I49.9

## 2011-07-31 HISTORY — DX: Headache: R51

## 2011-07-31 HISTORY — DX: Unspecified osteoarthritis, unspecified site: M19.90

## 2011-07-31 HISTORY — DX: Anemia, unspecified: D64.9

## 2011-07-31 HISTORY — DX: Peripheral vascular disease, unspecified: I73.9

## 2011-07-31 HISTORY — DX: Acute myocardial infarction, unspecified: I21.9

## 2011-07-31 HISTORY — PX: ESOPHAGOGASTRODUODENOSCOPY: SHX5428

## 2011-07-31 HISTORY — DX: Angina pectoris, unspecified: I20.9

## 2011-07-31 SURGERY — EGD (ESOPHAGOGASTRODUODENOSCOPY)
Anesthesia: Moderate Sedation

## 2011-07-31 MED ORDER — MIDAZOLAM HCL 10 MG/2ML IJ SOLN
INTRAMUSCULAR | Status: DC | PRN
  Administered 2011-07-31 (×4): 2 mg via INTRAVENOUS

## 2011-07-31 MED ORDER — FENTANYL NICU IV SYRINGE 50 MCG/ML
INJECTION | INTRAMUSCULAR | Status: DC | PRN
  Administered 2011-07-31 (×4): 25 ug via INTRAVENOUS

## 2011-07-31 MED ORDER — BUTAMBEN-TETRACAINE-BENZOCAINE 2-2-14 % EX AERO
INHALATION_SPRAY | CUTANEOUS | Status: DC | PRN
  Administered 2011-07-31: 2 via TOPICAL

## 2011-07-31 MED ORDER — SODIUM CHLORIDE 0.9 % IV SOLN
Freq: Once | INTRAVENOUS | Status: AC
  Administered 2011-07-31: 500 mL via INTRAVENOUS

## 2011-07-31 MED ORDER — FENTANYL CITRATE 0.05 MG/ML IJ SOLN
INTRAMUSCULAR | Status: AC
  Filled 2011-07-31: qty 2

## 2011-07-31 MED ORDER — MIDAZOLAM HCL 10 MG/2ML IJ SOLN
INTRAMUSCULAR | Status: AC
  Filled 2011-07-31: qty 2

## 2011-07-31 NOTE — H&P (View-Only) (Signed)
[name] CSN:[id number],MRN:[id number]  Outpatient Primary MD for the patient is Genella Mech, MD, MD  With History of -  Past Medical History  Diagnosis Date  . Hypertension   . Hyperlipidemia   . GERD (gastroesophageal reflux disease)   . Depression   . Esophageal stricture 2003     [place] post esophageal dilation in 2003 performed by Dr. Marina Goodell, second esophageal dilation performed in 2006  by Dr. Marina Goodell  . Obstructive jaundice due to malignant neoplasm  February 2012     status post ERCP done by Dr. Marina Goodell with biliary sphincterotomy, bile duct stricture brushings, biliary stent placement, performed secondary to obstructive jaundice, malignant-appearing bile duct stricture of the common hepatic duct worrisome for primary cholangiocarcinoma, status post ERCP with biliary sphincterotomy and biliary stent placement...status post biopsy -  markedly atypical cells cons  . Rotator cuff syndrome 2010     per MRI March 2010 there is a prominent articular surface partial tear of the supraspinatus which is nearly full thickness, as well as a tear of the distal anterior supraspinatus which probably represents a full-thickness partial tear,  with moderate supraspinatus tendinopathy, moderate infraspinatus tendinopathy and prominent undersurface tear of the acromion along the coracoacromial ligament  . Hiatal hernia   . SOB (shortness of breath) on exertion   . Bile duct adenocarcinoma       Past Surgical History  Procedure Date  . Cholecystectomy  2007  .  aortogram   August 2011     ultrasound-guided access to the right common femoral artery, an aortogram with bilateral iliac arteriogram and bilateral lower extremity runoff, this was performed secondary to progressive bilateral lower extremity claudication, left common iliac artery occlusion as well as short segment of right superficial femoral artery occlusion, procedure performed by Dr. Durwin Nora  . Bypass graft  January 2009     of the  infected femoral-femoral graft with vein patch angioplasty of bilateral femoral artery, 7 by [name],   .  abdominal aortic angiography  September 2008     performed by Dr. 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 Cardiac catheterization  August 2011     patent coronary arteries with diffuse nonobstructive disease, there are no areas of high grade stenosis present,  determined the chest pain is most likely noncardiac in origin, continue medical management.  . Bile duct surgery     60% of liver removed  . Arthroscopy knee w/ drilling   . Esophagogastroduodenoscopy 07/10/2011    Procedure: ESOPHAGOGASTRODUODENOSCOPY (EGD);  Surgeon: Yancey Flemings, MD;  Location: [facility];  Service: Endoscopy;  Laterality: N/A;    in for   Chief Complaint  Patient presents with  . Anemia     HPI  [name]  is a 60 y.o. female, with above dictated past medical and surgical history noticeably of cholangiocarcinoma, status post radiation and ongoing chemotherapy, who has had problems with upper GI bleed due to radiation and chemotherapy-induced gastritis and duodenitis, she was recently admitted and underwent an EGD and 2 unit of packed RBC transfusion with some improvement in her H&H , presents again with ongoing dark tarry stools and diffuse weakness, she was seen at med [facility] this morning from where my partner Dr. Janee Morn accepted her in admission. Patient has now arrived to our facility a few minutes ago, and I was asked as  to see the patient .  Patient currently is relatively symptom-free except for complaining of dark tarry stools she denies any chest pain denies any dizziness denies any abdominal pain or discomfort she has a rather flat affect.   Review of Systems    In addition to the HPI above,  No  Fever-chills, No Headache, No changes with Vision or hearing, No problems swallowing food or Liquids, No Chest pain, Cough or Shortness of Breath, No Abdominal pain, No Nausea or Vommitting, Bowel movements are dark, No Blood in Urine, No dysuria, No new skin rashes or bruises, No new joints pains-aches,  No new weakness, tingling, numbness in any extremity, No recent weight gain or loss, No polyuria, polydypsia or polyphagia, No significant Mental Stressors.  A full 10 point Review of Systems was done, except as stated above, all other Review of Systems were negative.   Social History History  Substance Use Topics  . Smoking status: Former Smoker    Quit date: 06/14/1999  . Smokeless tobacco: Not on file  . Alcohol Use: No      Family History Family History  Problem Relation Age of Onset  . Heart disease Brother       Prior to Admission medications   Medication Sig Start Date End Date Taking? Authorizing Provider  dexamethasone (DECADRON) 4 MG tablet Take 4-8 mg by mouth daily with breakfast. Take 2 tabs by mouth days 1 and 3 of chemo cycle, then 1 tab by mouth days 4 and 5 of each cycle of chemo   Yes Colman Cater, FNP  gabapentin (NEURONTIN) 300 MG capsule Take 2 capsules (600 mg total) by mouth 3 (three) times daily. 06/14/11  Yes Genella Mech, MD  LORazepam (ATIVAN) 0.5 MG tablet Take 0.5 mg by mouth every 8 (eight) hours.     Yes Historical Provider, MD  metoprolol (TOPROL-XL) 100 MG 24 hr tablet Take 1 tablet (100 mg total) by mouth daily. 06/14/11  Yes Genella Mech, MD  Omeprazole 20 MG TBEC Take 1 tablet (20 mg total) by mouth 2 (two) times daily. 06/14/11  Yes Genella Mech, MD  oxycodone (OXY-IR) 5 MG capsule Take 5 mg by mouth every 4 (four) hours as needed. For pain   Yes Historical Provider, MD  promethazine (PHENERGAN) 12.5 MG tablet Take 12.5 mg by mouth every 6 (six) hours as needed. For nausea   Yes Historical Provider, MD  simvastatin (ZOCOR) 40  MG tablet Take 1 tablet (40 mg total) by mouth at bedtime. 06/14/11  Yes Genella Mech, MD  nitroGLYCERIN (NITROSTAT) 0.4 MG SL tablet Place 1 tablet (0.4 mg total) under the tongue every 5 (five) minutes as needed. Up to 3 times for chest pain 06/14/11   Genella Mech, MD    Allergies  Allergen Reactions  . Sulfonamide Derivatives Hives    Physical Exam  Intake/Output Summary (Last 24 hours) at 07/19/11 2059 Last data filed at 07/19/11 1933  Gross per 24 hour  Intake      0 ml  Output   1200 ml  Net  -1200 ml   Blood pressure 130/69, pulse 67, temperature 100.2 F (37.9 C), temperature source Oral, resp. rate 17, SpO2 100.00%.  1. General middle aged AA Female lying in bed in NAD,     2. Normal affect and insight, Not Suicidal or Homicidal, Awake Alert, Oriented *3.  3. No F.N deficits, ALL C.Nerves Intact, Strength 5/5 all 4 extremities, Sensation intact all 4 extremities, Plantars down going.  4.  Ears and Eyes appear Normal, Conjunctivae clear, PERRLA. Moist Oral Mucosa.  5. Supple Neck, No JVD, No cervical lymphadenopathy appriciated, No Carotid Bruits.  6. Symmetrical Chest wall movement, Good air movement bilaterally, CTAB.  7. RRR, No Gallops, Rubs or Murmurs, No Parasternal Heave.  8. Positive Bowel Sounds, Abdomen Soft, Non tender, No organomegaly appriciated,       No rebound -guarding or rigidity. FOBT +ve.  9.  No Cyanosis, Normal Skin Turgor, No Skin Rash or Bruise.  10. Good muscle tone,  joints appear normal , no effusions, Normal ROM.  11. No Palpable Lymph Nodes in Neck or Axillae     Data Review  CBC  Lab 07/19/11 1250 07/19/11 1003  WBC 7.1 7.8  HGB 5.9* 6.1*  HCT 18.6* 19.8*  PLT 114* 116*  MCV 102.8* 106*  MCH 32.6 --  MCHC 31.7 --  RDW 19.1* --  LYMPHSABS 0.5* --  MONOABS 1.2* --  EOSABS 0.1 0.1  BASOSABS 0.0 0.0  BANDABS -- --    ------------------------------------------------------------------------------------------------------------------ Chemistries   Lab 07/19/11 1250 07/19/11 1003  NA 137 140  K 3.9 4.1  CL 105 106  CO2 26 27  GLUCOSE 99 104*  BUN 14 14  CREATININE 0.60 0.73  CALCIUM 8.5 8.4  MG -- --  AST 19 18  ALT 12 13  ALKPHOS 69 63  BILITOT 0.2* 0.3    Imaging results:   Dg Chest 2 View  07/19/2011  *RADIOLOGY REPORT*  Clinical Data: Upper GI bleed.Hypertension.  Bile duct carcinoma.  CHEST - 2 VIEW  Comparison: 11/11/2010  Findings: Right Port-A-Cath is in place with the tip in the SVC. No pneumothorax.  Heart is mildly enlarged.  Blunting of the right costophrenic angle compatible with small effusion, significantly improved since prior study. No focal right airspace opacity.  Left lung is clear.  No acute bony abnormality.  IMPRESSION: Small right effusion, much improved since prior study.  Mild cardiomegaly.  Original Report Authenticated By: Cyndie Chime, M.D.   Assessment & Plan  1.  Generalized weakness due to upper GI bleed induced anemia- plan is to admit the patient to step down I have already discussed the case with Dr. Sharla Kidney was on the floor, will commence with transfusing 3 units of packed RBCs patient is hemodynamically stable, will give her IV Protonix bolus followed by drip, she had a recent EGD which revealed radiation/chemotherapy used gastroduodenitis, most likely she has this problem ongoing as she says that her stools never became light in the last week. GI to see in the morning and do EGD. We'll keep her n.p.o. after midnight except for sips of water for medications. Monitor H&H post transfusion.  2. Cholangiocarcinoma - status post liver resection surgery done at outlying tertiary hospital - once stable outpatient Hem onch follow up, of note patient is still on Decadron.  3. History of hypertension currently blood pressure is stable patient is on high-dose Toprol XL,  will reduce it to Lopressor 25 q. 8 hrs with holding parameters as she has the potential to drop her blood pressure.   4. History of dyslipidemia no acute issues continue home dose statin.  5. History of diffuse non-obstructive CAD no acute issues patient is pain-free beta blocker and when necessary nitroglycerin  6. Low grade Temp on arrival 100.2 - repeat 99.8, CXR stable earlier, will repeat with UA, B Cultures if temp>100.4   DVT Prophylaxis  SCDs    AM Labs Ordered, also please review Full  Orders Admission, patients condition and plan of care including tests being ordered have been discussed with the patient   who indicates understanding and agree with the plan and Code Status.  Code Status full  Condition GUARDED  The patient is being admitted to the Hospitalist Service.

## 2011-07-31 NOTE — Interval H&P Note (Signed)
History and Physical Interval Note: NO CHANGES IN HX OR PHYSICAL SINCE LAST EVALUATION 07-10-11  07/31/2011 12:42 PM  [name]  has presented today for surgery, with the diagnosis of Anemia [285.9]  The various methods of treatment have been discussed with the patient and family. After consideration of risks, benefits and other options for treatment, the patient has consented to  Procedure(s): ESOPHAGOGASTRODUODENOSCOPY (EGD) as a surgical intervention .  The patients' history has been reviewed, patient examined, no change in status, stable for surgery.  I have reviewed the patients' chart and labs.  Questions were answered to the patient's satisfaction.   THE PATIENT IS FOR EGD WITH APC FOR RADIATION GASTRITIS WITH BLEEDING. LAST EXAM WITH ME NOV 28 , 2012.The nature of the procedure, as well as the risks, benefits, and alternatives were carefully and thoroughly reviewed with the patient. Ample time for discussion and questions allowed. The patient understood, was satisfied, and agreed to proceed.   [name]. Eda Keys., M.D. [facility] Division of Gastroenterology

## 2011-07-31 NOTE — Op Note (Signed)
[facility] [address] [place], [place]  [address]  ENDOSCOPY PROCEDURE REPORT  PATIENT:  Sherry, [name]  MR#:  [id number] BIRTHDATE:  [birth year], 60 yrs. old  GENDER:  female  ENDOSCOPIST:  [name]. Eda Keys, MD Referred by:  .Direct  PROCEDURE DATE:  07/31/2011 PROCEDURE:  EGD for control of bleeding - APC ASA CLASS:  Class III INDICATIONS:  melena ; APC treatment of radiation gastritis (LAST EXAM 07-10-11)  MEDICATIONS:   Fentanyl 100 mcg IV, Versed 8 mg IV TOPICAL ANESTHETIC:  Cetacaine Spray  DESCRIPTION OF PROCEDURE:   After the risks benefits and alternatives of the procedure were thoroughly explained, informed consent was obtained.  The Pentax [id number] 3.2 [id number] endoscope was introduced through the mouth and advanced to the second portion of the duodenum, without limitations.  The instrument was slowly withdrawn as the mucosa was fully examined. <<PROCEDUREIMAGES>>  The esophagus and gastroesophageal junction were completely normal in appearance.  Moderate radiation gastritis with some oozing was found in the antrum. This was improved from prior exam.  Otherwise normal stomach. Radiation Duodenitis  /o bleedingwas found in the duodenal bulb The posbulbar duodenum was normal duodenum. Retroflexed views revealed no abnormalities.  THERAPY: APC OBLITERATION OF BLEEING AND LARGER NONBLEEDING LESIONS.   The scope was then withdrawn from the patient and the procedure completed.  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 THEY SHOULD TRASFUSE YOU AS NEEDED 2) REPEAT ENDOSCOPY WITH APC IN ABOUT 4 WEEKS. MY OFFICE WILL ARRANGE  ______________________________ [name]. Eda Keys, MD  CC:  Arlan Organ, MD;  The  Patient  n. eSIGNED:   [name]. Eda Keys at 07/31/2011 01:49 PM  [name], [phone]

## 2011-07-31 NOTE — Op Note (Signed)
SEE PENTAX REPORT. APC TO TREAT RADIATION GASTRITIS

## 2011-08-01 ENCOUNTER — Ambulatory Visit (INDEPENDENT_AMBULATORY_CARE_PROVIDER_SITE_OTHER): Payer: Medicare Other | Admitting: Internal Medicine

## 2011-08-01 ENCOUNTER — Encounter (HOSPITAL_COMMUNITY): Payer: Self-pay | Admitting: Internal Medicine

## 2011-08-01 DIAGNOSIS — I7389 Other specified peripheral vascular diseases: Secondary | ICD-10-CM

## 2011-08-01 DIAGNOSIS — K520 Gastroenteritis and colitis due to radiation: Secondary | ICD-10-CM

## 2011-08-01 NOTE — Assessment & Plan Note (Signed)
Followed by Dr. Marina Goodell. Had upper endoscopy yesterday- will have a repeat in 4 weeks. No new problems.

## 2011-08-01 NOTE — Patient Instructions (Signed)
Please make a followup appointment in 2-3 months or as needed.  Call the clinic if you have any questions or do need to make an early appointment.  Followup with her cancer doctor and Dr. Marina Goodell closely. Take all your medications regularly.

## 2011-08-01 NOTE — Assessment & Plan Note (Signed)
I will sign the form for handicap today.  She is going to see [name] soon for possible bypass for her PVD.

## 2011-08-01 NOTE — Progress Notes (Signed)
  Subjective:    Patient ID: [name], female    DOB: 1951/06/21, 60 y.o.   MRN: [id number]  HPI [name] is a pleasant 60 -year-old woman with unfortunate past with history of metastatic cholangiocarcinoma status post Whipple surgery at [facility]- getting radiation and chemotherapy at the cancer Center- comes for signing form for handicap.  She  Was diagnosed with cholangiocarcinoma earlier this year and had surgical procedure at [facility] and after that is followed by chemotherapy and radiation at cancer center [facility].  Unfortunately she started having GI bleeding secondary to radiation gastritis and chemotherapy was stopped.  She had her repeat EGD in yesterday per Dr. Marina Goodell at [facility]. She is going to have one in 4 weeks- and if that she was no more bleeding, she will be started back on chemotherapy.  She denies any fever, chills, nausea vomiting, abdominal pain, chest pain.  She cannot walk without a cane, if she goes to the store she uses a wheelchair as she gets short of breath with few steps.  She has severe PVD- for which she is followed by [name] with [facility] and is going to have surgery for PVD soon. She has thigh, calf and leg pain along with tingling and numbness in the foot - bilaterally.  Her vitals are stable today .  I will sign the form for handicap.      Review of Systems     as per history of present illness, all other systems reviewed and negative. Objective:   Physical Exam  General: NAD- in wheelchair. HEENT: PERRL, EOMI, no scleral icterus Cardiac: S1, S2, RRR, no rubs, murmurs or gallops Pulm: clear to auscultation bilaterally, moving normal volumes of air Abd: soft, nontender, nondistended, BS present Ext: warm and well perfused, no pedal edema Neuro: alert and oriented X3, cranial nerves II-XII grossly intact       Assessment & Plan:

## 2011-08-02 ENCOUNTER — Other Ambulatory Visit: Payer: Medicare Other | Admitting: Lab

## 2011-08-07 ENCOUNTER — Other Ambulatory Visit: Payer: Medicare Other | Admitting: Lab

## 2011-08-08 ENCOUNTER — Ambulatory Visit: Payer: Medicare Other

## 2011-08-08 ENCOUNTER — Other Ambulatory Visit (HOSPITAL_BASED_OUTPATIENT_CLINIC_OR_DEPARTMENT_OTHER): Payer: Medicare Other | Admitting: Lab

## 2011-08-08 ENCOUNTER — Ambulatory Visit (HOSPITAL_BASED_OUTPATIENT_CLINIC_OR_DEPARTMENT_OTHER): Payer: Medicare Other

## 2011-08-08 DIAGNOSIS — Z5111 Encounter for antineoplastic chemotherapy: Secondary | ICD-10-CM

## 2011-08-08 DIAGNOSIS — C240 Malignant neoplasm of extrahepatic bile duct: Secondary | ICD-10-CM

## 2011-08-08 DIAGNOSIS — D500 Iron deficiency anemia secondary to blood loss (chronic): Secondary | ICD-10-CM

## 2011-08-08 LAB — CBC WITH DIFFERENTIAL (CANCER CENTER ONLY)
BASO#: 0 10*3/uL (ref 0.0–0.2)
Eosinophils Absolute: 0.1 10*3/uL (ref 0.0–0.5)
HCT: 33.3 % — ABNORMAL LOW (ref 34.8–46.6)
HGB: 11 g/dL — ABNORMAL LOW (ref 11.6–15.9)
LYMPH%: 9.7 % — ABNORMAL LOW (ref 14.0–48.0)
MCH: 32.1 pg (ref 26.0–34.0)
MCV: 97 fL (ref 81–101)
MONO#: 0.6 10*3/uL (ref 0.1–0.9)
NEUT%: 78.9 % (ref 39.6–80.0)
Platelets: 120 10*3/uL — ABNORMAL LOW (ref 145–400)
RBC: 3.43 10*6/uL — ABNORMAL LOW (ref 3.70–5.32)
WBC: 5.8 10*3/uL (ref 3.9–10.0)

## 2011-08-08 MED ORDER — PROCHLORPERAZINE MALEATE 10 MG PO TABS
10.0000 mg | ORAL_TABLET | Freq: Once | ORAL | Status: AC
  Administered 2011-08-08: 10 mg via ORAL

## 2011-08-08 MED ORDER — SODIUM CHLORIDE 0.9 % IV SOLN
1000.0000 mg/m2 | Freq: Once | INTRAVENOUS | Status: AC
  Administered 2011-08-08: 1824 mg via INTRAVENOUS
  Filled 2011-08-08: qty 48

## 2011-08-08 MED ORDER — SODIUM CHLORIDE 0.9 % IJ SOLN
10.0000 mL | INTRAMUSCULAR | Status: DC | PRN
  Administered 2011-08-08: 10 mL
  Filled 2011-08-08: qty 10

## 2011-08-08 MED ORDER — SODIUM CHLORIDE 0.9 % IV SOLN
Freq: Once | INTRAVENOUS | Status: AC
  Administered 2011-08-08: 14:00:00 via INTRAVENOUS

## 2011-08-08 MED ORDER — HEPARIN SOD (PORK) LOCK FLUSH 100 UNIT/ML IV SOLN
500.0000 [IU] | Freq: Once | INTRAVENOUS | Status: AC | PRN
  Administered 2011-08-08: 500 [IU]
  Filled 2011-08-08: qty 5

## 2011-08-08 NOTE — Patient Instructions (Signed)
[facility] Discharge Instructions for Patients Receiving Chemotherapy    To help prevent nausea and vomiting after your treatment, we encourage you to take your nausea medication    If you develop nausea and vomiting that is not controlled by your nausea medication, call the clinic. If it is after clinic hours your family physician or the after hours number for the clinic or go to the Emergency Department.   BELOW ARE SYMPTOMS THAT SHOULD BE REPORTED IMMEDIATELY:  *FEVER GREATER THAN 100.5 F  *CHILLS WITH OR WITHOUT FEVER  NAUSEA AND VOMITING THAT IS NOT CONTROLLED WITH YOUR NAUSEA MEDICATION  *UNUSUAL SHORTNESS OF BREATH  *UNUSUAL BRUISING OR BLEEDING  TENDERNESS IN MOUTH AND THROAT WITH OR WITHOUT PRESENCE OF ULCERS  *URINARY PROBLEMS  *BOWEL PROBLEMS  UNUSUAL RASH Items with * indicate a potential emergency and should be followed up as soon as possible.  One of the nurses will contact you 24 hours after your treatment. Please let the nurse know about any problems that you may have experienced. Feel free to call the clinic you have any questions or concerns. The clinic phone number is [phone].   I have been informed and understand all the instructions given to me. I know to contact the clinic, my physician, or go to the Emergency Department if any problems should occur. I do not have any questions at this time, but understand that I may call the clinic during office hours   should I have any questions or need assistance in obtaining follow up care.    __________________________________________  _____________  __________ Signature of Patient or Authorized Representative            Date                   Time    __________________________________________ Nurse's Signature    

## 2011-08-09 ENCOUNTER — Ambulatory Visit (HOSPITAL_BASED_OUTPATIENT_CLINIC_OR_DEPARTMENT_OTHER): Payer: Medicare Other

## 2011-08-09 ENCOUNTER — Other Ambulatory Visit: Payer: Medicare Other | Admitting: Lab

## 2011-08-09 DIAGNOSIS — C7800 Secondary malignant neoplasm of unspecified lung: Secondary | ICD-10-CM

## 2011-08-09 DIAGNOSIS — D500 Iron deficiency anemia secondary to blood loss (chronic): Secondary | ICD-10-CM

## 2011-08-09 DIAGNOSIS — C221 Intrahepatic bile duct carcinoma: Secondary | ICD-10-CM

## 2011-08-09 DIAGNOSIS — Z5111 Encounter for antineoplastic chemotherapy: Secondary | ICD-10-CM

## 2011-08-09 MED ORDER — SODIUM CHLORIDE 0.9 % IV SOLN
70.0000 mg/m2 | Freq: Once | INTRAVENOUS | Status: AC
  Administered 2011-08-09: 128 mg via INTRAVENOUS
  Filled 2011-08-09: qty 128

## 2011-08-09 MED ORDER — SODIUM CHLORIDE 0.9 % IJ SOLN
10.0000 mL | INTRAMUSCULAR | Status: DC | PRN
  Administered 2011-08-09: 10 mL
  Filled 2011-08-09: qty 10

## 2011-08-09 MED ORDER — DEXAMETHASONE SODIUM PHOSPHATE 4 MG/ML IJ SOLN
12.0000 mg | Freq: Once | INTRAMUSCULAR | Status: AC
  Administered 2011-08-09: 12 mg via INTRAVENOUS

## 2011-08-09 MED ORDER — SODIUM CHLORIDE 0.9 % IV SOLN
1020.0000 mg | Freq: Once | INTRAVENOUS | Status: AC
  Administered 2011-08-09: 1020 mg via INTRAVENOUS
  Filled 2011-08-09: qty 34

## 2011-08-09 MED ORDER — SODIUM CHLORIDE 0.9 % IV SOLN
Freq: Once | INTRAVENOUS | Status: AC
  Administered 2011-08-09: 11:00:00 via INTRAVENOUS

## 2011-08-09 MED ORDER — HEPARIN SOD (PORK) LOCK FLUSH 100 UNIT/ML IV SOLN
500.0000 [IU] | Freq: Once | INTRAVENOUS | Status: AC | PRN
  Administered 2011-08-09: 500 [IU]
  Filled 2011-08-09: qty 5

## 2011-08-09 MED ORDER — SODIUM CHLORIDE 0.9 % IV SOLN
150.0000 mg | Freq: Once | INTRAVENOUS | Status: AC
  Administered 2011-08-09: 150 mg via INTRAVENOUS
  Filled 2011-08-09: qty 5

## 2011-08-09 MED ORDER — PALONOSETRON HCL INJECTION 0.25 MG/5ML
0.2500 mg | Freq: Once | INTRAVENOUS | Status: AC
  Administered 2011-08-09: 0.25 mg via INTRAVENOUS

## 2011-08-09 MED ORDER — POTASSIUM CHLORIDE 2 MEQ/ML IV SOLN
Freq: Once | INTRAVENOUS | Status: AC
  Administered 2011-08-09: 11:00:00 via INTRAVENOUS
  Filled 2011-08-09: qty 10

## 2011-08-12 ENCOUNTER — Other Ambulatory Visit: Payer: Self-pay | Admitting: Internal Medicine

## 2011-08-14 ENCOUNTER — Other Ambulatory Visit: Payer: Medicare Other | Admitting: Lab

## 2011-08-14 NOTE — Telephone Encounter (Signed)
The original prescription according to the pharmacy was written 2/24 2012 by dr Whitney Post

## 2011-08-14 NOTE — Telephone Encounter (Signed)
This medication is not on patient's med list; please find out who prescribed it and whether she is taking it.

## 2011-08-14 NOTE — Telephone Encounter (Signed)
Has she been taking it?

## 2011-08-15 ENCOUNTER — Ambulatory Visit (HOSPITAL_BASED_OUTPATIENT_CLINIC_OR_DEPARTMENT_OTHER): Payer: Medicare Other

## 2011-08-15 ENCOUNTER — Other Ambulatory Visit (HOSPITAL_BASED_OUTPATIENT_CLINIC_OR_DEPARTMENT_OTHER): Payer: Medicare Other | Admitting: Lab

## 2011-08-15 ENCOUNTER — Other Ambulatory Visit: Payer: Self-pay | Admitting: *Deleted

## 2011-08-15 VITALS — BP 157/104 | HR 62 | Temp 98.5°F | Ht 63.0 in | Wt 161.5 lb

## 2011-08-15 DIAGNOSIS — R112 Nausea with vomiting, unspecified: Secondary | ICD-10-CM

## 2011-08-15 DIAGNOSIS — R52 Pain, unspecified: Secondary | ICD-10-CM

## 2011-08-15 DIAGNOSIS — Z5111 Encounter for antineoplastic chemotherapy: Secondary | ICD-10-CM

## 2011-08-15 DIAGNOSIS — C240 Malignant neoplasm of extrahepatic bile duct: Secondary | ICD-10-CM

## 2011-08-15 DIAGNOSIS — I10 Essential (primary) hypertension: Secondary | ICD-10-CM

## 2011-08-15 DIAGNOSIS — C221 Intrahepatic bile duct carcinoma: Secondary | ICD-10-CM

## 2011-08-15 LAB — CBC WITH DIFFERENTIAL (CANCER CENTER ONLY)
BASO%: 0 % (ref 0.0–2.0)
Eosinophils Absolute: 0 10*3/uL (ref 0.0–0.5)
HCT: 30.8 % — ABNORMAL LOW (ref 34.8–46.6)
LYMPH%: 14.2 % (ref 14.0–48.0)
MCH: 32 pg (ref 26.0–34.0)
MCV: 94 fL (ref 81–101)
MONO#: 0.1 10*3/uL (ref 0.1–0.9)
MONO%: 5.3 % (ref 0.0–13.0)
NEUT%: 80.1 % — ABNORMAL HIGH (ref 39.6–80.0)
RDW: 14.5 % (ref 11.1–15.7)
WBC: 2.3 10*3/uL — ABNORMAL LOW (ref 3.9–10.0)

## 2011-08-15 MED ORDER — OXYCODONE HCL 5 MG PO CAPS: 5.0000 mg | ORAL_CAPSULE | Freq: Four times a day (QID) | ORAL | Status: DC | PRN

## 2011-08-15 MED ORDER — METOCLOPRAMIDE HCL 10 MG PO TABS: 10.0000 mg | ORAL_TABLET | Freq: Four times a day (QID) | ORAL | Status: DC | PRN

## 2011-08-15 MED ORDER — SODIUM CHLORIDE 0.9 % IV SOLN
INTRAVENOUS | Status: AC
  Administered 2011-08-15: 12:00:00 via INTRAVENOUS

## 2011-08-15 MED ORDER — ONDANSETRON 8 MG/50ML IVPB (CHCC)
8.0000 mg | Freq: Once | INTRAVENOUS | Status: AC
  Administered 2011-08-15: 8 mg via INTRAVENOUS

## 2011-08-15 MED ORDER — LORAZEPAM 2 MG/ML IJ SOLN
1.0000 mg | Freq: Once | INTRAMUSCULAR | Status: AC
  Administered 2011-08-15: 1 mg via INTRAVENOUS

## 2011-08-15 MED ORDER — DEXAMETHASONE SODIUM PHOSPHATE 10 MG/ML IJ SOLN
10.0000 mg | Freq: Once | INTRAMUSCULAR | Status: AC
  Administered 2011-08-15: 10 mg via INTRAVENOUS

## 2011-08-15 MED ORDER — LORAZEPAM 0.5 MG PO TABS: 0.5000 mg | ORAL_TABLET | Freq: Three times a day (TID) | ORAL | Status: DC | PRN

## 2011-08-16 ENCOUNTER — Telehealth: Payer: Self-pay | Admitting: *Deleted

## 2011-08-16 ENCOUNTER — Other Ambulatory Visit: Payer: Self-pay | Admitting: Hematology & Oncology

## 2011-08-16 ENCOUNTER — Other Ambulatory Visit: Payer: Medicare Other | Admitting: Lab

## 2011-08-16 NOTE — Telephone Encounter (Signed)
Pt aware of 1-10, 1-31, 2-1 and 2-7 appointments

## 2011-08-19 ENCOUNTER — Other Ambulatory Visit: Payer: Self-pay | Admitting: Hematology & Oncology

## 2011-08-20 NOTE — Telephone Encounter (Signed)
i spoke w/ pt she states that she does use reglan, not daily but often due to her cancer

## 2011-08-20 NOTE — Telephone Encounter (Signed)
This was just refilled by Dr. Myna Hidalgo on 08/15/11.

## 2011-08-22 ENCOUNTER — Other Ambulatory Visit: Payer: Medicare Other | Admitting: Lab

## 2011-08-22 ENCOUNTER — Ambulatory Visit: Payer: Medicare Other

## 2011-08-22 ENCOUNTER — Ambulatory Visit (HOSPITAL_BASED_OUTPATIENT_CLINIC_OR_DEPARTMENT_OTHER): Payer: Medicare Other | Admitting: Lab

## 2011-08-22 ENCOUNTER — Telehealth: Payer: Self-pay | Admitting: Hematology & Oncology

## 2011-08-22 DIAGNOSIS — C249 Malignant neoplasm of biliary tract, unspecified: Secondary | ICD-10-CM

## 2011-08-22 DIAGNOSIS — D62 Acute posthemorrhagic anemia: Secondary | ICD-10-CM

## 2011-08-22 LAB — CBC WITH DIFFERENTIAL (CANCER CENTER ONLY)
BASO%: 0.4 % (ref 0.0–2.0)
EOS%: 0.4 % (ref 0.0–7.0)
HCT: 26.9 % — ABNORMAL LOW (ref 34.8–46.6)
LYMPH%: 12.7 % — ABNORMAL LOW (ref 14.0–48.0)
MCHC: 33.1 g/dL (ref 32.0–36.0)
MCV: 96 fL (ref 81–101)
NEUT%: 79.1 % (ref 39.6–80.0)
RDW: 14.7 % (ref 11.1–15.7)

## 2011-08-22 LAB — COMPREHENSIVE METABOLIC PANEL
Alkaline Phosphatase: 94 U/L (ref 39–117)
BUN: 10 mg/dL (ref 6–23)
Creatinine, Ser: 0.72 mg/dL (ref 0.50–1.10)
Glucose, Bld: 72 mg/dL (ref 70–99)
Total Bilirubin: 0.2 mg/dL — ABNORMAL LOW (ref 0.3–1.2)

## 2011-08-22 LAB — IRON AND TIBC
TIBC: 236 ug/dL — ABNORMAL LOW (ref 250–470)
UIBC: 111 ug/dL — ABNORMAL LOW (ref 125–400)

## 2011-08-22 NOTE — Progress Notes (Signed)
After Dr. Myna Hidalgo reviewed lab work, decided not to treat today.  [name], Londyn Hotard [employer]

## 2011-08-22 NOTE — Telephone Encounter (Signed)
Left message with 1-17,1-31 and 2-1 appointments

## 2011-08-23 ENCOUNTER — Ambulatory Visit: Payer: Medicare Other

## 2011-08-27 ENCOUNTER — Encounter: Payer: Self-pay | Admitting: Vascular Surgery

## 2011-08-28 ENCOUNTER — Ambulatory Visit (INDEPENDENT_AMBULATORY_CARE_PROVIDER_SITE_OTHER): Payer: Medicare Other | Admitting: Vascular Surgery

## 2011-08-28 ENCOUNTER — Encounter: Payer: Self-pay | Admitting: Vascular Surgery

## 2011-08-28 ENCOUNTER — Ambulatory Visit (INDEPENDENT_AMBULATORY_CARE_PROVIDER_SITE_OTHER): Payer: Medicare Other | Admitting: *Deleted

## 2011-08-28 VITALS — BP 131/82 | HR 60 | Temp 97.8°F | Ht 63.0 in | Wt 165.0 lb

## 2011-08-28 DIAGNOSIS — I739 Peripheral vascular disease, unspecified: Secondary | ICD-10-CM

## 2011-08-28 DIAGNOSIS — I7092 Chronic total occlusion of artery of the extremities: Secondary | ICD-10-CM

## 2011-08-28 NOTE — Progress Notes (Signed)
[facility] [place]  Patient name: [name] MRN: [id number] DOB: 1950-11-10 Sex: female  REASON FOR VISIT: follow up of left iliac artery occlusion  HPI: [name] is a 61 y.o. female who had undergone a right to left fem-fem bypass graft which was later removed because of infection. I been following her with stable claudication. She presents for a 9 month follow up visit. Since I saw her last she continues to have claudication in her left calf and a fairly short distance. He symptoms have been stable over the last 6 months. She denies rest pain. She's had no history of nonhealing wounds.  Since I saw her last she has been treated with bile duct cancer. She's undergone surgery and also has had chemotherapy and radiation therapy.  Past Medical History  Diagnosis Date  . Hypertension   . Hyperlipidemia   . GERD (gastroesophageal reflux disease)   . Depression   . Esophageal stricture 2003     [place] post esophageal dilation in 2003 performed by Dr. Marina Goodell, second esophageal dilation performed in 2006  by Dr. Marina Goodell  . Obstructive jaundice due to malignant neoplasm  February 2012     status post ERCP done by Dr. Marina Goodell with biliary sphincterotomy, bile duct stricture brushings, biliary stent placement, performed secondary to obstructive jaundice, malignant-appearing bile duct stricture of the common hepatic duct worrisome for primary cholangiocarcinoma, status post ERCP with biliary sphincterotomy and biliary stent placement...status post biopsy -  markedly atypical cells cons  . Rotator cuff syndrome 2010     per MRI March 2010 there is a prominent articular surface partial tear of the supraspinatus which is nearly full thickness, as well as a tear of the distal anterior supraspinatus which probably represents a full-thickness partial tear,  with moderate supraspinatus tendinopathy, moderate infraspinatus tendinopathy and prominent undersurface tear of the acromion  along the coracoacromial ligament  . Hiatal hernia   . SOB (shortness of breath) on exertion   . Bile duct adenocarcinoma   . Myocardial infarction   . Angina   . Headache   . Arthritis   . Dysrhythmia   . Peripheral vascular disease   . Anemia     Family History  Problem Relation Age of Onset  . Heart disease Brother     SOCIAL HISTORY: History  Substance Use Topics  . Smoking status: Former Smoker    Quit date: 06/14/1999  . Smokeless tobacco: Not on file  . Alcohol Use: No    Allergies  Allergen Reactions  . Sulfonamide Derivatives Hives    Current Outpatient Prescriptions  Medication Sig Dispense Refill  . dexamethasone (DECADRON) 4 MG tablet Take 4-8 mg by mouth daily with breakfast. Take 2 tabs by mouth days 1 and 3 of chemo cycle, then 1 tab by mouth days 4 and 5 of each cycle of chemo      . folic acid (FOLVITE) 1 MG tablet Take 1 tablet (1 mg total) by mouth daily.  30 tablet  0  . gabapentin (NEURONTIN) 300 MG capsule Take 2 capsules (600 mg total) by mouth 3 (three) times daily.  180 capsule  3  . loratadine (CLARITIN) 10 MG tablet Take 1 tablet (10 mg total) by mouth daily.  30 tablet  0  . LORazepam (ATIVAN) 0.5 MG tablet Take 1 tablet (0.5 mg total) by mouth every 8 (eight) hours as needed for anxiety.  30 tablet  3  . metoprolol (TOPROL-XL) 100 MG 24 hr  tablet Take 1 tablet (100 mg total) by mouth daily.  90 tablet  3  . nitroGLYCERIN (NITROSTAT) 0.4 MG SL tablet Place 1 tablet (0.4 mg total) under the tongue every 5 (five) minutes as needed. Up to 3 times for chest pain  10 tablet  1  . Omeprazole 20 MG TBEC Take 1 tablet (20 mg total) by mouth 2 (two) times daily.  180 each  3  . oxycodone (OXY-IR) 5 MG capsule Take 1 capsule (5 mg total) by mouth every 6 (six) hours as needed for pain (May take 1-2 po q 6H prn). For pain  30 capsule  0  . promethazine (PHENERGAN) 12.5 MG tablet Take 12.5 mg by mouth every 6 (six) hours as needed. For nausea      .  simvastatin (ZOCOR) 40 MG tablet Take 1 tablet (40 mg total) by mouth at bedtime.  31 tablet  1  . sucralfate (CARAFATE) 1 GM/10ML suspension Take 1 g by mouth every 6 (six) hours.      . metoCLOPramide (REGLAN) 10 MG tablet Take 1 tablet (10 mg total) by mouth every 6 (six) hours as needed.  30 tablet  3   No current facility-administered medications for this visit.   Facility-Administered Medications Ordered in Other Visits  Medication Dose Route Frequency Provider Last Rate Last Dose  . acetaminophen (TYLENOL) tablet 650 mg  650 mg Oral Once Colman Cater, FNP      . diphenhydrAMINE (BENADRYL) injection 25 mg  25 mg Intravenous Once Colman Cater, FNP        REVIEW OF SYSTEMS: Arly.Keller ] denotes positive finding; [  ] denotes negative finding  CARDIOVASCULAR:  [ ]  chest pain   [ ]  chest pressure   [ ]  palpitations   [ ]  orthopnea   [ ]  dyspnea on exertion   Arly.Keller ] claudication   [ ]  rest pain   [ ]  DVT   [ ]  phlebitis PULMONARY:   [ ]  productive cough   [ ]  asthma   [ ]  wheezing NEUROLOGIC:   [X]  weakness , not focal [ ]  paresthesias  [ ]  aphasia  [ ]  amaurosis  [ ]  dizziness HEMATOLOGIC:   [ ]  bleeding problems   [ ]  clotting disorders MUSCULOSKELETAL:  [ ]  joint pain   [ ]  joint swelling Arly.Keller ] leg swelling GASTROINTESTINAL: [ ]   blood in stool  [ ]   hematemesis GENITOURINARY:  [ ]   dysuria  [ ]   hematuria PSYCHIATRIC:  [ ]  history of major depression INTEGUMENTARY:  [ ]  rashes  [ ]  ulcers CONSTITUTIONAL:  [ ]  fever   [ ]  chills  PHYSICAL EXAM: Filed Vitals:   08/28/11 1405  BP: 131/82  Pulse: 60  Temp: 97.8 F (36.6 C)  TempSrc: Oral  Height: 5\' 3"  (1.6 m)  Weight: 165 lb (74.844 kg)   Body mass index is 29.23 kg/(m^2). GENERAL: The patient is a well-nourished female, in no acute distress. The vital signs are documented above. CARDIOVASCULAR: There is a regular rate and rhythm without significant murmur appreciated. I do not detect carotid bruits. She has a palpable right femoral  pulse. I cannot palpate a left femoral pulse. I cannot palpate pedal pulses. Both feet are warm and well perfused. She has no significant lower extremity swelling. PULMONARY: There is good air exchange bilaterally without wheezing or rales. ABDOMEN: Soft and non-tender with normal pitched bowel sounds.  MUSCULOSKELETAL: There are no major deformities or cyanosis. NEUROLOGIC: No focal  weakness or paresthesias are detected. SKIN: There are no ulcers or rashes noted. PSYCHIATRIC: The patient has a normal affect.  DATA:  Lab Results  Component Value Date   WBC 2.8* 08/22/2011   HGB 8.9* 08/22/2011   HCT 26.9* 08/22/2011   MCV 96 08/22/2011   PLT 35* 08/22/2011   Lab Results  Component Value Date   NA 144 08/22/2011   K 3.4* 08/22/2011   CL 105 08/22/2011   CO2 28 08/22/2011   Lab Results  Component Value Date   CREATININE 0.72 08/22/2011   Lab Results  Component Value Date   INR 1.17 07/19/2011   INR 1.06 01/16/2011   INR 0.94 09/25/2010   I have independently interpreted her arterial Doppler study which shows monophasic Doppler signals in both feet. She has an ABI of 69% on the right and 71% on the left. These ABIs have remained stable.  MEDICAL ISSUES: Overall pleased with her progress. She has stable claudication of the left leg. I have ordered follow up ABIs in one year now see her back at that time. She knows to call sooner if she has problems. In the meantime I have encouraged her to do as much walking as possible. Fortunately she is no longer a smoker.  Chalice Philbert S [facility] [place] [facility]: [phone]

## 2011-08-29 ENCOUNTER — Ambulatory Visit (HOSPITAL_BASED_OUTPATIENT_CLINIC_OR_DEPARTMENT_OTHER): Payer: Medicare Other | Admitting: Lab

## 2011-08-29 ENCOUNTER — Other Ambulatory Visit: Payer: Medicare Other | Admitting: Lab

## 2011-08-29 ENCOUNTER — Ambulatory Visit (HOSPITAL_BASED_OUTPATIENT_CLINIC_OR_DEPARTMENT_OTHER): Payer: Medicare Other | Admitting: Hematology & Oncology

## 2011-08-29 ENCOUNTER — Ambulatory Visit: Payer: Medicare Other

## 2011-08-29 ENCOUNTER — Ambulatory Visit: Payer: Medicare Other | Admitting: Hematology & Oncology

## 2011-08-29 ENCOUNTER — Encounter (HOSPITAL_COMMUNITY)
Admission: RE | Admit: 2011-08-29 | Discharge: 2011-08-29 | Disposition: A | Discharge status: Home / Self Care | Payer: Medicare Other | Source: Ambulatory Visit | Attending: Hematology & Oncology | Admitting: Hematology & Oncology

## 2011-08-29 ENCOUNTER — Ambulatory Visit (HOSPITAL_BASED_OUTPATIENT_CLINIC_OR_DEPARTMENT_OTHER): Payer: Medicare Other

## 2011-08-29 DIAGNOSIS — D509 Iron deficiency anemia, unspecified: Secondary | ICD-10-CM

## 2011-08-29 DIAGNOSIS — D62 Acute posthemorrhagic anemia: Secondary | ICD-10-CM | POA: Insufficient documentation

## 2011-08-29 DIAGNOSIS — R112 Nausea with vomiting, unspecified: Secondary | ICD-10-CM

## 2011-08-29 DIAGNOSIS — C221 Intrahepatic bile duct carcinoma: Secondary | ICD-10-CM | POA: Insufficient documentation

## 2011-08-29 DIAGNOSIS — F19959 Other psychoactive substance use, unspecified with psychoactive substance-induced psychotic disorder, unspecified: Secondary | ICD-10-CM

## 2011-08-29 DIAGNOSIS — C7800 Secondary malignant neoplasm of unspecified lung: Secondary | ICD-10-CM

## 2011-08-29 DIAGNOSIS — D500 Iron deficiency anemia secondary to blood loss (chronic): Secondary | ICD-10-CM

## 2011-08-29 DIAGNOSIS — Z5112 Encounter for antineoplastic immunotherapy: Secondary | ICD-10-CM

## 2011-08-29 LAB — CBC WITH DIFFERENTIAL (CANCER CENTER ONLY)
Eosinophils Absolute: 0 10*3/uL (ref 0.0–0.5)
MCH: 31.8 pg (ref 26.0–34.0)
MONO#: 0.4 10*3/uL (ref 0.1–0.9)
MONO%: 27.3 % — ABNORMAL HIGH (ref 0.0–13.0)
NEUT#: 0.7 10*3/uL — ABNORMAL LOW (ref 1.5–6.5)
Platelets: 118 10*3/uL — ABNORMAL LOW (ref 145–400)
RBC: 2.67 10*6/uL — ABNORMAL LOW (ref 3.70–5.32)
WBC: 1.5 10*3/uL — ABNORMAL LOW (ref 3.9–10.0)

## 2011-08-29 LAB — CMP (CANCER CENTER ONLY)
ALT(SGPT): 18 U/L (ref 10–47)
AST: 25 U/L (ref 11–38)
Albumin: 2.7 g/dL — ABNORMAL LOW (ref 3.3–5.5)
Alkaline Phosphatase: 89 U/L — ABNORMAL HIGH (ref 26–84)
BUN, Bld: 13 mg/dL (ref 7–22)
Calcium: 8.8 mg/dL (ref 8.0–10.3)
Chloride: 106 mEq/L (ref 98–108)
Potassium: 3.4 mEq/L (ref 3.3–4.7)
Sodium: 138 mEq/L (ref 128–145)
Total Protein: 5.8 g/dL — ABNORMAL LOW (ref 6.4–8.1)

## 2011-08-29 LAB — TYPE AND SCREEN
ABO/RH(D): A POS
Antibody Screen: NEGATIVE

## 2011-08-29 MED ORDER — HEPARIN SOD (PORK) LOCK FLUSH 100 UNIT/ML IV SOLN
500.0000 [IU] | Freq: Once | INTRAVENOUS | Status: AC | PRN
  Administered 2011-08-29: 500 [IU]
  Filled 2011-08-29: qty 5

## 2011-08-29 MED ORDER — SODIUM CHLORIDE 0.9 % IV SOLN
Freq: Once | INTRAVENOUS | Status: AC
  Administered 2011-08-29: 11:00:00 via INTRAVENOUS

## 2011-08-29 MED ORDER — SODIUM CHLORIDE 0.9 % IV SOLN
800.0000 mg/m2 | Freq: Once | INTRAVENOUS | Status: AC
  Administered 2011-08-29: 1482 mg via INTRAVENOUS
  Filled 2011-08-29: qty 39

## 2011-08-29 MED ORDER — SODIUM CHLORIDE 0.9 % IV SOLN
1020.0000 mg | Freq: Once | INTRAVENOUS | Status: AC
  Administered 2011-08-29: 1020 mg via INTRAVENOUS
  Filled 2011-08-29: qty 34

## 2011-08-29 MED ORDER — SODIUM CHLORIDE 0.9 % IJ SOLN
3.0000 mL | INTRAMUSCULAR | Status: DC | PRN
  Administered 2011-08-29: 3 mL via INTRAVENOUS
  Filled 2011-08-29: qty 10

## 2011-08-29 NOTE — Progress Notes (Signed)
This office note has been dictated.

## 2011-08-29 NOTE — Progress Notes (Signed)
DIAGNOSES: 1. Metastatic cholangiocarcinoma. 2. 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 3. 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  CURRENT THERAPY: 1. The patient is receiving cisplatin/gemcitabine. 2. IV iron. 3. Blood transfusions as indicated.  INTERIM HISTORY:  [name] was in today.  She was getting gemcitabine. She received a dose of Phenergan 25 mg IV.  She then began to have a reaction to this.  She was quite "agitated."  She then became somewhat less responsive.  She became very lethargic.  She was disoriented.  We immediately checked her out.  We checked all her vital signs.  All her vital signs looked okay.  She was not hypotensive.  In fact, her blood pressure was a little on the high side.  Oxygen saturation was 100%.  Her heart rate was only about 76.  We gave her some IV fluids.  We then moved her over to the treatment room.  We just monitored her there.  We get some electrolytes on her.  The electrolytes all looked okay.  She was not hypercalcemic.  There was no hyponatremia.  BUN and creatinine were all right.  She had a CBC today which showed a white cell count of 1.5, hemoglobin 8.5, hematocrit 26.1, platelet count 118.  Her monocytes were quite high, so I felt that her white cell count was recovering.  She began to improve slowly but surely.  We just monitored her with IV fluids.  We did have her on some supplemental oxygen.  She was urinating.  There is no bowel or bladder incontinence.  She had no nausea or vomiting.  We did give her the dose of Phenergan because of nausea, but after that, she had no further nausea.  She does have some melenic stools.  Again, this is from her GI bleeding. Dr. Marina Goodell of [facility] has been seeing her.  He has done endoscopy on her and done laser therapy to try to heal up the ulcerated areas that are the source of blood loss.  She will be coming in  tomorrow, depending on the snow, for a transfusion.  I am going to give her a dose of Neulasta because of her neutropenia. Even though her white cell count is recovering, I believe that her white cell count will go back down.  Her tumor marker, unfortunately, has been going up.  We have been having delays in her treatment because of her cytopenia.  Back on 08/22/2011, her CA 19-9 was 363.  PHYSICAL EXAM:  Vital Signs:  Her temperature is 98.3, pulse 62, respiratory rate 18, blood pressure 140/93.  Weight was 165.  Head/Neck: Exam showed no scleral icterus.  There is no mucositis in the oral cavity.  She had no thrush.  There is no adenopathy in her neck.  She had no nystagmus.  Pupils reacted appropriately.  Lungs:  Clear bilaterally.  Cardiac:  Regular rate and rhythm with a normal S1, S2. There are no murmurs, rubs or bruits.  Abdomen:  Soft with good bowel sounds.  There is no fluid wave.  Her laparotomy wound is well-healed. She has no guarding or rebound tenderness.  There is no palpable thyromegaly.  Extremities:  No clubbing, cyanosis or edema.  She moves all extremities spontaneously.  She had good strength in her legs and arms bilaterally.  Neurologic:  Exam shows no focal neurological deficits.  IMPRESSION:  [name] is a 61 year old African American female with metastatic cholangiocarcinoma.  She  is on systemic chemotherapy.  Again, she appeared to have a reaction to Phenergan.  I believe she has had Phenergan before without any problems.  It is possible that I may have just ordered too strong of a dose for her.  When she left the office, she was back to her baseline.  She was feeling well.  She was more alert and oriented x3.  She was not having nausea or vomiting.  There was no double vision or blurred vision.  She was not having any pain issues. Again, she will be back in the office tomorrow for a blood transfusion, and we will evaluate her at that  point.    ______________________________ Josph Macho, M.D. PRE/MEDQ  D:  08/29/2011  T:  08/29/2011  Job:  [id number]

## 2011-08-30 ENCOUNTER — Ambulatory Visit (HOSPITAL_BASED_OUTPATIENT_CLINIC_OR_DEPARTMENT_OTHER): Payer: Medicare Other

## 2011-08-30 ENCOUNTER — Ambulatory Visit: Payer: Medicare Other

## 2011-08-30 VITALS — BP 149/97 | HR 57 | Temp 97.9°F | Resp 16

## 2011-08-30 DIAGNOSIS — D509 Iron deficiency anemia, unspecified: Secondary | ICD-10-CM

## 2011-08-30 DIAGNOSIS — D62 Acute posthemorrhagic anemia: Secondary | ICD-10-CM

## 2011-08-30 MED ORDER — HEPARIN SOD (PORK) LOCK FLUSH 100 UNIT/ML IV SOLN
500.0000 [IU] | Freq: Once | INTRAVENOUS | Status: AC
  Administered 2011-08-30: 500 [IU] via INTRAVENOUS
  Filled 2011-08-30: qty 5

## 2011-08-30 MED ORDER — DIPHENHYDRAMINE HCL 50 MG/ML IJ SOLN: 25.0000 mg | Freq: Once | INTRAMUSCULAR | Status: DC

## 2011-08-30 MED ORDER — DIPHENHYDRAMINE HCL 25 MG PO CAPS
25.0000 mg | ORAL_CAPSULE | Freq: Once | ORAL | Status: AC
  Administered 2011-08-30: 25 mg via ORAL

## 2011-08-30 MED ORDER — FUROSEMIDE 10 MG/ML IJ SOLN
20.0000 mg | Freq: Once | INTRAMUSCULAR | Status: AC
  Administered 2011-08-30: 20 mg via INTRAVENOUS

## 2011-08-30 MED ORDER — ACETAMINOPHEN 325 MG PO TABS
650.0000 mg | ORAL_TABLET | Freq: Once | ORAL | Status: AC
  Administered 2011-08-30: 650 mg via ORAL

## 2011-08-30 MED ORDER — SODIUM CHLORIDE 0.9 % IJ SOLN
10.0000 mL | INTRAMUSCULAR | Status: DC | PRN
  Administered 2011-08-30: 10 mL via INTRAVENOUS
  Filled 2011-08-30: qty 10

## 2011-09-02 ENCOUNTER — Encounter (HOSPITAL_COMMUNITY): Payer: Medicare Other

## 2011-09-06 ENCOUNTER — Encounter (HOSPITAL_COMMUNITY): Payer: Medicare Other

## 2011-09-10 ENCOUNTER — Encounter (HOSPITAL_COMMUNITY): Payer: Medicare Other

## 2011-09-12 ENCOUNTER — Ambulatory Visit (HOSPITAL_BASED_OUTPATIENT_CLINIC_OR_DEPARTMENT_OTHER): Payer: Medicare Other | Admitting: Hematology & Oncology

## 2011-09-12 ENCOUNTER — Ambulatory Visit: Payer: Medicare Other

## 2011-09-12 ENCOUNTER — Other Ambulatory Visit (HOSPITAL_BASED_OUTPATIENT_CLINIC_OR_DEPARTMENT_OTHER): Payer: Medicare Other | Admitting: Lab

## 2011-09-12 DIAGNOSIS — D500 Iron deficiency anemia secondary to blood loss (chronic): Secondary | ICD-10-CM

## 2011-09-12 DIAGNOSIS — C7800 Secondary malignant neoplasm of unspecified lung: Secondary | ICD-10-CM

## 2011-09-12 DIAGNOSIS — C240 Malignant neoplasm of extrahepatic bile duct: Secondary | ICD-10-CM

## 2011-09-12 DIAGNOSIS — D62 Acute posthemorrhagic anemia: Secondary | ICD-10-CM

## 2011-09-12 LAB — CBC WITH DIFFERENTIAL (CANCER CENTER ONLY)
BASO#: 0 10*3/uL (ref 0.0–0.2)
Eosinophils Absolute: 0 10*3/uL (ref 0.0–0.5)
HGB: 12.1 g/dL (ref 11.6–15.9)
LYMPH%: 8.1 % — ABNORMAL LOW (ref 14.0–48.0)
MCH: 31.7 pg (ref 26.0–34.0)
MCV: 95 fL (ref 81–101)
MONO%: 16.2 % — ABNORMAL HIGH (ref 0.0–13.0)
NEUT%: 75.3 % (ref 39.6–80.0)
RBC: 3.82 10*6/uL (ref 3.70–5.32)

## 2011-09-12 NOTE — Progress Notes (Signed)
This office note has been dictated.

## 2011-09-13 ENCOUNTER — Encounter: Payer: Self-pay | Admitting: *Deleted

## 2011-09-13 ENCOUNTER — Other Ambulatory Visit: Payer: Medicare Other | Admitting: Lab

## 2011-09-13 ENCOUNTER — Ambulatory Visit: Payer: Medicare Other

## 2011-09-13 NOTE — Progress Notes (Unsigned)
Pt's daughter called asking why her mom didn't get tx on Thur and why was she getting another scan on Mon 09/16/11.  Explained that her blood counts were to low to tx and that Dr. Myna Hidalgo wanted to see where things stood with her cancer.  Donnal Debar continued to ask questions about her Mom's cancer.  Explained that by no ones fault, she has missed several tx's because of low blood counts and being in the hospital with GI bleeding and that gives cancer a chance to grow.  Finally [place] voiced understanding about her mother's condition.

## 2011-09-13 NOTE — Progress Notes (Signed)
DIAGNOSES: 1. Metastatic cholangiocarcinoma. 2. 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 3. Iron deficiency anemia secondary to gastrointestinal bleed.  CURRENT THERAPY: 1. The patient receiving cisplatin/gemcitabine, the patient to change     therapy secondary to poor marrow tolerance. 2. IV iron as indicated.  INTERIM HISTORY:  [name] comes in for follow-up.  This is probably the best that I have seen her look in a long time.  She really is doing fairly well right now.  She did get her last dose of chemotherapy with gemcitabine a couple of weeks ago.  Unfortunately, we have had a lot of delays because of marrow toxicity.  I have cut her dose back from treatment quite a bit.  I am worried that cutting her back some more will lead to less effectiveness.  She did have another endoscopy by Dr. Marina Goodell.  From what her husband says, she is healing up nicely with only a tiny area of irritation.  Her appetite is okay.  She has had no nausea or vomiting.  She has had no diarrhea.  She has had no leg swelling.  She has had occasional abdominal discomfort.  This is more on the right upper quadrant.  This could represent some scar tissue.  She has noted a lump on the right side of her ribcage.  She noticed this a couple of weeks ago.  It is slightly tender to touch.  PHYSICAL EXAMINATION:  General Appearance:  This is a well-developed, well-nourished black female in no obvious distress.  Vital Signs:  98.2, pulse 70, respiratory rate 18, blood pressure 135/90.  Weight is 158. Head and Neck Exam:  Shows a normocephalic, atraumatic skull.  There are no ocular or oral lesions.  There are no palpable cervical or supraclavicular lymph nodes.  Lungs:  Clear to percussion and auscultation bilaterally.  Cardiac Exam:  Regular rate and rhythm with a normal S1 and S2.  There are no murmurs, rubs or bruits.  Abdominal Exam:  Soft with good bowel sounds.   There is no palpable abdominal mass.  There is no fluid wave.  There is no palpable hepatosplenomegaly. Back Exam:  No tenderness over the spine, ribs or hips.  She does have a 1 x 1-cm firm nodule on the lateral right chest wall.  This is down about the I think the sixth or seventh rib.  It is slightly tender to palpation.  Extremities:  Show no clubbing, cyanosis or edema.  LABORATORY STUDIES:  White cell count is 5.7, hemoglobin 12.1, hematocrit 36.1, platelet count 103.  IMPRESSION:  [name] is a 61 year old black female with metastatic cholangiocarcinoma.  She has had treatment delays because of marrow toxicity from chemotherapy, despite dose reductions.  She also has had delays because of hospital admissions secondary to gastrointestinal bleeding.  I want to see about getting her on FOLFOX chemotherapy.  This might be a little bit better tolerated.  Hopefully, there will be some activity with FOLFOX.  FOLFIRINOX also might be an option, but I think this might be too toxic for her.  We will also repeat a CT scan on her.  We will get the CT scan set up for early next week.  Her last CA 19-9 was going up.  This, I think, is indicative of her malignancy progressing.  Her last CA 19-9 was 363 back in early January.  We will go ahead and start her on FOLFOX on February 6.  We will try  to get in four cycles if possible and then repeat her scans.    ______________________________ Josph Macho, M.D. PRE/MEDQ  D:  09/12/2011  T:  09/13/2011  Job:  [id number]

## 2011-09-16 ENCOUNTER — Ambulatory Visit (INDEPENDENT_AMBULATORY_CARE_PROVIDER_SITE_OTHER)
Admission: RE | Admit: 2011-09-16 | Discharge: 2011-09-16 | Disposition: A | Discharge status: Home / Self Care | Payer: Medicare Other | Source: Ambulatory Visit | Attending: Hematology & Oncology | Admitting: Hematology & Oncology

## 2011-09-16 ENCOUNTER — Other Ambulatory Visit: Payer: Self-pay | Admitting: Hematology & Oncology

## 2011-09-16 ENCOUNTER — Ambulatory Visit (HOSPITAL_BASED_OUTPATIENT_CLINIC_OR_DEPARTMENT_OTHER)
Admission: RE | Admit: 2011-09-16 | Discharge: 2011-09-16 | Disposition: A | Discharge status: Home / Self Care | Payer: Medicare Other | Source: Ambulatory Visit | Attending: Hematology & Oncology | Admitting: Hematology & Oncology

## 2011-09-16 ENCOUNTER — Ambulatory Visit (HOSPITAL_BASED_OUTPATIENT_CLINIC_OR_DEPARTMENT_OTHER): Payer: Medicare Other

## 2011-09-16 DIAGNOSIS — Z452 Encounter for adjustment and management of vascular access device: Secondary | ICD-10-CM

## 2011-09-16 DIAGNOSIS — Z09 Encounter for follow-up examination after completed treatment for conditions other than malignant neoplasm: Secondary | ICD-10-CM | POA: Insufficient documentation

## 2011-09-16 DIAGNOSIS — J984 Other disorders of lung: Secondary | ICD-10-CM | POA: Insufficient documentation

## 2011-09-16 DIAGNOSIS — D3500 Benign neoplasm of unspecified adrenal gland: Secondary | ICD-10-CM

## 2011-09-16 DIAGNOSIS — J90 Pleural effusion, not elsewhere classified: Secondary | ICD-10-CM

## 2011-09-16 DIAGNOSIS — C240 Malignant neoplasm of extrahepatic bile duct: Secondary | ICD-10-CM

## 2011-09-16 DIAGNOSIS — C221 Intrahepatic bile duct carcinoma: Secondary | ICD-10-CM | POA: Insufficient documentation

## 2011-09-16 DIAGNOSIS — C7800 Secondary malignant neoplasm of unspecified lung: Secondary | ICD-10-CM

## 2011-09-16 MED ORDER — IOHEXOL 300 MG/ML  SOLN
100.0000 mL | Freq: Once | INTRAMUSCULAR | Status: AC | PRN
  Administered 2011-09-16: 100 mL via INTRAVENOUS

## 2011-09-18 ENCOUNTER — Other Ambulatory Visit (HOSPITAL_BASED_OUTPATIENT_CLINIC_OR_DEPARTMENT_OTHER): Payer: Medicare Other | Admitting: Lab

## 2011-09-18 ENCOUNTER — Ambulatory Visit (HOSPITAL_BASED_OUTPATIENT_CLINIC_OR_DEPARTMENT_OTHER): Payer: Medicare Other

## 2011-09-18 DIAGNOSIS — C221 Intrahepatic bile duct carcinoma: Secondary | ICD-10-CM

## 2011-09-18 DIAGNOSIS — C7800 Secondary malignant neoplasm of unspecified lung: Secondary | ICD-10-CM

## 2011-09-18 DIAGNOSIS — Z5111 Encounter for antineoplastic chemotherapy: Secondary | ICD-10-CM

## 2011-09-18 LAB — CBC WITH DIFFERENTIAL (CANCER CENTER ONLY)
Eosinophils Absolute: 0 10*3/uL (ref 0.0–0.5)
HCT: 34.2 % — ABNORMAL LOW (ref 34.8–46.6)
LYMPH%: 11 % — ABNORMAL LOW (ref 14.0–48.0)
MCV: 96 fL (ref 81–101)
MONO#: 0.9 10*3/uL (ref 0.1–0.9)
NEUT%: 71.8 % (ref 39.6–80.0)
RDW: 15.8 % — ABNORMAL HIGH (ref 11.1–15.7)
WBC: 5.4 10*3/uL (ref 3.9–10.0)

## 2011-09-18 LAB — COMPREHENSIVE METABOLIC PANEL
BUN: 11 mg/dL (ref 6–23)
CO2: 28 mEq/L (ref 19–32)
Creatinine, Ser: 0.79 mg/dL (ref 0.50–1.10)
Glucose, Bld: 95 mg/dL (ref 70–99)
Total Bilirubin: 0.2 mg/dL — ABNORMAL LOW (ref 0.3–1.2)

## 2011-09-18 LAB — LACTATE DEHYDROGENASE: LDH: 139 U/L (ref 94–250)

## 2011-09-18 MED ORDER — SODIUM CHLORIDE 0.9 % IV SOLN
2400.0000 mg/m2 | INTRAVENOUS | Status: DC
  Administered 2011-09-18: 4300 mg via INTRAVENOUS
  Filled 2011-09-18: qty 86

## 2011-09-18 MED ORDER — DEXTROSE 5 % IV SOLN
Freq: Once | INTRAVENOUS | Status: AC
  Administered 2011-09-18: 09:00:00 via INTRAVENOUS

## 2011-09-18 MED ORDER — LEUCOVORIN CALCIUM INJECTION 350 MG
400.0000 mg/m2 | Freq: Once | INTRAVENOUS | Status: AC
  Administered 2011-09-18: 716 mg via INTRAVENOUS
  Filled 2011-09-18: qty 35.8

## 2011-09-18 MED ORDER — ONDANSETRON 8 MG/50ML IVPB (CHCC)
8.0000 mg | Freq: Once | INTRAVENOUS | Status: AC
  Administered 2011-09-18: 8 mg via INTRAVENOUS
  Filled 2011-09-18: qty 8

## 2011-09-18 MED ORDER — DEXAMETHASONE SODIUM PHOSPHATE 10 MG/ML IJ SOLN
10.0000 mg | Freq: Once | INTRAMUSCULAR | Status: AC
  Administered 2011-09-18: 10 mg via INTRAVENOUS

## 2011-09-18 MED ORDER — OXALIPLATIN CHEMO INJECTION 100 MG/20ML
85.0000 mg/m2 | Freq: Once | INTRAVENOUS | Status: AC
  Administered 2011-09-18: 150 mg via INTRAVENOUS
  Filled 2011-09-18: qty 30

## 2011-09-18 MED ORDER — FLUOROURACIL CHEMO INJECTION 2.5 GM/50ML
400.0000 mg/m2 | Freq: Once | INTRAVENOUS | Status: AC
  Administered 2011-09-18: 700 mg via INTRAVENOUS
  Filled 2011-09-18: qty 14

## 2011-09-19 ENCOUNTER — Telehealth: Payer: Self-pay | Admitting: Oncology

## 2011-09-19 ENCOUNTER — Ambulatory Visit: Payer: Medicare Other

## 2011-09-19 ENCOUNTER — Other Ambulatory Visit: Payer: Medicare Other | Admitting: Lab

## 2011-09-19 DIAGNOSIS — R112 Nausea with vomiting, unspecified: Secondary | ICD-10-CM

## 2011-09-19 MED ORDER — LIDOCAINE-PRILOCAINE 2.5-2.5 % EX CREA: TOPICAL_CREAM | CUTANEOUS | Status: DC

## 2011-09-19 MED ORDER — LORAZEPAM 0.5 MG PO TABS: 0.5000 mg | ORAL_TABLET | Freq: Four times a day (QID) | ORAL | Status: DC | PRN

## 2011-09-19 NOTE — Telephone Encounter (Signed)
Pt called requesting medication refills.  Routed to Dr. Myna Hidalgo.

## 2011-09-19 NOTE — Telephone Encounter (Signed)
Called in Ativan and Emla cream to [facility]. [name], [name] [employer]

## 2011-09-20 ENCOUNTER — Ambulatory Visit (HOSPITAL_BASED_OUTPATIENT_CLINIC_OR_DEPARTMENT_OTHER): Payer: Medicare Other

## 2011-09-20 ENCOUNTER — Encounter: Payer: Medicare Other | Admitting: Internal Medicine

## 2011-09-20 DIAGNOSIS — C221 Intrahepatic bile duct carcinoma: Secondary | ICD-10-CM

## 2011-09-23 ENCOUNTER — Encounter: Payer: Self-pay | Admitting: Internal Medicine

## 2011-09-23 ENCOUNTER — Ambulatory Visit (INDEPENDENT_AMBULATORY_CARE_PROVIDER_SITE_OTHER): Payer: Medicare Other | Admitting: Internal Medicine

## 2011-09-23 DIAGNOSIS — E785 Hyperlipidemia, unspecified: Secondary | ICD-10-CM

## 2011-09-23 DIAGNOSIS — C249 Malignant neoplasm of biliary tract, unspecified: Secondary | ICD-10-CM

## 2011-09-23 DIAGNOSIS — I10 Essential (primary) hypertension: Secondary | ICD-10-CM

## 2011-09-23 NOTE — Progress Notes (Signed)
Subjective:     Patient ID: [name], female   DOB: 30-Aug-1950, 61 y.o.   MRN: [id number]  HPI The patient is seen for a checkup today and a routine followup. She's not having any associated symptoms. She is feeling slightly tired and a little bit nauseous. Her last chemotherapy was on the Friday prior to this visit. She is still seeing the cancer Center for her chemotherapy. Her chemotherapy was recently switched. She was having a lot of side effects including low blood counts with the previous chemotherapy regimen. She does have a history of cholangiocarcinoma. She is currently being treated for hypertension, hyperlipidemia, peripheral vascular disease.  Review of Systems  Constitutional: Positive for chills and fatigue. Negative for fever, diaphoresis, activity change, appetite change and unexpected weight change.  HENT: Negative.  Negative for nosebleeds, congestion, sneezing, neck pain and neck stiffness.   Eyes: Negative.   Respiratory: Negative for cough, choking, chest tightness, shortness of breath and wheezing.   Cardiovascular: Negative for chest pain, palpitations and leg swelling.  Gastrointestinal: Positive for nausea. Negative for vomiting, abdominal pain, diarrhea and constipation.  Genitourinary: Negative.   Musculoskeletal: Positive for arthralgias. Negative for myalgias, back pain, joint swelling and gait problem.  Skin: Negative.   Neurological: Positive for weakness. Negative for dizziness, tremors, seizures, syncope, facial asymmetry, speech difficulty, light-headedness, numbness and headaches.  Hematological: Negative for adenopathy. Does not bruise/bleed easily.  Psychiatric/Behavioral: Negative.     Vitals: Blood pressure: 132/91 Pulse: 76 Temperature: 98.14F Oxygen saturation 99% on room air Weight: 160 pounds    Objective:   Physical Exam  Nursing note and vitals reviewed. Constitutional: She is oriented to person, place, and time. She appears  well-developed. No distress.  HENT:  Head: Normocephalic and atraumatic.  Eyes: EOM are normal. Pupils are equal, round, and reactive to light. No scleral icterus.  Neck: Normal range of motion. Neck supple. No tracheal deviation present. No thyromegaly present.  Cardiovascular: Normal rate and regular rhythm.   Pulmonary/Chest: Effort normal and breath sounds normal. No respiratory distress. She has no wheezes. She has no rales. She exhibits no tenderness.  Abdominal: Soft. Bowel sounds are normal. She exhibits no distension and no mass. There is no tenderness. There is no rebound and no guarding.  Musculoskeletal: Normal range of motion.  Lymphadenopathy:    She has no cervical adenopathy.  Neurological: She is alert and oriented to person, place, and time.  Skin: Skin is warm and dry. She is not diaphoretic.       Assessment/Plan:       1. Please see problem-oriented charting.  2. Disposition-patient will be seen back in 3 months for routine followup. I've advised her that if she needs any refills or has any questions or would like to be seen sooner she is free to call our office and I did provide her with our number. No changes to her medications today. Her blood pressure is under good control today.

## 2011-09-23 NOTE — Assessment & Plan Note (Signed)
Patient still getting chemotherapy per oncology. She states that there was a change in her medication and was recently started on a new medication for "colon cancer". She was having problems with low blood counts with the previous medication. Her blood counts are stable currently. Advised her to continue to follow with her oncologist.

## 2011-09-23 NOTE — Assessment & Plan Note (Signed)
Patient is still taking simvastatin daily. Is not due for lipid check. We'll see her back in 3 months.

## 2011-09-23 NOTE — Patient Instructions (Signed)
You were seen today for a follow up. Your blood pressure is well controlled today! Keep going to see the cancer doctor and we will see you back in 3 months. If you need any refills or have any questions or problems before then feel free to call us. Our number is [phone].

## 2011-09-23 NOTE — Assessment & Plan Note (Signed)
Blood pressure controlled today. No change to her medications today she is still taking metoprolol. We will see her back in 3 months.

## 2011-10-02 ENCOUNTER — Ambulatory Visit (HOSPITAL_BASED_OUTPATIENT_CLINIC_OR_DEPARTMENT_OTHER): Payer: Medicare Other

## 2011-10-02 ENCOUNTER — Other Ambulatory Visit (HOSPITAL_BASED_OUTPATIENT_CLINIC_OR_DEPARTMENT_OTHER): Payer: Medicare Other | Admitting: Lab

## 2011-10-02 ENCOUNTER — Ambulatory Visit (HOSPITAL_BASED_OUTPATIENT_CLINIC_OR_DEPARTMENT_OTHER): Payer: Medicare Other | Admitting: Hematology & Oncology

## 2011-10-02 DIAGNOSIS — C221 Intrahepatic bile duct carcinoma: Secondary | ICD-10-CM

## 2011-10-02 DIAGNOSIS — C7800 Secondary malignant neoplasm of unspecified lung: Secondary | ICD-10-CM

## 2011-10-02 DIAGNOSIS — K922 Gastrointestinal hemorrhage, unspecified: Secondary | ICD-10-CM

## 2011-10-02 DIAGNOSIS — D509 Iron deficiency anemia, unspecified: Secondary | ICD-10-CM

## 2011-10-02 DIAGNOSIS — Z5111 Encounter for antineoplastic chemotherapy: Secondary | ICD-10-CM

## 2011-10-02 LAB — CBC WITH DIFFERENTIAL (CANCER CENTER ONLY)
BASO%: 0 % (ref 0.0–2.0)
Eosinophils Absolute: 0.1 10*3/uL (ref 0.0–0.5)
MCH: 31.8 pg (ref 26.0–34.0)
MONO#: 0.9 10*3/uL (ref 0.1–0.9)
MONO%: 21.9 % — ABNORMAL HIGH (ref 0.0–13.0)
NEUT#: 2.8 10*3/uL (ref 1.5–6.5)
Platelets: 59 10*3/uL — ABNORMAL LOW (ref 145–400)
RBC: 3.27 10*6/uL — ABNORMAL LOW (ref 3.70–5.32)
RDW: 15.9 % — ABNORMAL HIGH (ref 11.1–15.7)
WBC: 4.2 10*3/uL (ref 3.9–10.0)

## 2011-10-02 LAB — COMPREHENSIVE METABOLIC PANEL
ALT: 13 U/L (ref 0–35)
AST: 23 U/L (ref 0–37)
Alkaline Phosphatase: 103 U/L (ref 39–117)
Calcium: 9 mg/dL (ref 8.4–10.5)
Chloride: 105 mEq/L (ref 96–112)
Creatinine, Ser: 0.82 mg/dL (ref 0.50–1.10)
Total Bilirubin: 0.4 mg/dL (ref 0.3–1.2)

## 2011-10-02 MED ORDER — FLUOROURACIL CHEMO INJECTION 5 GM/100ML
2400.0000 mg/m2 | INTRAVENOUS | Status: DC
  Administered 2011-10-02: 4300 mg via INTRAVENOUS
  Filled 2011-10-02: qty 86

## 2011-10-02 MED ORDER — DEXTROSE 5 % IV SOLN
Freq: Once | INTRAVENOUS | Status: AC
  Administered 2011-10-02: 12:00:00 via INTRAVENOUS

## 2011-10-02 MED ORDER — LEUCOVORIN CALCIUM INJECTION 350 MG
400.0000 mg/m2 | Freq: Once | INTRAVENOUS | Status: AC
  Administered 2011-10-02: 716 mg via INTRAVENOUS
  Filled 2011-10-02: qty 35.8

## 2011-10-02 MED ORDER — FLUOROURACIL CHEMO INJECTION 2.5 GM/50ML
400.0000 mg/m2 | Freq: Once | INTRAVENOUS | Status: AC
  Administered 2011-10-02: 700 mg via INTRAVENOUS
  Filled 2011-10-02: qty 14

## 2011-10-02 MED ORDER — LORAZEPAM 2 MG/ML IJ SOLN
1.0000 mg | Freq: Once | INTRAMUSCULAR | Status: AC
  Administered 2011-10-02: 1 mg via INTRAVENOUS

## 2011-10-02 MED ORDER — OXALIPLATIN CHEMO INJECTION 100 MG/20ML
76.5000 mg/m2 | Freq: Once | INTRAVENOUS | Status: AC
  Administered 2011-10-02: 135 mg via INTRAVENOUS
  Filled 2011-10-02: qty 27

## 2011-10-02 MED ORDER — HEPARIN SOD (PORK) LOCK FLUSH 100 UNIT/ML IV SOLN
500.0000 [IU] | Freq: Once | INTRAVENOUS | Status: DC | PRN
  Filled 2011-10-02: qty 5

## 2011-10-02 MED ORDER — ONDANSETRON 8 MG/50ML IVPB (CHCC)
8.0000 mg | Freq: Once | INTRAVENOUS | Status: AC
  Administered 2011-10-02: 8 mg via INTRAVENOUS
  Filled 2011-10-02: qty 8

## 2011-10-02 MED ORDER — DEXAMETHASONE SODIUM PHOSPHATE 10 MG/ML IJ SOLN
10.0000 mg | Freq: Once | INTRAMUSCULAR | Status: AC
  Administered 2011-10-02: 10 mg via INTRAVENOUS

## 2011-10-02 MED ORDER — SODIUM CHLORIDE 0.9 % IJ SOLN
10.0000 mL | INTRAMUSCULAR | Status: DC | PRN
  Filled 2011-10-02: qty 10

## 2011-10-02 NOTE — Progress Notes (Signed)
This office note has been dictated.

## 2011-10-03 NOTE — Progress Notes (Signed)
DIAGNOSES: 1. Metastatic cholangiocarcinoma. 2. Gastrointestinal bleeding secondary to radiation-induced gastric     ulcer. 3. Iron-deficiency anemia secondary to #2.  CURRENT THERAPY:  Status post cycle 1 of FOLFOX.  INTERIM HISTORY:  Ms. Zirbel comes in for followup. She looks a whole lot better than when we last saw her.  Actually, this is probably the best I have seen her look in several months.  She did well with the FOLFOX.  She was on cisplatin/gemcitabine.  She just had a hard time with this. We kept having delays because of anemia or leukopenia or thrombocytopenia.  She has had no bleeding.  Her appetite is improving.  She feels better. She is more active.  Her husband agrees that she is doing better.  She has had no pain issues.  She has had no nausea or vomiting.  She has had no leg swelling.  There has been no cough. Overall, perform status is ECOG 1-2.  PHYSICAL EXAM:  General: This is a well-developed, well-nourished, African American female in no obvious distress.  Vital signs: 98.2, pulse 77, respiratory rate 16, blood pressure 143/94, and weight is 168. Head and neck exam shows a normocephalic, atraumatic skull.  There are no ocular or oral lesions.  There are no palpable cervical or supraclavicular lymph nodes.  Lungs are clear bilaterally.  There are no rales or rhonchi.  Cardiac examination:  Regular rate and rhythm with a normal S1, S2.  There are no murmurs, rubs, or bruits.  Abdominal exam: Soft with good bowel sounds.  There is no palpable abdominal mass. There is no fluid wave.  There is no palpable hepatosplenomegaly. She has a well-healed laparotomy wound.  Extremities: Shows no clubbing, cyanosis, or edema.  She has good range of motion of her joints.  Back exam: No tenderness over the spine.  Neurological exam: Shows no focal neurological deficits.  LABORATORY STUDIES:  White cell count is 4.2, hemoglobin 10.4, hematocrit 30.9, and platelet count  60,000.  IMPRESSION:  Ms. Yodice is a 61 year old African American female with metastatic cholangiocarcinoma.  She actually presented with locally advanced disease back in March of 2012.  She went to [facility].  She underwent a major resection of the tumor.  We did give her adjuvant therapy. Unfortunately, she then was found to have progressive disease.  She is on second-line therapy now with FOLFOX.  Again, she just cannot tolerate cisplatin/Gemzar, even with dose reductions.  We will go ahead and plan to treat her today.  Her monocytes up to 22%. As such, I think her thrombocytopenia is improving.  I will cut the oxaliplatin dose back a little bit.  We will plan to get her back to see me in another 2 weeks.  Hopefully, we will find that her tumor marker is coming down.    ______________________________ Josph Macho, M.D. PRE/MEDQ  D:  10/02/2011  T:  10/03/2011  Job:  [id number]

## 2011-10-04 ENCOUNTER — Ambulatory Visit (HOSPITAL_BASED_OUTPATIENT_CLINIC_OR_DEPARTMENT_OTHER): Payer: Medicare Other

## 2011-10-04 DIAGNOSIS — C221 Intrahepatic bile duct carcinoma: Secondary | ICD-10-CM

## 2011-10-04 DIAGNOSIS — C7800 Secondary malignant neoplasm of unspecified lung: Secondary | ICD-10-CM

## 2011-10-04 DIAGNOSIS — C169 Malignant neoplasm of stomach, unspecified: Secondary | ICD-10-CM

## 2011-10-04 MED ORDER — HEPARIN SOD (PORK) LOCK FLUSH 100 UNIT/ML IV SOLN
500.0000 [IU] | Freq: Once | INTRAVENOUS | Status: AC
  Administered 2011-10-04: 500 [IU] via INTRAVENOUS
  Filled 2011-10-04: qty 5

## 2011-10-04 MED ORDER — SODIUM CHLORIDE 0.9 % IJ SOLN
10.0000 mL | INTRAMUSCULAR | Status: DC | PRN
  Administered 2011-10-04: 10 mL via INTRAVENOUS
  Filled 2011-10-04: qty 10

## 2011-10-16 ENCOUNTER — Other Ambulatory Visit (HOSPITAL_BASED_OUTPATIENT_CLINIC_OR_DEPARTMENT_OTHER): Payer: Medicare Other | Admitting: Lab

## 2011-10-16 ENCOUNTER — Ambulatory Visit (HOSPITAL_BASED_OUTPATIENT_CLINIC_OR_DEPARTMENT_OTHER): Payer: Medicare Other | Admitting: Hematology & Oncology

## 2011-10-16 ENCOUNTER — Ambulatory Visit (HOSPITAL_BASED_OUTPATIENT_CLINIC_OR_DEPARTMENT_OTHER): Payer: Medicare Other

## 2011-10-16 DIAGNOSIS — C221 Intrahepatic bile duct carcinoma: Secondary | ICD-10-CM

## 2011-10-16 DIAGNOSIS — C7800 Secondary malignant neoplasm of unspecified lung: Secondary | ICD-10-CM

## 2011-10-16 DIAGNOSIS — Z5111 Encounter for antineoplastic chemotherapy: Secondary | ICD-10-CM

## 2011-10-16 LAB — COMPREHENSIVE METABOLIC PANEL
ALT: 13 U/L (ref 0–35)
AST: 22 U/L (ref 0–37)
Alkaline Phosphatase: 96 U/L (ref 39–117)
BUN: 15 mg/dL (ref 6–23)
Calcium: 8.7 mg/dL (ref 8.4–10.5)
Creatinine, Ser: 0.92 mg/dL (ref 0.50–1.10)
Total Bilirubin: 0.3 mg/dL (ref 0.3–1.2)

## 2011-10-16 LAB — CBC WITH DIFFERENTIAL (CANCER CENTER ONLY)
BASO#: 0 10*3/uL (ref 0.0–0.2)
BASO%: 0.4 % (ref 0.0–2.0)
EOS%: 1.5 % (ref 0.0–7.0)
HCT: 31.7 % — ABNORMAL LOW (ref 34.8–46.6)
HGB: 10.8 g/dL — ABNORMAL LOW (ref 11.6–15.9)
LYMPH%: 11.6 % — ABNORMAL LOW (ref 14.0–48.0)
MCH: 32.2 pg (ref 26.0–34.0)
MCHC: 34.1 g/dL (ref 32.0–36.0)
MONO%: 15.3 % — ABNORMAL HIGH (ref 0.0–13.0)
NEUT%: 71.2 % (ref 39.6–80.0)
RDW: 16.3 % — ABNORMAL HIGH (ref 11.1–15.7)

## 2011-10-16 MED ORDER — LORAZEPAM 2 MG/ML IJ SOLN
0.5000 mg | Freq: Once | INTRAMUSCULAR | Status: AC
  Administered 2011-10-16: 0.5 mg via INTRAVENOUS

## 2011-10-16 MED ORDER — DEXAMETHASONE SODIUM PHOSPHATE 10 MG/ML IJ SOLN
10.0000 mg | Freq: Once | INTRAMUSCULAR | Status: AC
  Administered 2011-10-16: 10 mg via INTRAVENOUS

## 2011-10-16 MED ORDER — OXALIPLATIN CHEMO INJECTION 100 MG/20ML
76.5000 mg/m2 | Freq: Once | INTRAVENOUS | Status: AC
  Administered 2011-10-16: 135 mg via INTRAVENOUS
  Filled 2011-10-16: qty 27

## 2011-10-16 MED ORDER — FLUOROURACIL CHEMO INJECTION 2.5 GM/50ML
400.0000 mg/m2 | Freq: Once | INTRAVENOUS | Status: AC
  Administered 2011-10-16: 700 mg via INTRAVENOUS
  Filled 2011-10-16: qty 14

## 2011-10-16 MED ORDER — HEPARIN SOD (PORK) LOCK FLUSH 100 UNIT/ML IV SOLN
500.0000 [IU] | Freq: Once | INTRAVENOUS | Status: DC | PRN
  Filled 2011-10-16: qty 5

## 2011-10-16 MED ORDER — LEUCOVORIN CALCIUM INJECTION 350 MG
400.0000 mg/m2 | Freq: Once | INTRAVENOUS | Status: AC
  Administered 2011-10-16: 716 mg via INTRAVENOUS
  Filled 2011-10-16: qty 35.8

## 2011-10-16 MED ORDER — FLUOROURACIL CHEMO INJECTION 5 GM/100ML
2400.0000 mg/m2 | INTRAVENOUS | Status: DC
  Administered 2011-10-16: 4300 mg via INTRAVENOUS
  Filled 2011-10-16: qty 86

## 2011-10-16 MED ORDER — SODIUM CHLORIDE 0.9 % IJ SOLN
10.0000 mL | INTRAMUSCULAR | Status: DC | PRN
  Filled 2011-10-16: qty 10

## 2011-10-16 MED ORDER — ONDANSETRON 8 MG/50ML IVPB (CHCC)
8.0000 mg | Freq: Once | INTRAVENOUS | Status: AC
  Administered 2011-10-16: 8 mg via INTRAVENOUS
  Filled 2011-10-16: qty 8

## 2011-10-16 MED ORDER — DEXTROSE 5 % IV SOLN
Freq: Once | INTRAVENOUS | Status: AC
  Administered 2011-10-16: 09:00:00 via INTRAVENOUS

## 2011-10-16 NOTE — Progress Notes (Signed)
Mrs. Sanborn feels well and tolerated chemotherapy quite nicely so far.  There is no nausea vomiting. Her appetite is better. She's had no bleeding. Has had issues with GI bleeding from a ulcer in the stomach. This has been taking care of quite nicely by Dr. Madilyn Fireman and [facility] had no cough or shortness of breath. There's been no diarrhea. She's had no rashes. There's been no leg swelling.  Her physical exam shows a well-developed well-nourished black female in no obvious distress. Vital signs 97 1 pulse 78 responders 60 blood pressure 153/104 no is 160 head and neck exam shows no scleral icterus. There is no because size. There is no adenopathy in the neck. Lungs are clear bilaterally. Cardiac exam regular rate and rhythm with a normal S1-S2. There are no murmurs rubs or bruits. Abdominal exam shows a well healed laparotomy scars. She has no tenderness. There is no ascites. There is no abdominal mass. There is no fluid or wave. Extremities shows no clubbing cyanosis or edema.  Laboratory studies wasn't count 5.2 below 10.8 hematocrit 32 platelet count 55.  We will go ahead and treat her with chemotherapy today. Again, she is tolerating chemotherapy quite well. Her performance status is improving.  Her platelets are low but I believe her marrow is recovering so we should be okay. The fact that her hemoglobin is stable indicates that there is no bleeding in the GI tract  We will get her back to see [place] in another couple weeks for followup.  1 Chronicles 28:20  Hewitt Shorts

## 2011-10-18 ENCOUNTER — Ambulatory Visit (HOSPITAL_BASED_OUTPATIENT_CLINIC_OR_DEPARTMENT_OTHER): Payer: Medicare Other

## 2011-10-18 DIAGNOSIS — C221 Intrahepatic bile duct carcinoma: Secondary | ICD-10-CM

## 2011-10-18 DIAGNOSIS — C249 Malignant neoplasm of biliary tract, unspecified: Secondary | ICD-10-CM

## 2011-10-18 DIAGNOSIS — C7800 Secondary malignant neoplasm of unspecified lung: Secondary | ICD-10-CM

## 2011-10-18 DIAGNOSIS — Z452 Encounter for adjustment and management of vascular access device: Secondary | ICD-10-CM

## 2011-10-18 MED ORDER — HEPARIN SOD (PORK) LOCK FLUSH 100 UNIT/ML IV SOLN
500.0000 [IU] | Freq: Once | INTRAVENOUS | Status: AC
  Administered 2011-10-18: 500 [IU] via INTRAVENOUS
  Filled 2011-10-18: qty 5

## 2011-10-18 MED ORDER — SODIUM CHLORIDE 0.9 % IJ SOLN
10.0000 mL | Freq: Once | INTRAMUSCULAR | Status: AC
  Administered 2011-10-18: 10 mL via INTRAVENOUS
  Filled 2011-10-18: qty 10

## 2011-10-30 ENCOUNTER — Ambulatory Visit (HOSPITAL_BASED_OUTPATIENT_CLINIC_OR_DEPARTMENT_OTHER): Payer: Medicare Other

## 2011-10-30 ENCOUNTER — Ambulatory Visit (HOSPITAL_BASED_OUTPATIENT_CLINIC_OR_DEPARTMENT_OTHER): Payer: Medicare Other | Admitting: Hematology & Oncology

## 2011-10-30 ENCOUNTER — Other Ambulatory Visit (HOSPITAL_BASED_OUTPATIENT_CLINIC_OR_DEPARTMENT_OTHER): Payer: Medicare Other | Admitting: Lab

## 2011-10-30 ENCOUNTER — Ambulatory Visit: Payer: Medicare Other | Admitting: Hematology & Oncology

## 2011-10-30 VITALS — BP 143/95 | HR 61 | Temp 97.0°F | Ht 63.0 in | Wt 159.0 lb

## 2011-10-30 DIAGNOSIS — C249 Malignant neoplasm of biliary tract, unspecified: Secondary | ICD-10-CM

## 2011-10-30 DIAGNOSIS — C221 Intrahepatic bile duct carcinoma: Secondary | ICD-10-CM

## 2011-10-30 DIAGNOSIS — C7800 Secondary malignant neoplasm of unspecified lung: Secondary | ICD-10-CM

## 2011-10-30 DIAGNOSIS — Z5111 Encounter for antineoplastic chemotherapy: Secondary | ICD-10-CM

## 2011-10-30 LAB — COMPREHENSIVE METABOLIC PANEL
ALT: 16 U/L (ref 0–35)
AST: 22 U/L (ref 0–37)
Albumin: 3.4 g/dL — ABNORMAL LOW (ref 3.5–5.2)
BUN: 16 mg/dL (ref 6–23)
CO2: 23 mEq/L (ref 19–32)
Calcium: 8.5 mg/dL (ref 8.4–10.5)
Chloride: 110 mEq/L (ref 96–112)
Potassium: 3.5 mEq/L (ref 3.5–5.3)

## 2011-10-30 LAB — CBC WITH DIFFERENTIAL (CANCER CENTER ONLY)
BASO#: 0 10*3/uL (ref 0.0–0.2)
BASO%: 0 % (ref 0.0–2.0)
EOS%: 1.3 % (ref 0.0–7.0)
HCT: 28.9 % — ABNORMAL LOW (ref 34.8–46.6)
HGB: 9.7 g/dL — ABNORMAL LOW (ref 11.6–15.9)
LYMPH#: 0.4 10*3/uL — ABNORMAL LOW (ref 0.9–3.3)
MCHC: 33.6 g/dL (ref 32.0–36.0)
MONO#: 0.8 10*3/uL (ref 0.1–0.9)
NEUT#: 1.9 10*3/uL (ref 1.5–6.5)
NEUT%: 61.1 % (ref 39.6–80.0)
RBC: 2.99 10*6/uL — ABNORMAL LOW (ref 3.70–5.32)
WBC: 3.2 10*3/uL — ABNORMAL LOW (ref 3.9–10.0)

## 2011-10-30 MED ORDER — SODIUM CHLORIDE 0.9 % IV SOLN
2400.0000 mg/m2 | INTRAVENOUS | Status: DC
  Administered 2011-10-30: 4300 mg via INTRAVENOUS
  Filled 2011-10-30: qty 86

## 2011-10-30 MED ORDER — DEXAMETHASONE SODIUM PHOSPHATE 10 MG/ML IJ SOLN
10.0000 mg | Freq: Once | INTRAMUSCULAR | Status: AC
  Administered 2011-10-30: 10 mg via INTRAVENOUS

## 2011-10-30 MED ORDER — FLUOROURACIL CHEMO INJECTION 2.5 GM/50ML
400.0000 mg/m2 | Freq: Once | INTRAVENOUS | Status: AC
  Administered 2011-10-30: 700 mg via INTRAVENOUS
  Filled 2011-10-30: qty 14

## 2011-10-30 MED ORDER — ONDANSETRON 8 MG/50ML IVPB (CHCC)
8.0000 mg | Freq: Once | INTRAVENOUS | Status: AC
  Administered 2011-10-30: 8 mg via INTRAVENOUS

## 2011-10-30 MED ORDER — SODIUM CHLORIDE 0.9 % IJ SOLN
10.0000 mL | INTRAMUSCULAR | Status: DC | PRN
  Filled 2011-10-30: qty 10

## 2011-10-30 MED ORDER — OXALIPLATIN CHEMO INJECTION 100 MG/20ML
76.5000 mg/m2 | Freq: Once | INTRAVENOUS | Status: AC
  Administered 2011-10-30: 135 mg via INTRAVENOUS
  Filled 2011-10-30: qty 27

## 2011-10-30 MED ORDER — DEXTROSE 5 % IV SOLN
Freq: Once | INTRAVENOUS | Status: AC
  Administered 2011-10-30: 10:00:00 via INTRAVENOUS

## 2011-10-30 MED ORDER — HEPARIN SOD (PORK) LOCK FLUSH 100 UNIT/ML IV SOLN
500.0000 [IU] | Freq: Once | INTRAVENOUS | Status: DC | PRN
  Filled 2011-10-30: qty 5

## 2011-10-30 MED ORDER — LEUCOVORIN CALCIUM INJECTION 350 MG
400.0000 mg/m2 | Freq: Once | INTRAVENOUS | Status: AC
  Administered 2011-10-30: 716 mg via INTRAVENOUS
  Filled 2011-10-30: qty 35.8

## 2011-10-30 MED ORDER — LORAZEPAM 2 MG/ML IJ SOLN
1.0000 mg | Freq: Once | INTRAMUSCULAR | Status: AC
  Administered 2011-10-30: 1 mg via INTRAVENOUS

## 2011-10-30 NOTE — Progress Notes (Signed)
This office note has been dictated.

## 2011-10-30 NOTE — Patient Instructions (Signed)
While on Oxaliplatin or Eloxatin, please avoid exposure to cold.  This will decrease the acute sensory neuropathy associated with this drug.  Avoid ice cream, touching cold surfaces,breathing the cold air(even air conditioning on a hot day).  [facility] Discharge Instructions for Patients Receiving Chemotherapy  To help prevent nausea and vomiting after your treatment, we encourage you to take your nausea medication as prescribed.  If you develop nausea and vomiting that is not controlled by your nausea medication, call the clinic. If it is after clinic hours your family physician or the after hours number for the clinic or go to the Emergency Department.   BELOW ARE SYMPTOMS THAT SHOULD BE REPORTED IMMEDIATELY:  *FEVER GREATER THAN 101.0 F  *CHILLS WITH OR WITHOUT FEVER  NAUSEA AND VOMITING THAT IS NOT CONTROLLED WITH YOUR NAUSEA MEDICATION  *UNUSUAL SHORTNESS OF BREATH  *UNUSUAL BRUISING OR BLEEDING  TENDERNESS IN MOUTH AND THROAT WITH OR WITHOUT PRESENCE OF ULCERS  *URINARY PROBLEMS  *BOWEL PROBLEMS  UNUSUAL RASH Items with * indicate a potential emergency and should be followed up as soon as possible.    I have been informed and understand all the instructions given to me. I know to contact the clinic, my physician, or go to the Emergency Department if any problems should occur. I do not have any questions at this time, but understand that I may call the clinic during office hours or the Patient Navigator at [phone] should I have any questions or need assistance in obtaining follow up care.    __________________________________________  _____________  __________ Signature of Patient or Authorized Representative            Date                   Time    __________________________________________ Nurse's Signature

## 2011-10-31 ENCOUNTER — Telehealth: Payer: Self-pay | Admitting: Hematology & Oncology

## 2011-10-31 NOTE — Progress Notes (Signed)
DIAGNOSIS:  Metastatic cholangiocarcinoma.  CURRENT THERAPY:  She is status post 3 cycles of FOLFOX.  INTERIM HISTORY:  [name] comes in for followup.  She continues to look good.  She is actually doing quite nicely.  She has tolerated chemotherapy with FOLFOX very well.  She has done much better with this than with cisplatin/gemcitabine.  So far, we have not had any delays.  She has had no GI bleeding.  There has been no melena or bright red blood per rectum.  Her appetite continues to improve.  Her weight is holding relatively stable.  There has been no pain.  She has had no cough.  There has been no leg swelling.  PHYSICAL EXAMINATION:  General:  This is a well-developed, well- nourished black female in no obvious distress.  Vital Signs:  97.1, pulse 61, respiratory rate 16, blood pressure 143/95.  Weight is 159. Head and Neck Exam:  Shows a normocephalic, atraumatic skull.  There are no ocular or oral lesions.  There are no palpable cervical or supraclavicular lymph nodes.  Lungs:  Clear bilaterally.  Cardiac Exam: Regular rate and rhythm with normal S1 and S2.  There are no murmurs, rubs or bruits.  Abdominal Exam:  Shows a soft abdomen.  She has well- healed laparotomy scars.  There is no fluid wave.  There is no guarding or rebound tenderness.  There is no palpable hepatosplenomegaly.  Back Exam:  No tenderness over the spine, ribs or hips.  Extremities:  Show no clubbing, cyanosis, or edema.  Skin Exam:  No rashes, ecchymosis or petechiae.  LABORATORY STUDIES:  White cell count is 3.2, hemoglobin 9.7, hematocrit 29, platelet count 48,000.  Her metabolic panel is relatively unremarkable.  Liver function tests are normal.  IMPRESSION:  [name] is a 61 year old African American female with metastatic cholangiocarcinoma.  She is on second-line therapy with FOLFOX.  Again, clinically, she is looking good, so I would like to think that she is responding.  This is her 4th  cycle of FOLFOX.  I will plan for a followup CT scan in about 2 weeks.  I think we can treat her today as her monocytes are up quite a bit, so this shows that her marrow is recovering.  I will see her back in 3 weeks.  If her scans continue to show a response, then we will continue her on treatment.    ______________________________ Josph Macho, M.D. PRE/MEDQ  D:  10/30/2011  T:  10/31/2011  Job:  [id number]

## 2011-10-31 NOTE — Telephone Encounter (Signed)
Pt aware to pick up April and may schedule 3-22 and to get contrast and instructions for 4-3 CT

## 2011-11-01 ENCOUNTER — Ambulatory Visit (HOSPITAL_BASED_OUTPATIENT_CLINIC_OR_DEPARTMENT_OTHER): Payer: Medicare Other

## 2011-11-01 DIAGNOSIS — C221 Intrahepatic bile duct carcinoma: Secondary | ICD-10-CM

## 2011-11-01 DIAGNOSIS — C7800 Secondary malignant neoplasm of unspecified lung: Secondary | ICD-10-CM

## 2011-11-01 NOTE — Patient Instructions (Signed)
Fluorouracil, 5-FU injection What is this medicine? FLUOROURACIL, 5-FU (flure oh YOOR a sil) is a chemotherapy drug. It slows the growth of cancer cells. This medicine is used to treat many types of cancer like breast cancer, colon or rectal cancer, pancreatic cancer, and stomach cancer. This medicine may be used for other purposes; ask your health care provider or pharmacist if you have questions. What should I tell my health care provider before I take this medicine? They need to know if you have any of these conditions: -blood disorders -dihydropyrimidine dehydrogenase (DPD) deficiency -infection (especially a virus infection such as chickenpox, cold sores, or herpes) -kidney disease -liver disease -malnourished, poor nutrition -recent or ongoing radiation therapy -an unusual or allergic reaction to fluorouracil, other chemotherapy, other medicines, foods, dyes, or preservatives -pregnant or trying to get pregnant -breast-feeding How should I use this medicine? This drug is given as an infusion or injection into a vein. It is administered in a hospital or clinic by a specially trained health care professional. Talk to your pediatrician regarding the use of this medicine in children. Special care may be needed. Overdosage: If you think you have taken too much of this medicine contact a poison control center or emergency room at once. NOTE: This medicine is only for you. Do not share this medicine with others. What if I miss a dose? It is important not to miss your dose. Call your doctor or health care professional if you are unable to keep an appointment. What may interact with this medicine? -allopurinol -cimetidine -dapsone -digoxin -hydroxyurea -leucovorin -levamisole -medicines for seizures like ethotoin, fosphenytoin, phenytoin -medicines to increase blood counts like filgrastim, pegfilgrastim, sargramostim -medicines that treat or prevent blood clots like warfarin,  enoxaparin, and dalteparin -methotrexate -metronidazole -pyrimethamine -some other chemotherapy drugs like busulfan, cisplatin, estramustine, vinblastine -trimethoprim -trimetrexate -vaccines Talk to your doctor or health care professional before taking any of these medicines: -acetaminophen -aspirin -ibuprofen -ketoprofen -naproxen This list may not describe all possible interactions. Give your health care provider a list of all the medicines, herbs, non-prescription drugs, or dietary supplements you use. Also tell them if you smoke, drink alcohol, or use illegal drugs. Some items may interact with your medicine. What should I watch for while using this medicine? Visit your doctor for checks on your progress. This drug may make you feel generally unwell. This is not uncommon, as chemotherapy can affect healthy cells as well as cancer cells. Report any side effects. Continue your course of treatment even though you feel ill unless your doctor tells you to stop. In some cases, you may be given additional medicines to help with side effects. Follow all directions for their use. Call your doctor or health care professional for advice if you get a fever, chills or sore throat, or other symptoms of a cold or flu. Do not treat yourself. This drug decreases your body's ability to fight infections. Try to avoid being around people who are sick. This medicine may increase your risk to bruise or bleed. Call your doctor or health care professional if you notice any unusual bleeding. Be careful brushing and flossing your teeth or using a toothpick because you may get an infection or bleed more easily. If you have any dental work done, tell your dentist you are receiving this medicine. Avoid taking products that contain aspirin, acetaminophen, ibuprofen, naproxen, or ketoprofen unless instructed by your doctor. These medicines may hide a fever. Do not become pregnant while taking this medicine. Women should    inform their doctor if they wish to become pregnant or think they might be pregnant. There is a potential for serious side effects to an unborn child. Talk to your health care professional or pharmacist for more information. Do not breast-feed an infant while taking this medicine. Men should inform their doctor if they wish to father a child. This medicine may lower sperm counts. Do not treat diarrhea with over the counter products. Contact your doctor if you have diarrhea that lasts more than 2 days or if it is severe and watery. This medicine can make you more sensitive to the sun. Keep out of the sun. If you cannot avoid being in the sun, wear protective clothing and use sunscreen. Do not use sun lamps or tanning beds/booths. What side effects may I notice from receiving this medicine? Side effects that you should report to your doctor or health care professional as soon as possible: -allergic reactions like skin rash, itching or hives, swelling of the face, lips, or tongue -low blood counts - this medicine may decrease the number of white blood cells, red blood cells and platelets. You may be at increased risk for infections and bleeding. -signs of infection - fever or chills, cough, sore throat, pain or difficulty passing urine -signs of decreased platelets or bleeding - bruising, pinpoint red spots on the skin, black, tarry stools, blood in the urine -signs of decreased red blood cells - unusually weak or tired, fainting spells, lightheadedness -breathing problems -changes in vision -chest pain -mouth sores -nausea and vomiting -pain, swelling, redness at site where injected -pain, tingling, numbness in the hands or feet -redness, swelling, or sores on hands or feet -stomach pain -unusual bleeding Side effects that usually do not require medical attention (report to your doctor or health care professional if they continue or are bothersome): -changes in finger or toe  nails -diarrhea -dry or itchy skin -hair loss -headache -loss of appetite -sensitivity of eyes to the light -stomach upset -unusually teary eyes This list may not describe all possible side effects. Call your doctor for medical advice about side effects. You may report side effects to FDA at 1-800-FDA-1088. Where should I keep my medicine? This drug is given in a hospital or clinic and will not be stored at home. NOTE: This sheet is a summary. It may not cover all possible information. If you have questions about this medicine, talk to your doctor, pharmacist, or health care provider.  2012, [employer]. (12/02/2007 1:53:16 PM) 

## 2011-11-12 ENCOUNTER — Other Ambulatory Visit: Payer: Self-pay | Admitting: *Deleted

## 2011-11-12 NOTE — Progress Notes (Signed)
Pt called requesting to have her port accessed prior to her CT Scan tomorrow. Ok to come in at [facility] per Alvino Chapel to have it accessed prior to her 0800 arrival time for her scan.

## 2011-11-13 ENCOUNTER — Ambulatory Visit (HOSPITAL_BASED_OUTPATIENT_CLINIC_OR_DEPARTMENT_OTHER): Payer: Medicare Other

## 2011-11-13 ENCOUNTER — Ambulatory Visit (HOSPITAL_BASED_OUTPATIENT_CLINIC_OR_DEPARTMENT_OTHER)
Admission: RE | Admit: 2011-11-13 | Discharge: 2011-11-13 | Disposition: A | Discharge status: Home / Self Care | Payer: Medicare Other | Source: Ambulatory Visit | Attending: Hematology & Oncology | Admitting: Hematology & Oncology

## 2011-11-13 DIAGNOSIS — J90 Pleural effusion, not elsewhere classified: Secondary | ICD-10-CM

## 2011-11-13 DIAGNOSIS — C249 Malignant neoplasm of biliary tract, unspecified: Secondary | ICD-10-CM

## 2011-11-13 DIAGNOSIS — Z9221 Personal history of antineoplastic chemotherapy: Secondary | ICD-10-CM | POA: Insufficient documentation

## 2011-11-13 DIAGNOSIS — Z9089 Acquired absence of other organs: Secondary | ICD-10-CM

## 2011-11-13 DIAGNOSIS — C221 Intrahepatic bile duct carcinoma: Secondary | ICD-10-CM

## 2011-11-13 DIAGNOSIS — Z452 Encounter for adjustment and management of vascular access device: Secondary | ICD-10-CM

## 2011-11-13 DIAGNOSIS — C7800 Secondary malignant neoplasm of unspecified lung: Secondary | ICD-10-CM

## 2011-11-13 MED ORDER — IOHEXOL 300 MG/ML  SOLN
100.0000 mL | Freq: Once | INTRAMUSCULAR | Status: AC | PRN
  Administered 2011-11-13: 100 mL via INTRAVENOUS

## 2011-11-13 MED ORDER — HEPARIN SOD (PORK) LOCK FLUSH 100 UNIT/ML IV SOLN
500.0000 [IU] | Freq: Once | INTRAVENOUS | Status: AC
  Administered 2011-11-13: 500 [IU] via INTRAVENOUS
  Filled 2011-11-13: qty 5

## 2011-11-13 MED ORDER — SODIUM CHLORIDE 0.9 % IJ SOLN
10.0000 mL | INTRAMUSCULAR | Status: DC | PRN
  Administered 2011-11-13: 10 mL via INTRAVENOUS
  Filled 2011-11-13: qty 10

## 2011-11-13 NOTE — Progress Notes (Signed)
Port cath flush this morning. Good blood return noted.

## 2011-11-19 ENCOUNTER — Telehealth: Payer: Self-pay | Admitting: *Deleted

## 2011-11-19 NOTE — Telephone Encounter (Addendum)
Message copied by Mirian Capuchin on Tue Nov 19, 2011  8:14 AM ------      Message from: Josph Macho      Created: Sun Nov 17, 2011  2:06 PM       Call -- cancer NOT growing!!  This is good!!!  Cindee Lame E, This message given to pt who voiced understanding.

## 2011-11-20 ENCOUNTER — Other Ambulatory Visit (HOSPITAL_BASED_OUTPATIENT_CLINIC_OR_DEPARTMENT_OTHER): Payer: Medicare Other | Admitting: Lab

## 2011-11-20 ENCOUNTER — Ambulatory Visit (HOSPITAL_BASED_OUTPATIENT_CLINIC_OR_DEPARTMENT_OTHER): Payer: Medicare Other | Admitting: Hematology & Oncology

## 2011-11-20 ENCOUNTER — Ambulatory Visit (HOSPITAL_BASED_OUTPATIENT_CLINIC_OR_DEPARTMENT_OTHER): Payer: Medicare Other

## 2011-11-20 VITALS — BP 112/76 | HR 76 | Temp 98.2°F | Ht 63.0 in | Wt 160.0 lb

## 2011-11-20 DIAGNOSIS — D649 Anemia, unspecified: Secondary | ICD-10-CM

## 2011-11-20 DIAGNOSIS — C221 Intrahepatic bile duct carcinoma: Secondary | ICD-10-CM

## 2011-11-20 DIAGNOSIS — C249 Malignant neoplasm of biliary tract, unspecified: Secondary | ICD-10-CM

## 2011-11-20 DIAGNOSIS — Z5111 Encounter for antineoplastic chemotherapy: Secondary | ICD-10-CM

## 2011-11-20 DIAGNOSIS — D6481 Anemia due to antineoplastic chemotherapy: Secondary | ICD-10-CM

## 2011-11-20 LAB — CBC WITH DIFFERENTIAL (CANCER CENTER ONLY)
BASO#: 0 10*3/uL (ref 0.0–0.2)
BASO%: 0 % (ref 0.0–2.0)
HCT: 24.2 % — ABNORMAL LOW (ref 34.8–46.6)
HGB: 8.1 g/dL — ABNORMAL LOW (ref 11.6–15.9)
LYMPH#: 0.5 10*3/uL — ABNORMAL LOW (ref 0.9–3.3)
MONO#: 1.1 10*3/uL — ABNORMAL HIGH (ref 0.1–0.9)
NEUT#: 1.7 10*3/uL (ref 1.5–6.5)
NEUT%: 53.1 % (ref 39.6–80.0)
RBC: 2.44 10*6/uL — ABNORMAL LOW (ref 3.70–5.32)
WBC: 3.3 10*3/uL — ABNORMAL LOW (ref 3.9–10.0)

## 2011-11-20 LAB — TECHNOLOGIST REVIEW CHCC SATELLITE

## 2011-11-20 MED ORDER — SODIUM CHLORIDE 0.9 % IV SOLN
2400.0000 mg/m2 | INTRAVENOUS | Status: DC
  Administered 2011-11-20: 4300 mg via INTRAVENOUS
  Filled 2011-11-20: qty 86

## 2011-11-20 MED ORDER — DEXTROSE 5 % IV SOLN: Freq: Once | INTRAVENOUS | Status: DC

## 2011-11-20 MED ORDER — FLUOROURACIL CHEMO INJECTION 2.5 GM/50ML
400.0000 mg/m2 | Freq: Once | INTRAVENOUS | Status: AC
  Administered 2011-11-20: 700 mg via INTRAVENOUS
  Filled 2011-11-20: qty 14

## 2011-11-20 MED ORDER — HEPARIN SOD (PORK) LOCK FLUSH 100 UNIT/ML IV SOLN
500.0000 [IU] | Freq: Once | INTRAVENOUS | Status: AC | PRN
  Filled 2011-11-20: qty 5

## 2011-11-20 MED ORDER — OXALIPLATIN CHEMO INJECTION 100 MG/20ML
76.5000 mg/m2 | Freq: Once | INTRAVENOUS | Status: AC
  Administered 2011-11-20: 135 mg via INTRAVENOUS
  Filled 2011-11-20: qty 27

## 2011-11-20 MED ORDER — DEXAMETHASONE SODIUM PHOSPHATE 10 MG/ML IJ SOLN
10.0000 mg | Freq: Once | INTRAMUSCULAR | Status: AC
  Administered 2011-11-20: 10 mg via INTRAVENOUS

## 2011-11-20 MED ORDER — ONDANSETRON 8 MG/50ML IVPB (CHCC)
8.0000 mg | Freq: Once | INTRAVENOUS | Status: AC
  Administered 2011-11-20: 8 mg via INTRAVENOUS

## 2011-11-20 MED ORDER — LEUCOVORIN CALCIUM INJECTION 350 MG
400.0000 mg/m2 | Freq: Once | INTRAVENOUS | Status: AC
  Administered 2011-11-20: 716 mg via INTRAVENOUS
  Filled 2011-11-20: qty 35.8

## 2011-11-20 MED ORDER — SODIUM CHLORIDE 0.9 % IJ SOLN
10.0000 mL | INTRAMUSCULAR | Status: DC | PRN
  Filled 2011-11-20: qty 10

## 2011-11-20 NOTE — Progress Notes (Signed)
This office note has been dictated.

## 2011-11-21 LAB — COMPREHENSIVE METABOLIC PANEL
ALT: 18 U/L (ref 0–35)
Albumin: 3.1 g/dL — ABNORMAL LOW (ref 3.5–5.2)
BUN: 12 mg/dL (ref 6–23)
CO2: 23 mEq/L (ref 19–32)
Calcium: 8.9 mg/dL (ref 8.4–10.5)
Chloride: 111 mEq/L (ref 96–112)
Creatinine, Ser: 0.77 mg/dL (ref 0.50–1.10)
Potassium: 3.7 mEq/L (ref 3.5–5.3)

## 2011-11-21 LAB — IRON AND TIBC
%SAT: 50 % (ref 20–55)
Iron: 118 ug/dL (ref 42–145)

## 2011-11-21 LAB — LACTATE DEHYDROGENASE: LDH: 218 U/L (ref 94–250)

## 2011-11-21 NOTE — Progress Notes (Signed)
DIAGNOSES:  Metastatic cholangiocarcinoma.  CURRENT THERAPY:  The patient is status post 4 cycles of 4 FOLFOX.  INTERIM HISTORY:  [name] comes in for followup.  She feels great. She has had no problems pain-wise.  There is really no fatigue.  She has not noticed any additional bleeding.  She does not need to see Dr. Marina Goodell at this point in time.  We did go ahead and repeat her restaging scans.  They were done on April 3rd.  The scans showed pulmonary nodules which were unchanged.  There is no disease otherwise.  She had changes consistent with her hepatectomy. She had a stable left adrenal nodule consistent with an adrenal adenoma. There was no adenopathy in the abdomen or pelvis.  Again, overall she has done quite well with chemotherapy.  She has had no pain.  She has had a little bit of a cough.  She has had no fever.  She has had no headache.  There has been no leg swelling.  PHYSICAL EXAMINATION:  This is a well-developed, well-nourished [place] American female in no obvious distress.  Vital signs:  98.6, pulse 76, respiratory rate 18, blood pressure 112/76.  Weight is 160.  Head and exam shows a normocephalic, atraumatic skull.  There is no scleral icterus.  There are no oral lesions.  There is no adenopathy in her neck.  Lungs:  Clear bilaterally.  Cardiac:  Regular rate and rhythm with a normal S1 and S2.  There are no murmurs, rubs or bruits. Abdomen:  Soft with good bowel sounds.  There is no palpable abdominal mass.  There is no fluid wave.  She has well-healed scars from her partial hepatectomy.  There is no palpable hepatosplenomegaly. Extremities show no clubbing, cyanosis or edema.  Neurological exam shows no focal neurological deficits.  Rectal exam shows brown stool that is heme-negative.  LABORATORY STUDIES:  White cell count is 3.3, hemoglobin 8.1, hematocrit 24.2, platelet count 81,000.  MCV is 99.  IMPRESSION:  [name] is a 61 year old African American  female with metastatic cholangiocarcinoma.  She has done well quite well with FOLFOX.  So far, she has had really no complications from the FOLFOX.  I am a little bit surprised about the anemia.  She really is asymptomatic with this.  However, I think we need to address this.  I want to try her on Aranesp.  She has not had Aranesp before.  I do not really see a downside to Aranesp.  Her own blood pressure has been okay.  I explained what Aranesp was to [name]  She understands this.  I explained some of the side effects to [name]  Again, she understands the side affects.  We are checking her iron stores are.  It is possible that she may need iron.  We last gave her iron back in January.  Again, I am glad that [name]' quality of life is excellent.  Her performance status is fantastic.  I think we can go ahead with treatment today.  Her monocytes up in the blood, so this is typically is indicative of marrow recovery.  We will plan to get her back to see [place] in another couple of weeks for followup.    ______________________________ [name], M.D. PRE/MEDQ  D:  11/20/2011  T:  11/21/2011  Job:  [id number]

## 2011-11-22 ENCOUNTER — Ambulatory Visit (HOSPITAL_BASED_OUTPATIENT_CLINIC_OR_DEPARTMENT_OTHER): Payer: Medicare Other

## 2011-11-22 DIAGNOSIS — C221 Intrahepatic bile duct carcinoma: Secondary | ICD-10-CM

## 2011-11-22 DIAGNOSIS — Z452 Encounter for adjustment and management of vascular access device: Secondary | ICD-10-CM

## 2011-11-22 MED ORDER — ALTEPLASE 2 MG IJ SOLR
2.0000 mg | Freq: Once | INTRAMUSCULAR | Status: DC | PRN
  Filled 2011-11-22: qty 2

## 2011-11-22 MED ORDER — COLD PACK MISC ONCOLOGY
1.0000 | Freq: Once | Status: DC | PRN
  Filled 2011-11-22: qty 1

## 2011-11-22 MED ORDER — HEPARIN SOD (PORK) LOCK FLUSH 100 UNIT/ML IV SOLN
500.0000 [IU] | Freq: Once | INTRAVENOUS | Status: DC | PRN
  Filled 2011-11-22: qty 5

## 2011-11-22 MED ORDER — SODIUM CHLORIDE 0.9 % IJ SOLN
3.0000 mL | INTRAMUSCULAR | Status: DC | PRN
  Filled 2011-11-22: qty 10

## 2011-11-22 MED ORDER — HEPARIN SOD (PORK) LOCK FLUSH 100 UNIT/ML IV SOLN
250.0000 [IU] | Freq: Once | INTRAVENOUS | Status: DC | PRN
  Filled 2011-11-22: qty 5

## 2011-11-22 MED ORDER — SODIUM CHLORIDE 0.9 % IJ SOLN
10.0000 mL | INTRAMUSCULAR | Status: DC | PRN
  Filled 2011-11-22: qty 10

## 2011-11-22 NOTE — Progress Notes (Signed)
[name] presented for Portacath access and flush. Proper placement of portacath confirmed by CXR. Portacath located in the right chest wall accessed with  H 20 needle. Clean, Dry and Intact Good blood return present. Portacath flushed with 20ml NS and 500U/23ml Heparin per protocol and needle removed intact. Procedure without incident. Patient tolerated procedure well.

## 2011-11-25 ENCOUNTER — Encounter: Payer: Self-pay | Admitting: *Deleted

## 2011-11-25 NOTE — Letter (Signed)
November 24, 2011    [employer] rights manager, attention:  Clinical cytogeneticist request. [address] [place], [place] [address].  NAME:  [name], [name] MRN:  [id number] DOB:  1951/02/29  Dear Sir/Madam:  [name] is a patient at the [facility] in [facility] [place].  She received a jury summons to appear for jury duty at the [employer] courthouse on Tuesday April 30.  Her juror number is [id number].  Ms. Pauli has metastatic cholangiocarcinoma.  This is bile duct cancer. She is undergoing aggressive chemotherapy right now.  [name] is medically unable to participate in jury duty.  Her chemotherapy has made her too weak.  She has a compromised immune system.  She is at risk for infection.  She is at risk for bleeding.  I do realize that [name] would certainly appreciate the opportunity to participate in the judicial process but again, she is medically unable to.  As such, I would be most appreciative if the court can excuse her from jury duty.  I believe that she should be medically unable to participate in jury duty for another year.  Again, [name] is currently undergoing aggressive chemotherapy for her metastatic cancer.  As such, she is medically unable to participate in jury duty.  If there is any further medical information that is needed by our office, please feel free to let me know at [phone].  Respectfully,    Josph Macho, M.D.  PRE/MEDQ  D:  11/24/2011  T:  11/24/2011  Job:  [id number]

## 2011-11-27 ENCOUNTER — Encounter: Payer: Self-pay | Admitting: *Deleted

## 2011-12-04 ENCOUNTER — Ambulatory Visit (HOSPITAL_BASED_OUTPATIENT_CLINIC_OR_DEPARTMENT_OTHER): Payer: Medicare Other | Admitting: Hematology & Oncology

## 2011-12-04 ENCOUNTER — Ambulatory Visit: Payer: Medicare Other

## 2011-12-04 ENCOUNTER — Other Ambulatory Visit (HOSPITAL_BASED_OUTPATIENT_CLINIC_OR_DEPARTMENT_OTHER): Payer: Medicare Other | Admitting: Lab

## 2011-12-04 DIAGNOSIS — C221 Intrahepatic bile duct carcinoma: Secondary | ICD-10-CM

## 2011-12-04 DIAGNOSIS — T451X5A Adverse effect of antineoplastic and immunosuppressive drugs, initial encounter: Secondary | ICD-10-CM

## 2011-12-04 DIAGNOSIS — C7800 Secondary malignant neoplasm of unspecified lung: Secondary | ICD-10-CM

## 2011-12-04 LAB — CBC WITH DIFFERENTIAL (CANCER CENTER ONLY)
BASO#: 0 10*3/uL (ref 0.0–0.2)
EOS%: 1.7 % (ref 0.0–7.0)
Eosinophils Absolute: 0 10*3/uL (ref 0.0–0.5)
HCT: 24.8 % — ABNORMAL LOW (ref 34.8–46.6)
HGB: 8.3 g/dL — ABNORMAL LOW (ref 11.6–15.9)
LYMPH#: 0.3 10*3/uL — ABNORMAL LOW (ref 0.9–3.3)
MCHC: 33.5 g/dL (ref 32.0–36.0)
MONO#: 0.6 10*3/uL (ref 0.1–0.9)
NEUT#: 1.3 10*3/uL — ABNORMAL LOW (ref 1.5–6.5)
NEUT%: 57.4 % (ref 39.6–80.0)
RBC: 2.42 10*6/uL — ABNORMAL LOW (ref 3.70–5.32)

## 2011-12-04 NOTE — Progress Notes (Signed)
Patient did not receive chemotherapy per dr. Myna Hidalgo today due to myelosuppression.

## 2011-12-04 NOTE — Progress Notes (Signed)
This office note has been dictated.

## 2011-12-05 NOTE — Progress Notes (Signed)
DIAGNOSIS:  Metastatic cholangiocarcinoma.  CURRENT THERAPY:  The patient is status post 5 cycles of FOLFOX.  INTERVAL HISTORY:  [name] comes in followup.  She is feeling fairly well.  She had a birthday since we last saw her.  She had a nice birthday.  She and her husband also had their anniversary.  She really looks great.  Her blood counts have suffered a little bit with treatment.  She has had no problems with bleeding.  She did have an active area of ulceration in her stomach that hopefully has healed up now.  She has had no pain. There is no nausea or vomiting.  There has been no diarrhea.  She has had no leg swelling.  She has had no rashes.  She has had no headache.  Overall, her performance status is ECOG 1.  PHYSICAL EXAMINATION:  This is a well-developed, well-nourished black female in no obvious distress.  Vital signs:  Temperature of 98.6, pulse 66, respiratory rate 20, blood pressure 132/85.  Weight is 158.  Head and neck exam shows a normocephalic, atraumatic skull.  There are no ocular or oral lesions.  There are no palpable cervical or supraclavicular lymph nodes.  Lungs:  Clear to percussion and auscultation bilaterally.  Cardiac:  Regular rate and rhythm with a normal S1, S2.  There are no murmurs, rubs or bruits.  Abdomen:  Soft with good bowel sounds.  There is no palpable abdominal mass.  There is no fluid wave.  There is no palpable hepatosplenomegaly.  She has a well- healed laparotomy scar from the partial hepatectomy.  Extremities:  No clubbing, cyanosis or edema.  Neurologic:  No focal neurological deficits.  LABORATORY:  White cell count is 2.3, hemoglobin 8.3, hematocrit 24.8, platelet count 53.  IMPRESSION:  [name] is a 61 year old African female with metastatic cholangiocarcinoma.  It has been about a year since she underwent her initial surgery.  She had locally advanced disease.  We gave her adjuvant chemo and radiation therapy.   Unfortunately, she then recurred with pulmonary metastases.  I think that her blood counts just not good enough for treatment right now.  I think that a week off will help her out.  Will move her appointment back 1 week.  I will plan to see her back myself in about 3 weeks' time.  Will probably do 4 cycles of treatment and then rescan her.  I think that if her next CT scan shows stability of disease, we might be able to give her a "vacation" from chemotherapy and just follow her.    ______________________________ Josph Macho, M.D. PRE/MEDQ  D:  12/04/2011  T:  12/05/2011  Job:  [id number]

## 2011-12-11 ENCOUNTER — Ambulatory Visit (HOSPITAL_BASED_OUTPATIENT_CLINIC_OR_DEPARTMENT_OTHER): Payer: Medicare Other

## 2011-12-11 ENCOUNTER — Other Ambulatory Visit (HOSPITAL_BASED_OUTPATIENT_CLINIC_OR_DEPARTMENT_OTHER): Payer: Medicare Other | Admitting: Lab

## 2011-12-11 DIAGNOSIS — K922 Gastrointestinal hemorrhage, unspecified: Secondary | ICD-10-CM

## 2011-12-11 DIAGNOSIS — D6481 Anemia due to antineoplastic chemotherapy: Secondary | ICD-10-CM

## 2011-12-11 DIAGNOSIS — C221 Intrahepatic bile duct carcinoma: Secondary | ICD-10-CM

## 2011-12-11 DIAGNOSIS — C7800 Secondary malignant neoplasm of unspecified lung: Secondary | ICD-10-CM

## 2011-12-11 DIAGNOSIS — Z5111 Encounter for antineoplastic chemotherapy: Secondary | ICD-10-CM

## 2011-12-11 DIAGNOSIS — D509 Iron deficiency anemia, unspecified: Secondary | ICD-10-CM

## 2011-12-11 LAB — CBC WITH DIFFERENTIAL (CANCER CENTER ONLY)
BASO%: 0.3 % (ref 0.0–2.0)
HCT: 26.9 % — ABNORMAL LOW (ref 34.8–46.6)
LYMPH%: 14.9 % (ref 14.0–48.0)
MCH: 34.6 pg — ABNORMAL HIGH (ref 26.0–34.0)
MCV: 104 fL — ABNORMAL HIGH (ref 81–101)
MONO#: 1.1 10*3/uL — ABNORMAL HIGH (ref 0.1–0.9)
MONO%: 39.2 % — ABNORMAL HIGH (ref 0.0–13.0)
NEUT%: 44.2 % (ref 39.6–80.0)
Platelets: 102 10*3/uL — ABNORMAL LOW (ref 145–400)
RDW: 15.2 % (ref 11.1–15.7)
WBC: 2.9 10*3/uL — ABNORMAL LOW (ref 3.9–10.0)

## 2011-12-11 LAB — COMPREHENSIVE METABOLIC PANEL
AST: 33 U/L (ref 0–37)
Albumin: 3.2 g/dL — ABNORMAL LOW (ref 3.5–5.2)
BUN: 15 mg/dL (ref 6–23)
CO2: 27 mEq/L (ref 19–32)
Calcium: 8.8 mg/dL (ref 8.4–10.5)
Chloride: 110 mEq/L (ref 96–112)
Creatinine, Ser: 0.88 mg/dL (ref 0.50–1.10)
Glucose, Bld: 90 mg/dL (ref 70–99)
Potassium: 4 mEq/L (ref 3.5–5.3)

## 2011-12-11 LAB — CANCER ANTIGEN 19-9: CA 19-9: 189.5 U/mL — ABNORMAL HIGH (ref <35.0)

## 2011-12-11 LAB — FERRITIN: Ferritin: 2857 ng/mL — ABNORMAL HIGH (ref 10–291)

## 2011-12-11 LAB — IRON AND TIBC
Iron: 102 ug/dL (ref 42–145)
UIBC: 129 ug/dL (ref 125–400)

## 2011-12-11 MED ORDER — FLUOROURACIL CHEMO INJECTION 2.5 GM/50ML
400.0000 mg/m2 | Freq: Once | INTRAVENOUS | Status: AC
  Administered 2011-12-11: 700 mg via INTRAVENOUS
  Filled 2011-12-11: qty 14

## 2011-12-11 MED ORDER — OXALIPLATIN CHEMO INJECTION 100 MG/20ML
76.5000 mg/m2 | Freq: Once | INTRAVENOUS | Status: AC
  Administered 2011-12-11: 135 mg via INTRAVENOUS
  Filled 2011-12-11: qty 27

## 2011-12-11 MED ORDER — SODIUM CHLORIDE 0.9 % IV SOLN
2400.0000 mg/m2 | INTRAVENOUS | Status: DC
  Administered 2011-12-11: 4300 mg via INTRAVENOUS
  Filled 2011-12-11: qty 86

## 2011-12-11 MED ORDER — HEPARIN SOD (PORK) LOCK FLUSH 100 UNIT/ML IV SOLN
500.0000 [IU] | Freq: Once | INTRAVENOUS | Status: AC | PRN
  Filled 2011-12-11: qty 5

## 2011-12-11 MED ORDER — SODIUM CHLORIDE 0.9 % IJ SOLN
10.0000 mL | INTRAMUSCULAR | Status: DC | PRN
  Filled 2011-12-11: qty 10

## 2011-12-11 MED ORDER — DEXTROSE 5 % IV SOLN
Freq: Once | INTRAVENOUS | Status: AC
  Administered 2011-12-11: 11:00:00 via INTRAVENOUS

## 2011-12-11 MED ORDER — ONDANSETRON 8 MG/50ML IVPB (CHCC)
8.0000 mg | Freq: Once | INTRAVENOUS | Status: AC
  Administered 2011-12-11: 8 mg via INTRAVENOUS

## 2011-12-11 MED ORDER — DEXAMETHASONE SODIUM PHOSPHATE 10 MG/ML IJ SOLN
10.0000 mg | Freq: Once | INTRAMUSCULAR | Status: AC
  Administered 2011-12-11: 10 mg via INTRAVENOUS

## 2011-12-11 MED ORDER — LEUCOVORIN CALCIUM INJECTION 350 MG
400.0000 mg/m2 | Freq: Once | INTRAVENOUS | Status: AC
  Administered 2011-12-11: 716 mg via INTRAVENOUS
  Filled 2011-12-11: qty 35.8

## 2011-12-11 NOTE — Patient Instructions (Signed)
Leucovorin injection What is this medicine? LEUCOVORIN (loo koe VOR in) is used to prevent or treat the harmful effects of some medicines. This medicine is used to treat anemia caused by a low amount of folic acid in the body. It is also used with 5-fluorouracil (5-FU) to treat colon cancer. This medicine may be used for other purposes; ask your health care provider or pharmacist if you have questions. What should I tell my health care provider before I take this medicine? They need to know if you have any of these conditions: -anemia from low levels of vitamin B-12 in the blood -an unusual or allergic reaction to leucovorin, folic acid, other medicines, foods, dyes, or preservatives -pregnant or trying to get pregnant -breast-feeding How should I use this medicine? This medicine is for injection into a muscle or into a vein. It is given by a health care professional in a hospital or clinic setting. Talk to your pediatrician regarding the use of this medicine in children. Special care may be needed. Overdosage: If you think you have taken too much of this medicine contact a poison control center or emergency room at once. NOTE: This medicine is only for you. Do not share this medicine with others. What if I miss a dose? This does not apply. What may interact with this medicine? -capecitabine -fluorouracil -phenobarbital -phenytoin -primidone -trimethoprim-sulfamethoxazole This list may not describe all possible interactions. Give your health care provider a list of all the medicines, herbs, non-prescription drugs, or dietary supplements you use. Also tell them if you smoke, drink alcohol, or use illegal drugs. Some items may interact with your medicine. What should I watch for while using this medicine? Your condition will be monitored carefully while you are receiving this medicine. This medicine may increase the side effects of 5-fluorouracil, 5-FU. Tell your doctor or health care  professional if you have diarrhea or mouth sores that do not get better or that get worse. What side effects may I notice from receiving this medicine? Side effects that you should report to your doctor or health care professional as soon as possible: -allergic reactions like skin rash, itching or hives, swelling of the face, lips, or tongue -breathing problems -fever, infection -mouth sores -unusual bleeding or bruising -unusually weak or tired Side effects that usually do not require medical attention (report to your doctor or health care professional if they continue or are bothersome): -constipation or diarrhea -loss of appetite -nausea, vomiting This list may not describe all possible side effects. Call your doctor for medical advice about side effects. You may report side effects to FDA at 1-800-FDA-1088. Where should I keep my medicine? This drug is given in a hospital or clinic and will not be stored at home. NOTE: This sheet is a summary. It may not cover all possible information. If you have questions about this medicine, talk to your doctor, pharmacist, or health care provider.  2012, Elsevier/[employer] Standard. (02/02/2008 4:50:29 PM)     Oxaliplatin Injection What is this medicine? OXALIPLATIN (ox AL i PLA tin) is a chemotherapy drug. It targets fast dividing cells, like cancer cells, and causes these cells to die. This medicine is used to treat cancers of the colon and rectum, and many other cancers. This medicine may be used for other purposes; ask your health care provider or pharmacist if you have questions. What should I tell my health care provider before I take this medicine? They need to know if you have any of these conditions: -  kidney disease -an unusual or allergic reaction to oxaliplatin, other chemotherapy, other medicines, foods, dyes, or preservatives -pregnant or trying to get pregnant -breast-feeding How should I use this medicine? This drug is given as an  infusion into a vein. It is administered in a hospital or clinic by a specially trained health care professional. Talk to your pediatrician regarding the use of this medicine in children. Special care may be needed. Overdosage: If you think you have taken too much of this medicine contact a poison control center or emergency room at once. NOTE: This medicine is only for you. Do not share this medicine with others. What if I miss a dose? It is important not to miss a dose. Call your doctor or health care professional if you are unable to keep an appointment. What may interact with this medicine? -medicines to increase blood counts like filgrastim, pegfilgrastim, sargramostim -probenecid -some antibiotics like amikacin, gentamicin, neomycin, polymyxin B, streptomycin, tobramycin -zalcitabine Talk to your doctor or health care professional before taking any of these medicines: -acetaminophen -aspirin -ibuprofen -ketoprofen -naproxen This list may not describe all possible interactions. Give your health care provider a list of all the medicines, herbs, non-prescription drugs, or dietary supplements you use. Also tell them if you smoke, drink alcohol, or use illegal drugs. Some items may interact with your medicine. What should I watch for while using this medicine? Your condition will be monitored carefully while you are receiving this medicine. You will need important blood work done while you are taking this medicine. This medicine can make you more sensitive to cold. Do not drink cold drinks or use ice. Cover exposed skin before coming in contact with cold temperatures or cold objects. When out in cold weather wear warm clothing and cover your mouth and nose to warm the air that goes into your lungs. Tell your doctor if you get sensitive to the cold. This drug may make you feel generally unwell. This is not uncommon, as chemotherapy can affect healthy cells as well as cancer cells. Report any  side effects. Continue your course of treatment even though you feel ill unless your doctor tells you to stop. In some cases, you may be given additional medicines to help with side effects. Follow all directions for their use. Call your doctor or health care professional for advice if you get a fever, chills or sore throat, or other symptoms of a cold or flu. Do not treat yourself. This drug decreases your body's ability to fight infections. Try to avoid being around people who are sick. This medicine may increase your risk to bruise or bleed. Call your doctor or health care professional if you notice any unusual bleeding. Be careful brushing and flossing your teeth or using a toothpick because you may get an infection or bleed more easily. If you have any dental work done, tell your dentist you are receiving this medicine. Avoid taking products that contain aspirin, acetaminophen, ibuprofen, naproxen, or ketoprofen unless instructed by your doctor. These medicines may hide a fever. Do not become pregnant while taking this medicine. Women should inform their doctor if they wish to become pregnant or think they might be pregnant. There is a potential for serious side effects to an unborn child. Talk to your health care professional or pharmacist for more information. Do not breast-feed an infant while taking this medicine. Call your doctor or health care professional if you get diarrhea. Do not treat yourself. What side effects may I notice from  receiving this medicine? Side effects that you should report to your doctor or health care professional as soon as possible: -allergic reactions like skin rash, itching or hives, swelling of the face, lips, or tongue -low blood counts - This drug may decrease the number of white blood cells, red blood cells and platelets. You may be at increased risk for infections and bleeding. -signs of infection - fever or chills, cough, sore throat, pain or difficulty passing  urine -signs of decreased platelets or bleeding - bruising, pinpoint red spots on the skin, black, tarry stools, nosebleeds -signs of decreased red blood cells - unusually weak or tired, fainting spells, lightheadedness -breathing problems -chest pain, pressure -cough -diarrhea -jaw tightness -mouth sores -nausea and vomiting -pain, swelling, redness or irritation at the injection site -pain, tingling, numbness in the hands or feet -problems with balance, talking, walking -redness, blistering, peeling or loosening of the skin, including inside the mouth -trouble passing urine or change in the amount of urine Side effects that usually do not require medical attention (report to your doctor or health care professional if they continue or are bothersome): -changes in vision -constipation -hair loss -loss of appetite -metallic taste in the mouth or changes in taste -stomach pain This list may not describe all possible side effects. Call your doctor for medical advice about side effects. You may report side effects to FDA at 1-800-FDA-1088. Where should I keep my medicine? This drug is given in a hospital or clinic and will not be stored at home. NOTE: This sheet is a summary. It may not cover all possible information. If you have questions about this medicine, talk to your doctor, pharmacist, or health care provider.  2012, Elsevier/Gold Standard. (02/23/2008 5:22:47 PM)

## 2011-12-13 ENCOUNTER — Ambulatory Visit (HOSPITAL_BASED_OUTPATIENT_CLINIC_OR_DEPARTMENT_OTHER): Payer: Medicare Other

## 2011-12-13 DIAGNOSIS — C221 Intrahepatic bile duct carcinoma: Secondary | ICD-10-CM

## 2011-12-13 MED ORDER — HEPARIN SOD (PORK) LOCK FLUSH 100 UNIT/ML IV SOLN
500.0000 [IU] | Freq: Once | INTRAVENOUS | Status: AC | PRN
  Administered 2011-12-13: 500 [IU]
  Filled 2011-12-13: qty 5

## 2011-12-13 MED ORDER — SODIUM CHLORIDE 0.9 % IJ SOLN
10.0000 mL | INTRAMUSCULAR | Status: DC | PRN
  Administered 2011-12-13: 10 mL
  Filled 2011-12-13: qty 10

## 2011-12-18 ENCOUNTER — Ambulatory Visit: Payer: Medicare Other | Admitting: Hematology & Oncology

## 2011-12-18 ENCOUNTER — Other Ambulatory Visit: Payer: Medicare Other | Admitting: Lab

## 2011-12-18 ENCOUNTER — Ambulatory Visit: Payer: Medicare Other

## 2011-12-25 ENCOUNTER — Ambulatory Visit (HOSPITAL_BASED_OUTPATIENT_CLINIC_OR_DEPARTMENT_OTHER): Payer: Medicare Other | Admitting: Lab

## 2011-12-25 ENCOUNTER — Ambulatory Visit (HOSPITAL_BASED_OUTPATIENT_CLINIC_OR_DEPARTMENT_OTHER): Payer: Medicare Other | Admitting: Hematology & Oncology

## 2011-12-25 ENCOUNTER — Ambulatory Visit: Payer: Medicare Other

## 2011-12-25 DIAGNOSIS — K922 Gastrointestinal hemorrhage, unspecified: Secondary | ICD-10-CM

## 2011-12-25 DIAGNOSIS — C221 Intrahepatic bile duct carcinoma: Secondary | ICD-10-CM

## 2011-12-25 DIAGNOSIS — C7800 Secondary malignant neoplasm of unspecified lung: Secondary | ICD-10-CM

## 2011-12-25 DIAGNOSIS — D509 Iron deficiency anemia, unspecified: Secondary | ICD-10-CM

## 2011-12-25 DIAGNOSIS — T451X5A Adverse effect of antineoplastic and immunosuppressive drugs, initial encounter: Secondary | ICD-10-CM

## 2011-12-25 DIAGNOSIS — D709 Neutropenia, unspecified: Secondary | ICD-10-CM

## 2011-12-25 DIAGNOSIS — D649 Anemia, unspecified: Secondary | ICD-10-CM

## 2011-12-25 LAB — CBC WITH DIFFERENTIAL (CANCER CENTER ONLY)
BASO#: 0 10*3/uL (ref 0.0–0.2)
Eosinophils Absolute: 0 10*3/uL (ref 0.0–0.5)
HGB: 8.6 g/dL — ABNORMAL LOW (ref 11.6–15.9)
LYMPH#: 0.3 10*3/uL — ABNORMAL LOW (ref 0.9–3.3)
MCH: 34.5 pg — ABNORMAL HIGH (ref 26.0–34.0)
MONO#: 0.7 10*3/uL (ref 0.1–0.9)
MONO%: 22.7 % — ABNORMAL HIGH (ref 0.0–13.0)
NEUT#: 2.1 10*3/uL (ref 1.5–6.5)
Platelets: 59 10*3/uL — ABNORMAL LOW (ref 145–400)
RBC: 2.49 10*6/uL — ABNORMAL LOW (ref 3.70–5.32)
WBC: 3.2 10*3/uL — ABNORMAL LOW (ref 3.9–10.0)

## 2011-12-25 NOTE — Progress Notes (Signed)
This office note has been dictated.

## 2011-12-26 NOTE — Progress Notes (Signed)
CC:   [name] Sherry Goodell, MD  DIAGNOSIS:  Metastatic cholangiocarcinoma.  CURRENT THERAPY:  The patient is status post 6 cycles of FOLFOX.  INTERIM HISTORY:  [name] comes in for her followup.  She feels good. She has not noticed any melena or bright red blood per rectum.  There has been no abdominal pain.  She has been eating okay.  She had a nice Mother's Day.  There has been no cough.  There has been no leg swelling.  She has had no fevers, sweats or chills.  PHYSICAL EXAMINATION:  This is a well-developed, well-nourished black female in no obvious distress.  Vital signs:  Temperature of 98.1, pulse 63, respiratory rate 20, blood pressure 123/88.  Weight is 158.  Head and neck:  A normocephalic, atraumatic skull.  There are no ocular or oral lesions.  There are no palpable cervical or supraclavicular lymph nodes.  Lungs:  Clear bilaterally.  Cardiac:  Regular rate and rhythm with a normal S1 and S2.  There are no murmurs, rubs or bruits. Abdomen:  Soft with good bowel sounds.  There is no palpable abdominal mass.  She has a well-healed laparotomy scar in the right upper quadrant.  There is no fluid wave.  There is no palpable hepatosplenomegaly.  Extremities:  No clubbing, cyanosis or edema. Neurologic:  No focal neurological deficits.  Skin:  No rashes, ecchymoses or petechia.  LABORATORY STUDIES:  White cell count is 3.2, hemoglobin 8.6, hematocrit 25.9, platelet count is 59,000.  IMPRESSION:  [name] is a 60 year old African American female with metastatic cholangiocarcinoma.  She is on FOLFOX.  She has done well with this to date.  However, her blood counts are a low on the low side for [place] to treat her.  I will delay her a week.  I will have her come back to see me on June 5th, at which point that will be her 8th cycle treatment and then we will we re-scan her.    ______________________________ Josph Macho, M.D. PRE/MEDQ  D:  12/25/2011  T:  12/26/2011  Job:   [id number]

## 2012-01-01 ENCOUNTER — Other Ambulatory Visit: Payer: Medicare Other | Admitting: Lab

## 2012-01-01 ENCOUNTER — Ambulatory Visit: Payer: Medicare Other

## 2012-01-01 ENCOUNTER — Ambulatory Visit: Payer: Medicare Other | Admitting: Hematology & Oncology

## 2012-01-02 ENCOUNTER — Ambulatory Visit (HOSPITAL_BASED_OUTPATIENT_CLINIC_OR_DEPARTMENT_OTHER): Payer: Medicare Other

## 2012-01-02 ENCOUNTER — Other Ambulatory Visit (HOSPITAL_BASED_OUTPATIENT_CLINIC_OR_DEPARTMENT_OTHER): Payer: Medicare Other | Admitting: Lab

## 2012-01-02 DIAGNOSIS — Z5111 Encounter for antineoplastic chemotherapy: Secondary | ICD-10-CM

## 2012-01-02 DIAGNOSIS — D649 Anemia, unspecified: Secondary | ICD-10-CM

## 2012-01-02 DIAGNOSIS — D509 Iron deficiency anemia, unspecified: Secondary | ICD-10-CM

## 2012-01-02 DIAGNOSIS — K922 Gastrointestinal hemorrhage, unspecified: Secondary | ICD-10-CM

## 2012-01-02 DIAGNOSIS — C221 Intrahepatic bile duct carcinoma: Secondary | ICD-10-CM

## 2012-01-02 DIAGNOSIS — D6481 Anemia due to antineoplastic chemotherapy: Secondary | ICD-10-CM

## 2012-01-02 LAB — CBC WITH DIFFERENTIAL (CANCER CENTER ONLY)
BASO#: 0 10*3/uL (ref 0.0–0.2)
Eosinophils Absolute: 0.1 10*3/uL (ref 0.0–0.5)
HGB: 8.5 g/dL — ABNORMAL LOW (ref 11.6–15.9)
LYMPH#: 0.6 10*3/uL — ABNORMAL LOW (ref 0.9–3.3)
MCH: 33.9 pg (ref 26.0–34.0)
MONO%: 26.6 % — ABNORMAL HIGH (ref 0.0–13.0)
NEUT#: 1.5 10*3/uL (ref 1.5–6.5)
Platelets: 79 10*3/uL — ABNORMAL LOW (ref 145–400)
RBC: 2.51 10*6/uL — ABNORMAL LOW (ref 3.70–5.32)
WBC: 2.9 10*3/uL — ABNORMAL LOW (ref 3.9–10.0)

## 2012-01-02 MED ORDER — OXALIPLATIN CHEMO INJECTION 100 MG/20ML
76.5000 mg/m2 | Freq: Once | INTRAVENOUS | Status: AC
  Administered 2012-01-02: 135 mg via INTRAVENOUS
  Filled 2012-01-02: qty 27

## 2012-01-02 MED ORDER — SODIUM CHLORIDE 0.9 % IV SOLN
2400.0000 mg/m2 | INTRAVENOUS | Status: DC
  Administered 2012-01-02: 4300 mg via INTRAVENOUS
  Filled 2012-01-02: qty 86

## 2012-01-02 MED ORDER — SODIUM CHLORIDE 0.9 % IJ SOLN
10.0000 mL | INTRAMUSCULAR | Status: DC | PRN
  Filled 2012-01-02: qty 10

## 2012-01-02 MED ORDER — DEXAMETHASONE SODIUM PHOSPHATE 10 MG/ML IJ SOLN
10.0000 mg | Freq: Once | INTRAMUSCULAR | Status: AC
  Administered 2012-01-02: 10 mg via INTRAVENOUS

## 2012-01-02 MED ORDER — ONDANSETRON 8 MG/50ML IVPB (CHCC)
8.0000 mg | Freq: Once | INTRAVENOUS | Status: AC
  Administered 2012-01-02: 8 mg via INTRAVENOUS

## 2012-01-02 MED ORDER — DEXTROSE 5 % IV SOLN
Freq: Once | INTRAVENOUS | Status: AC
  Administered 2012-01-02: 10:00:00 via INTRAVENOUS

## 2012-01-02 MED ORDER — DEXTROSE 5 % IV SOLN
400.0000 mg/m2 | Freq: Once | INTRAVENOUS | Status: AC
  Administered 2012-01-02: 716 mg via INTRAVENOUS
  Filled 2012-01-02: qty 35.8

## 2012-01-02 MED ORDER — FLUOROURACIL CHEMO INJECTION 2.5 GM/50ML
400.0000 mg/m2 | Freq: Once | INTRAVENOUS | Status: AC
  Administered 2012-01-02: 700 mg via INTRAVENOUS
  Filled 2012-01-02: qty 14

## 2012-01-02 MED ORDER — HEPARIN SOD (PORK) LOCK FLUSH 100 UNIT/ML IV SOLN
500.0000 [IU] | Freq: Once | INTRAVENOUS | Status: DC | PRN
  Filled 2012-01-02: qty 5

## 2012-01-02 NOTE — Progress Notes (Signed)
Dr. Myna Hidalgo notified of lab work, OK to treat. Teola Bradley, Mashayla Lavin [employer]

## 2012-01-02 NOTE — Patient Instructions (Signed)
Fluorouracil, 5-FU injection What is this medicine? FLUOROURACIL, 5-FU (flure oh YOOR a sil) is a chemotherapy drug. It slows the growth of cancer cells. This medicine is used to treat many types of cancer like breast cancer, colon or rectal cancer, pancreatic cancer, and stomach cancer. This medicine may be used for other purposes; ask your health care provider or pharmacist if you have questions. What should I tell my health care provider before I take this medicine? They need to know if you have any of these conditions: -blood disorders -dihydropyrimidine dehydrogenase (DPD) deficiency -infection (especially a virus infection such as chickenpox, cold sores, or herpes) -kidney disease -liver disease -malnourished, poor nutrition -recent or ongoing radiation therapy -an unusual or allergic reaction to fluorouracil, other chemotherapy, other medicines, foods, dyes, or preservatives -pregnant or trying to get pregnant -breast-feeding How should I use this medicine? This drug is given as an infusion or injection into a vein. It is administered in a hospital or clinic by a specially trained health care professional. Talk to your pediatrician regarding the use of this medicine in children. Special care may be needed. Overdosage: If you think you have taken too much of this medicine contact a poison control center or emergency room at once. NOTE: This medicine is only for you. Do not share this medicine with others. What if I miss a dose? It is important not to miss your dose. Call your doctor or health care professional if you are unable to keep an appointment. What may interact with this medicine? -allopurinol -cimetidine -dapsone -digoxin -hydroxyurea -leucovorin -levamisole -medicines for seizures like ethotoin, fosphenytoin, phenytoin -medicines to increase blood counts like filgrastim, pegfilgrastim, sargramostim -medicines that treat or prevent blood clots like warfarin,  enoxaparin, and dalteparin -methotrexate -metronidazole -pyrimethamine -some other chemotherapy drugs like busulfan, cisplatin, estramustine, vinblastine -trimethoprim -trimetrexate -vaccines Talk to your doctor or health care professional before taking any of these medicines: -acetaminophen -aspirin -ibuprofen -ketoprofen -naproxen This list may not describe all possible interactions. Give your health care provider a list of all the medicines, herbs, non-prescription drugs, or dietary supplements you use. Also tell them if you smoke, drink alcohol, or use illegal drugs. Some items may interact with your medicine. What should I watch for while using this medicine? Visit your doctor for checks on your progress. This drug may make you feel generally unwell. This is not uncommon, as chemotherapy can affect healthy cells as well as cancer cells. Report any side effects. Continue your course of treatment even though you feel ill unless your doctor tells you to stop. In some cases, you may be given additional medicines to help with side effects. Follow all directions for their use. Call your doctor or health care professional for advice if you get a fever, chills or sore throat, or other symptoms of a cold or flu. Do not treat yourself. This drug decreases your body's ability to fight infections. Try to avoid being around people who are sick. This medicine may increase your risk to bruise or bleed. Call your doctor or health care professional if you notice any unusual bleeding. Be careful brushing and flossing your teeth or using a toothpick because you may get an infection or bleed more easily. If you have any dental work done, tell your dentist you are receiving this medicine. Avoid taking products that contain aspirin, acetaminophen, ibuprofen, naproxen, or ketoprofen unless instructed by your doctor. These medicines may hide a fever. Do not become pregnant while taking this medicine. Women should    inform their doctor if they wish to become pregnant or think they might be pregnant. There is a potential for serious side effects to an unborn child. Talk to your health care professional or pharmacist for more information. Do not breast-feed an infant while taking this medicine. Men should inform their doctor if they wish to father a child. This medicine may lower sperm counts. Do not treat diarrhea with over the counter products. Contact your doctor if you have diarrhea that lasts more than 2 days or if it is severe and watery. This medicine can make you more sensitive to the sun. Keep out of the sun. If you cannot avoid being in the sun, wear protective clothing and use sunscreen. Do not use sun lamps or tanning beds/booths. What side effects may I notice from receiving this medicine? Side effects that you should report to your doctor or health care professional as soon as possible: -allergic reactions like skin rash, itching or hives, swelling of the face, lips, or tongue -low blood counts - this medicine may decrease the number of white blood cells, red blood cells and platelets. You may be at increased risk for infections and bleeding. -signs of infection - fever or chills, cough, sore throat, pain or difficulty passing urine -signs of decreased platelets or bleeding - bruising, pinpoint red spots on the skin, black, tarry stools, blood in the urine -signs of decreased red blood cells - unusually weak or tired, fainting spells, lightheadedness -breathing problems -changes in vision -chest pain -mouth sores -nausea and vomiting -pain, swelling, redness at site where injected -pain, tingling, numbness in the hands or feet -redness, swelling, or sores on hands or feet -stomach pain -unusual bleeding Side effects that usually do not require medical attention (report to your doctor or health care professional if they continue or are bothersome): -changes in finger or toe  nails -diarrhea -dry or itchy skin -hair loss -headache -loss of appetite -sensitivity of eyes to the light -stomach upset -unusually teary eyes This list may not describe all possible side effects. Call your doctor for medical advice about side effects. You may report side effects to FDA at 1-800-FDA-1088. Where should I keep my medicine? This drug is given in a hospital or clinic and will not be stored at home. NOTE: This sheet is a summary. It may not cover all possible information. If you have questions about this medicine, talk to your doctor, pharmacist, or health care provider.  2012, Elsevier/Gold Standard. (12/02/2007 1:53:16 PM)Oxaliplatin Injection What is this medicine? OXALIPLATIN (ox AL i PLA tin) is a chemotherapy drug. It targets fast dividing cells, like cancer cells, and causes these cells to die. This medicine is used to treat cancers of the colon and rectum, and many other cancers. This medicine may be used for other purposes; ask your health care provider or pharmacist if you have questions. What should I tell my health care provider before I take this medicine? They need to know if you have any of these conditions: -kidney disease -an unusual or allergic reaction to oxaliplatin, other chemotherapy, other medicines, foods, dyes, or preservatives -pregnant or trying to get pregnant -breast-feeding How should I use this medicine? This drug is given as an infusion into a vein. It is administered in a hospital or clinic by a specially trained health care professional. Talk to your pediatrician regarding the use of this medicine in children. Special care may be needed. Overdosage: If you think you have taken too much of this medicine   contact a poison control center or emergency room at once. NOTE: This medicine is only for you. Do not share this medicine with others. What if I miss a dose? It is important not to miss a dose. Call your doctor or health care professional if  you are unable to keep an appointment. What may interact with this medicine? -medicines to increase blood counts like filgrastim, pegfilgrastim, sargramostim -probenecid -some antibiotics like amikacin, gentamicin, neomycin, polymyxin B, streptomycin, tobramycin -zalcitabine Talk to your doctor or health care professional before taking any of these medicines: -acetaminophen -aspirin -ibuprofen -ketoprofen -naproxen This list may not describe all possible interactions. Give your health care provider a list of all the medicines, herbs, non-prescription drugs, or dietary supplements you use. Also tell them if you smoke, drink alcohol, or use illegal drugs. Some items may interact with your medicine. What should I watch for while using this medicine? Your condition will be monitored carefully while you are receiving this medicine. You will need important blood work done while you are taking this medicine. This medicine can make you more sensitive to cold. Do not drink cold drinks or use ice. Cover exposed skin before coming in contact with cold temperatures or cold objects. When out in cold weather wear warm clothing and cover your mouth and nose to warm the air that goes into your lungs. Tell your doctor if you get sensitive to the cold. This drug may make you feel generally unwell. This is not uncommon, as chemotherapy can affect healthy cells as well as cancer cells. Report any side effects. Continue your course of treatment even though you feel ill unless your doctor tells you to stop. In some cases, you may be given additional medicines to help with side effects. Follow all directions for their use. Call your doctor or health care professional for advice if you get a fever, chills or sore throat, or other symptoms of a cold or flu. Do not treat yourself. This drug decreases your body's ability to fight infections. Try to avoid being around people who are sick. This medicine may increase your risk  to bruise or bleed. Call your doctor or health care professional if you notice any unusual bleeding. Be careful brushing and flossing your teeth or using a toothpick because you may get an infection or bleed more easily. If you have any dental work done, tell your dentist you are receiving this medicine. Avoid taking products that contain aspirin, acetaminophen, ibuprofen, naproxen, or ketoprofen unless instructed by your doctor. These medicines may hide a fever. Do not become pregnant while taking this medicine. Women should inform their doctor if they wish to become pregnant or think they might be pregnant. There is a potential for serious side effects to an unborn child. Talk to your health care professional or pharmacist for more information. Do not breast-feed an infant while taking this medicine. Call your doctor or health care professional if you get diarrhea. Do not treat yourself. What side effects may I notice from receiving this medicine? Side effects that you should report to your doctor or health care professional as soon as possible: -allergic reactions like skin rash, itching or hives, swelling of the face, lips, or tongue -low blood counts - This drug may decrease the number of white blood cells, red blood cells and platelets. You may be at increased risk for infections and bleeding. -signs of infection - fever or chills, cough, sore throat, pain or difficulty passing urine -signs of decreased platelets or   bleeding - bruising, pinpoint red spots on the skin, black, tarry stools, nosebleeds -signs of decreased red blood cells - unusually weak or tired, fainting spells, lightheadedness -breathing problems -chest pain, pressure -cough -diarrhea -jaw tightness -mouth sores -nausea and vomiting -pain, swelling, redness or irritation at the injection site -pain, tingling, numbness in the hands or feet -problems with balance, talking, walking -redness, blistering, peeling or loosening  of the skin, including inside the mouth -trouble passing urine or change in the amount of urine Side effects that usually do not require medical attention (report to your doctor or health care professional if they continue or are bothersome): -changes in vision -constipation -hair loss -loss of appetite -metallic taste in the mouth or changes in taste -stomach pain This list may not describe all possible side effects. Call your doctor for medical advice about side effects. You may report side effects to FDA at 1-800-FDA-1088. Where should I keep my medicine? This drug is given in a hospital or clinic and will not be stored at home. NOTE: This sheet is a summary. It may not cover all possible information. If you have questions about this medicine, talk to your doctor, pharmacist, or health care provider.  2012, [employer]vier/[employer] Standard. (02/23/2008 5:22:47 PM) 

## 2012-01-03 LAB — COMPREHENSIVE METABOLIC PANEL
Albumin: 3.2 g/dL — ABNORMAL LOW (ref 3.5–5.2)
BUN: 15 mg/dL (ref 6–23)
CO2: 25 mEq/L (ref 19–32)
Calcium: 8.7 mg/dL (ref 8.4–10.5)
Chloride: 109 mEq/L (ref 96–112)
Glucose, Bld: 85 mg/dL (ref 70–99)
Potassium: 3.8 mEq/L (ref 3.5–5.3)
Sodium: 142 mEq/L (ref 135–145)
Total Protein: 5.9 g/dL — ABNORMAL LOW (ref 6.0–8.3)

## 2012-01-03 LAB — PREALBUMIN: Prealbumin: 12 mg/dL — ABNORMAL LOW (ref 17.0–34.0)

## 2012-01-04 ENCOUNTER — Ambulatory Visit (HOSPITAL_BASED_OUTPATIENT_CLINIC_OR_DEPARTMENT_OTHER): Payer: Medicare Other

## 2012-01-04 DIAGNOSIS — Z452 Encounter for adjustment and management of vascular access device: Secondary | ICD-10-CM

## 2012-01-04 DIAGNOSIS — C221 Intrahepatic bile duct carcinoma: Secondary | ICD-10-CM

## 2012-01-04 MED ORDER — HEPARIN SOD (PORK) LOCK FLUSH 100 UNIT/ML IV SOLN
500.0000 [IU] | Freq: Once | INTRAVENOUS | Status: AC | PRN
  Administered 2012-01-04: 500 [IU]
  Filled 2012-01-04: qty 5

## 2012-01-04 MED ORDER — SODIUM CHLORIDE 0.9 % IJ SOLN
10.0000 mL | INTRAMUSCULAR | Status: DC | PRN
  Administered 2012-01-04: 10 mL
  Filled 2012-01-04: qty 10

## 2012-01-04 NOTE — Patient Instructions (Signed)
Pt in for pump d/c.  No complaints.  Pt instructed to call with questions and concerns.  Pt discharged home ambulatory with family.

## 2012-01-08 ENCOUNTER — Ambulatory Visit: Payer: Medicare Other

## 2012-01-08 ENCOUNTER — Other Ambulatory Visit: Payer: Medicare Other | Admitting: Lab

## 2012-01-08 ENCOUNTER — Ambulatory Visit: Payer: Medicare Other | Admitting: Hematology & Oncology

## 2012-01-14 NOTE — Progress Notes (Signed)
Mrs.Kempner, Norah en on 11/20/2011. For some reason. It did not take the scanning of the medication. Medication was double check by two nurses.

## 2012-01-15 ENCOUNTER — Other Ambulatory Visit (HOSPITAL_BASED_OUTPATIENT_CLINIC_OR_DEPARTMENT_OTHER): Payer: Medicare Other | Admitting: Lab

## 2012-01-15 ENCOUNTER — Ambulatory Visit (HOSPITAL_BASED_OUTPATIENT_CLINIC_OR_DEPARTMENT_OTHER): Payer: Medicare Other | Admitting: Hematology & Oncology

## 2012-01-15 ENCOUNTER — Ambulatory Visit: Payer: Medicare Other

## 2012-01-15 VITALS — BP 130/83 | HR 55 | Temp 98.0°F | Ht 63.0 in | Wt 160.0 lb

## 2012-01-15 DIAGNOSIS — E559 Vitamin D deficiency, unspecified: Secondary | ICD-10-CM

## 2012-01-15 DIAGNOSIS — D509 Iron deficiency anemia, unspecified: Secondary | ICD-10-CM

## 2012-01-15 DIAGNOSIS — C221 Intrahepatic bile duct carcinoma: Secondary | ICD-10-CM

## 2012-01-15 DIAGNOSIS — C7800 Secondary malignant neoplasm of unspecified lung: Secondary | ICD-10-CM

## 2012-01-15 DIAGNOSIS — K922 Gastrointestinal hemorrhage, unspecified: Secondary | ICD-10-CM

## 2012-01-15 LAB — CBC WITH DIFFERENTIAL (CANCER CENTER ONLY)
BASO#: 0 10*3/uL (ref 0.0–0.2)
EOS%: 1.8 % (ref 0.0–7.0)
Eosinophils Absolute: 0.1 10*3/uL (ref 0.0–0.5)
HGB: 8.2 g/dL — ABNORMAL LOW (ref 11.6–15.9)
LYMPH%: 17.5 % (ref 14.0–48.0)
MCH: 33.9 pg (ref 26.0–34.0)
MCHC: 32.8 g/dL (ref 32.0–36.0)
MCV: 103 fL — ABNORMAL HIGH (ref 81–101)
MONO%: 14.4 % — ABNORMAL HIGH (ref 0.0–13.0)
NEUT%: 65.9 % (ref 39.6–80.0)
RBC: 2.42 10*6/uL — ABNORMAL LOW (ref 3.70–5.32)

## 2012-01-15 NOTE — Progress Notes (Signed)
This office note has been dictated.

## 2012-01-16 NOTE — Progress Notes (Signed)
CC:   [name] Sherry Goodell, MD  DIAGNOSIS:  Metastatic cholangiocarcinoma.  CURRENT THERAPY:  Status post 7 cycles of FOLFOX.  INTERIM HISTORY:  [name] comes in for followup.  She is doing pretty well.  She has no specific complaints since we last saw her.  She tolerated the chemotherapy incredibly well.  She has not noted any issues with bleeding.  There has been no melena.  She has had a little nausea but no vomiting.  There has been no diarrhea.  There has been no leg swelling.  She has had no cough.  There has been no headache.  PHYSICAL EXAMINATION:  This is a well-developed, well-nourished black female in no obvious distress.  Vital signs:  98, pulse 55, respiratory rate 20, blood pressure 130/83.  Weight is 160.  Head and neck: Normocephalic, atraumatic skull.  There are no ocular or oral lesions. There are no palpable cervical or supraclavicular lymph nodes.  Lungs: Clear bilaterally.  No rales, wheezes or rhonchi.  Cardiac:  Regular rate and rhythm with a normal S1 and S2.  There are no murmurs, rubs or bruits.  Abdomen:  Soft with good bowel sounds.  She has well-healed laparotomy scars.  There is no fluid wave.  There is no guarding or rebound tenderness.  There is no palpable hepatosplenomegaly.  Back:  No tenderness over the spine, ribs, or hips.  Extremities: No clubbing, cyanosis or edema.  Neurologic: No focal neurological deficits.  LABORATORY STUDIES:  White cell count is 2.9, hemoglobin 8.2, hematocrit 25, platelet count is 48,000.  IMPRESSION:  [name] is a 61 year old African American female with metastatic cholangiocarcinoma.  She is on FOLFOX.  Unfortunately, we will have to old her chemotherapy for a week.  I really think that she needs to have a "vacation" from chemotherapy.  I really believe that her blood counts are starting to have more difficulty.  Thankfully, she has tolerated treatment well.  We are, however, having more delays now.  I would delay her  1 week.  We will then plan to get a CT scan on her to assess for her disease.  If all looks good with the CT scan, then we try to give her a break through summertime and then reassess for sometime around Labor Day.  We will get [name] in next week to see [place].    ______________________________ Josph Macho, M.D. PRE/MEDQ  D:  01/15/2012  T:  01/16/2012  Job:  [id number]

## 2012-01-21 ENCOUNTER — Ambulatory Visit: Payer: Medicare Other

## 2012-01-21 ENCOUNTER — Ambulatory Visit (HOSPITAL_BASED_OUTPATIENT_CLINIC_OR_DEPARTMENT_OTHER): Payer: Medicare Other | Admitting: Hematology & Oncology

## 2012-01-21 ENCOUNTER — Other Ambulatory Visit (HOSPITAL_BASED_OUTPATIENT_CLINIC_OR_DEPARTMENT_OTHER): Payer: Medicare Other | Admitting: Lab

## 2012-01-21 VITALS — BP 146/96 | HR 72 | Temp 98.6°F | Ht 63.0 in | Wt 160.0 lb

## 2012-01-21 DIAGNOSIS — C221 Intrahepatic bile duct carcinoma: Secondary | ICD-10-CM

## 2012-01-21 DIAGNOSIS — C7800 Secondary malignant neoplasm of unspecified lung: Secondary | ICD-10-CM

## 2012-01-21 DIAGNOSIS — D509 Iron deficiency anemia, unspecified: Secondary | ICD-10-CM

## 2012-01-21 DIAGNOSIS — K922 Gastrointestinal hemorrhage, unspecified: Secondary | ICD-10-CM

## 2012-01-21 DIAGNOSIS — T451X5A Adverse effect of antineoplastic and immunosuppressive drugs, initial encounter: Secondary | ICD-10-CM

## 2012-01-21 LAB — CBC WITH DIFFERENTIAL (CANCER CENTER ONLY)
BASO%: 0.6 % (ref 0.0–2.0)
EOS%: 1.2 % (ref 0.0–7.0)
Eosinophils Absolute: 0 10*3/uL (ref 0.0–0.5)
LYMPH%: 29.1 % (ref 14.0–48.0)
MCH: 33.9 pg (ref 26.0–34.0)
MCHC: 32.5 g/dL (ref 32.0–36.0)
MCV: 104 fL — ABNORMAL HIGH (ref 81–101)
MONO%: 36.4 % — ABNORMAL HIGH (ref 0.0–13.0)
Platelets: 68 10*3/uL — ABNORMAL LOW (ref 145–400)
RDW: 14.7 % (ref 11.1–15.7)

## 2012-01-21 NOTE — Progress Notes (Signed)
This office note has been dictated.

## 2012-01-21 NOTE — Progress Notes (Signed)
CC:   [name] Marina Goodell, MD  DIAGNOSIS:  Metastatic cholangiocarcinoma.  CURRENT THERAPY:  The patient is status post 7 cycles of FOLFOX.  INTERIM HISTORY:  [name] comes in for followup.  We held her chemotherapy last week because of her blood counts.  Unfortunately, we are going to hold it again.  Her blood counts really have not gotten much better.  [name] feels better.  She enjoyed not having to take treatment.  In fact, she has a family reunion coming up this weekend.  She should be able to feel good for this also.  She has had no cough.  There is no abdominal pain.  She has had no fevers, sweats or chills.  There has been no bleeding.  There has been no leg swelling.  She has had no melena or bright red blood per rectum.  PHYSICAL EXAM:  This is a well-developed, well-nourished black female in no obvious distress.  Vital signs:  98.6, pulse 72, respiratory rate 20, blood pressure 146/96.  Weight is 160.  Head and neck:  Shows a normocephalic, atraumatic skull.  There are no ocular or oral lesions. There are no palpable cervical or supraclavicular lymph nodes.  Lungs: Clear bilaterally.  Cardiac:  Regular rate and rhythm with a normal S1 and S2.  There are no murmurs, rubs or bruits.  Abdomen:  Shows laparotomy scars that are well-healed.  She has no fluid wave.  There is no guarding or rebound tenderness.  There is no palpable hepatosplenomegaly.  Back:  No tenderness over the spine, ribs, or hips. Extremities:  No clubbing, cyanosis or edema.  Neurological:  Exam shows no focal neurological deficits.  LAB:  White cell count is 1.7, hemoglobin 8.7, hematocrit 26.8, platelet count 68,000.  IMPRESSION:  [name] is a 61 year old African American female with metastatic cholangiocarcinoma.  We have her on systemic chemotherapy with FOLFOX.  Unfortunately, I think that she is really "reached her limit" of chemo for now.  Her blood counts really have not recovered like I  thought they would.  We are going to have to just give her a "holiday" from chemo and hopefully get her through the summertime without having to treat her.  I do want to get her set up with a followup CT scan.  I want see where we stand with respect to her disease.  Again, hopefully we can get her through summertime without having to treat her.  If we have to retreat her, our options are certainly limited.  She seemed to do well with FOLFOX.  I would probably go back to the FOLFOX regimen and just try to adjust the doses for tolerability.  I will have her come back to see me in 1 month.  We will be checking her lab work at that point in time.    ______________________________ Josph Macho, M.D. PRE/MEDQ  D:  01/21/2012  T:  01/21/2012  Job:  [id number]

## 2012-01-21 NOTE — Progress Notes (Unsigned)
Pt's tx was held today per Dr. Myna Hidalgo

## 2012-01-27 ENCOUNTER — Ambulatory Visit (HOSPITAL_BASED_OUTPATIENT_CLINIC_OR_DEPARTMENT_OTHER)
Admission: RE | Admit: 2012-01-27 | Discharge: 2012-01-27 | Disposition: A | Discharge status: Home / Self Care | Payer: Medicare Other | Source: Ambulatory Visit | Attending: Hematology & Oncology | Admitting: Hematology & Oncology

## 2012-01-27 DIAGNOSIS — Z9221 Personal history of antineoplastic chemotherapy: Secondary | ICD-10-CM | POA: Insufficient documentation

## 2012-01-27 DIAGNOSIS — J90 Pleural effusion, not elsewhere classified: Secondary | ICD-10-CM | POA: Insufficient documentation

## 2012-01-27 DIAGNOSIS — C221 Intrahepatic bile duct carcinoma: Secondary | ICD-10-CM

## 2012-01-27 MED ORDER — IOHEXOL 300 MG/ML  SOLN
100.0000 mL | Freq: Once | INTRAMUSCULAR | Status: AC | PRN
  Administered 2012-01-27: 100 mL via INTRAVENOUS

## 2012-02-19 ENCOUNTER — Ambulatory Visit: Payer: Medicare Other | Admitting: Hematology & Oncology

## 2012-02-19 ENCOUNTER — Other Ambulatory Visit: Payer: Medicare Other | Admitting: Lab

## 2012-02-26 ENCOUNTER — Ambulatory Visit (HOSPITAL_BASED_OUTPATIENT_CLINIC_OR_DEPARTMENT_OTHER): Payer: Medicare Other | Admitting: Hematology & Oncology

## 2012-02-26 ENCOUNTER — Other Ambulatory Visit (HOSPITAL_BASED_OUTPATIENT_CLINIC_OR_DEPARTMENT_OTHER): Payer: Medicare Other | Admitting: Lab

## 2012-02-26 ENCOUNTER — Telehealth (INDEPENDENT_AMBULATORY_CARE_PROVIDER_SITE_OTHER): Payer: Self-pay

## 2012-02-26 VITALS — BP 144/98 | HR 59 | Temp 98.3°F | Ht 63.0 in | Wt 160.0 lb

## 2012-02-26 DIAGNOSIS — L989 Disorder of the skin and subcutaneous tissue, unspecified: Secondary | ICD-10-CM

## 2012-02-26 DIAGNOSIS — C221 Intrahepatic bile duct carcinoma: Secondary | ICD-10-CM

## 2012-02-26 DIAGNOSIS — T451X5A Adverse effect of antineoplastic and immunosuppressive drugs, initial encounter: Secondary | ICD-10-CM

## 2012-02-26 LAB — CBC WITH DIFFERENTIAL (CANCER CENTER ONLY)
BASO#: 0 10*3/uL (ref 0.0–0.2)
Eosinophils Absolute: 0.1 10*3/uL (ref 0.0–0.5)
HCT: 29 % — ABNORMAL LOW (ref 34.8–46.6)
HGB: 9.4 g/dL — ABNORMAL LOW (ref 11.6–15.9)
LYMPH#: 0.4 10*3/uL — ABNORMAL LOW (ref 0.9–3.3)
NEUT#: 3.1 10*3/uL (ref 1.5–6.5)
NEUT%: 76.1 % (ref 39.6–80.0)
RBC: 2.84 10*6/uL — ABNORMAL LOW (ref 3.70–5.32)

## 2012-02-26 LAB — COMPREHENSIVE METABOLIC PANEL
Albumin: 3.3 g/dL — ABNORMAL LOW (ref 3.5–5.2)
BUN: 13 mg/dL (ref 6–23)
CO2: 29 mEq/L (ref 19–32)
Calcium: 9.2 mg/dL (ref 8.4–10.5)
Chloride: 107 mEq/L (ref 96–112)
Creatinine, Ser: 0.89 mg/dL (ref 0.50–1.10)
Glucose, Bld: 79 mg/dL (ref 70–99)
Potassium: 3.9 mEq/L (ref 3.5–5.3)

## 2012-02-26 NOTE — Progress Notes (Signed)
This office note has been dictated. He is

## 2012-02-27 NOTE — Progress Notes (Signed)
CC:   Adolph Pollack, M.D. [name]. Marina Goodell, MD  DIAGNOSIS:  Metastatic cholangiocarcinoma.  CURRENT THERAPY:  The patient on  a "chemo holiday."  INTERIM HISTORY:  Ms. Uehara comes in for followup.  She is looking a lot better.  She feels okay.  Unfortunately, she has a painful area on her right side.  She noticed this for about 2 or 3 weeks.  The family did go down to [place] for a vacation. They went down to [place].  They drove all the way down there.  She had a nice time down there.  We have given her a "vacation" from chemotherapy because of toxicity issues.  Her bone marrow just was not recovering.  She also was getting more in the way of fatigue.  There has been no cough.  She has had no abdominal pain.  Her appetite has been okay.  She has not noted any change in bowel or bladder habits.  There has been no leg swelling. There has been no rashes.  PHYSICAL EXAMINATION:  General:  This is a well-developed, well- nourished black female in no obvious distress.  Vital signs: Temperature of 98.3, pulse 59, respiratory rate 18, blood pressure 144/98.  Weight is 160.  Head and neck:  Normocephalic, atraumatic skull.  There are no ocular or oral lesions.  There are no palpable cervical or supraclavicular lymph nodes.  Lungs:  Clear bilaterally. Cardiac:  Regular rate and rhythm with a normal S1, S2.  There are no murmurs, rubs or bruits.  Abdomen:  Soft with good bowel sounds.  There is no palpable abdominal mass.  There is no fluid wave.  There is no palpable hepatosplenomegaly.  Back exam:  Does show a subcutaneous nodule on the right lateral chest wall.  This is tender.  It is somewhat mobile.  It measures about 2 x 2 cm.  No tenderness is noted over the spine or hips.  Extremities:  Shows no clubbing, cyanosis or edema. Skin:  No rashes, ecchymosis or petechia.  LABORATORIES STUDIES:  White count is 4.1, hemoglobin 9.4, hematocrit 29, platelet count 72,000.  IMPRESSION:  Ms.  Podgorski is a 61 year old, African American female with metastatic cholangiocarcinoma.  She had been doing well with respect to chemotherapy, but started to develop toxicity from chemotherapy.  This right lateral chest wall lesion certainly is worrisome for me. Unfortunately, I suspect that this probably a met from her cholangiocarcinoma.  I have spoken with Dr. Adolph Pollack of [facility]. He will see Mr. Gruhn, hopefully this week to try to take out this nodule.  I want to try to give Ms. Marchuk as much of a "break" from chemotherapy. She really deserves to have a break. I am going to repeat her scans in about 4 weeks.  I will see Ms. Wildermuth back after her scans are complete.  I may need to see her back sooner depending on what we find with this lateral chest wall nodule.    ______________________________ Josph Macho, M.D. PRE/MEDQ  D:  02/26/2012  T:  02/27/2012  Job:  [id number]

## 2012-03-01 ENCOUNTER — Ambulatory Visit (INDEPENDENT_AMBULATORY_CARE_PROVIDER_SITE_OTHER): Payer: Self-pay | Admitting: General Surgery

## 2012-03-04 ENCOUNTER — Ambulatory Visit (INDEPENDENT_AMBULATORY_CARE_PROVIDER_SITE_OTHER): Payer: Medicare Other | Admitting: General Surgery

## 2012-03-04 ENCOUNTER — Encounter (HOSPITAL_COMMUNITY): Payer: Self-pay | Admitting: *Deleted

## 2012-03-04 ENCOUNTER — Telehealth (INDEPENDENT_AMBULATORY_CARE_PROVIDER_SITE_OTHER): Payer: Self-pay

## 2012-03-04 ENCOUNTER — Encounter (INDEPENDENT_AMBULATORY_CARE_PROVIDER_SITE_OTHER): Payer: Self-pay | Admitting: General Surgery

## 2012-03-04 ENCOUNTER — Encounter (HOSPITAL_COMMUNITY): Payer: Self-pay | Admitting: Pharmacy Technician

## 2012-03-04 VITALS — BP 140/88 | HR 68 | Temp 98.1°F | Resp 16 | Ht 63.5 in | Wt 156.4 lb

## 2012-03-04 DIAGNOSIS — R222 Localized swelling, mass and lump, trunk: Secondary | ICD-10-CM | POA: Insufficient documentation

## 2012-03-04 NOTE — Telephone Encounter (Signed)
Dr. Jean Rosenthal called to report the pt's pre op hgb is 7, and platelets are 70,000.  Pt has a hx of cholangiocarcinoma, and is scheduled to have a chest wall mass removed tomorrow at [facility] surgery.  They cannot cross and type pt's at [facility] if a transfusion is needed.  Unable to reach Dr. Abbey Chatters.

## 2012-03-04 NOTE — Telephone Encounter (Signed)
Close encounter 

## 2012-03-04 NOTE — Progress Notes (Signed)
Reviewed notes with dr Alwyn Ren and plt ct last week low-she put call into [facility] for dr Abbey Chatters to see if surg needs to be done at [facility] if he needs to give blood Pt will come in 2 hr early for cbc,pt and bmet

## 2012-03-04 NOTE — Progress Notes (Signed)
Patient ID: [name], female   DOB: 09/19/1950, 61 y.o.   MRN: [id number]  Chief Complaint  Patient presents with  . Cancer    Cholangiocarcinoma    HPI MARQUITE [name] is a 61 y.o. female.   HPI  She is referred by Dr. Myna Hidalgo of her because of a painful right lower chest wall nodule.  She has metastatic cholangiocarcinoma. She noticed this nodule 3 months ago it was very small. It has become larger and more painful and now she is unable to sleep on her side at night. It is demonstrated on a CT scan which I reviewed. She currently is off chemotherapy.  Past Medical History  Diagnosis Date  . Hypertension   . Hyperlipidemia   . GERD (gastroesophageal reflux disease)   . Depression   . Esophageal stricture 2003     [place] post esophageal dilation in 2003 performed by Dr. Marina Goodell, second esophageal dilation performed in 2006  by Dr. Marina Goodell  . Obstructive jaundice due to malignant neoplasm  February 2012     status post ERCP done by Dr. Marina Goodell with biliary sphincterotomy, bile duct stricture brushings, biliary stent placement, performed secondary to obstructive jaundice, malignant-appearing bile duct stricture of the common hepatic duct worrisome for primary cholangiocarcinoma, status post ERCP with biliary sphincterotomy and biliary stent placement...status post biopsy -  markedly atypical cells cons  . Rotator cuff syndrome 2010     per MRI March 2010 there is a prominent articular surface partial tear of the supraspinatus which is nearly full thickness, as well as a tear of the distal anterior supraspinatus which probably represents a full-thickness partial tear,  with moderate supraspinatus tendinopathy, moderate infraspinatus tendinopathy and prominent undersurface tear of the acromion along the coracoacromial ligament  . Hiatal hernia   . SOB (shortness of breath) on exertion   . Bile duct adenocarcinoma   . Myocardial infarction   . Angina   . Headache   . Arthritis   . Dysrhythmia     . Peripheral vascular disease   . Anemia   . Diarrhea   . Nausea   . Cholangiocarcinoma     Past Surgical History  Procedure Date  . Cholecystectomy  2007  .  aortogram   August 2011     ultrasound-guided access to the right common femoral artery, an aortogram with bilateral iliac arteriogram and bilateral lower extremity runoff, this was performed secondary to progressive bilateral lower extremity claudication, left common iliac artery occlusion as well as short segment of right superficial femoral artery occlusion, procedure performed by Dr. Durwin Nora  . Bypass graft  January 2009     of the infected femoral-femoral graft with vein patch angioplasty of bilateral femoral artery, 7 by [name],   .  abdominal aortic angiography  September 2008     performed by Dr. 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 Cardiac catheterization  August 2011     patent coronary arteries with diffuse nonobstructive disease, there are no areas of high grade stenosis present,  determined the chest pain is most likely noncardiac in origin, continue medical management.  . Bile duct surgery     60% of liver removed  . Arthroscopy knee w/ drilling   . Esophagogastroduodenoscopy 07/10/2011    Procedure: ESOPHAGOGASTRODUODENOSCOPY (EGD);  Surgeon: Yancey Flemings, MD;  Location: [facility];  Service: Endoscopy;  Laterality: N/A;  . Liver resection     section of liver resceted due to bile duct ca  . Esophagogastroduodenoscopy 07/20/2011    Procedure: ESOPHAGOGASTRODUODENOSCOPY (EGD);  Surgeon: Erick Blinks, MD;  Location: [facility];  Service: Gastroenterology;  Laterality: N/A;  . Esophagogastroduodenoscopy 07/31/2011    Procedure: ESOPHAGOGASTRODUODENOSCOPY (EGD);  Surgeon: Yancey Flemings, MD;  Location: [facility];  Service: Endoscopy;   Laterality: N/A;    Family History  Problem Relation Age of Onset  . Heart disease Brother     Social History History  Substance Use Topics  . Smoking status: Former Smoker    Quit date: 06/14/1999  . Smokeless tobacco: Never Used  . Alcohol Use: No    Allergies  Allergen Reactions  . Promethazine Hcl Anxiety and Other (See Comments)    Change in LOC  . Sulfonamide Derivatives Hives    All over the body    Current Outpatient Prescriptions  Medication Sig Dispense Refill  . ALPHA LIPOIC ACID PO Take by mouth every morning.      . B Complex Vitamins (VITAMIN B COMPLEX IJ) Inject as directed every morning.      Marland Kitchen dexamethasone (DECADRON) 4 MG tablet Take 4-8 mg by mouth daily with breakfast. Take 2 tabs by mouth days 1 and 3 of chemo cycle, then 1 tab by mouth days 4 and 5 of each cycle of chemo      . folic acid (FOLVITE) 1 MG tablet Take 1 tablet (1 mg total) by mouth daily.  30 tablet  0  . gabapentin (NEURONTIN) 300 MG capsule Take 2 capsules (600 mg total) by mouth 3 (three) times daily.  180 capsule  3  . lidocaine-prilocaine (EMLA) cream Apply one hour before chemo.  30 g  3  . metoCLOPramide (REGLAN) 10 MG tablet Take 1 tablet (10 mg total) by mouth every 6 (six) hours as needed.  30 tablet  3  . metoprolol (TOPROL-XL) 100 MG 24 hr tablet Take 1 tablet (100 mg total) by mouth daily.  90 tablet  3  . nitroGLYCERIN (NITROSTAT) 0.4 MG SL tablet Place 1 tablet (0.4 mg total) under the tongue every 5 (five) minutes as needed. Up to 3 times for chest pain  10 tablet  1  . Omeprazole 20 MG TBEC Take 1 tablet (20 mg total) by mouth 2 (two) times daily.  180 each  3  . oxycodone (OXY-IR) 5 MG capsule Take 1 capsule (5 mg total) by mouth every 6 (six) hours as needed for pain (May take 1-2 po q 6H prn). For pain  30 capsule  0  . promethazine (PHENERGAN) 12.5 MG tablet Take 12.5 mg by mouth every 6 (six) hours as needed. For nausea      . simvastatin (ZOCOR) 40 MG tablet Take 1  tablet (40 mg total) by mouth at bedtime.  31 tablet  1  . sucralfate (CARAFATE) 1 GM/10ML suspension Take 1 g by mouth every 6 (six) hours.        Review of Systems Review of Systems  Constitutional: Negative for fever and chills.  Cardiovascular: Positive for chest pain.  Gastrointestinal: Abdominal distention: right lateral chest wall.  Musculoskeletal: Positive for arthralgias.  Hematological: Negative.     Blood pressure 140/88, pulse 68, temperature 98.1 F (36.7 C), temperature source Temporal, resp. rate 16, height 5' 3.5" (1.613 m), weight 156 lb 6 oz (70.931 kg).  Physical Exam Physical Exam  Constitutional: She appears well-developed and well-nourished.  No distress.  Cardiovascular: Normal rate and regular rhythm.   Pulmonary/Chest: Effort normal and breath sounds normal.       Port-A-Cath in right upper chest.  Tender 4 cm nodule in the right inferior lateral chest wall that is not mobile  Abdominal: Soft. There is no tenderness.       Multiple scars are present  Lymphadenopathy:    She has no cervical adenopathy.    Data Reviewed Dr. Gustavo Lah notes.  CT. Lab reports.  Assessment    Symptomatic right chest wall nodule that is enlarging and most metastatic cholangiocarcinoma.  He counts are recovering after her chemotherapy.    Plan    Removal of right chest wall nodule for diagnostic as well as likely palliative purposes. The procedure and risks have been discussed with her. Risks include but are not limited to bleeding, infection, wound healing problems, anesthesia, recurrence.       Kristle Wesch J 03/04/2012, 12:37 PM

## 2012-03-04 NOTE — Telephone Encounter (Signed)
Notified the patient her surgery on 03/05/12 has been moved to [facility] Stay from [facility] .  Dr. Abbey Chatters is aware.

## 2012-03-04 NOTE — Progress Notes (Signed)
Pt called to get instructions

## 2012-03-04 NOTE — Patient Instructions (Signed)
We will schedule your surgery today. 

## 2012-03-05 ENCOUNTER — Encounter (HOSPITAL_COMMUNITY)
Admission: RE | Disposition: A | Discharge status: Home / Self Care | Payer: Self-pay | Source: Ambulatory Visit | Attending: General Surgery

## 2012-03-05 ENCOUNTER — Ambulatory Visit (HOSPITAL_BASED_OUTPATIENT_CLINIC_OR_DEPARTMENT_OTHER): Admission: RE | Admit: 2012-03-05 | Payer: Medicare Other | Source: Ambulatory Visit | Admitting: General Surgery

## 2012-03-05 ENCOUNTER — Encounter (HOSPITAL_COMMUNITY): Payer: Self-pay | Admitting: Anesthesiology

## 2012-03-05 ENCOUNTER — Ambulatory Visit (HOSPITAL_COMMUNITY)
Admission: RE | Admit: 2012-03-05 | Discharge: 2012-03-05 | Disposition: A | Discharge status: Home / Self Care | Payer: Medicare Other | Source: Ambulatory Visit | Attending: General Surgery | Admitting: General Surgery

## 2012-03-05 ENCOUNTER — Encounter (HOSPITAL_COMMUNITY): Payer: Self-pay

## 2012-03-05 ENCOUNTER — Ambulatory Visit (HOSPITAL_COMMUNITY): Payer: Medicare Other | Admitting: Anesthesiology

## 2012-03-05 ENCOUNTER — Encounter (HOSPITAL_BASED_OUTPATIENT_CLINIC_OR_DEPARTMENT_OTHER): Admission: RE | Payer: Self-pay | Source: Ambulatory Visit

## 2012-03-05 DIAGNOSIS — R0602 Shortness of breath: Secondary | ICD-10-CM | POA: Insufficient documentation

## 2012-03-05 DIAGNOSIS — C50919 Malignant neoplasm of unspecified site of unspecified female breast: Secondary | ICD-10-CM | POA: Insufficient documentation

## 2012-03-05 DIAGNOSIS — I252 Old myocardial infarction: Secondary | ICD-10-CM | POA: Insufficient documentation

## 2012-03-05 DIAGNOSIS — C779 Secondary and unspecified malignant neoplasm of lymph node, unspecified: Secondary | ICD-10-CM

## 2012-03-05 DIAGNOSIS — F411 Generalized anxiety disorder: Secondary | ICD-10-CM | POA: Insufficient documentation

## 2012-03-05 DIAGNOSIS — I10 Essential (primary) hypertension: Secondary | ICD-10-CM | POA: Insufficient documentation

## 2012-03-05 DIAGNOSIS — R51 Headache: Secondary | ICD-10-CM | POA: Insufficient documentation

## 2012-03-05 DIAGNOSIS — I499 Cardiac arrhythmia, unspecified: Secondary | ICD-10-CM | POA: Insufficient documentation

## 2012-03-05 DIAGNOSIS — K219 Gastro-esophageal reflux disease without esophagitis: Secondary | ICD-10-CM | POA: Insufficient documentation

## 2012-03-05 DIAGNOSIS — K449 Diaphragmatic hernia without obstruction or gangrene: Secondary | ICD-10-CM | POA: Insufficient documentation

## 2012-03-05 DIAGNOSIS — C768 Malignant neoplasm of other specified ill-defined sites: Secondary | ICD-10-CM | POA: Insufficient documentation

## 2012-03-05 HISTORY — DX: Adverse effect of unspecified anesthetic, initial encounter: T41.45XA

## 2012-03-05 HISTORY — PX: MASS EXCISION: SHX2000

## 2012-03-05 HISTORY — DX: Other complications of anesthesia, initial encounter: T88.59XA

## 2012-03-05 LAB — CBC WITH DIFFERENTIAL/PLATELET
Hemoglobin: 9.9 g/dL — ABNORMAL LOW (ref 12.0–15.0)
Lymphocytes Relative: 14 % (ref 12–46)
Lymphs Abs: 0.7 10*3/uL (ref 0.7–4.0)
MCH: 32.2 pg (ref 26.0–34.0)
MCV: 98 fL (ref 78.0–100.0)
Monocytes Relative: 6 % (ref 3–12)
Neutrophils Relative %: 78 % — ABNORMAL HIGH (ref 43–77)
Platelets: 76 10*3/uL — ABNORMAL LOW (ref 150–400)
RBC: 3.07 MIL/uL — ABNORMAL LOW (ref 3.87–5.11)
WBC: 4.6 10*3/uL (ref 4.0–10.5)

## 2012-03-05 LAB — TYPE AND SCREEN
ABO/RH(D): A POS
Antibody Screen: NEGATIVE

## 2012-03-05 LAB — PROTIME-INR
INR: 1.16 (ref 0.00–1.49)
Prothrombin Time: 15 seconds (ref 11.6–15.2)

## 2012-03-05 LAB — SURGICAL PCR SCREEN: MRSA, PCR: NEGATIVE

## 2012-03-05 SURGERY — EXCISION MASS
Anesthesia: General | Site: Chest

## 2012-03-05 SURGERY — EXCISION MASS
Anesthesia: General | Site: Chest | Wound class: Clean

## 2012-03-05 MED ORDER — CEFAZOLIN SODIUM-DEXTROSE 2-3 GM-% IV SOLR
INTRAVENOUS | Status: AC
  Filled 2012-03-05: qty 100

## 2012-03-05 MED ORDER — ACETAMINOPHEN 325 MG PO TABS: 650.0000 mg | ORAL_TABLET | ORAL | Status: DC | PRN

## 2012-03-05 MED ORDER — SODIUM CHLORIDE 0.9 % IJ SOLN: 3.0000 mL | INTRAMUSCULAR | Status: DC | PRN

## 2012-03-05 MED ORDER — LACTATED RINGERS IV SOLN
INTRAVENOUS | Status: DC
  Administered 2012-03-05: 13:00:00 via INTRAVENOUS

## 2012-03-05 MED ORDER — LIDOCAINE HCL (CARDIAC) 20 MG/ML IV SOLN
INTRAVENOUS | Status: DC | PRN
  Administered 2012-03-05: 70 mg via INTRAVENOUS

## 2012-03-05 MED ORDER — HYDROMORPHONE HCL PF 1 MG/ML IJ SOLN
0.2500 mg | INTRAMUSCULAR | Status: DC | PRN
  Administered 2012-03-05 (×2): 0.25 mg via INTRAVENOUS

## 2012-03-05 MED ORDER — MUPIROCIN 2 % EX OINT
TOPICAL_OINTMENT | CUTANEOUS | Status: AC
  Administered 2012-03-05: 1 via NASAL
  Filled 2012-03-05: qty 22

## 2012-03-05 MED ORDER — CEFAZOLIN SODIUM-DEXTROSE 2-3 GM-% IV SOLR
2.0000 g | INTRAVENOUS | Status: AC
  Administered 2012-03-05: 2 g via INTRAVENOUS

## 2012-03-05 MED ORDER — MUPIROCIN 2 % EX OINT
TOPICAL_OINTMENT | Freq: Two times a day (BID) | CUTANEOUS | Status: DC
  Filled 2012-03-05: qty 22

## 2012-03-05 MED ORDER — BUPIVACAINE HCL (PF) 0.5 % IJ SOLN
INTRAMUSCULAR | Status: DC | PRN
  Administered 2012-03-05: 12 mL

## 2012-03-05 MED ORDER — ACETAMINOPHEN 650 MG RE SUPP: 650.0000 mg | RECTAL | Status: DC | PRN

## 2012-03-05 MED ORDER — MIDAZOLAM HCL 5 MG/5ML IJ SOLN
INTRAMUSCULAR | Status: DC | PRN
  Administered 2012-03-05: 2 mg via INTRAVENOUS

## 2012-03-05 MED ORDER — 0.9 % SODIUM CHLORIDE (POUR BTL) OPTIME
TOPICAL | Status: DC | PRN
  Administered 2012-03-05: 1000 mL

## 2012-03-05 MED ORDER — ONDANSETRON HCL 4 MG/2ML IJ SOLN: 4.0000 mg | Freq: Four times a day (QID) | INTRAMUSCULAR | Status: DC | PRN

## 2012-03-05 MED ORDER — MORPHINE SULFATE 2 MG/ML IJ SOLN: 2.0000 mg | INTRAMUSCULAR | Status: DC | PRN

## 2012-03-05 MED ORDER — PROPOFOL 10 MG/ML IV EMUL
INTRAVENOUS | Status: DC | PRN
  Administered 2012-03-05: 150 mg via INTRAVENOUS

## 2012-03-05 MED ORDER — LACTATED RINGERS IV SOLN
INTRAVENOUS | Status: DC | PRN
  Administered 2012-03-05: 14:00:00 via INTRAVENOUS

## 2012-03-05 MED ORDER — OXYCODONE HCL 5 MG PO TABS: 5.0000 mg | ORAL_TABLET | ORAL | Status: DC | PRN

## 2012-03-05 MED ORDER — FENTANYL CITRATE 0.05 MG/ML IJ SOLN
INTRAMUSCULAR | Status: DC | PRN
  Administered 2012-03-05 (×3): 50 ug via INTRAVENOUS

## 2012-03-05 MED ORDER — BUPIVACAINE HCL (PF) 0.5 % IJ SOLN
INTRAMUSCULAR | Status: AC
  Filled 2012-03-05: qty 30

## 2012-03-05 MED ORDER — HYDROMORPHONE HCL PF 1 MG/ML IJ SOLN
INTRAMUSCULAR | Status: AC
  Filled 2012-03-05: qty 1

## 2012-03-05 SURGICAL SUPPLY — 43 items
ADH SKN CLS APL DERMABOND .7 (GAUZE/BANDAGES/DRESSINGS)
APL SKNCLS STERI-STRIP NONHPOA (GAUZE/BANDAGES/DRESSINGS) ×1
BENZOIN TINCTURE PRP APPL 2/3 (GAUZE/BANDAGES/DRESSINGS) ×1 IMPLANT
BLADE SURG 10 STRL SS (BLADE) ×2 IMPLANT
BLADE SURG 15 STRL LF DISP TIS (BLADE) ×1 IMPLANT
BLADE SURG 15 STRL SS (BLADE) ×2
BLADE SURG ROTATE 9660 (MISCELLANEOUS) IMPLANT
CLOTH BEACON ORANGE TIMEOUT ST (SAFETY) ×2 IMPLANT
CLSR STERI-STRIP ANTIMIC 1/2X4 (GAUZE/BANDAGES/DRESSINGS) ×1 IMPLANT
COVER SURGICAL LIGHT HANDLE (MISCELLANEOUS) ×2 IMPLANT
DERMABOND ADVANCED (GAUZE/BANDAGES/DRESSINGS)
DERMABOND ADVANCED .7 DNX12 (GAUZE/BANDAGES/DRESSINGS) IMPLANT
DRAPE ORTHO SPLIT 77X108 STRL (DRAPES)
DRAPE PED LAPAROTOMY (DRAPES) IMPLANT
DRAPE SURG ORHT 6 SPLT 77X108 (DRAPES) IMPLANT
ELECT CAUTERY BLADE 6.4 (BLADE) ×2 IMPLANT
ELECT REM PT RETURN 9FT ADLT (ELECTROSURGICAL) ×2
ELECTRODE REM PT RTRN 9FT ADLT (ELECTROSURGICAL) ×1 IMPLANT
GLOVE BIOGEL PI IND STRL 8 (GLOVE) ×1 IMPLANT
GLOVE BIOGEL PI INDICATOR 8 (GLOVE) ×1
GLOVE ECLIPSE 8.0 STRL XLNG CF (GLOVE) ×2 IMPLANT
GOWN STRL NON-REIN LRG LVL3 (GOWN DISPOSABLE) ×4 IMPLANT
KIT BASIN OR (CUSTOM PROCEDURE TRAY) ×2 IMPLANT
KIT ROOM TURNOVER OR (KITS) ×2 IMPLANT
NS IRRIG 1000ML POUR BTL (IV SOLUTION) ×2 IMPLANT
PACK SURGICAL SETUP 50X90 (CUSTOM PROCEDURE TRAY) ×2 IMPLANT
PAD ARMBOARD 7.5X6 YLW CONV (MISCELLANEOUS) ×2 IMPLANT
PENCIL BUTTON HOLSTER BLD 10FT (ELECTRODE) ×2 IMPLANT
SPECIMEN JAR SMALL (MISCELLANEOUS) ×2 IMPLANT
SPONGE GAUZE 4X4 12PLY (GAUZE/BANDAGES/DRESSINGS) IMPLANT
SPONGE LAP 18X18 X RAY DECT (DISPOSABLE) ×2 IMPLANT
STRIP CLOSURE SKIN 1/2X4 (GAUZE/BANDAGES/DRESSINGS) IMPLANT
SUT ETHILON 3 0 FSL (SUTURE) IMPLANT
SUT ETHILON 4 0 PS 2 18 (SUTURE) IMPLANT
SUT MNCRL AB 3-0 PS2 18 (SUTURE) IMPLANT
SUT MON AB 4-0 PC3 18 (SUTURE) IMPLANT
SUT VIC AB 2-0 SH 27 (SUTURE)
SUT VIC AB 2-0 SH 27X BRD (SUTURE) IMPLANT
SUT VIC AB 3-0 SH 27 (SUTURE)
SUT VIC AB 3-0 SH 27XBRD (SUTURE) IMPLANT
TOWEL OR 17X24 6PK STRL BLUE (TOWEL DISPOSABLE) ×2 IMPLANT
TOWEL OR 17X26 10 PK STRL BLUE (TOWEL DISPOSABLE) ×2 IMPLANT
UNDERPAD 30X30 INCONTINENT (UNDERPADS AND DIAPERS) IMPLANT

## 2012-03-05 NOTE — Anesthesia Postprocedure Evaluation (Signed)
  Anesthesia Post-op Note  Patient: [name]  Procedure(s) Performed: Procedure(s) (LRB): EXCISION MASS (N/A)  Patient Location: PACU  Anesthesia Type: General  Level of Consciousness: awake  Airway and Oxygen Therapy: Patient Spontanous Breathing  Post-op Pain: mild  Post-op Assessment: Post-op Vital signs reviewed  Post-op Vital Signs: Reviewed  Complications: No apparent anesthesia complications

## 2012-03-05 NOTE — Anesthesia Postprocedure Evaluation (Signed)
  Anesthesia Post-op Note  Patient: [name]  Procedure(s) Performed: Procedure(s) (LRB): EXCISION MASS (N/A)  Patient Location: PACU  Anesthesia Type: General  Level of Consciousness: awake  Airway and Oxygen Therapy: Patient Spontanous Breathing  Post-op Pain: mild  Post-op Assessment: Post-op Vital signs reviewed  Post-op Vital Signs: Reviewed  Complications: No apparent anesthesia complications 

## 2012-03-05 NOTE — Interval H&P Note (Signed)
History and Physical Interval Note:  03/05/2012 2:01 PM  [name]  has presented today for surgery, with the diagnosis of chest wall mass  The various methods of treatment have been discussed with the patient and family. After consideration of risks, benefits and other options for treatment, the patient has consented to  Procedure(s) (LRB): EXCISION MASS (N/A) as a surgical intervention .  The patient's history has been reviewed, patient examined, no change in status, stable for surgery.  I have reviewed the patient's chart and labs.  Questions were answered to the patient's satisfaction.     [name] [place]

## 2012-03-05 NOTE — H&P (View-Only) (Signed)
Patient ID: [name], female   DOB: 06/09/1951, 61 y.o.   MRN: [id number]  Chief Complaint  Patient presents with  . Cancer    Cholangiocarcinoma    HPI [name] Husband is a 61 y.o. female.   HPI  She is referred by Dr. Ennever of her because of a painful right lower chest wall nodule.  She has metastatic cholangiocarcinoma. She noticed this nodule 3 months ago it was very small. It has become larger and more painful and now she is unable to sleep on her side at night. It is demonstrated on a CT scan which I reviewed. She currently is off chemotherapy.  Past Medical History  Diagnosis Date  . Hypertension   . Hyperlipidemia   . GERD (gastroesophageal reflux disease)   . Depression   . Esophageal stricture 2003     [place] post esophageal dilation in 2003 performed by Dr. Perry, second esophageal dilation performed in 2006  by Dr. Perry  . Obstructive jaundice due to malignant neoplasm  February 2012     status post ERCP done by Dr. Perry with biliary sphincterotomy, bile duct stricture brushings, biliary stent placement, performed secondary to obstructive jaundice, malignant-appearing bile duct stricture of the common hepatic duct worrisome for primary cholangiocarcinoma, status post ERCP with biliary sphincterotomy and biliary stent placement...status post biopsy -  markedly atypical cells cons  . Rotator cuff syndrome 2010     per MRI March 2010 there is a prominent articular surface partial tear of the supraspinatus which is nearly full thickness, as well as a tear of the distal anterior supraspinatus which probably represents a full-thickness partial tear,  with moderate supraspinatus tendinopathy, moderate infraspinatus tendinopathy and prominent undersurface tear of the acromion along the coracoacromial ligament  . Hiatal hernia   . SOB (shortness of breath) on exertion   . Bile duct adenocarcinoma   . Myocardial infarction   . Angina   . Headache   . Arthritis   . Dysrhythmia     . Peripheral vascular disease   . Anemia   . Diarrhea   . Nausea   . Cholangiocarcinoma     Past Surgical History  Procedure Date  . Cholecystectomy  2007  .  aortogram   August 2011     ultrasound-guided access to the right common femoral artery, an aortogram with bilateral iliac arteriogram and bilateral lower extremity runoff, this was performed secondary to progressive bilateral lower extremity claudication, left common iliac artery occlusion as well as short segment of right superficial femoral artery occlusion, procedure performed by Dr. Dixon  . Bypass graft  January 2009     of the infected femoral-femoral graft with vein patch angioplasty of bilateral femoral artery, 7 by Dr. Dickson,   .  abdominal aortic angiography  September 2008     performed by Dr. 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 Cardiac catheterization  August 2011     patent coronary arteries with diffuse nonobstructive disease, there are no areas of high grade stenosis present,  determined the chest pain is most likely noncardiac in origin, continue medical management.  . Bile duct surgery     60% of liver removed  . Arthroscopy knee w/ drilling   . Esophagogastroduodenoscopy 07/10/2011    Procedure: ESOPHAGOGASTRODUODENOSCOPY (EGD);  Surgeon: John Perry, MD;  Location: [facility];    Service: Endoscopy;  Laterality: N/A;  . Liver resection     section of liver resceted due to bile duct ca  . Esophagogastroduodenoscopy 07/20/2011    Procedure: ESOPHAGOGASTRODUODENOSCOPY (EGD);  Surgeon: Jay Pyrtle, MD;  Location: [facility];  Service: Gastroenterology;  Laterality: N/A;  . Esophagogastroduodenoscopy 07/31/2011    Procedure: ESOPHAGOGASTRODUODENOSCOPY (EGD);  Surgeon: John Perry, MD;  Location: [facility];  Service: Endoscopy;   Laterality: N/A;    Family History  Problem Relation Age of Onset  . Heart disease Brother     Social History History  Substance Use Topics  . Smoking status: Former Smoker    Quit date: 06/14/1999  . Smokeless tobacco: Never Used  . Alcohol Use: No    Allergies  Allergen Reactions  . Promethazine Hcl Anxiety and Other (See Comments)    Change in LOC  . Sulfonamide Derivatives Hives    All over the body    Current Outpatient Prescriptions  Medication Sig Dispense Refill  . ALPHA LIPOIC ACID PO Take by mouth every morning.      . B Complex Vitamins (VITAMIN B COMPLEX IJ) Inject as directed every morning.      . dexamethasone (DECADRON) 4 MG tablet Take 4-8 mg by mouth daily with breakfast. Take 2 tabs by mouth days 1 and 3 of chemo cycle, then 1 tab by mouth days 4 and 5 of each cycle of chemo      . folic acid (FOLVITE) 1 MG tablet Take 1 tablet (1 mg total) by mouth daily.  30 tablet  0  . gabapentin (NEURONTIN) 300 MG capsule Take 2 capsules (600 mg total) by mouth 3 (three) times daily.  180 capsule  3  . lidocaine-prilocaine (EMLA) cream Apply one hour before chemo.  30 g  3  . metoCLOPramide (REGLAN) 10 MG tablet Take 1 tablet (10 mg total) by mouth every 6 (six) hours as needed.  30 tablet  3  . metoprolol (TOPROL-XL) 100 MG 24 hr tablet Take 1 tablet (100 mg total) by mouth daily.  90 tablet  3  . nitroGLYCERIN (NITROSTAT) 0.4 MG SL tablet Place 1 tablet (0.4 mg total) under the tongue every 5 (five) minutes as needed. Up to 3 times for chest pain  10 tablet  1  . Omeprazole 20 MG TBEC Take 1 tablet (20 mg total) by mouth 2 (two) times daily.  180 each  3  . oxycodone (OXY-IR) 5 MG capsule Take 1 capsule (5 mg total) by mouth every 6 (six) hours as needed for pain (May take 1-2 po q 6H prn). For pain  30 capsule  0  . promethazine (PHENERGAN) 12.5 MG tablet Take 12.5 mg by mouth every 6 (six) hours as needed. For nausea      . simvastatin (ZOCOR) 40 MG tablet Take 1  tablet (40 mg total) by mouth at bedtime.  31 tablet  1  . sucralfate (CARAFATE) 1 GM/10ML suspension Take 1 g by mouth every 6 (six) hours.        Review of Systems Review of Systems  Constitutional: Negative for fever and chills.  Cardiovascular: Positive for chest pain.  Gastrointestinal: Abdominal distention: right lateral chest wall.  Musculoskeletal: Positive for arthralgias.  Hematological: Negative.     Blood pressure 140/88, pulse 68, temperature 98.1 [name] (36.7 C), temperature source Temporal, resp. rate 16, height 5' 3.5" (1.613 m), weight 156 lb 6 oz (70.931 kg).  Physical Exam Physical Exam  Constitutional: She appears well-developed and well-nourished.   No distress.  Cardiovascular: Normal rate and regular rhythm.   Pulmonary/Chest: Effort normal and breath sounds normal.       Port-A-Cath in right upper chest.  Tender 4 cm nodule in the right inferior lateral chest wall that is not mobile  Abdominal: Soft. There is no tenderness.       Multiple scars are present  Lymphadenopathy:    She has no cervical adenopathy.    Data Reviewed Dr. Ennever's notes.  CT. Lab reports.  Assessment    Symptomatic right chest wall nodule that is enlarging and most metastatic cholangiocarcinoma.  He counts are recovering after her chemotherapy.    Plan    Removal of right chest wall nodule for diagnostic as well as likely palliative purposes. The procedure and risks have been discussed with her. Risks include but are not limited to bleeding, infection, wound healing problems, anesthesia, recurrence.       Ola Fawver J 03/04/2012, 12:37 PM    

## 2012-03-05 NOTE — Transfer of Care (Signed)
Immediate Anesthesia Transfer of Care Note  Patient: [name]  Procedure(s) Performed: Procedure(s) (LRB): EXCISION MASS (N/A)  Patient Location: PACU  Anesthesia Type: General  Level of Consciousness: awake and sedated  Airway & Oxygen Therapy: Patient Spontanous Breathing and Patient connected to nasal cannula oxygen  Post-op Assessment: Report given to PACU RN and Post -op Vital signs reviewed and stable  Post vital signs: Reviewed and stable  Complications: No apparent anesthesia complications

## 2012-03-05 NOTE — Anesthesia Preprocedure Evaluation (Addendum)
Anesthesia Evaluation  Patient identified by MRN, date of birth, ID band Patient awake    Reviewed: Allergy & Precautions, H&P , NPO status , Patient's Chart, lab work & pertinent test results  Airway Mallampati: II      Dental   Pulmonary shortness of breath and with exertion,  breath sounds clear to auscultation        Cardiovascular hypertension, Pt. on medications + angina with exertion + Past MI + dysrhythmias Rhythm:Regular Rate:Normal     Neuro/Psych  Headaches, Anxiety    GI/Hepatic Neg liver ROS, hiatal hernia, GERD-  ,  Endo/Other  negative endocrine ROS  Renal/GU negative Renal ROS     Musculoskeletal   Abdominal   Peds  Hematology negative hematology ROS (+)   Anesthesia Other Findings   Reproductive/Obstetrics                          Anesthesia Physical Anesthesia Plan  ASA: III  Anesthesia Plan:    Post-op Pain Management:    Induction: Intravenous  Airway Management Planned: Oral ETT  Additional Equipment:   Intra-op Plan:   Post-operative Plan: Extubation in OR  Informed Consent: I have reviewed the patients History and Physical, chart, labs and discussed the procedure including the risks, benefits and alternatives for the proposed anesthesia with the patient or authorized representative who has indicated his/her understanding and acceptance.   Dental advisory given  Plan Discussed with: Anesthesiologist and Surgeon  Anesthesia Plan Comments:       Anesthesia Quick Evaluation

## 2012-03-05 NOTE — Op Note (Signed)
Operative Note  [name] female 61 y.o. 03/05/2012  PREOPERATIVE DX:  Right chest wall mass, history of metastatic cholangiocarcinoma  POSTOPERATIVE DX:  Same  PROCEDURE:  Excision of right chest wall mass         Surgeon: Adolph Pollack   Assistants: None  Anesthesia: General LMA anesthesia  Indications:    This is a 62 year old female who has metastatic cholangiocarcinoma. She has a right lower chest wall mass that's been growing for the past 3 months and is painful.  She now presents for removal of the mass for diagnostic purposes as well as to help her symptoms. The procedure, risks, and aftercare have been discussed with her.    Procedure Detail:  She was seen in the holding area and the mass in the right lower lateral chest wall more my initial. She's and brought to the operative room placed supine on the operating table and general anesthetic was given. The right lower lateral chest wall was sterilely prepped and draped.  An elliptical incision to the skin around the mass and used the electrocautery to separate the mass from the subcutaneous tissues. The mass was adherent somewhat to the chest wall musculature and I excised some of this to remove the mass from the chest wall. I then dissected it free from the remaining subcutaneous tissues and removed the mass which measured approximately 4-5 cm and was very firm. This was sent to pathology.  The wound was inspected and bleeding was controlled with electrocautery. Marcaine solution was then injected superficially and deep in the wound for local anesthetic effect. Hemostasis was adequate.  The subcutaneous tissues and approximated with interrupted 3-0 Vicryl sutures. The skin was closed a running 3-0 Monocryl suture.  Steri-Strips and a sterile dressing were applied.  She tolerated the procedure well without any apparent complications and was taken to the recovery room in satisfactory condition.   Estimated Blood Loss:   less than 50 mL         Drains: none  Blood Given: none          Specimens: Chest wall mass        Complications:  * No complications entered in OR log *         Disposition: PACU - hemodynamically stable.         Condition: stable

## 2012-03-06 ENCOUNTER — Encounter (HOSPITAL_COMMUNITY): Payer: Self-pay | Admitting: General Surgery

## 2012-03-11 ENCOUNTER — Other Ambulatory Visit: Payer: Self-pay | Admitting: Hematology & Oncology

## 2012-03-11 ENCOUNTER — Telehealth: Payer: Self-pay | Admitting: Hematology & Oncology

## 2012-03-11 NOTE — Telephone Encounter (Signed)
I left a message for 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 She is a set up for scans in a couple weeks. I actually may see about moving these scans up and trying to get them next week.  I told her that we are going to have to get her back into chemotherapy.our options are limited to some degree because of her blood counts and the treatments that she is taken to date.  I told her to call us if she has any problems and would be more happy to give her in and help her out.  I am just very disappointed that we got these results back. [name] is a tough lady and she'll be able to get through this.  Pete E.

## 2012-03-18 ENCOUNTER — Ambulatory Visit (HOSPITAL_BASED_OUTPATIENT_CLINIC_OR_DEPARTMENT_OTHER)
Admission: RE | Admit: 2012-03-18 | Discharge: 2012-03-18 | Disposition: A | Discharge status: Home / Self Care | Payer: Medicare Other | Source: Ambulatory Visit | Attending: Hematology & Oncology | Admitting: Hematology & Oncology

## 2012-03-18 ENCOUNTER — Ambulatory Visit (HOSPITAL_BASED_OUTPATIENT_CLINIC_OR_DEPARTMENT_OTHER): Payer: Medicare Other

## 2012-03-18 ENCOUNTER — Other Ambulatory Visit (HOSPITAL_BASED_OUTPATIENT_CLINIC_OR_DEPARTMENT_OTHER): Payer: Medicare Other | Admitting: Lab

## 2012-03-18 VITALS — BP 159/102 | HR 67 | Temp 97.0°F | Resp 20

## 2012-03-18 DIAGNOSIS — D509 Iron deficiency anemia, unspecified: Secondary | ICD-10-CM

## 2012-03-18 DIAGNOSIS — C221 Intrahepatic bile duct carcinoma: Secondary | ICD-10-CM

## 2012-03-18 DIAGNOSIS — K922 Gastrointestinal hemorrhage, unspecified: Secondary | ICD-10-CM

## 2012-03-18 DIAGNOSIS — C7800 Secondary malignant neoplasm of unspecified lung: Secondary | ICD-10-CM | POA: Insufficient documentation

## 2012-03-18 DIAGNOSIS — Z452 Encounter for adjustment and management of vascular access device: Secondary | ICD-10-CM

## 2012-03-18 DIAGNOSIS — Z9221 Personal history of antineoplastic chemotherapy: Secondary | ICD-10-CM | POA: Insufficient documentation

## 2012-03-18 LAB — CMP (CANCER CENTER ONLY)
ALT(SGPT): 25 U/L (ref 10–47)
AST: 32 U/L (ref 11–38)
Albumin: 3 g/dL — ABNORMAL LOW (ref 3.3–5.5)
BUN, Bld: 15 mg/dL (ref 7–22)
Calcium: 9.3 mg/dL (ref 8.0–10.3)
Chloride: 101 mEq/L (ref 98–108)
Potassium: 4.4 mEq/L (ref 3.3–4.7)
Sodium: 144 mEq/L (ref 128–145)
Total Protein: 7 g/dL (ref 6.4–8.1)

## 2012-03-18 LAB — CBC WITH DIFFERENTIAL (CANCER CENTER ONLY)
BASO#: 0 10*3/uL (ref 0.0–0.2)
BASO%: 0 % (ref 0.0–2.0)
EOS%: 2.8 % (ref 0.0–7.0)
HGB: 9.3 g/dL — ABNORMAL LOW (ref 11.6–15.9)
LYMPH#: 0.4 10*3/uL — ABNORMAL LOW (ref 0.9–3.3)
MCHC: 32.6 g/dL (ref 32.0–36.0)
NEUT#: 4.9 10*3/uL (ref 1.5–6.5)

## 2012-03-18 MED ORDER — SODIUM CHLORIDE 0.9 % IJ SOLN
10.0000 mL | INTRAMUSCULAR | Status: DC | PRN
  Administered 2012-03-18: 10 mL via INTRAVENOUS
  Filled 2012-03-18: qty 10

## 2012-03-18 MED ORDER — ALTEPLASE 2 MG IJ SOLR
2.0000 mg | Freq: Once | INTRAMUSCULAR | Status: DC | PRN
  Filled 2012-03-18: qty 2

## 2012-03-18 MED ORDER — IOHEXOL 300 MG/ML  SOLN
100.0000 mL | Freq: Once | INTRAMUSCULAR | Status: AC | PRN
  Administered 2012-03-18: 100 mL via INTRAVENOUS

## 2012-03-18 MED ORDER — HEPARIN SOD (PORK) LOCK FLUSH 100 UNIT/ML IV SOLN
500.0000 [IU] | Freq: Once | INTRAVENOUS | Status: AC
  Administered 2012-03-18: 500 [IU] via INTRAVENOUS
  Filled 2012-03-18: qty 5

## 2012-03-18 NOTE — Patient Instructions (Signed)

## 2012-03-23 ENCOUNTER — Other Ambulatory Visit: Payer: Self-pay | Admitting: Hematology & Oncology

## 2012-03-24 ENCOUNTER — Other Ambulatory Visit (HOSPITAL_BASED_OUTPATIENT_CLINIC_OR_DEPARTMENT_OTHER): Payer: Medicare Other | Admitting: Lab

## 2012-03-24 ENCOUNTER — Ambulatory Visit (HOSPITAL_BASED_OUTPATIENT_CLINIC_OR_DEPARTMENT_OTHER): Payer: Medicare Other | Admitting: Hematology & Oncology

## 2012-03-24 VITALS — BP 155/90 | HR 61 | Temp 97.9°F | Resp 20 | Ht 63.0 in | Wt 158.0 lb

## 2012-03-24 DIAGNOSIS — C221 Intrahepatic bile duct carcinoma: Secondary | ICD-10-CM

## 2012-03-24 DIAGNOSIS — C779 Secondary and unspecified malignant neoplasm of lymph node, unspecified: Secondary | ICD-10-CM

## 2012-03-24 LAB — CBC WITH DIFFERENTIAL (CANCER CENTER ONLY)
BASO%: 0.2 % (ref 0.0–2.0)
EOS%: 4 % (ref 0.0–7.0)
LYMPH#: 0.6 10*3/uL — ABNORMAL LOW (ref 0.9–3.3)
MCHC: 32.1 g/dL (ref 32.0–36.0)
MONO%: 11.6 % (ref 0.0–13.0)
NEUT#: 3 10*3/uL (ref 1.5–6.5)
Platelets: 84 10*3/uL — ABNORMAL LOW (ref 145–400)
RDW: 13.3 % (ref 11.1–15.7)

## 2012-03-24 LAB — COMPREHENSIVE METABOLIC PANEL
AST: 30 U/L (ref 0–37)
Albumin: 3.3 g/dL — ABNORMAL LOW (ref 3.5–5.2)
Alkaline Phosphatase: 123 U/L — ABNORMAL HIGH (ref 39–117)
Potassium: 4.8 mEq/L (ref 3.5–5.3)
Sodium: 141 mEq/L (ref 135–145)
Total Bilirubin: 0.3 mg/dL (ref 0.3–1.2)
Total Protein: 6.4 g/dL (ref 6.0–8.3)

## 2012-03-24 MED ORDER — ERLOTINIB HCL 150 MG PO TABS: 150.0000 mg | ORAL_TABLET | Freq: Every day | ORAL | Status: DC

## 2012-03-24 NOTE — Patient Instructions (Addendum)
Steps to Prevent Rash with Tarceva:  Apply skin moisturizer (i.e. Eucerin) to face, hands, feet, neck, back and chest daily in the morning  Use sunscreen (SPF >15) before going outdoors  Hydrocortisone 1% (applied to the same regions as the morning moisturizer) at bedtime.

## 2012-03-25 ENCOUNTER — Telehealth: Payer: Self-pay | Admitting: Hematology & Oncology

## 2012-03-25 ENCOUNTER — Ambulatory Visit (INDEPENDENT_AMBULATORY_CARE_PROVIDER_SITE_OTHER): Payer: Medicare Other | Admitting: General Surgery

## 2012-03-25 ENCOUNTER — Encounter (INDEPENDENT_AMBULATORY_CARE_PROVIDER_SITE_OTHER): Payer: Self-pay | Admitting: General Surgery

## 2012-03-25 ENCOUNTER — Other Ambulatory Visit (HOSPITAL_BASED_OUTPATIENT_CLINIC_OR_DEPARTMENT_OTHER): Payer: Medicare Other

## 2012-03-25 VITALS — BP 142/98 | HR 72 | Temp 96.4°F | Resp 16 | Ht 63.0 in | Wt 158.2 lb

## 2012-03-25 DIAGNOSIS — Z9889 Other specified postprocedural states: Secondary | ICD-10-CM

## 2012-03-25 NOTE — Progress Notes (Signed)
Operation:  Removal of chest wall mass  Date:  March 04, 2012  Pathology:  Metastatic cholangiocarcinoma  HPI:  She is here for her first postoperative visit. She feels much better and no longer has any pain in the right chest wall area.   Physical Exam: Right chest wall incision is clean and intact with some swelling.  Assessment:  Wound is healing well. Pathology consistent with metastatic disease and she is aware of the diagnosis.  Plan:  Activities as tolerated. Return visit as needed.

## 2012-03-25 NOTE — Progress Notes (Signed)
This office note has been dictated.

## 2012-03-25 NOTE — Telephone Encounter (Signed)
Pt aware of 8-16 appointment and get pick up schedule. She is aware of q 2 week treatments

## 2012-03-25 NOTE — Patient Instructions (Signed)
Activities as tolerated. 

## 2012-03-26 NOTE — Progress Notes (Signed)
CC:   Adolph Pollack, M.D. [name] Marina Goodell, MD  DIAGNOSIS:  Metastatic cholangiocarcinoma.  CURRENT THERAPY:  Observation.  INTERIM HISTORY:  [name] comes in for followup.  She did go ahead and have this right ribcage nodule removed.  Dr. Abbey Chatters did this for this.  This was done on 03/05/2012.  The pathology report ([id number]) showed metastatic adenocarcinoma.  It was close to the inked margin.  It was consistent with her cholangiocarcinoma.  She did have a followup CT scan.  This was done on 03/18/2012.  This did show progression of her lung disease.  She had an increase in her lung nodules.  There is no obvious disease below the diaphragm.  She has been off chemo now for about 2 months or so.  We had to give her a break from chemo as her blood counts were having a tough time.  She has felt okay.  She has had no pain issues.  There is no cough. There is no nausea or vomiting.  There has been no change in bowel or bladder habits.  She has had no headache.  She has not noted any bleeding.  She has had problems with bleeding in the past.  PHYSICAL EXAMINATION:  This is a fairly well-developed, well-nourished [place] American female in no obvious distress.  Vital signs:  97.9, pulse 51, respiratory rate 18, blood pressure 155/90.  Weight is 158. Head and neck:  Normocephalic, atraumatic skull.  There are no ocular or oral lesions.  There are no palpable cervical or supraclavicular lymph nodes.  Lungs:  Clear bilaterally.  Cardiac:  Regular rate and rhythm with a normal S1 and S2.  There are no murmurs, rubs or bruits. Abdomen:  Well-healed laparotomy scar.  She has no fluid wave.  There is no abdominal mass.  There is no guarding or rebound tenderness.  There is no palpable hepatosplenomegaly.  Back:  Excisional biopsy scar on the right lateral ribcage.  This is well-healed.  Extremities:  No clubbing, cyanosis or edema.  Neurologic:  No focal neurological  deficits.  LABORATORY STUDIES:  White cell count is 4.2, hemoglobin 9.6, hematocrit 29.9, platelet count is 84.  IMPRESSION:  [name] is a 61 year old African American female with metastatic cholangiocarcinoma.  She is has been off chemotherapy since May.  Her blood counts are recovering.  However, I really think that we need to keep her off actual chemotherapy for as long as possible.  She has been taking quite a bit treatment.  I think her bone marrow has had some difficulties with responding and recovering.  We have had to have numerous delays from her past chemo.  There was a nice trial that Avastin/Tarceva.  This, I believe, would be a reasonable option for her.  I do not believe this will affect her blood counts.  I think there is some documented response rate.  I spent almost an hour with [name] and her family.  I explained to them the rationale for using targeted therapy and not actual chemotherapy.  I explained that we would still need to follow her closely.  I would put her on Avastin every 2 weeks.  She gets Tarceva at 150 mg a day.  We will go ahead and try to get her started this week.  I gave her daughter information sheets about each medication.  I gave [name] information and instructions about skin care with the Tarceva.  Hopefully, we will see a response.  I will  do a CT scan in a couple of months.  We will have to keep in mind that if there is any recurrence at the biopsy site, that radiation therapy might be indicated.  I will plan to see [name] back in another month and we will see how she is doing.  We will have her come back in 2 weeks to see French Ana.    ______________________________ Josph Macho, M.D. PRE/MEDQ  D:  03/25/2012  T:  03/26/2012  Job:  [id number]

## 2012-03-27 ENCOUNTER — Ambulatory Visit (HOSPITAL_BASED_OUTPATIENT_CLINIC_OR_DEPARTMENT_OTHER): Payer: Medicare Other

## 2012-03-27 ENCOUNTER — Other Ambulatory Visit (HOSPITAL_BASED_OUTPATIENT_CLINIC_OR_DEPARTMENT_OTHER): Payer: Medicare Other | Admitting: Lab

## 2012-03-27 VITALS — BP 145/91 | HR 60 | Temp 97.0°F | Resp 18 | Ht 63.0 in | Wt 158.0 lb

## 2012-03-27 DIAGNOSIS — C221 Intrahepatic bile duct carcinoma: Secondary | ICD-10-CM

## 2012-03-27 DIAGNOSIS — Z5112 Encounter for antineoplastic immunotherapy: Secondary | ICD-10-CM

## 2012-03-27 DIAGNOSIS — C50919 Malignant neoplasm of unspecified site of unspecified female breast: Secondary | ICD-10-CM

## 2012-03-27 DIAGNOSIS — C779 Secondary and unspecified malignant neoplasm of lymph node, unspecified: Secondary | ICD-10-CM

## 2012-03-27 LAB — UA PROTEIN, DIPSTICK - CHCC SATELLITE: Protein, Urine: <30 mg/dL

## 2012-03-27 MED ORDER — DIPHENHYDRAMINE HCL 50 MG/ML IJ SOLN: 25.0000 mg | Freq: Once | INTRAMUSCULAR | Status: DC | PRN

## 2012-03-27 MED ORDER — EPINEPHRINE HCL 1 MG/ML IJ SOLN: 0.5000 mg | Freq: Once | INTRAMUSCULAR | Status: DC | PRN

## 2012-03-27 MED ORDER — BEVACIZUMAB CHEMO INJECTION 400 MG/16ML
5.0000 mg/kg | Freq: Once | INTRAVENOUS | Status: AC
  Administered 2012-03-27: 350 mg via INTRAVENOUS
  Filled 2012-03-27: qty 14

## 2012-03-27 MED ORDER — SODIUM CHLORIDE 0.9 % IJ SOLN
3.0000 mL | INTRAMUSCULAR | Status: DC | PRN
  Filled 2012-03-27: qty 10

## 2012-03-27 MED ORDER — HEPARIN SOD (PORK) LOCK FLUSH 100 UNIT/ML IV SOLN
250.0000 [IU] | Freq: Once | INTRAVENOUS | Status: DC | PRN
  Filled 2012-03-27: qty 5

## 2012-03-27 MED ORDER — DIPHENHYDRAMINE HCL 50 MG/ML IJ SOLN: 50.0000 mg | Freq: Once | INTRAMUSCULAR | Status: DC | PRN

## 2012-03-27 MED ORDER — SODIUM CHLORIDE 0.9 % IV SOLN: Freq: Once | INTRAVENOUS | Status: DC | PRN

## 2012-03-27 MED ORDER — SODIUM CHLORIDE 0.9 % IV SOLN
Freq: Once | INTRAVENOUS | Status: AC
  Administered 2012-03-27: 12:00:00 via INTRAVENOUS

## 2012-03-27 MED ORDER — HEPARIN SOD (PORK) LOCK FLUSH 100 UNIT/ML IV SOLN
500.0000 [IU] | Freq: Once | INTRAVENOUS | Status: AC | PRN
  Administered 2012-03-27: 500 [IU]
  Filled 2012-03-27: qty 5

## 2012-03-27 MED ORDER — EPINEPHRINE HCL 0.1 MG/ML IJ SOLN
0.2500 mg | Freq: Once | INTRAMUSCULAR | Status: DC | PRN
  Filled 2012-03-27: qty 10

## 2012-03-27 MED ORDER — ALBUTEROL SULFATE (2.5 MG/3ML) 0.083% IN NEBU
2.5000 mg | INHALATION_SOLUTION | Freq: Once | RESPIRATORY_TRACT | Status: DC | PRN
  Filled 2012-03-27: qty 3

## 2012-03-27 MED ORDER — METHYLPREDNISOLONE SODIUM SUCC 125 MG IJ SOLR: 125.0000 mg | Freq: Once | INTRAMUSCULAR | Status: DC | PRN

## 2012-03-27 MED ORDER — FAMOTIDINE IN NACL 20-0.9 MG/50ML-% IV SOLN: 20.0000 mg | Freq: Once | INTRAVENOUS | Status: DC | PRN

## 2012-03-27 MED ORDER — SODIUM CHLORIDE 0.9 % IJ SOLN
10.0000 mL | INTRAMUSCULAR | Status: DC | PRN
  Administered 2012-03-27: 10 mL
  Filled 2012-03-27: qty 10

## 2012-03-27 MED ORDER — ALTEPLASE 2 MG IJ SOLR
2.0000 mg | Freq: Once | INTRAMUSCULAR | Status: DC | PRN
  Filled 2012-03-27: qty 2

## 2012-03-30 ENCOUNTER — Encounter: Payer: Self-pay | Admitting: *Deleted

## 2012-03-30 ENCOUNTER — Encounter: Payer: Self-pay | Admitting: Hematology & Oncology

## 2012-04-01 ENCOUNTER — Ambulatory Visit: Payer: Medicare Other | Admitting: Hematology & Oncology

## 2012-04-01 ENCOUNTER — Other Ambulatory Visit: Payer: Medicare Other | Admitting: Lab

## 2012-04-08 ENCOUNTER — Ambulatory Visit (HOSPITAL_BASED_OUTPATIENT_CLINIC_OR_DEPARTMENT_OTHER): Payer: Medicare Other

## 2012-04-08 ENCOUNTER — Ambulatory Visit (HOSPITAL_BASED_OUTPATIENT_CLINIC_OR_DEPARTMENT_OTHER): Payer: Medicare Other | Admitting: Medical

## 2012-04-08 ENCOUNTER — Other Ambulatory Visit (HOSPITAL_BASED_OUTPATIENT_CLINIC_OR_DEPARTMENT_OTHER): Payer: Medicare Other | Admitting: Lab

## 2012-04-08 VITALS — BP 141/83 | HR 57 | Temp 98.0°F | Resp 18 | Ht 63.0 in | Wt 154.0 lb

## 2012-04-08 DIAGNOSIS — T451X5A Adverse effect of antineoplastic and immunosuppressive drugs, initial encounter: Secondary | ICD-10-CM

## 2012-04-08 DIAGNOSIS — C221 Intrahepatic bile duct carcinoma: Secondary | ICD-10-CM

## 2012-04-08 DIAGNOSIS — D61818 Other pancytopenia: Secondary | ICD-10-CM

## 2012-04-08 DIAGNOSIS — D6481 Anemia due to antineoplastic chemotherapy: Secondary | ICD-10-CM

## 2012-04-08 DIAGNOSIS — Z5112 Encounter for antineoplastic immunotherapy: Secondary | ICD-10-CM

## 2012-04-08 DIAGNOSIS — D509 Iron deficiency anemia, unspecified: Secondary | ICD-10-CM

## 2012-04-08 DIAGNOSIS — K922 Gastrointestinal hemorrhage, unspecified: Secondary | ICD-10-CM

## 2012-04-08 LAB — CBC WITH DIFFERENTIAL (CANCER CENTER ONLY)
BASO#: 0 10*3/uL (ref 0.0–0.2)
Eosinophils Absolute: 0.1 10*3/uL (ref 0.0–0.5)
HGB: 10 g/dL — ABNORMAL LOW (ref 11.6–15.9)
LYMPH%: 11.2 % — ABNORMAL LOW (ref 14.0–48.0)
MCH: 32.5 pg (ref 26.0–34.0)
MCV: 97 fL (ref 81–101)
MONO%: 12.5 % (ref 0.0–13.0)
RBC: 3.08 10*6/uL — ABNORMAL LOW (ref 3.70–5.32)

## 2012-04-08 LAB — COMPREHENSIVE METABOLIC PANEL
Alkaline Phosphatase: 127 U/L — ABNORMAL HIGH (ref 39–117)
BUN: 16 mg/dL (ref 6–23)
Creatinine, Ser: 1.08 mg/dL (ref 0.50–1.10)
Glucose, Bld: 88 mg/dL (ref 70–99)
Total Bilirubin: 0.6 mg/dL (ref 0.3–1.2)

## 2012-04-08 MED ORDER — SODIUM CHLORIDE 0.9 % IV SOLN
5.0000 mg/kg | Freq: Once | INTRAVENOUS | Status: AC
  Administered 2012-04-08: 350 mg via INTRAVENOUS
  Filled 2012-04-08: qty 14

## 2012-04-08 MED ORDER — SODIUM CHLORIDE 0.9 % IJ SOLN
10.0000 mL | INTRAMUSCULAR | Status: DC | PRN
  Administered 2012-04-08: 10 mL
  Filled 2012-04-08: qty 10

## 2012-04-08 MED ORDER — HEPARIN SOD (PORK) LOCK FLUSH 100 UNIT/ML IV SOLN
500.0000 [IU] | Freq: Once | INTRAVENOUS | Status: AC | PRN
  Administered 2012-04-08: 500 [IU]
  Filled 2012-04-08: qty 5

## 2012-04-08 MED ORDER — SODIUM CHLORIDE 0.9 % IV SOLN
Freq: Once | INTRAVENOUS | Status: AC
  Administered 2012-04-08: 10:00:00 via INTRAVENOUS

## 2012-04-08 NOTE — Patient Instructions (Signed)
Bevacizumab injection What is this medicine? BEVACIZUMAB (be va SIZ yoo mab) is a chemotherapy drug. It targets a protein found in many cancer cell types, and halts cancer growth. This drug treats many cancers including non-small cell lung cancer, and colon or rectal cancer. It is usually given with other chemotherapy drugs. This medicine may be used for other purposes; ask your health care provider or pharmacist if you have questions. What should I tell my health care provider before I take this medicine? They need to know if you have any of these conditions: -blood clots -heart disease, including heart failure, heart attack, or chest pain (angina) -high blood pressure -infection (especially a virus infection such as chickenpox, cold sores, or herpes) -kidney disease -lung disease -prior chemotherapy with doxorubicin, daunorubicin, epirubicin, or other anthracycline type chemotherapy agents -recent or ongoing radiation therapy -recent surgery -stroke -an unusual or allergic reaction to bevacizumab, hamster proteins, mouse proteins, other medicines, foods, dyes, or preservatives -pregnant or trying to get pregnant -breast-feeding How should I use this medicine? This medicine is for infusion into a vein. It is given by a health care professional in a hospital or clinic setting. Talk to your pediatrician regarding the use of this medicine in children. Special care may be needed. Overdosage: If you think you have taken too much of this medicine contact a poison control center or emergency room at once. NOTE: This medicine is only for you. Do not share this medicine with others. What if I miss a dose? It is important not to miss your dose. Call your doctor or health care professional if you are unable to keep an appointment. What may interact with this medicine? Interactions are not expected. This list may not describe all possible interactions. Give your health care provider a list of all  the medicines, herbs, non-prescription drugs, or dietary supplements you use. Also tell them if you smoke, drink alcohol, or use illegal drugs. Some items may interact with your medicine. What should I watch for while using this medicine? Your condition will be monitored carefully while you are receiving this medicine. You will need important blood work and urine testing done while you are taking this medicine. During your treatment, let your health care professional know if you have any unusual symptoms, such as difficulty breathing. This medicine may rarely cause 'gastrointestinal perforation' (holes in the stomach, intestines or colon), a serious side effect requiring surgery to repair. This medicine should be started at least 28 days following major surgery and the site of the surgery should be totally healed. Check with your doctor before scheduling dental work or surgery while you are receiving this treatment. Talk to your doctor if you have recently had surgery or if you have a wound that has not healed. Do not become pregnant while taking this medicine. Women should inform their doctor if they wish to become pregnant or think they might be pregnant. There is a potential for serious side effects to an unborn child. Talk to your health care professional or pharmacist for more information. Do not breast-feed an infant while taking this medicine. This medicine has caused ovarian failure in some women. This medicine may interfere with the ability to have a child. You should talk to your doctor or health care professional if you are concerned about your fertility. What side effects may I notice from receiving this medicine? Side effects that you should report to your doctor or health care professional as soon as possible: -allergic reactions like skin   rash, itching or hives, swelling of the face, lips, or tongue -signs of infection - fever or chills, cough, sore throat, pain or trouble passing  urine -signs of decreased platelets or bleeding - bruising, pinpoint red spots on the skin, black, tarry stools, nosebleeds, blood in the urine -breathing problems -changes in vision -chest pain -confusion -jaw pain, especially after dental work -mouth sores -seizures -severe abdominal pain -severe headache -sudden numbness or weakness of the face, arm or leg -swelling of legs or ankles -symptoms of a stroke: change in mental awareness, inability to talk or move one side of the body (especially in patients with lung cancer) -trouble passing urine or change in the amount of urine -trouble speaking or understanding -trouble walking, dizziness, loss of balance or coordination Side effects that usually do not require medical attention (report to your doctor or health care professional if they continue or are bothersome): -constipation -diarrhea -dry skin -headache -loss of appetite -nausea, vomiting This list may not describe all possible side effects. Call your doctor for medical advice about side effects. You may report side effects to FDA at 1-800-FDA-1088. Where should I keep my medicine? This drug is given in a hospital or clinic and will not be stored at home. NOTE: This sheet is a summary. It may not cover all possible information. If you have questions about this medicine, talk to your doctor, pharmacist, or health care provider.  2012, [employer]. (06/29/2010 4:25:37 PM) 

## 2012-04-08 NOTE — Progress Notes (Signed)
Diagnosis: Metastatic cholangiocarcinoma.    Current therapy: #1.  Status post 7 cycles of FOLFOX.  Patient was given a chemotherapy holiday, do to increased toxicity. #2 patient has now started Tarceva/Avastin.  Tarceva is given at 150 mg a day, and Avastin is every 2 weeks.  She is here for cycle 2 of Avastin  Interim history: [name] presents today for an office followup visit.  Her husband accompanies her.  Overall, she, reports, that she still in relatively well.  Back on 03/05/2012.  She did have a right rib cage nodule removed by Dr. Abbey Chatters.  The pathology report did reveal metastatic adenocarcinoma.  It was consistent with her cholangiocarcinoma.  She did have a followup CT scan.  On 03/18/2012.  This did show progression of her lung disease.  She also had an increase in her lung nodules.  There was no obvious disease below the diaphragm.  She is status post 7 cycles of FOLFOX.  She has been off of FOLFOX.  For over 2 months, now.  We did have to give her a chemotherapy holiday due to increased toxicity.  She did have pretty severe bone marrow suppression.  We decided to go ahead and place her on targeted therapy with with Avastin/Tarceva.  She has been on to receive a for a little over a week.  Now, and her first cycle of Avastin was on 03/27/2012.  Overall, she does not report any major toxicities.  She did have a couple bouts of diarrhea, however, utilized Imodium, which resolved the diarrhea.  She has lost about 4 pounds over the past couple weeks.  She reports, that her appetite is moderate, however, she has had to decrease the amount of food intake.  I advised her to have small meals more frequently throughout the day and supplement with either boost, ensure or Carnation instant breakfast.  She denies any nausea, vomiting, chest pain, shortness of breath, obvious, bleeding, cough, fevers, chills, night sweats.  She denies any mucositis, odynophagia or dysphasia.  She denies any headaches,  blurry vision.  She denies any type of rashes or skin problems related to the Tarceva.  She is not having any pain issues.  Review of Systems: Pt. Denies any changes in their vision, hearing, adenopathy, fevers, chills, nausea, vomiting, diarrhea, constipation, chest pain, shortness of breath, passing blood, passing out, blacking out,  any changes in skin, joints, neurologic or psychiatric except as noted.  Physical Exam: This is a pleasant, fairly well developed, well nourished, 61 year old, African American, female, in no obvious distress Vitals: Temperature 90.6 degrees, pulse 57, respirations 18, blood pressure 141/83, weight 154 pounds HEENT reveals a normocephalic, atraumatic skull, no scleral icterus, no oral lesions  Neck is supple without any cervical or supraclavicular adenopathy.  Lungs are clear to auscultation bilaterally. There are no wheezes, rales or rhonci Cardiac is regular rate and rhythm with a normal S1 and S2. There are no murmurs, rubs, or bruits.  Abdomen is soft with good bowel sounds, there is no palpable mass. There is no palpable hepatosplenomegaly. There is no palpable fluid wave.  Musculoskeletal no tenderness of the spine, ribs, or hips.  Extremities there are no clubbing, cyanosis, or edema.  Skin no petechia, purpura or ecchymosis Neurologic is nonfocal. Back: Excisional biopsy scar on the right lateral rib cage.  Laboratory Data: White count 4.6, hemoglobin 10.0, hematocrit 30.0, platelets 69,000  Current Outpatient Prescriptions on File Prior to Visit  Medication Sig Dispense Refill  . ALPHA LIPOIC ACID  PO Take 1 capsule by mouth every morning.       . B Complex Vitamins (VITAMIN B COMPLEX IJ) Inject as directed every morning.      Marland Kitchen dexamethasone (DECADRON) 4 MG tablet Take 4-8 mg by mouth daily with breakfast. Take 2 tabs by mouth days 1 and 3 of chemo cycle, then 1 tab by mouth days 4 and 5 of each cycle of chemo      . erlotinib (TARCEVA) 150 MG tablet  Take 1 tablet (150 mg total) by mouth daily. Take on an empty stomach 1 hour before meals or 2 hours after.  28 tablet  4  . folic acid (FOLVITE) 1 MG tablet Take 1 tablet (1 mg total) by mouth daily.  30 tablet  0  . gabapentin (NEURONTIN) 300 MG capsule Take 2 capsules (600 mg total) by mouth 3 (three) times daily.  180 capsule  3  . lidocaine-prilocaine (EMLA) cream Apply 1 application topically once. Apply one hour before chemo.      . metoCLOPramide (REGLAN) 10 MG tablet Take 10 mg by mouth every 6 (six) hours as needed. For nausea and vomiting      . metoprolol (TOPROL-XL) 100 MG 24 hr tablet Take 1 tablet (100 mg total) by mouth daily.  90 tablet  3  . nitroGLYCERIN (NITROSTAT) 0.4 MG SL tablet Place 0.4 mg under the tongue every 5 (five) minutes as needed. For chest pain      . Omeprazole 20 MG TBEC Take 1 tablet (20 mg total) by mouth 2 (two) times daily.  180 each  3  . oxycodone (OXY-IR) 5 MG capsule Take 5 mg by mouth every 6 (six) hours as needed. For pain      . simvastatin (ZOCOR) 40 MG tablet Take 1 tablet (40 mg total) by mouth at bedtime.  31 tablet  1  . sucralfate (CARAFATE) 1 GM/10ML suspension Take 1 g by mouth every 6 (six) hours.       Assessment/Plan: This is a pleasant, 61 year old, African American, female, with the following issues: #1.  Metastatic cholangiocarcinoma-she is status post 7 cycles of FOLFOX.  She was having increased toxicity related to this and we gave her a chemotherapy holiday period.  We have now placed her on Avastin/Tarceva, which she is tolerating relatively well so far.  She will go ahead and continue with the Tarceva 150 mg daily and will go ahead and receive cycle 2 of Avastin today, which she gets every 2 weeks.  #2 cytopenias-this is secondary to chemotherapy.  She is currently asymptomatic.  However, we will continue to monitor her counts closely.  #3 followup-[name] will follow back up with [place] in 2 weeks, but before then should there be  questions or concerns.

## 2012-04-09 ENCOUNTER — Encounter: Payer: Self-pay | Admitting: Internal Medicine

## 2012-04-09 ENCOUNTER — Ambulatory Visit (INDEPENDENT_AMBULATORY_CARE_PROVIDER_SITE_OTHER): Payer: Medicare Other | Admitting: Internal Medicine

## 2012-04-09 VITALS — BP 157/103 | HR 56 | Temp 97.7°F | Ht 63.0 in | Wt 153.2 lb

## 2012-04-09 DIAGNOSIS — C7800 Secondary malignant neoplasm of unspecified lung: Secondary | ICD-10-CM

## 2012-04-09 DIAGNOSIS — C221 Intrahepatic bile duct carcinoma: Secondary | ICD-10-CM

## 2012-04-09 DIAGNOSIS — I10 Essential (primary) hypertension: Secondary | ICD-10-CM

## 2012-04-09 DIAGNOSIS — E785 Hyperlipidemia, unspecified: Secondary | ICD-10-CM

## 2012-04-09 DIAGNOSIS — K219 Gastro-esophageal reflux disease without esophagitis: Secondary | ICD-10-CM

## 2012-04-09 DIAGNOSIS — C801 Malignant (primary) neoplasm, unspecified: Secondary | ICD-10-CM

## 2012-04-09 DIAGNOSIS — D6481 Anemia due to antineoplastic chemotherapy: Secondary | ICD-10-CM

## 2012-04-09 DIAGNOSIS — T451X5A Adverse effect of antineoplastic and immunosuppressive drugs, initial encounter: Secondary | ICD-10-CM

## 2012-04-09 MED ORDER — OXYCODONE HCL 5 MG PO CAPS: 5.0000 mg | ORAL_CAPSULE | Freq: Four times a day (QID) | ORAL | Status: DC | PRN

## 2012-04-09 MED ORDER — OMEPRAZOLE 20 MG PO TBEC: 1.0000 | DELAYED_RELEASE_TABLET | Freq: Two times a day (BID) | ORAL | Status: DC

## 2012-04-09 MED ORDER — SIMVASTATIN 40 MG PO TABS: 40.0000 mg | ORAL_TABLET | Freq: Every day | ORAL | Status: DC

## 2012-04-09 MED ORDER — GABAPENTIN 300 MG PO CAPS: 600.0000 mg | ORAL_CAPSULE | Freq: Three times a day (TID) | ORAL | Status: DC

## 2012-04-09 MED ORDER — METOPROLOL SUCCINATE ER 100 MG PO TB24: 100.0000 mg | ORAL_TABLET | Freq: Every day | ORAL | Status: DC

## 2012-04-09 NOTE — Assessment & Plan Note (Signed)
Patient had blood pressure of 144/100 at today's visit. She had not taken her blood pressure medication yet this morning. Will not make medication adjustment at today's visit. She is not having orthostasis or dizziness. Pulse is 56 at today's visit.

## 2012-04-09 NOTE — Assessment & Plan Note (Addendum)
Patient continues to take simvastatin daily. If patient prognosis is better at next visit will consider checking lipids.

## 2012-04-09 NOTE — Progress Notes (Signed)
Subjective:     Patient ID: [name], female   DOB: 1951/07/19, 61 y.o.   MRN: [id number]  HPI This is a very pleasant 61 year old lady who is a patient of mine. She does come in today for routine checkup. She does have history of cholangiocarcinoma which was removed via surgery. She has been undergoing multiple cycles of chemotherapy since that time which have been delayed by myelosuppression. She is currently on a new targeted therapy regimen. She does have metastatic disease at this time with lung lesions and a chest wall lesion that was recently removed. I did speak with her about her knowledge about the possible outcomes and she does seem to realize that they may not be trying to cure her at this time however are trying to stop progression of certain lesions. She is feeling fairly well at today's visit and has not had any side effects from this new chemotherapy which she started several weeks ago. She's not had any fevers or chills at home. She states that if she eats too much at a time she gets nauseous so has been eating smaller meals and is trying to supplement with nutritional supplements. She states that she occasionally gets pain from time to time however her pain medication does seem to relieve the pain. She does have a lot of family that lives close by in the area and had been a huge support for her. She is able to get around with a cane but occasionally just doesn't have the energy to get around at all. She hasn't fallen at home recently. She doesn't have any new back pain or focal sites of pain. She does continue to follow with oncology closely.  Review of Systems  Constitutional: Positive for activity change, appetite change and unexpected weight change. Negative for fever, chills, diaphoresis and fatigue.       Patient has lost about 5 pounds in the last 2 weeks and is unable to do as much around the home.  HENT: Negative.   Eyes: Negative.   Respiratory: Negative for cough, choking,  chest tightness, shortness of breath and wheezing.   Cardiovascular: Positive for chest pain. Negative for palpitations and leg swelling.       From site of surgery, much better since removal of cancerous lesion.  Gastrointestinal: Positive for nausea and abdominal pain. Negative for vomiting, diarrhea, constipation, blood in stool, abdominal distention, anal bleeding and rectal pain.  Musculoskeletal: Positive for myalgias and gait problem. Negative for back pain, joint swelling and arthralgias.  Skin: Negative.   Neurological: Positive for weakness. Negative for dizziness, tremors, seizures, syncope, facial asymmetry, speech difficulty, light-headedness, numbness and headaches.       Objective:   Physical Exam  Constitutional: She is oriented to person, place, and time. She appears well-developed and well-nourished. No distress.  HENT:  Head: Normocephalic and atraumatic.  Eyes: EOM are normal. Pupils are equal, round, and reactive to light.  Neck: Normal range of motion. Neck supple.  Cardiovascular: Normal rate and regular rhythm.   Pulmonary/Chest: Effort normal and breath sounds normal. No respiratory distress. She has no wheezes. She has no rales. She exhibits tenderness.  Abdominal: Bowel sounds are normal. She exhibits no distension. There is tenderness. There is no rebound and no guarding.  Musculoskeletal: Normal range of motion. She exhibits tenderness. She exhibits no edema.  Neurological: She is alert and oriented to person, place, and time. No cranial nerve deficit.  Skin: Skin is warm and dry. She is  not diaphoretic.       Assessment/Plan:   1. Please see problem oriented charting.  2. Disposition-the patient will be seen back when necessary. Advised her that she should make oncology visits and that if coming to see me in addition was too much she could forego that for a period of time. Advised her that she could be seen whenever she wants. If she wants to come in and  talk that would be fine. If she is having more pain, weakness, or has medication side effects she should definitely come in to see [place]. Advised her that if there's anything we can do to support her just to let us know and we'll be there. Medications refilled at today's visit.

## 2012-04-09 NOTE — Assessment & Plan Note (Addendum)
The patient does have metastatic disease to the lung and chest wall. She is currently undergoing Tarceva in addition to Avastin for targeted treatment therapy. She does also have progression of lesions in the lung per last CT scan. Next CT scan planned in 2 months. She is having a little bit of nausea which is causing her to be able to eat only limited amounts of food. She is having about 5 pound weight loss in the last 2 weeks. Encouraged fluid intake and nutritional supplement intake. She is not having any new focal signs or sources of pain. She states her pain is fairly well-controlled.

## 2012-04-09 NOTE — Patient Instructions (Signed)
We will refill your medicines today and give them to you to take. If you need anything call our office. Come back when you are able to come and want to see the doctor. Our number is [phone].

## 2012-04-09 NOTE — Assessment & Plan Note (Signed)
The patient does have myelosuppression resulting from chemotherapy and is monitored very closely by oncology. She does take folic acid daily. No signs of fevers, chills.

## 2012-04-22 ENCOUNTER — Ambulatory Visit (HOSPITAL_BASED_OUTPATIENT_CLINIC_OR_DEPARTMENT_OTHER): Payer: Medicare Other | Admitting: Medical

## 2012-04-22 VITALS — BP 148/86 | HR 56 | Temp 98.5°F | Resp 18 | Ht 63.0 in | Wt 156.0 lb

## 2012-04-22 DIAGNOSIS — C221 Intrahepatic bile duct carcinoma: Secondary | ICD-10-CM

## 2012-04-22 DIAGNOSIS — R6889 Other general symptoms and signs: Secondary | ICD-10-CM

## 2012-04-22 DIAGNOSIS — C7800 Secondary malignant neoplasm of unspecified lung: Secondary | ICD-10-CM

## 2012-04-22 NOTE — Progress Notes (Signed)
Diagnosis: Metastatic cholangiocarcinoma.    Current therapy: #1. Status post 7 cycles of FOLFOX.  Patient was given a chemotherapy holiday, do to increased toxicity. #2 patient has now started Tarceva/Avastin.  Tarceva is given at 150 mg a day, and Avastin is every 2 weeks.  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  [name] states that for the past week she has noticed an elevated knot on the back of her head.  She states this has progressively gotten worse.  It is extremely tender, and she is unable to sleep on it.  She also states, that she has a significant amount of pressure in the neck area that is associated with it.  She denies any type of headaches or blurry vision.  She denies any difficulty with speech.  She denies any fevers.  She denies any type of gait disturbances. Back on 03/05/2012.  She did have a right rib cage nodule removed by Dr. Abbey Chatters.  The pathology report did reveal metastatic adenocarcinoma.  It was consistent with her cholangiocarcinoma.  She did have a followup CT scan.  On 03/18/2012.  This did show progression of her lung disease.  She also had an increase in her lung nodules.  There was no obvious disease below the diaphragm.  She is status post 7 cycles of FOLFOX.  She has been off of FOLFOX.  For over 2 months, now.  We did have to give her a chemotherapy holiday due to increased toxicity.  She did have pretty severe bone marrow suppression.  We decided to go ahead and place her on targeted therapy with with Avastin/Tarceva.   Overall, she does not report any major toxicities.  She did have a couple bouts of diarrhea, however, utilized Imodium, which resolved the diarrhea.  She has lost about 4 pounds over the past few weeks.  She reports, that her appetite is moderate, however, she has had to decrease the amount of food intake.  I advised her to have small meals more frequently throughout the day and supplement  with either boost, ensure or Carnation instant breakfast.  She denies any nausea, vomiting, chest pain, shortness of breath, obvious, bleeding, cough, fevers, chills, night sweats.  She denies any mucositis, odynophagia or dysphasia.  She denies any headaches, blurry vision.  She denies any type of rashes or skin problems related to the Tarceva.  She is not having any pain issues. I thank for right now, we will go ahead and send her to see Dr. Abbey Chatters to evaluate the knot on the back of her head.  I would rather be safe than sorry and a rule out any type of metastatic disease.  Review of Systems: Knot on back of head x 1 week that is tender, otherwise: Pt. Denies any changes in their vision, hearing, adenopathy, fevers, chills, nausea, vomiting, diarrhea, constipation, chest pain, shortness of breath, passing blood, passing out, blacking out,  any changes in skin, joints, neurologic or psychiatric except as noted.  Physical Exam: This is a pleasant, fairly well developed, well nourished, 61 year old, African American, female, in no obvious distress Vitals: Temperature 98.5, pulse 56, respirations 18, blood pressure 148/86, and weight 156 pounds HEENT reveals a normocephalic, atraumatic skull, no scleral icterus, no oral lesions  Neck is supple without any cervical or supraclavicular adenopathy.  Lungs are clear to auscultation bilaterally. There are no wheezes, rales or rhonci Cardiac is regular rate and rhythm  with a normal S1 and S2. There are no murmurs, rubs, or bruits.  Abdomen is soft with good bowel sounds, there is no palpable mass. There is no palpable hepatosplenomegaly. There is no palpable fluid wave.  Musculoskeletal no tenderness of the spine, ribs, or hips.  Extremities there are no clubbing, cyanosis, or edema.  Skin no petechia, purpura or ecchymosis-patient does have about a 2 cm x 2 cm knot on the back of her head that is tender to palpation with erythema. Neurologic is  nonfocal. Back: Excisional biopsy scar on the right lateral rib cage.   Current Outpatient Prescriptions on File Prior to Visit  Medication Sig Dispense Refill  . ALPHA LIPOIC ACID PO Take 1 capsule by mouth every morning.       . B Complex Vitamins (VITAMIN B COMPLEX IJ) Inject as directed every morning.      Marland Kitchen dexamethasone (DECADRON) 4 MG tablet Take 4-8 mg by mouth daily with breakfast. Take 2 tabs by mouth days 1 and 3 of chemo cycle, then 1 tab by mouth days 4 and 5 of each cycle of chemo      . erlotinib (TARCEVA) 150 MG tablet Take 1 tablet (150 mg total) by mouth daily. Take on an empty stomach 1 hour before meals or 2 hours after.  28 tablet  4  . folic acid (FOLVITE) 1 MG tablet Take 1 tablet (1 mg total) by mouth daily.  30 tablet  0  . gabapentin (NEURONTIN) 300 MG capsule Take 2 capsules (600 mg total) by mouth 3 (three) times daily.  180 capsule  3  . lidocaine-prilocaine (EMLA) cream Apply 1 application topically once. Apply one hour before chemo.      . metoCLOPramide (REGLAN) 10 MG tablet Take 10 mg by mouth every 6 (six) hours as needed. For nausea and vomiting      . metoprolol succinate (TOPROL-XL) 100 MG 24 hr tablet Take 1 tablet (100 mg total) by mouth daily.  90 tablet  3  . nitroGLYCERIN (NITROSTAT) 0.4 MG SL tablet Place 0.4 mg under the tongue every 5 (five) minutes as needed. For chest pain      . Omeprazole 20 MG TBEC Take 1 tablet (20 mg total) by mouth 2 (two) times daily.  180 each  3  . oxycodone (OXY-IR) 5 MG capsule Take 1 capsule (5 mg total) by mouth every 6 (six) hours as needed. For pain  30 capsule  0  . simvastatin (ZOCOR) 40 MG tablet Take 1 tablet (40 mg total) by mouth at bedtime.  31 tablet  1  . sucralfate (CARAFATE) 1 GM/10ML suspension Take 1 g by mouth every 6 (six) hours.       Assessment/Plan: This is a pleasant, 61 year old, African American, female, with the following issues:  #1.  Metastatic cholangiocarcinoma-she is status post 7 cycles  of FOLFOX.  She was having increased toxicity related to this and we gave her a chemotherapy holiday period.  We have now placed her on Avastin/Tarceva, which she is tolerating relatively well so far.  She will go ahead and continue with the Tarceva 150 mg daily and will go ahead and receive her third cycle of Avastin this coming Friday.  #2 .  Elevated knot to originating from the posterior scalp.  We will go ahead and get her over to see Dr. Abbey Chatters for evaluation to make sure this is not metastatic disease.  #3 followup-[name] will follow back up with [place] in 2  weeks, but before then should there be questions or concerns.

## 2012-04-23 ENCOUNTER — Ambulatory Visit (INDEPENDENT_AMBULATORY_CARE_PROVIDER_SITE_OTHER): Payer: Medicare Other | Admitting: General Surgery

## 2012-04-23 DIAGNOSIS — L02818 Cutaneous abscess of other sites: Secondary | ICD-10-CM

## 2012-04-23 DIAGNOSIS — L02811 Cutaneous abscess of head [any part, except face]: Secondary | ICD-10-CM | POA: Insufficient documentation

## 2012-04-23 MED ORDER — DOXYCYCLINE HYCLATE 100 MG PO TABS: 100.0000 mg | ORAL_TABLET | Freq: Two times a day (BID) | ORAL | Status: AC

## 2012-04-23 NOTE — Patient Instructions (Signed)
Place ice on area with pressure for one hour. Remove bandage and packing in 2 days then cleaned wound with warm water and apply a dry dressing 3 times a day. Call for heavy bleeding.

## 2012-04-23 NOTE — Progress Notes (Signed)
Patient ID: [name], female   DOB: 09/20/1950, 61 y.o.   MRN: [id number]  No chief complaint on file.   HPI [name] is a 61 y.o. female.   HPI  She is referred by Dr. Myna Hidalgo for evaluation of a painful scalp mass. She has metastatic cholangiocarcinoma. She's been treated with Avastin and Tarceva. She notices painful scalp mass a couple days ago that is getting larger. She's not had any fever but has had some chills. No drainage from the mass. The pain runs down to the back of her neck area.  Past Medical History  Diagnosis Date  . Hypertension   . Hyperlipidemia   . GERD (gastroesophageal reflux disease)   . Depression   . Esophageal stricture 2003     [place] post esophageal dilation in 2003 performed by Dr. Marina Goodell, second esophageal dilation performed in 2006  by Dr. Marina Goodell  . Obstructive jaundice due to malignant neoplasm  February 2012     status post ERCP done by Dr. Marina Goodell with biliary sphincterotomy, bile duct stricture brushings, biliary stent placement, performed secondary to obstructive jaundice, malignant-appearing bile duct stricture of the common hepatic duct worrisome for primary cholangiocarcinoma, status post ERCP with biliary sphincterotomy and biliary stent placement...status post biopsy -  markedly atypical cells cons  . Rotator cuff syndrome 2010     per MRI March 2010 there is a prominent articular surface partial tear of the supraspinatus which is nearly full thickness, as well as a tear of the distal anterior supraspinatus which probably represents a full-thickness partial tear,  with moderate supraspinatus tendinopathy, moderate infraspinatus tendinopathy and prominent undersurface tear of the acromion along the coracoacromial ligament  . Hiatal hernia   . SOB (shortness of breath) on exertion   . Bile duct adenocarcinoma   . Myocardial infarction   . Angina   . Headache   . Arthritis   . Dysrhythmia   . Peripheral vascular disease   . Anemia   . Diarrhea    . Nausea   . Cholangiocarcinoma   . Complication of anesthesia     Past Surgical History  Procedure Date  . Cholecystectomy  2007  .  aortogram   August 2011     ultrasound-guided access to the right common femoral artery, an aortogram with bilateral iliac arteriogram and bilateral lower extremity runoff, this was performed secondary to progressive bilateral lower extremity claudication, left common iliac artery occlusion as well as short segment of right superficial femoral artery occlusion, procedure performed by Dr. Durwin Nora  . Bypass graft  January 2009     of the infected femoral-femoral graft with vein patch angioplasty of bilateral femoral artery, 7 by [name],   .  abdominal aortic angiography  September 2008     performed by Dr. 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 Cardiac catheterization  August 2011     patent coronary arteries with diffuse nonobstructive disease, there are no areas of high grade stenosis present,  determined the chest pain is most likely noncardiac in origin, continue medical management.  . Bile duct surgery     60% of liver removed  . Arthroscopy knee w/ drilling   . Esophagogastroduodenoscopy 07/10/2011    Procedure: ESOPHAGOGASTRODUODENOSCOPY (EGD);  Surgeon: Yancey Flemings, MD;  Location: [facility];  Service: Endoscopy;  Laterality: N/A;  . Liver  resection     section of liver resceted due to bile duct ca  . Esophagogastroduodenoscopy 07/20/2011    Procedure: ESOPHAGOGASTRODUODENOSCOPY (EGD);  Surgeon: Erick Blinks, MD;  Location: [facility];  Service: Gastroenterology;  Laterality: N/A;  . Esophagogastroduodenoscopy 07/31/2011    Procedure: ESOPHAGOGASTRODUODENOSCOPY (EGD);  Surgeon: Yancey Flemings, MD;  Location: [facility];  Service: Endoscopy;  Laterality: N/A;  .  Mass excision 03/05/2012    Procedure: EXCISION MASS;  Surgeon: Adolph Pollack, MD;  Location: [facility] OR;  Service: General;  Laterality: N/A;  Right chest wall    Family History  Problem Relation Age of Onset  . Heart disease Brother     Social History History  Substance Use Topics  . Smoking status: Former Smoker    Quit date: 06/14/1999  . Smokeless tobacco: Never Used  . Alcohol Use: No    Allergies  Allergen Reactions  . Promethazine Hcl Anxiety and Other (See Comments)    Change in LOC  . Sulfonamide Derivatives Hives    All over the body    Current Outpatient Prescriptions  Medication Sig Dispense Refill  . ALPHA LIPOIC ACID PO Take 1 capsule by mouth every morning.       . B Complex Vitamins (VITAMIN B COMPLEX IJ) Inject as directed every morning.      Marland Kitchen dexamethasone (DECADRON) 4 MG tablet Take 4-8 mg by mouth daily with breakfast. Take 2 tabs by mouth days 1 and 3 of chemo cycle, then 1 tab by mouth days 4 and 5 of each cycle of chemo      . erlotinib (TARCEVA) 150 MG tablet Take 1 tablet (150 mg total) by mouth daily. Take on an empty stomach 1 hour before meals or 2 hours after.  28 tablet  4  . folic acid (FOLVITE) 1 MG tablet Take 1 tablet (1 mg total) by mouth daily.  30 tablet  0  . gabapentin (NEURONTIN) 300 MG capsule Take 2 capsules (600 mg total) by mouth 3 (three) times daily.  180 capsule  3  . lidocaine-prilocaine (EMLA) cream Apply 1 application topically once. Apply one hour before chemo.      . metoCLOPramide (REGLAN) 10 MG tablet Take 10 mg by mouth every 6 (six) hours as needed. For nausea and vomiting      . metoprolol succinate (TOPROL-XL) 100 MG 24 hr tablet Take 1 tablet (100 mg total) by mouth daily.  90 tablet  3  . nitroGLYCERIN (NITROSTAT) 0.4 MG SL tablet Place 0.4 mg under the tongue every 5 (five) minutes as needed. For chest pain      . Omeprazole 20 MG TBEC Take 1 tablet (20 mg total) by mouth 2 (two) times daily.  180 each  3  . oxycodone  (OXY-IR) 5 MG capsule Take 1 capsule (5 mg total) by mouth every 6 (six) hours as needed. For pain  30 capsule  0  . simvastatin (ZOCOR) 40 MG tablet Take 1 tablet (40 mg total) by mouth at bedtime.  31 tablet  1  . sucralfate (CARAFATE) 1 GM/10ML suspension Take 1 g by mouth every 6 (six) hours.        Review of Systems Review of Systems  Constitutional: Positive for chills. Negative for fever.  Neurological: Positive for headaches.    There were no vitals taken for this visit.  Physical Exam Physical Exam  Constitutional:       Uncomfortable appearing female  HENT:       3  cm fluctuant, tender, erythematous mass in the left occipital scalp area.  Neck: Neck supple.  Lymphadenopathy:    She has no cervical adenopathy.    Data Reviewed Cancer center note.  Assessment    Infected pilar cyst/scalp abscess   Plan    Careful incision and drainage (she is on Avastin). Oral antibiotics. Wound care instructions were given. Return visit in 3 weeks.  Procedure: The hair around the abscess was shaved and the area sterilely prepped with Betadine. The area was anesthetized with 1% Xylocaine with epinephrine. A small cruciate incision was made in the abscess and purulent and caseous material was drained. Some of the cyst capsule was removed bluntly. The wound was then packed tightly with quarter inch iodoform gauze. A bulky dry dressing was applied. She tolerated the procedure well and had immediate relief of her pain. There were no apparent complications.       Joselin Crandell J 04/23/2012, 2:03 PM

## 2012-04-24 ENCOUNTER — Other Ambulatory Visit (HOSPITAL_BASED_OUTPATIENT_CLINIC_OR_DEPARTMENT_OTHER): Payer: Medicare Other | Admitting: Lab

## 2012-04-24 ENCOUNTER — Ambulatory Visit (HOSPITAL_BASED_OUTPATIENT_CLINIC_OR_DEPARTMENT_OTHER): Payer: Medicare Other | Admitting: Hematology & Oncology

## 2012-04-24 ENCOUNTER — Ambulatory Visit: Payer: Medicare Other

## 2012-04-24 VITALS — BP 141/85 | HR 52 | Temp 98.4°F | Resp 18 | Ht 63.0 in | Wt 155.0 lb

## 2012-04-24 DIAGNOSIS — C221 Intrahepatic bile duct carcinoma: Secondary | ICD-10-CM

## 2012-04-24 DIAGNOSIS — C50919 Malignant neoplasm of unspecified site of unspecified female breast: Secondary | ICD-10-CM

## 2012-04-24 DIAGNOSIS — C7800 Secondary malignant neoplasm of unspecified lung: Secondary | ICD-10-CM

## 2012-04-24 LAB — CBC WITH DIFFERENTIAL (CANCER CENTER ONLY)
BASO%: 0.2 % (ref 0.0–2.0)
EOS%: 1.4 % (ref 0.0–7.0)
HCT: 30.4 % — ABNORMAL LOW (ref 34.8–46.6)
LYMPH#: 0.5 10*3/uL — ABNORMAL LOW (ref 0.9–3.3)
LYMPH%: 10.7 % — ABNORMAL LOW (ref 14.0–48.0)
MCHC: 32.9 g/dL (ref 32.0–36.0)
MCV: 97 fL (ref 81–101)
NEUT%: 76.5 % (ref 39.6–80.0)
Platelets: 71 10*3/uL — ABNORMAL LOW (ref 145–400)
RDW: 13.8 % (ref 11.1–15.7)

## 2012-04-24 LAB — COMPREHENSIVE METABOLIC PANEL
ALT: 17 U/L (ref 0–35)
AST: 22 U/L (ref 0–37)
Creatinine, Ser: 1.05 mg/dL (ref 0.50–1.10)
Sodium: 140 mEq/L (ref 135–145)
Total Bilirubin: 0.7 mg/dL (ref 0.3–1.2)
Total Protein: 5.9 g/dL — ABNORMAL LOW (ref 6.0–8.3)

## 2012-04-24 MED ORDER — SODIUM CHLORIDE 0.9 % IJ SOLN
10.0000 mL | INTRAMUSCULAR | Status: DC | PRN
  Administered 2012-04-24: 10 mL via INTRAVENOUS
  Filled 2012-04-24: qty 10

## 2012-04-24 MED ORDER — HEPARIN SOD (PORK) LOCK FLUSH 100 UNIT/ML IV SOLN
500.0000 [IU] | Freq: Once | INTRAVENOUS | Status: AC
  Administered 2012-04-24: 500 [IU] via INTRAVENOUS
  Filled 2012-04-24: qty 5

## 2012-04-24 NOTE — Progress Notes (Signed)
This office note has been dictated.

## 2012-04-24 NOTE — Progress Notes (Signed)
DIAGNOSIS:  Metastatic cholangiocarcinoma.  CURRENT THERAPY: 1. Avastin every 2 weeks. 2. Tarceva 150 mg p.o. daily.  INTERIM HISTORY:  [name] comes in for followup.  She was supposed to get her Avastin today.  Unfortunately, she developed a cyst on the back of her head.  This had to drained by Dr. Abbey Chatters yesterday.  This was a fairly large cyst.  He drained it thoroughly.  She does have a gauze that needs to be changed.  We are going to hold off on her Avastin.  I think we probably need to hold off on the Avastin for about 3 weeks or so.  Even though the incision is not that large, I want to make sure that she heals up.  She feels a whole lot better now that that cyst was opened up and drained.  She was having quite a bit of pain in the back of her head. She was not able to sleep.  She says she slept very well last night.  Otherwise, she has done quite well.  She has tolerated her treatment nicely.  She has had no abdominal pain.  Her appetite has been okay.  We have been able to keep her off chemo to try to improve her blood counts.  PHYSICAL EXAMINATION:  General:  Shows is a well-developed, well- nourished black female in no obvious distress.  Vital Signs:  Show a temperature of 98.4, pulse 52, respiratory rate 18, blood pressure 141/85, weight is 155.  Head and Neck Exam:  Shows a normocephalic, atraumatic skull.  She does have a dressing on the back of her scalp. This is in the occipital region.  I took off the dressing.  She has packing within this opened cyst cavity.  She has no erythema about this. There is very little tenderness now.  Neck:  Supple.  No adenopathy. Lungs:  Clear bilaterally.  Cardiac Exam:  Regular rate and rhythm with a normal S1 and S2.  There are no murmurs, rubs, or bruits.  Abdominal Exam:  Soft with good bowel sounds.  There is no palpable abdominal mass.  There is no fluid wave.  She has a well-healed laparotomy scar. Extremities:  Show  no clubbing, cyanosis. Or edema.  LABORATORY STUDIES:  White cell count 4.2, hemoglobin 10, hematocrit 30.4, platelet count 71,000.  IMPRESSION:  [name] is a 61 year old black female with metastatic cholangiocarcinoma.  Again, we have her on treatments with Avastin/Tarceva.  I realize that this is not "main line therapy." However, because of her blood counts, I really have not been able to treat her with chemo.  I felt that with her performance status being as good as it is, that we had to treat because of her progressive disease.  I forgot to mention that the right thoracic wall where she had a subcutaneous met, this has healed nicely.  We will plan to get her back in another 3 weeks.  We will have her see [name]  We will go ahead and give her the Avastin at that point.  I likely will get her set up with followup scans probably at the end of October.    ______________________________ Josph Macho, M.D. PRE/MEDQ  D:  04/24/2012  T:  04/24/2012  Job:  [id number]

## 2012-04-24 NOTE — Patient Instructions (Signed)

## 2012-04-28 ENCOUNTER — Encounter: Payer: Self-pay | Admitting: Internal Medicine

## 2012-05-06 ENCOUNTER — Ambulatory Visit: Payer: Medicare Other | Admitting: Hematology & Oncology

## 2012-05-06 ENCOUNTER — Telehealth (INDEPENDENT_AMBULATORY_CARE_PROVIDER_SITE_OTHER): Payer: Self-pay

## 2012-05-06 ENCOUNTER — Other Ambulatory Visit: Payer: Medicare Other | Admitting: Lab

## 2012-05-06 ENCOUNTER — Ambulatory Visit: Payer: Medicare Other

## 2012-05-06 NOTE — Telephone Encounter (Signed)
Patient called in stating she had i&d of scalp cyst about 2 weeks ago. Area has scabbed over and she wanted to know if she could shampoo and condition her hair. I told her it was ok to proceed with washing her hair. Patient understood.

## 2012-05-08 ENCOUNTER — Telehealth: Payer: Self-pay | Admitting: Hematology & Oncology

## 2012-05-08 NOTE — Telephone Encounter (Signed)
Pt aware moved 10-3 to 10-2

## 2012-05-11 ENCOUNTER — Encounter (INDEPENDENT_AMBULATORY_CARE_PROVIDER_SITE_OTHER): Payer: Self-pay | Admitting: General Surgery

## 2012-05-11 ENCOUNTER — Ambulatory Visit (INDEPENDENT_AMBULATORY_CARE_PROVIDER_SITE_OTHER): Payer: Medicare Other | Admitting: General Surgery

## 2012-05-11 VITALS — BP 138/76 | HR 64 | Temp 97.4°F | Resp 16 | Ht 63.0 in | Wt 154.2 lb

## 2012-05-11 DIAGNOSIS — Z9889 Other specified postprocedural states: Secondary | ICD-10-CM

## 2012-05-11 NOTE — Progress Notes (Signed)
She is here for her first visit after drainage of a scalp abscess which is likely an infected pilar cyst. She is doing much better.  On exam the scalp abscess wound is healed well. There is a small scab present. No evidence of residual infection.  Assessment: Scalp abscess is resolved after incision and drainage.  Plan: Return when necessary

## 2012-05-11 NOTE — Patient Instructions (Signed)
Call if you have another problem like this.

## 2012-05-12 LAB — TYPE AND SCREEN
ABO/RH(D): A POS
Antibody Screen: NEGATIVE
Unit division: 0
Unit division: 0
Unit division: 0
Unit division: 0

## 2012-05-13 ENCOUNTER — Ambulatory Visit (HOSPITAL_BASED_OUTPATIENT_CLINIC_OR_DEPARTMENT_OTHER): Payer: Medicare Other | Admitting: Hematology & Oncology

## 2012-05-13 ENCOUNTER — Ambulatory Visit (HOSPITAL_BASED_OUTPATIENT_CLINIC_OR_DEPARTMENT_OTHER): Payer: Medicare Other

## 2012-05-13 ENCOUNTER — Other Ambulatory Visit (HOSPITAL_BASED_OUTPATIENT_CLINIC_OR_DEPARTMENT_OTHER): Payer: Medicare Other | Admitting: Lab

## 2012-05-13 VITALS — BP 161/99 | HR 58 | Temp 98.3°F | Resp 18 | Ht 63.0 in | Wt 153.0 lb

## 2012-05-13 VITALS — BP 172/88 | HR 55

## 2012-05-13 DIAGNOSIS — C221 Intrahepatic bile duct carcinoma: Secondary | ICD-10-CM

## 2012-05-13 DIAGNOSIS — C7800 Secondary malignant neoplasm of unspecified lung: Secondary | ICD-10-CM

## 2012-05-13 DIAGNOSIS — D696 Thrombocytopenia, unspecified: Secondary | ICD-10-CM

## 2012-05-13 DIAGNOSIS — Z5112 Encounter for antineoplastic immunotherapy: Secondary | ICD-10-CM

## 2012-05-13 LAB — COMPREHENSIVE METABOLIC PANEL WITH GFR
ALT: 18 U/L (ref 0–35)
AST: 26 U/L (ref 0–37)
Albumin: 3.3 g/dL — ABNORMAL LOW (ref 3.5–5.2)
Alkaline Phosphatase: 116 U/L (ref 39–117)
BUN: 26 mg/dL — ABNORMAL HIGH (ref 6–23)
CO2: 25 meq/L (ref 19–32)
Calcium: 9.1 mg/dL (ref 8.4–10.5)
Chloride: 107 meq/L (ref 96–112)
Creatinine, Ser: 1.01 mg/dL (ref 0.50–1.10)
Glucose, Bld: 71 mg/dL (ref 70–99)
Potassium: 3.8 meq/L (ref 3.5–5.3)
Sodium: 140 meq/L (ref 135–145)
Total Bilirubin: 0.6 mg/dL (ref 0.3–1.2)
Total Protein: 6 g/dL (ref 6.0–8.3)

## 2012-05-13 LAB — CBC WITH DIFFERENTIAL (CANCER CENTER ONLY)
BASO#: 0 10*3/uL (ref 0.0–0.2)
Eosinophils Absolute: 0.1 10*3/uL (ref 0.0–0.5)
HGB: 10.2 g/dL — ABNORMAL LOW (ref 11.6–15.9)
LYMPH#: 0.7 10*3/uL — ABNORMAL LOW (ref 0.9–3.3)
MONO%: 10.4 % (ref 0.0–13.0)
NEUT#: 3.4 10*3/uL (ref 1.5–6.5)
Platelets: 70 10*3/uL — ABNORMAL LOW (ref 145–400)
RBC: 3.2 10*6/uL — ABNORMAL LOW (ref 3.70–5.32)
WBC: 4.6 10*3/uL (ref 3.9–10.0)

## 2012-05-13 LAB — LACTATE DEHYDROGENASE: LDH: 135 U/L (ref 94–250)

## 2012-05-13 MED ORDER — SODIUM CHLORIDE 0.9 % IJ SOLN
10.0000 mL | INTRAMUSCULAR | Status: DC | PRN
  Administered 2012-05-13: 10 mL
  Filled 2012-05-13: qty 10

## 2012-05-13 MED ORDER — HEPARIN SOD (PORK) LOCK FLUSH 100 UNIT/ML IV SOLN
500.0000 [IU] | Freq: Once | INTRAVENOUS | Status: AC | PRN
  Administered 2012-05-13: 500 [IU]
  Filled 2012-05-13: qty 5

## 2012-05-13 MED ORDER — SODIUM CHLORIDE 0.9 % IV SOLN
Freq: Once | INTRAVENOUS | Status: AC
  Administered 2012-05-13: 13:00:00 via INTRAVENOUS

## 2012-05-13 MED ORDER — SODIUM CHLORIDE 0.9 % IV SOLN
5.0000 mg/kg | Freq: Once | INTRAVENOUS | Status: AC
  Administered 2012-05-13: 350 mg via INTRAVENOUS
  Filled 2012-05-13: qty 14

## 2012-05-13 NOTE — Patient Instructions (Signed)
Bevacizumab injection What is this medicine? BEVACIZUMAB (be va SIZ yoo mab) is a chemotherapy drug. It targets a protein found in many cancer cell types, and halts cancer growth. This drug treats many cancers including non-small cell lung cancer, and colon or rectal cancer. It is usually given with other chemotherapy drugs. This medicine may be used for other purposes; ask your health care provider or pharmacist if you have questions. What should I tell my health care provider before I take this medicine? They need to know if you have any of these conditions: -blood clots -heart disease, including heart failure, heart attack, or chest pain (angina) -high blood pressure -infection (especially a virus infection such as chickenpox, cold sores, or herpes) -kidney disease -lung disease -prior chemotherapy with doxorubicin, daunorubicin, epirubicin, or other anthracycline type chemotherapy agents -recent or ongoing radiation therapy -recent surgery -stroke -an unusual or allergic reaction to bevacizumab, hamster proteins, mouse proteins, other medicines, foods, dyes, or preservatives -pregnant or trying to get pregnant -breast-feeding How should I use this medicine? This medicine is for infusion into a vein. It is given by a health care professional in a hospital or clinic setting. Talk to your pediatrician regarding the use of this medicine in children. Special care may be needed. Overdosage: If you think you have taken too much of this medicine contact a poison control center or emergency room at once. NOTE: This medicine is only for you. Do not share this medicine with others. What if I miss a dose? It is important not to miss your dose. Call your doctor or health care professional if you are unable to keep an appointment. What may interact with this medicine? Interactions are not expected. This list may not describe all possible interactions. Give your health care provider a list of all  the medicines, herbs, non-prescription drugs, or dietary supplements you use. Also tell them if you smoke, drink alcohol, or use illegal drugs. Some items may interact with your medicine. What should I watch for while using this medicine? Your condition will be monitored carefully while you are receiving this medicine. You will need important blood work and urine testing done while you are taking this medicine. During your treatment, let your health care professional know if you have any unusual symptoms, such as difficulty breathing. This medicine may rarely cause 'gastrointestinal perforation' (holes in the stomach, intestines or colon), a serious side effect requiring surgery to repair. This medicine should be started at least 28 days following major surgery and the site of the surgery should be totally healed. Check with your doctor before scheduling dental work or surgery while you are receiving this treatment. Talk to your doctor if you have recently had surgery or if you have a wound that has not healed. Do not become pregnant while taking this medicine. Women should inform their doctor if they wish to become pregnant or think they might be pregnant. There is a potential for serious side effects to an unborn child. Talk to your health care professional or pharmacist for more information. Do not breast-feed an infant while taking this medicine. This medicine has caused ovarian failure in some women. This medicine may interfere with the ability to have a child. You should talk to your doctor or health care professional if you are concerned about your fertility. What side effects may I notice from receiving this medicine? Side effects that you should report to your doctor or health care professional as soon as possible: -allergic reactions like skin   rash, itching or hives, swelling of the face, lips, or tongue -signs of infection - fever or chills, cough, sore throat, pain or trouble passing  urine -signs of decreased platelets or bleeding - bruising, pinpoint red spots on the skin, black, tarry stools, nosebleeds, blood in the urine -breathing problems -changes in vision -chest pain -confusion -jaw pain, especially after dental work -mouth sores -seizures -severe abdominal pain -severe headache -sudden numbness or weakness of the face, arm or leg -swelling of legs or ankles -symptoms of a stroke: change in mental awareness, inability to talk or move one side of the body (especially in patients with lung cancer) -trouble passing urine or change in the amount of urine -trouble speaking or understanding -trouble walking, dizziness, loss of balance or coordination Side effects that usually do not require medical attention (report to your doctor or health care professional if they continue or are bothersome): -constipation -diarrhea -dry skin -headache -loss of appetite -nausea, vomiting This list may not describe all possible side effects. Call your doctor for medical advice about side effects. You may report side effects to FDA at 1-800-FDA-1088. Where should I keep my medicine? This drug is given in a hospital or clinic and will not be stored at home. NOTE: This sheet is a summary. It may not cover all possible information. If you have questions about this medicine, talk to your doctor, pharmacist, or health care provider.  2012, [employer]. (06/29/2010 4:25:37 PM) 

## 2012-05-13 NOTE — Progress Notes (Signed)
This office note has been dictated.

## 2012-05-14 ENCOUNTER — Ambulatory Visit: Payer: Medicare Other

## 2012-05-14 ENCOUNTER — Other Ambulatory Visit: Payer: Medicare Other | Admitting: Lab

## 2012-05-14 ENCOUNTER — Ambulatory Visit: Payer: Medicare Other | Admitting: Medical

## 2012-05-14 NOTE — Progress Notes (Signed)
DIAGNOSIS:  Metastatic cholangiocarcinoma.  CURRENT THERAPY: 1. Avastin 5 mg/kg IV every 2 weeks. 2. Tarceva 150 mg p.o. daily.  INTERIM HISTORY:  [name] comes in for her followup.  She has recovered nicely from the cyst that was infected on the back of her head.  This required drainage.  We had to hold the Avastin, I think for a couple of treatments in order for this incision to heal up.  This has healed up very well now.  She is feeling well.  She is having no abdominal pain.  There is no chest pain.  She is having no cough.  There are no problems with bowels or bladder.  She may be a little constipated.  She has not noticed any leg swelling.  Overall, her performance status is ECOG 1.  PHYSICAL EXAMINATION:  General:  This is a well-developed, well- nourished African American female in no obvious distress.  Vital Signs: Temperature 98.3, pulse 58, respiratory rate 18, blood pressure 161/99, weight is 153.  Head and Neck Exam:  Shows a normocephalic, atraumatic skull.  There are no ocular or oral lesions.  There are no palpable cervical or supraclavicular lymph nodes.  Lungs:  Clear bilaterally. Cardiac Exam:  Regular rate and rhythm with a normal S1 and S2.  There are no murmurs, rubs, or bruits.  Abdominal Exam:  Soft with good bowel sounds.  There is no palpable abdominal mass.  She has a well-healed laparotomy scar.  There is no fluid wave.  There is no palpable hepatosplenomegaly.  Back Exam:  Shows the right lateral chest wall excisional biopsy scar well healed.  There is no nodularity or tenderness over the incision site.  No tenderness is noted over the spine.  Extremities:  Show no clubbing, cyanosis, or edema.  Scalp Exam: Shows complete healing of the surgical incision on the occipital region of her scalp.  LABORATORY STUDIES:  White cell count is 4.6, hemoglobin 10.2, hematocrit 30.6, platelet count 70.  IMPRESSION:  [name] is a 61 year old black female with  metastatic cholangiocarcinoma.  She actually is doing incredibly well.  We initially diagnosed her back in, in think April 2012.  We have tried to hold on actual cytotoxic chemotherapy because her bone marrow has been suffering.  Her bone marrow is doing much better now. She still has some mild thrombocytopenia, which she is asymptomatic with.  I really want to try to go with 4 cycles of treatment before we do a scan on her.  We will go ahead with her treatment today.  This incision on the occipital region of her scalp has healed up well.  I want to see her back in 2 weeks.  At that point in time, then we will repeat her scans and see how things look.    ______________________________ Josph Macho, M.D. PRE/MEDQ  D:  05/13/2012  T:  05/14/2012  Job:  [id number]

## 2012-05-20 ENCOUNTER — Ambulatory Visit: Payer: Medicare Other

## 2012-05-20 ENCOUNTER — Other Ambulatory Visit: Payer: Medicare Other | Admitting: Lab

## 2012-05-20 ENCOUNTER — Ambulatory Visit: Payer: Medicare Other | Admitting: Medical

## 2012-05-22 IMAGING — CT CT ABD-PELV W/ CM
2 of 4 series · 17 of 46 positions shown, 19 images · IV contrast (agent unspecified)
Comparison: 04/23/2010

CLINICAL DATA: Upper abdominal pain with nausea vomiting.  Previous
cholecystectomy.

CT ABDOMEN AND PELVIS WITH CONTRAST
TECHNIQUE: Multidetector CT imaging of the abdomen and pelvis was
performed following the standard protocol during bolus
administration of intravenous contrast.
Contrast: 100 ccs 5mnipaque-MBB

[Series 2: rtn a/p with · axial · 0.67mm/px · z∈[-559,-139]mm · 14 of 92 slices shown, 16 images]
[im 4/92  soft-tissue]
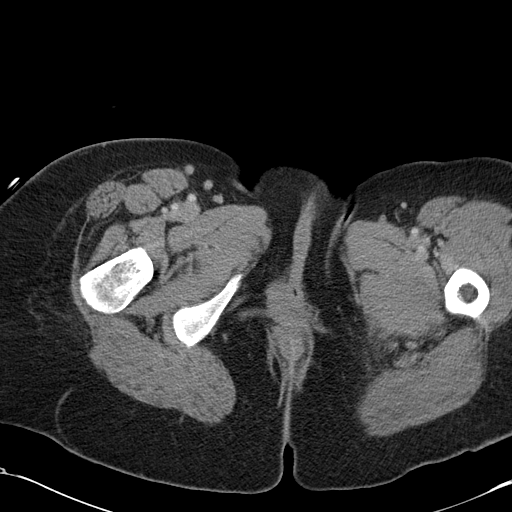
[im 4/92  bone]
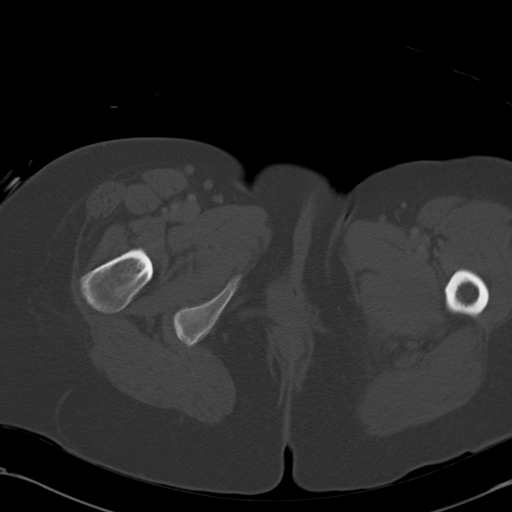
[im 12/92  soft-tissue]
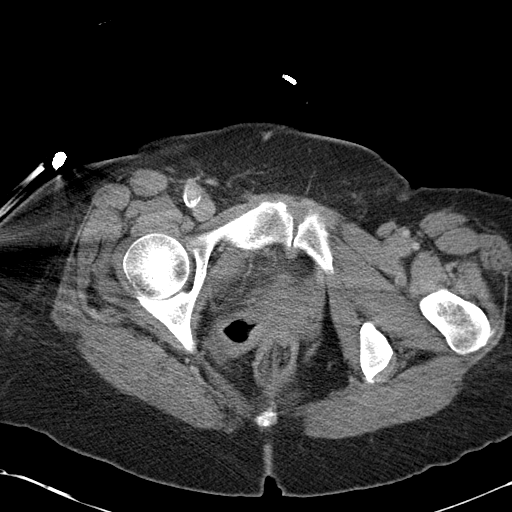
[im 16/92  soft-tissue]
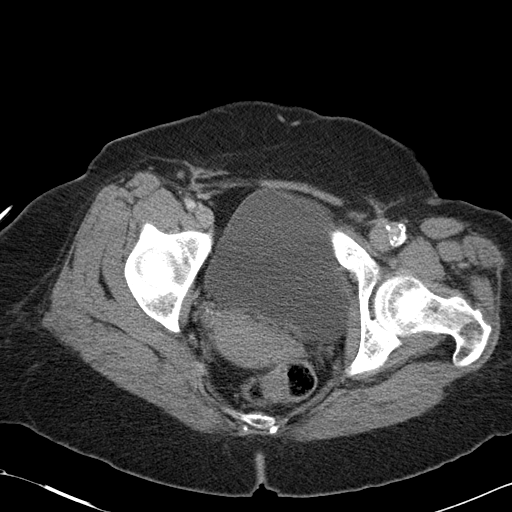
[im 24/92  soft-tissue]
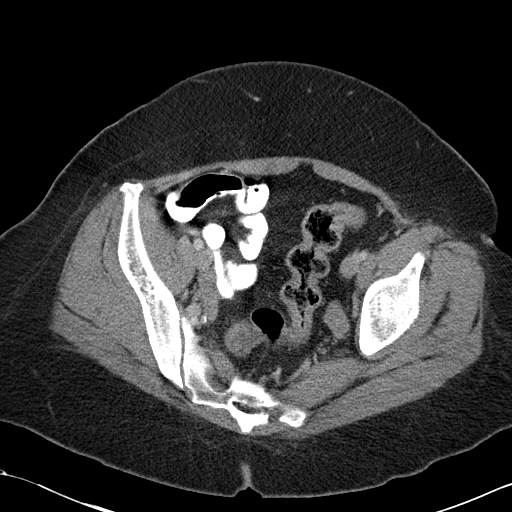
[im 32/92  soft-tissue]
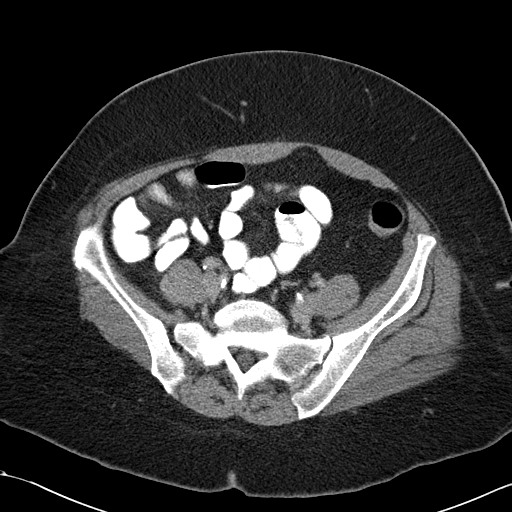
[im 36/92  soft-tissue]
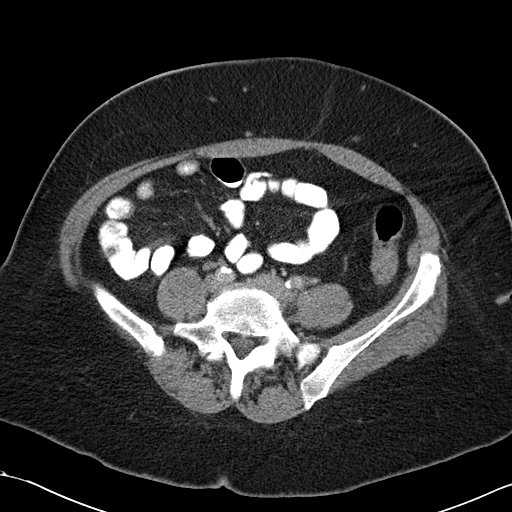
[im 44/92  soft-tissue]
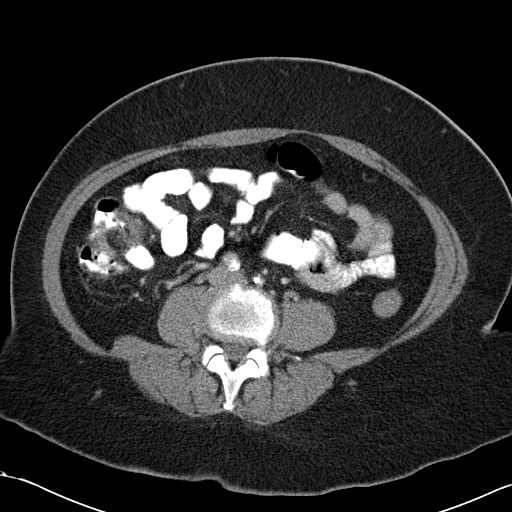
[im 48/92  soft-tissue]
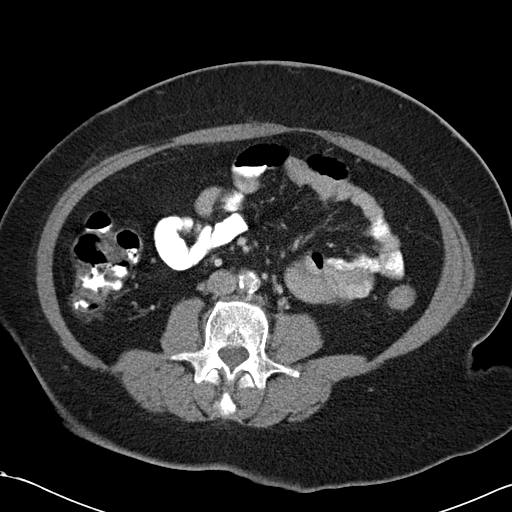
[im 56/92  soft-tissue]
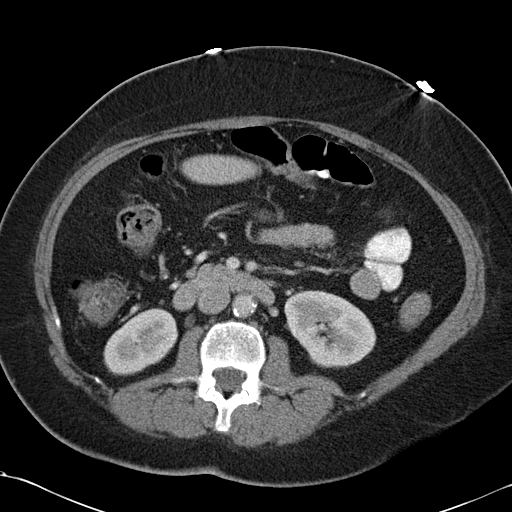
[im 56/92  bone]
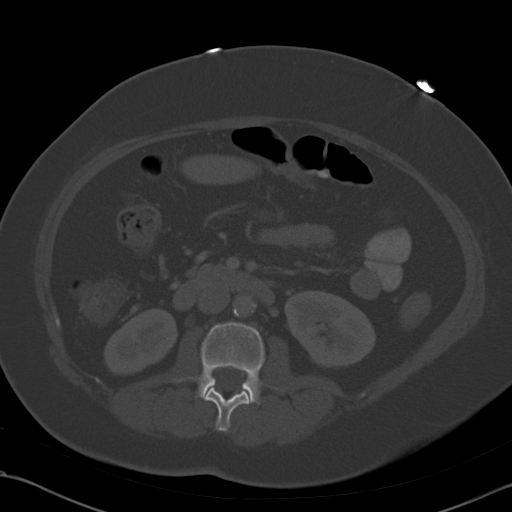
[im 60/92  soft-tissue]
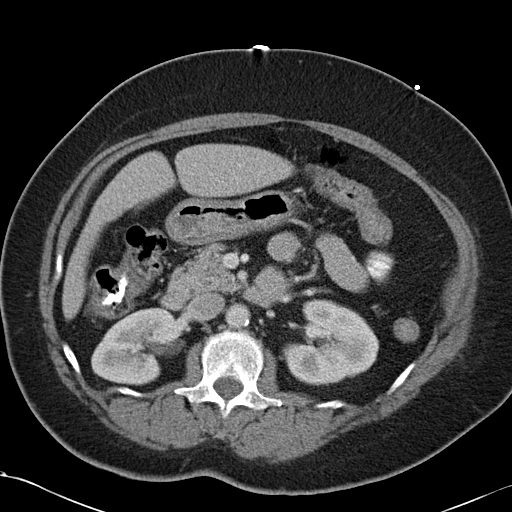
[im 68/92  soft-tissue]
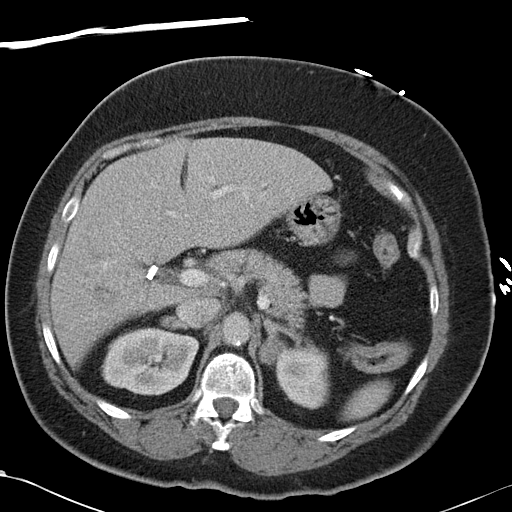
[im 76/92  soft-tissue]
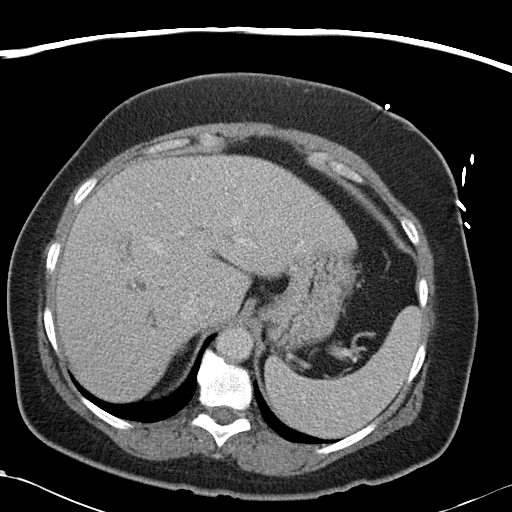
[im 80/92  soft-tissue]
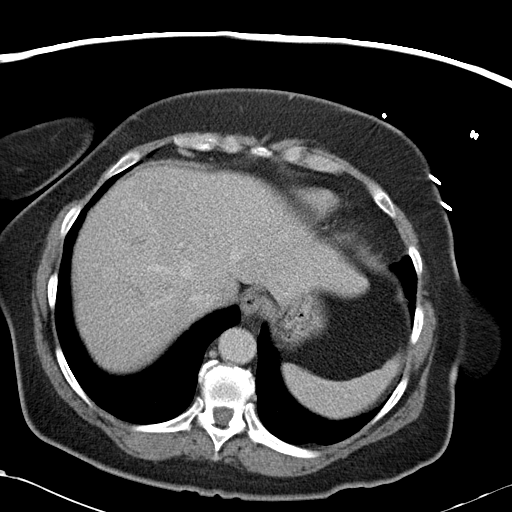
[im 88/92  soft-tissue]
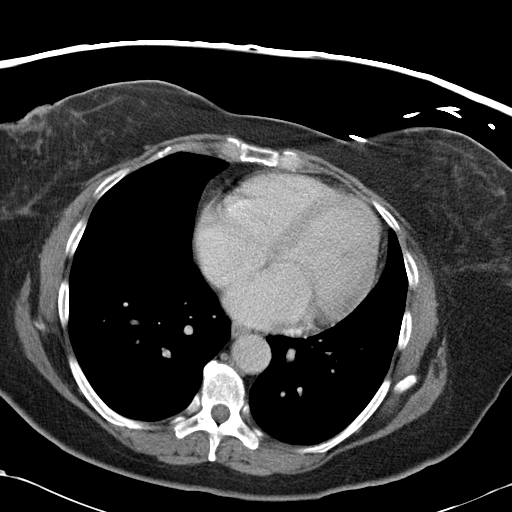

[Series 602: <mpr thick range> · coronal · 0.89mm/px · 3 of 95 slices shown]
[im 32/95  soft-tissue]
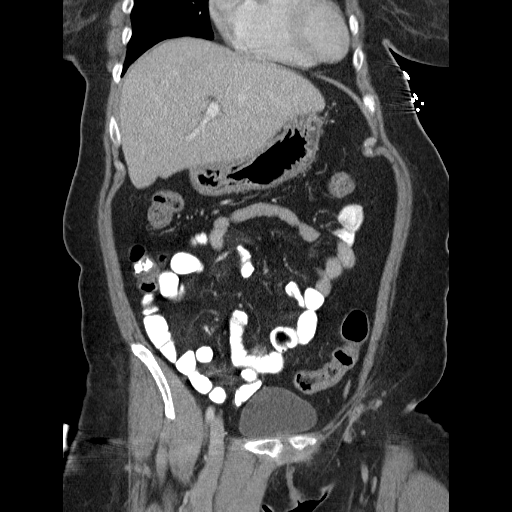
[im 42/95  soft-tissue]
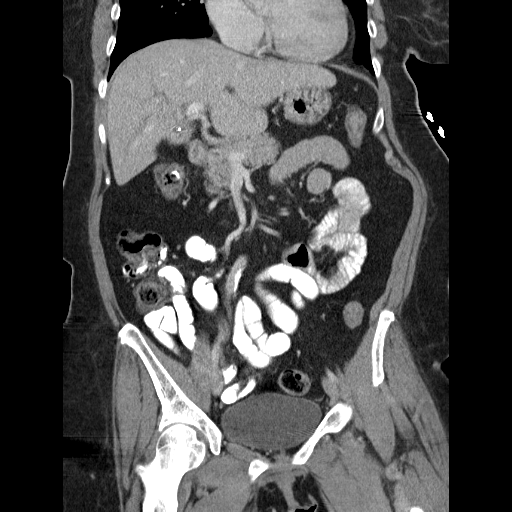
[im 53/95  soft-tissue]
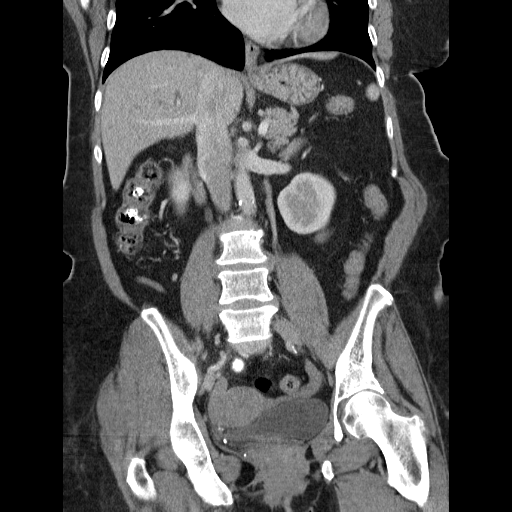

[17 of 46 positions shown; findings below may reference images not displayed]

FINDINGS: The liver, spleen, pancreas and kidneys are normal.
Bilateral adrenal adenomas are stable.

There is no adenopathy, free air, free fluid, or other abnormality.
The uterus and ovaries and appendix and terminal ileum are all
normal.  No dilated bowel or diverticular disease.  No significant
osseous abnormality.
IMPRESSION: Benign-appearing abdomen and pelvis.

## 2012-05-27 ENCOUNTER — Ambulatory Visit (HOSPITAL_BASED_OUTPATIENT_CLINIC_OR_DEPARTMENT_OTHER): Payer: Medicare Other | Admitting: Medical

## 2012-05-27 ENCOUNTER — Other Ambulatory Visit: Payer: Self-pay | Admitting: Medical

## 2012-05-27 ENCOUNTER — Other Ambulatory Visit (HOSPITAL_BASED_OUTPATIENT_CLINIC_OR_DEPARTMENT_OTHER): Payer: Medicare Other | Admitting: Lab

## 2012-05-27 ENCOUNTER — Ambulatory Visit (HOSPITAL_BASED_OUTPATIENT_CLINIC_OR_DEPARTMENT_OTHER): Payer: Medicare Other

## 2012-05-27 VITALS — BP 150/100

## 2012-05-27 VITALS — BP 177/99 | HR 58 | Temp 98.4°F | Resp 18 | Ht 63.0 in | Wt 156.0 lb

## 2012-05-27 DIAGNOSIS — I10 Essential (primary) hypertension: Secondary | ICD-10-CM

## 2012-05-27 DIAGNOSIS — D696 Thrombocytopenia, unspecified: Secondary | ICD-10-CM

## 2012-05-27 DIAGNOSIS — C7800 Secondary malignant neoplasm of unspecified lung: Secondary | ICD-10-CM

## 2012-05-27 DIAGNOSIS — C221 Intrahepatic bile duct carcinoma: Secondary | ICD-10-CM

## 2012-05-27 DIAGNOSIS — K922 Gastrointestinal hemorrhage, unspecified: Secondary | ICD-10-CM

## 2012-05-27 DIAGNOSIS — Z5112 Encounter for antineoplastic immunotherapy: Secondary | ICD-10-CM

## 2012-05-27 DIAGNOSIS — R197 Diarrhea, unspecified: Secondary | ICD-10-CM

## 2012-05-27 DIAGNOSIS — D509 Iron deficiency anemia, unspecified: Secondary | ICD-10-CM

## 2012-05-27 LAB — CBC WITH DIFFERENTIAL (CANCER CENTER ONLY)
BASO#: 0 10*3/uL (ref 0.0–0.2)
HCT: 32.5 % — ABNORMAL LOW (ref 34.8–46.6)
HGB: 10.8 g/dL — ABNORMAL LOW (ref 11.6–15.9)
LYMPH#: 0.4 10*3/uL — ABNORMAL LOW (ref 0.9–3.3)
MONO#: 0.5 10*3/uL (ref 0.1–0.9)
NEUT%: 73.5 % (ref 39.6–80.0)
WBC: 3.9 10*3/uL (ref 3.9–10.0)

## 2012-05-27 LAB — BASIC METABOLIC PANEL
BUN: 16 mg/dL (ref 6–23)
Chloride: 108 mEq/L (ref 96–112)
Glucose, Bld: 79 mg/dL (ref 70–99)
Potassium: 4.1 mEq/L (ref 3.5–5.3)

## 2012-05-27 MED ORDER — SODIUM CHLORIDE 0.9 % IJ SOLN
10.0000 mL | INTRAMUSCULAR | Status: DC | PRN
  Administered 2012-05-27: 10 mL
  Filled 2012-05-27: qty 10

## 2012-05-27 MED ORDER — HEPARIN SOD (PORK) LOCK FLUSH 100 UNIT/ML IV SOLN
500.0000 [IU] | Freq: Once | INTRAVENOUS | Status: AC | PRN
  Administered 2012-05-27: 500 [IU]
  Filled 2012-05-27: qty 5

## 2012-05-27 MED ORDER — CLONIDINE HCL 0.1 MG PO TABS
0.1000 mg | ORAL_TABLET | Freq: Once | ORAL | Status: AC
  Administered 2012-05-27: 0.1 mg via ORAL

## 2012-05-27 MED ORDER — SODIUM CHLORIDE 0.9 % IV SOLN
Freq: Once | INTRAVENOUS | Status: AC
  Administered 2012-05-27: 11:00:00 via INTRAVENOUS

## 2012-05-27 MED ORDER — SODIUM CHLORIDE 0.9 % IV SOLN
5.0000 mg/kg | Freq: Once | INTRAVENOUS | Status: AC
  Administered 2012-05-27: 350 mg via INTRAVENOUS
  Filled 2012-05-27: qty 14

## 2012-05-27 NOTE — Progress Notes (Addendum)
Diagnosis: Metastatic cholangiocarcinoma.  Current therapy: #1 Avastin 5 mg per kilogram, IV every 2 weeks. #2 Tarceva 150 mg by mouth daily.  Interim history: [name] presents today for an office followup visit.  Overall, she, reports, that she's been doing relatively well.  She remains on her.  Tarceva, and Avastin.  We did have to hold her Avastin.  For a while as she had an abscess, drained from her scalp.  That is now well healed without any problems, and she can go ahead and receive the Avastin.  She does report some intermittent diarrhea, and utilizes Imodium.  She reports, that recently.  She has had to take more of her nausea medication.  She's not had any active, vomiting.  She does report some intermittent abdominal pain.  She is due for repeat CT scan of the chest, abdomen, and pelvis, which we will go ahead and gets set up.  She denies any cough, chest pain, or shortness of breath.  She denies any fevers, chills, or night sweats.  She denies any lower leg swelling.  She denies any obvious, or abnormal bleeding.  She still gets around quite well without any problems. EGOG performance status is a 1  Review of Systems: Intermittent diarrhea and nausea, intermittent abdominal pain, otherwise: Pt. Denies any changes in their vision, hearing, adenopathy, fevers, chills, vomiting, constipation, chest pain, shortness of breath, passing blood, passing out, blacking out,  any changes in skin, joints, neurologic or psychiatric except as noted.  Physical Exam: This is a 61 year old, well-developed, well-nourished, female, in no obvious distress Vitals: Temperature 90.4 degrees.  Pulse 50 respirations 18, blood pressure 177/99, weight 156 pounds HEENT reveals a normocephalic, atraumatic skull, no scleral icterus, no oral lesions  Neck is supple without any cervical or supraclavicular adenopathy.  Lungs are clear to auscultation bilaterally. There are no wheezes, rales or rhonci Cardiac is regular  rate and rhythm with a normal S1 and S2. There are no murmurs, rubs, or bruits.  Abdomen is soft with good bowel sounds, there is no palpable mass. There is no palpable hepatosplenomegaly. There is no palpable fluid wave.  She does have a well healed laparotomy scar. Musculoskeletal no tenderness of the spine, ribs, or hips.  Extremities there are no clubbing, cyanosis, or edema.  Skin no petechia, purpura or ecchymosis Neurologic is nonfocal. Back exam: Shows a right lateral chest wall, excisional biopsy scar that is well-healed.  Laboratory Data: White count 3.9, hemoglobin 10.8, hematocrit 32.5.  Platelets 67,000.  Urine protein, dipstick is 30 mg/dL  Current Outpatient Prescriptions on File Prior to Visit  Medication Sig Dispense Refill  . ALPHA LIPOIC ACID PO Take 1 capsule by mouth every morning.       . B Complex Vitamins (VITAMIN B COMPLEX IJ) Inject as directed every morning.      Marland Kitchen dexamethasone (DECADRON) 4 MG tablet Take 4-8 mg by mouth daily with breakfast. Take 2 tabs by mouth days 1 and 3 of chemo cycle, then 1 tab by mouth days 4 and 5 of each cycle of chemo      . erlotinib (TARCEVA) 150 MG tablet Take 1 tablet (150 mg total) by mouth daily. Take on an empty stomach 1 hour before meals or 2 hours after.  28 tablet  4  . folic acid (FOLVITE) 1 MG tablet Take 1 tablet (1 mg total) by mouth daily.  30 tablet  0  . gabapentin (NEURONTIN) 300 MG capsule Take 2 capsules (600 mg total)  by mouth 3 (three) times daily.  180 capsule  3  . lidocaine-prilocaine (EMLA) cream Apply 1 application topically once. Apply one hour before chemo.      . metoCLOPramide (REGLAN) 10 MG tablet Take 10 mg by mouth every 6 (six) hours as needed. For nausea and vomiting      . metoprolol succinate (TOPROL-XL) 100 MG 24 hr tablet Take 1 tablet (100 mg total) by mouth daily.  90 tablet  3  . nitroGLYCERIN (NITROSTAT) 0.4 MG SL tablet Place 0.4 mg under the tongue every 5 (five) minutes as needed. For chest  pain      . Omeprazole 20 MG TBEC Take 1 tablet (20 mg total) by mouth 2 (two) times daily.  180 each  3  . oxycodone (OXY-IR) 5 MG capsule Take 1 capsule (5 mg total) by mouth every 6 (six) hours as needed. For pain  30 capsule  0  . simvastatin (ZOCOR) 40 MG tablet Take 1 tablet (40 mg total) by mouth at bedtime.  31 tablet  1  . sucralfate (CARAFATE) 1 GM/10ML suspension Take 1 g by mouth every 6 (six) hours.       Assessment/Plan: This is a pleasant, 61 year old, African American, female, with the following issues:  #1 .  Metastatic cholangiocarcinoma.  She was diagnosed back in April, 2012.  Overall, she has done remarkably well.  She does have some mild thrombocytopenia, which she is asymptomatic with.  The plan is to go ahead and get her set up with repeat CT scans of the chest, abdomen, and pelvis prior to her next visit.  Again, we have tried to hold off on actual cytotoxic chemotherapy because her bone marrow, has been suffering.  Her bone marrow, is doing much better now.  She will go ahead and receive her treatment with Avastin today.  #2 intermittent diarrhea, most likely secondary to the Tarceva.  She will continue to utilize Imodium.  New.  #3 intermittent nausea.  She will continue utilizing her anti-emetics.  #4 intermittent abdominal pain.  She will continue on her OXY-IR 5 mg capsule as needed.   #5.  Followup.  [name] will follow back up with [place] in 2 weeks, at which time we will review the results of her CT scan.  We will see her back before then should there be questions or concerns.   Addendum: After [name], blood pressure was checked prior to given the Avastin.  It was 155/101.  I would like to go ahead and give 0.1 mg of clonidine prior to administering the Avastin.  It does look like she is on Toprol-XL 100 mg tablet once a day.  She reports, that she had been on a second blood pressure medication.  However, she was taken off of that do to hypertension.  I advised  [name] to get back in touch with her primary care physician to let them know what her blood pressure has been running, so they can make the necessary adjustments.

## 2012-05-27 NOTE — Patient Instructions (Addendum)
Bevacizumab injection What is this medicine? BEVACIZUMAB (be va SIZ yoo mab) is a chemotherapy drug. It targets a protein found in many cancer cell types, and halts cancer growth. This drug treats many cancers including non-small cell lung cancer, and colon or rectal cancer. It is usually given with other chemotherapy drugs. This medicine may be used for other purposes; ask your health care provider or pharmacist if you have questions. What should I tell my health care provider before I take this medicine? They need to know if you have any of these conditions: -blood clots -heart disease, including heart failure, heart attack, or chest pain (angina) -high blood pressure -infection (especially a virus infection such as chickenpox, cold sores, or herpes) -kidney disease -lung disease -prior chemotherapy with doxorubicin, daunorubicin, epirubicin, or other anthracycline type chemotherapy agents -recent or ongoing radiation therapy -recent surgery -stroke -an unusual or allergic reaction to bevacizumab, hamster proteins, mouse proteins, other medicines, foods, dyes, or preservatives -pregnant or trying to get pregnant -breast-feeding How should I use this medicine? This medicine is for infusion into a vein. It is given by a health care professional in a hospital or clinic setting. Talk to your pediatrician regarding the use of this medicine in children. Special care may be needed. Overdosage: If you think you have taken too much of this medicine contact a poison control center or emergency room at once. NOTE: This medicine is only for you. Do not share this medicine with others. What if I miss a dose? It is important not to miss your dose. Call your doctor or health care professional if you are unable to keep an appointment. What may interact with this medicine? Interactions are not expected. This list may not describe all possible interactions. Give your health care provider a list of all  the medicines, herbs, non-prescription drugs, or dietary supplements you use. Also tell them if you smoke, drink alcohol, or use illegal drugs. Some items may interact with your medicine. What should I watch for while using this medicine? Your condition will be monitored carefully while you are receiving this medicine. You will need important blood work and urine testing done while you are taking this medicine. During your treatment, let your health care professional know if you have any unusual symptoms, such as difficulty breathing. This medicine may rarely cause 'gastrointestinal perforation' (holes in the stomach, intestines or colon), a serious side effect requiring surgery to repair. This medicine should be started at least 28 days following major surgery and the site of the surgery should be totally healed. Check with your doctor before scheduling dental work or surgery while you are receiving this treatment. Talk to your doctor if you have recently had surgery or if you have a wound that has not healed. Do not become pregnant while taking this medicine. Women should inform their doctor if they wish to become pregnant or think they might be pregnant. There is a potential for serious side effects to an unborn child. Talk to your health care professional or pharmacist for more information. Do not breast-feed an infant while taking this medicine. This medicine has caused ovarian failure in some women. This medicine may interfere with the ability to have a child. You should talk to your doctor or health care professional if you are concerned about your fertility. What side effects may I notice from receiving this medicine? Side effects that you should report to your doctor or health care professional as soon as possible: -allergic reactions like skin   rash, itching or hives, swelling of the face, lips, or tongue -signs of infection - fever or chills, cough, sore throat, pain or trouble passing  urine -signs of decreased platelets or bleeding - bruising, pinpoint red spots on the skin, black, tarry stools, nosebleeds, blood in the urine -breathing problems -changes in vision -chest pain -confusion -jaw pain, especially after dental work -mouth sores -seizures -severe abdominal pain -severe headache -sudden numbness or weakness of the face, arm or leg -swelling of legs or ankles -symptoms of a stroke: change in mental awareness, inability to talk or move one side of the body (especially in patients with lung cancer) -trouble passing urine or change in the amount of urine -trouble speaking or understanding -trouble walking, dizziness, loss of balance or coordination Side effects that usually do not require medical attention (report to your doctor or health care professional if they continue or are bothersome): -constipation -diarrhea -dry skin -headache -loss of appetite -nausea, vomiting This list may not describe all possible side effects. Call your doctor for medical advice about side effects. You may report side effects to FDA at 1-800-FDA-1088. Where should I keep my medicine? This drug is given in a hospital or clinic and will not be stored at home. NOTE: This sheet is a summary. It may not cover all possible information. If you have questions about this medicine, talk to your doctor, pharmacist, or health care provider.  2012, [employer]. (06/29/2010 4:25:37 PM) 

## 2012-05-27 NOTE — Progress Notes (Signed)
Patients blood pressure is 155/101.  Alerted Eunice Blase PA.  Waiting 15 minutes to recheck 1120  BP taken manually 168/94.  Eunice Blase PA ordered Clonidine .1 mg  1320 BP 120/90  Ok to proceed with Avastin per Eunice Blase PA

## 2012-06-03 ENCOUNTER — Ambulatory Visit (HOSPITAL_BASED_OUTPATIENT_CLINIC_OR_DEPARTMENT_OTHER)
Admission: RE | Admit: 2012-06-03 | Discharge: 2012-06-03 | Disposition: A | Discharge status: Home / Self Care | Payer: Medicare Other | Source: Ambulatory Visit | Attending: Medical | Admitting: Medical

## 2012-06-03 ENCOUNTER — Ambulatory Visit (HOSPITAL_BASED_OUTPATIENT_CLINIC_OR_DEPARTMENT_OTHER): Payer: Medicare Other

## 2012-06-03 VITALS — BP 181/108 | HR 63 | Temp 97.8°F | Resp 18

## 2012-06-03 DIAGNOSIS — I724 Aneurysm of artery of lower extremity: Secondary | ICD-10-CM | POA: Insufficient documentation

## 2012-06-03 DIAGNOSIS — C221 Intrahepatic bile duct carcinoma: Secondary | ICD-10-CM

## 2012-06-03 DIAGNOSIS — I517 Cardiomegaly: Secondary | ICD-10-CM | POA: Insufficient documentation

## 2012-06-03 DIAGNOSIS — R161 Splenomegaly, not elsewhere classified: Secondary | ICD-10-CM | POA: Insufficient documentation

## 2012-06-03 DIAGNOSIS — Z452 Encounter for adjustment and management of vascular access device: Secondary | ICD-10-CM

## 2012-06-03 DIAGNOSIS — C7800 Secondary malignant neoplasm of unspecified lung: Secondary | ICD-10-CM

## 2012-06-03 DIAGNOSIS — I745 Embolism and thrombosis of iliac artery: Secondary | ICD-10-CM | POA: Insufficient documentation

## 2012-06-03 DIAGNOSIS — I2510 Atherosclerotic heart disease of native coronary artery without angina pectoris: Secondary | ICD-10-CM | POA: Insufficient documentation

## 2012-06-03 DIAGNOSIS — R911 Solitary pulmonary nodule: Secondary | ICD-10-CM | POA: Insufficient documentation

## 2012-06-03 DIAGNOSIS — C3490 Malignant neoplasm of unspecified part of unspecified bronchus or lung: Secondary | ICD-10-CM

## 2012-06-03 MED ORDER — SODIUM CHLORIDE 0.9 % IJ SOLN
10.0000 mL | INTRAMUSCULAR | Status: DC | PRN
  Administered 2012-06-03: 10 mL via INTRAVENOUS
  Filled 2012-06-03: qty 10

## 2012-06-03 MED ORDER — HEPARIN SOD (PORK) LOCK FLUSH 100 UNIT/ML IV SOLN
500.0000 [IU] | Freq: Once | INTRAVENOUS | Status: AC
  Administered 2012-06-03: 500 [IU] via INTRAVENOUS
  Filled 2012-06-03: qty 5

## 2012-06-03 MED ORDER — IOHEXOL 300 MG/ML  SOLN
100.0000 mL | Freq: Once | INTRAMUSCULAR | Status: AC | PRN
  Administered 2012-06-03: 100 mL via INTRAVENOUS

## 2012-06-05 ENCOUNTER — Ambulatory Visit (INDEPENDENT_AMBULATORY_CARE_PROVIDER_SITE_OTHER): Payer: Medicare Other | Admitting: Internal Medicine

## 2012-06-05 ENCOUNTER — Encounter: Payer: Self-pay | Admitting: Internal Medicine

## 2012-06-05 VITALS — BP 147/95 | HR 56 | Temp 98.3°F | Ht 63.0 in | Wt 156.5 lb

## 2012-06-05 DIAGNOSIS — I10 Essential (primary) hypertension: Secondary | ICD-10-CM

## 2012-06-05 MED ORDER — LISINOPRIL 10 MG PO TABS: 10.0000 mg | ORAL_TABLET | Freq: Every day | ORAL | Status: DC

## 2012-06-05 NOTE — Patient Instructions (Addendum)
We have started another blood pressure medicine for you (lisinopril). Take it once a day and return for a re-check in ~2 weeks.

## 2012-06-05 NOTE — Progress Notes (Signed)
  Subjective:    Patient ID: [name], female    DOB: October 26, 1950, 61 y.o.   MRN: [id number]  HPI History significant for hyperlipidemia, hypertension, and peripheral vascular disease with claudication who presents today for evaluation of her hypertension. 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 Her regimen consist of metoprolol XL 100 mg daily. Denies chest pain shortness of breath.   Review of Systems  Constitutional: Positive for fatigue. Negative for fever.  Respiratory: Negative for cough and shortness of breath.   Cardiovascular: Negative for chest pain.  Gastrointestinal: Negative for abdominal pain.  Musculoskeletal: Negative for joint swelling.  Neurological: Negative for headaches.       Objective:   Physical Exam  Constitutional: She is oriented to person, place, and time. She appears well-developed and well-nourished. No distress.       In wheelchair with family member present  HENT:  Head: Normocephalic and atraumatic.  Cardiovascular: Normal rate, regular rhythm and normal heart sounds.   Pulmonary/Chest: Breath sounds normal. No respiratory distress. She has no wheezes. She exhibits no tenderness.  Abdominal: Soft. Bowel sounds are normal.  Musculoskeletal: She exhibits no edema.  Neurological: She is alert and oriented to person, place, and time.  Skin: Skin is warm and dry.  Psychiatric: She has a normal mood and affect.          Assessment & Plan:  #1 hypertension: Above goal since starting chemotherapy on metformin XL 100 mg daily. Today BP 147/95 with pulse of 56 bpm. Will defer her increase in beta blocker given current pulse rate. Will add ACE inhibitor today.  Creatinine 0.99 -start lisinopril 10 mg daily -Return for followup BP check in 2 weeks  #2 metastatic cholangiocarcinoma to lung: Currently undergoing chemotherapy.  Patient's blood pressure control should be reassessed after completion of treatment.  #3 preventive care: Patient declines flu shot today.

## 2012-06-11 ENCOUNTER — Other Ambulatory Visit (HOSPITAL_BASED_OUTPATIENT_CLINIC_OR_DEPARTMENT_OTHER): Payer: Medicare Other | Admitting: Lab

## 2012-06-11 ENCOUNTER — Other Ambulatory Visit: Payer: Self-pay | Admitting: *Deleted

## 2012-06-11 ENCOUNTER — Ambulatory Visit (HOSPITAL_BASED_OUTPATIENT_CLINIC_OR_DEPARTMENT_OTHER): Payer: Medicare Other

## 2012-06-11 ENCOUNTER — Other Ambulatory Visit: Payer: Self-pay | Admitting: Hematology & Oncology

## 2012-06-11 ENCOUNTER — Ambulatory Visit (HOSPITAL_BASED_OUTPATIENT_CLINIC_OR_DEPARTMENT_OTHER): Payer: Medicare Other | Admitting: Hematology & Oncology

## 2012-06-11 VITALS — BP 153/88 | HR 59 | Temp 98.4°F | Resp 18 | Ht 63.0 in | Wt 154.0 lb

## 2012-06-11 DIAGNOSIS — C221 Intrahepatic bile duct carcinoma: Secondary | ICD-10-CM

## 2012-06-11 DIAGNOSIS — C7800 Secondary malignant neoplasm of unspecified lung: Secondary | ICD-10-CM

## 2012-06-11 DIAGNOSIS — Z5111 Encounter for antineoplastic chemotherapy: Secondary | ICD-10-CM

## 2012-06-11 DIAGNOSIS — I70209 Unspecified atherosclerosis of native arteries of extremities, unspecified extremity: Secondary | ICD-10-CM

## 2012-06-11 LAB — COMPREHENSIVE METABOLIC PANEL
ALT: 16 U/L (ref 0–35)
Alkaline Phosphatase: 112 U/L (ref 39–117)
BUN: 19 mg/dL (ref 6–23)
Calcium: 9.1 mg/dL (ref 8.4–10.5)
Glucose, Bld: 86 mg/dL (ref 70–99)
Potassium: 4.1 mEq/L (ref 3.5–5.3)
Total Protein: 6 g/dL (ref 6.0–8.3)

## 2012-06-11 LAB — CBC WITH DIFFERENTIAL (CANCER CENTER ONLY)
BASO#: 0 10*3/uL (ref 0.0–0.2)
Eosinophils Absolute: 0.1 10*3/uL (ref 0.0–0.5)
HCT: 32.4 % — ABNORMAL LOW (ref 34.8–46.6)
HGB: 10.8 g/dL — ABNORMAL LOW (ref 11.6–15.9)
LYMPH%: 12.5 % — ABNORMAL LOW (ref 14.0–48.0)
MCH: 32.5 pg (ref 26.0–34.0)
MCV: 98 fL (ref 81–101)
MONO%: 9.9 % (ref 0.0–13.0)
NEUT#: 3.2 10*3/uL (ref 1.5–6.5)
NEUT%: 75.5 % (ref 39.6–80.0)
RBC: 3.32 10*6/uL — ABNORMAL LOW (ref 3.70–5.32)

## 2012-06-11 LAB — TECHNOLOGIST REVIEW CHCC SATELLITE

## 2012-06-11 MED ORDER — SODIUM CHLORIDE 0.9 % IV SOLN
Freq: Once | INTRAVENOUS | Status: AC
  Administered 2012-06-11: 12:00:00 via INTRAVENOUS

## 2012-06-11 MED ORDER — LORAZEPAM 0.5 MG PO TABS: 0.5000 mg | ORAL_TABLET | Freq: Three times a day (TID) | ORAL | Status: DC

## 2012-06-11 MED ORDER — PACLITAXEL PROTEIN-BOUND CHEMO INJECTION 100 MG
100.0000 mg/m2 | Freq: Once | INTRAVENOUS | Status: AC
  Administered 2012-06-11: 175 mg via INTRAVENOUS
  Filled 2012-06-11: qty 35

## 2012-06-11 MED ORDER — DEXAMETHASONE SODIUM PHOSPHATE 10 MG/ML IJ SOLN
10.0000 mg | Freq: Once | INTRAMUSCULAR | Status: AC
  Administered 2012-06-11: 10 mg via INTRAVENOUS

## 2012-06-11 MED ORDER — HEPARIN SOD (PORK) LOCK FLUSH 100 UNIT/ML IV SOLN
500.0000 [IU] | Freq: Once | INTRAVENOUS | Status: AC | PRN
  Administered 2012-06-11: 500 [IU]
  Filled 2012-06-11: qty 5

## 2012-06-11 MED ORDER — SODIUM CHLORIDE 0.9 % IJ SOLN
10.0000 mL | INTRAMUSCULAR | Status: DC | PRN
  Administered 2012-06-11: 10 mL
  Filled 2012-06-11: qty 10

## 2012-06-11 MED ORDER — PROMETHAZINE HCL 25 MG PO TABS: 12.5000 mg | ORAL_TABLET | Freq: Four times a day (QID) | ORAL | Status: DC | PRN

## 2012-06-11 MED ORDER — ONDANSETRON 8 MG/50ML IVPB (CHCC)
8.0000 mg | Freq: Once | INTRAVENOUS | Status: AC
  Administered 2012-06-11: 8 mg via INTRAVENOUS

## 2012-06-11 NOTE — Patient Instructions (Addendum)
Take home medications for nausea 1) Phenergan 12.5 mg.  Take by mouth every 6 hours as needed for nausea 2) Ativan .5 mg.  Take by mouth every 8 hours as needed for nausea.   Nanoparticle Albumin-Bound Paclitaxel injection What is this medicine? NANOPARTICLE ALBUMIN-BOUND PACLITAXEL (Na no PAHR ti kuhl al BYOO muhn-bound PAK li TAX el) is a chemotherapy drug. It targets fast dividing cells, like cancer cells, and causes these cells to die. This medicine is used to treat breast cancer. This medicine may be used for other purposes; ask your health care provider or pharmacist if you have questions. What should I tell my health care provider before I take this medicine? They need to know if you have any of these conditions: -infection (especially a virus infection such as chickenpox, cold sores, or herpes) -kidney disease -liver disease -low blood counts, like low platelets, red blood cells, or white blood cells -recent or ongoing radiation therapy -an unusual or allergic reaction to paclitaxel, albumin, other chemotherapy, other medicines, foods, dyes, or preservatives -pregnant or trying to get pregnant -breast-feeding How should I use this medicine? This drug is given as an infusion into a vein. It is administered in a hospital or clinic by a specially trained health care professional. Talk to your pediatrician regarding the use of this medicine in children. Special care may be needed. Overdosage: If you think you have taken too much of this medicine contact a poison control center or emergency room at once. NOTE: This medicine is only for you. Do not share this medicine with others. What if I miss a dose? It is important not to miss your dose. Call your doctor or health care professional if you are unable to keep an appointment. What may interact with this medicine? This medicine may also interact with the following  medications: -cyclosporine -dexamethasone -diazepam -ketoconazole -medicines to increase blood counts like filgrastim, pegfilgrastim, sargramostim -other chemotherapy drugs like cisplatin, doxorubicin, epirubicin, etoposide, teniposide, vincristine -quinidine -testosterone -vaccines -verapamil Talk to your doctor or health care professional before taking any of these medicines: -acetaminophen -aspirin -ibuprofen -ketoprofen -naproxen This list may not describe all possible interactions. Give your health care provider a list of all the medicines, herbs, non-prescription drugs, or dietary supplements you use. Also tell them if you smoke, drink alcohol, or use illegal drugs. Some items may interact with your medicine. What should I watch for while using this medicine? Your condition will be monitored carefully while you are receiving this medicine. You will need important blood work done while you are taking this medicine. This drug may make you feel generally unwell. This is not uncommon, as chemotherapy can affect healthy cells as well as cancer cells. Report any side effects. Continue your course of treatment even though you feel ill unless your doctor tells you to stop. In some cases, you may be given additional medicines to help with side effects. Follow all directions for their use. Call your doctor or health care professional for advice if you get a fever, chills or sore throat, or other symptoms of a cold or flu. Do not treat yourself. This drug decreases your body's ability to fight infections. Try to avoid being around people who are sick. This medicine may increase your risk to bruise or bleed. Call your doctor or health care professional if you notice any unusual bleeding. Be careful brushing and flossing your teeth or using a toothpick because you may get an infection or bleed more easily. If you have  any dental work done, tell your dentist you are receiving this medicine. Avoid  taking products that contain aspirin, acetaminophen, ibuprofen, naproxen, or ketoprofen unless instructed by your doctor. These medicines may hide a fever. Do not become pregnant while taking this medicine. Women should inform their doctor if they wish to become pregnant or think they might be pregnant. There is a potential for serious side effects to an unborn child. Talk to your health care professional or pharmacist for more information. Do not breast-feed an infant while taking this medicine. Men are advised not to father a child while receiving this medicine. What side effects may I notice from receiving this medicine? Side effects that you should report to your doctor or health care professional as soon as possible: -allergic reactions like skin rash, itching or hives, swelling of the face, lips, or tongue -low blood counts - This drug may decrease the number of white blood cells, red blood cells and platelets. You may be at increased risk for infections and bleeding. -signs of infection - fever or chills, cough, sore throat, pain or difficulty passing urine -signs of decreased platelets or bleeding - bruising, pinpoint red spots on the skin, black, tarry stools, nosebleeds -signs of decreased red blood cells - unusually weak or tired, fainting spells, lightheadedness -breathing problems -changes in vision -chest pain -high or low blood pressure -mouth sores -nausea and vomiting -pain, swelling, redness or irritation at the injection site -pain, tingling, numbness in the hands or feet -slow or irregular heartbeat -swelling of the ankle, feet, hands Side effects that usually do not require medical attention (report to your doctor or health care professional if they continue or are bothersome): -aches, pains -changes in the color of fingernails -diarrhea -hair loss -loss of appetite This list may not describe all possible side effects. Call your doctor for medical advice about side  effects. You may report side effects to FDA at 1-800-FDA-1088. Where should I keep my medicine? This drug is given in a hospital or clinic and will not be stored at home. NOTE: This sheet is a summary. It may not cover all possible information. If you have questions about this medicine, talk to your doctor, pharmacist, or health care provider.  2012, [employer]. (07/25/2008 10:52:48 AM)

## 2012-06-11 NOTE — Progress Notes (Signed)
This office note has been dictated.

## 2012-06-12 ENCOUNTER — Encounter: Payer: Self-pay | Admitting: Hematology & Oncology

## 2012-06-12 NOTE — Progress Notes (Signed)
DIAGNOSIS:  Metastatic cholangiocarcinoma.  CURRENT THERAPY: 1. The patient to start weekly Abraxane (2 weeks on/1 week off). 2. The patient recently treated with Avastin/Tarceva.  INTERIM HISTORY:  [name] comes in for her followup.  She is feeling fairly well.  Unfortunately, her tumor is progressing.  We did repeat her CT scans.  This did show some slight progression of her pulmonary metastasis.  What was more concerning however is that she now has increasing tissue infiltration within the porta hepatis region.  This seems to be emanating from a central liver toward the head of the pancreas.  There is some borderline splenomegaly.  She does have severe atherosclerosis.  She has always had peripheral vascular issues.  She has really had no problems with the Avastin/Tarceva.  Her skin has not had too many issues with the Tarceva.  There has been no diarrhea.  She has not noticed any bleeding.  Her performance status continues to be fairly decent at ECOG 1.  PHYSICAL EXAMINATION:  General:  This is a well-developed, well- nourished black female in no obvious distress.  Vital signs:  98.4, pulse 59, respiratory rate 18, blood pressure 153/88.  Weight is 154. Head and neck:  Shows a normocephalic, atraumatic skull.  There are no ocular or oral lesions.  There are no palpable cervical or supraclavicular lymph nodes.  The area of her abscess in the occipital region has healed nicely.  Lungs:  Clear bilaterally.  Cardiac:  Regular rate and rhythm with a normal S1, S2.  There are no murmurs, rubs or bruits.  Abdomen:  Soft with good bowel sounds.  There is no palpable abdominal mass.  There is no fluid wave.  She has well-healed laparotomy scar.  Extremities:  Shows no clubbing, cyanosis or edema.  Back:  No tenderness over the spine, ribs or hips.  Skin:  No rashes, ecchymoses or petechiae.  LABORATORY STUDIES:  White cell count of 4.2, hemoglobin 10.8, hematocrit 32.4, platelet  count 77,000.  IMPRESSION:  [name] is a 61 year old white female with progressive cholangiocarcinoma.  We have been fighting this now for a good 15-16 months.  She has done very nicely.  I tried to give her a "break" from chemotherapy as her blood counts were suffering.  She really has not had actual chemotherapy since July.  I think we are going to have to get her back on treatment.  I think that single agent Abraxane made not be a bad idea.  This is showing itself to have some activity with pancreatic cancer, which I feel is similar with cholangiocarcinoma.  With her blood counts, I probably want to go 2 weeks on and 1 week off for the Abraxane.  I will hold on Avastin given her peripheral vascular disease issues.  I spent a good 45 minutes or so with [name] and her husband.  [name] is trying her best.  This is a tough situation for her.  She understands that we are trying to treat this so it does not cause her problems.  She has done everything that we have asked for her to do.  We will go ahead and start her today.  I probably want to give her 3 cycles of treatment before we repeat any additional scans to see if there has been a response.    ______________________________ Josph Macho, M.D. PRE/MEDQ  D:  06/11/2012  T:  06/12/2012  Job:  [id number]

## 2012-06-18 ENCOUNTER — Other Ambulatory Visit (HOSPITAL_BASED_OUTPATIENT_CLINIC_OR_DEPARTMENT_OTHER): Payer: Medicare Other | Admitting: Lab

## 2012-06-18 ENCOUNTER — Ambulatory Visit (HOSPITAL_BASED_OUTPATIENT_CLINIC_OR_DEPARTMENT_OTHER): Payer: Medicare Other

## 2012-06-18 DIAGNOSIS — C7800 Secondary malignant neoplasm of unspecified lung: Secondary | ICD-10-CM

## 2012-06-18 DIAGNOSIS — C221 Intrahepatic bile duct carcinoma: Secondary | ICD-10-CM

## 2012-06-18 DIAGNOSIS — Z5111 Encounter for antineoplastic chemotherapy: Secondary | ICD-10-CM

## 2012-06-18 DIAGNOSIS — K922 Gastrointestinal hemorrhage, unspecified: Secondary | ICD-10-CM

## 2012-06-18 DIAGNOSIS — D509 Iron deficiency anemia, unspecified: Secondary | ICD-10-CM

## 2012-06-18 LAB — CBC WITH DIFFERENTIAL (CANCER CENTER ONLY)
BASO%: 0.6 % (ref 0.0–2.0)
Eosinophils Absolute: 0 10*3/uL (ref 0.0–0.5)
HCT: 28.6 % — ABNORMAL LOW (ref 34.8–46.6)
LYMPH%: 26.2 % (ref 14.0–48.0)
MCV: 97 fL (ref 81–101)
MONO#: 0.1 10*3/uL (ref 0.1–0.9)
NEUT%: 64.5 % (ref 39.6–80.0)
RDW: 13.4 % (ref 11.1–15.7)
WBC: 1.7 10*3/uL — ABNORMAL LOW (ref 3.9–10.0)

## 2012-06-18 MED ORDER — ONDANSETRON 8 MG/50ML IVPB (CHCC)
8.0000 mg | Freq: Once | INTRAVENOUS | Status: AC
  Administered 2012-06-18: 8 mg via INTRAVENOUS

## 2012-06-18 MED ORDER — DEXAMETHASONE SODIUM PHOSPHATE 10 MG/ML IJ SOLN
10.0000 mg | Freq: Once | INTRAMUSCULAR | Status: AC
  Administered 2012-06-18: 10 mg via INTRAVENOUS

## 2012-06-18 MED ORDER — PACLITAXEL PROTEIN-BOUND CHEMO INJECTION 100 MG
100.0000 mg/m2 | Freq: Once | INTRAVENOUS | Status: AC
  Administered 2012-06-18: 175 mg via INTRAVENOUS
  Filled 2012-06-18: qty 35

## 2012-06-18 MED ORDER — HEPARIN SOD (PORK) LOCK FLUSH 100 UNIT/ML IV SOLN
500.0000 [IU] | Freq: Once | INTRAVENOUS | Status: AC | PRN
  Administered 2012-06-18: 500 [IU]
  Filled 2012-06-18: qty 5

## 2012-06-18 MED ORDER — SODIUM CHLORIDE 0.9 % IV SOLN
Freq: Once | INTRAVENOUS | Status: AC
  Administered 2012-06-18: 14:00:00 via INTRAVENOUS

## 2012-06-18 MED ORDER — SODIUM CHLORIDE 0.9 % IJ SOLN
10.0000 mL | INTRAMUSCULAR | Status: DC | PRN
  Administered 2012-06-18: 10 mL
  Filled 2012-06-18: qty 10

## 2012-06-18 NOTE — Patient Instructions (Addendum)
Paclitaxel injection What is this medicine? PACLITAXEL (PAK li TAX el) is a chemotherapy drug. It targets fast dividing cells, like cancer cells, and causes these cells to die. This medicine is used to treat ovarian cancer, breast cancer, and other cancers. This medicine may be used for other purposes; ask your health care provider or pharmacist if you have questions. What should I tell my health care provider before I take this medicine? They need to know if you have any of these conditions: -blood disorders -irregular heartbeat -infection (especially a virus infection such as chickenpox, cold sores, or herpes) -liver disease -previous or ongoing radiation therapy -an unusual or allergic reaction to paclitaxel, alcohol, polyoxyethylated castor oil, other chemotherapy agents, other medicines, foods, dyes, or preservatives -pregnant or trying to get pregnant -breast-feeding How should I use this medicine? This drug is given as an infusion into a vein. It is administered in a hospital or clinic by a specially trained health care professional. Talk to your pediatrician regarding the use of this medicine in children. Special care may be needed. Overdosage: If you think you have taken too much of this medicine contact a poison control center or emergency room at once. NOTE: This medicine is only for you. Do not share this medicine with others. What if I miss a dose? It is important not to miss your dose. Call your doctor or health care professional if you are unable to keep an appointment. What may interact with this medicine? Do not take this medicine with any of the following medications: -disulfiram -metronidazole This medicine may also interact with the following medications: -cyclosporine -dexamethasone -diazepam -ketoconazole -medicines to increase blood counts like filgrastim, pegfilgrastim, sargramostim -other chemotherapy drugs like cisplatin, doxorubicin, epirubicin, etoposide,  teniposide, vincristine -quinidine -testosterone -vaccines -verapamil Talk to your doctor or health care professional before taking any of these medicines: -acetaminophen -aspirin -ibuprofen -ketoprofen -naproxen This list may not describe all possible interactions. Give your health care provider a list of all the medicines, herbs, non-prescription drugs, or dietary supplements you use. Also tell them if you smoke, drink alcohol, or use illegal drugs. Some items may interact with your medicine. What should I watch for while using this medicine? Your condition will be monitored carefully while you are receiving this medicine. You will need important blood work done while you are taking this medicine. This drug may make you feel generally unwell. This is not uncommon, as chemotherapy can affect healthy cells as well as cancer cells. Report any side effects. Continue your course of treatment even though you feel ill unless your doctor tells you to stop. In some cases, you may be given additional medicines to help with side effects. Follow all directions for their use. Call your doctor or health care professional for advice if you get a fever, chills or sore throat, or other symptoms of a cold or flu. Do not treat yourself. This drug decreases your body's ability to fight infections. Try to avoid being around people who are sick. This medicine may increase your risk to bruise or bleed. Call your doctor or health care professional if you notice any unusual bleeding. Be careful brushing and flossing your teeth or using a toothpick because you may get an infection or bleed more easily. If you have any dental work done, tell your dentist you are receiving this medicine. Avoid taking products that contain aspirin, acetaminophen, ibuprofen, naproxen, or ketoprofen unless instructed by your doctor. These medicines may hide a fever. Do not become pregnant   while taking this medicine. Women should inform their  doctor if they wish to become pregnant or think they might be pregnant. There is a potential for serious side effects to an unborn child. Talk to your health care professional or pharmacist for more information. Do not breast-feed an infant while taking this medicine. Men are advised not to father a child while receiving this medicine. What side effects may I notice from receiving this medicine? Side effects that you should report to your doctor or health care professional as soon as possible: -allergic reactions like skin rash, itching or hives, swelling of the face, lips, or tongue -low blood counts - This drug may decrease the number of white blood cells, red blood cells and platelets. You may be at increased risk for infections and bleeding. -signs of infection - fever or chills, cough, sore throat, pain or difficulty passing urine -signs of decreased platelets or bleeding - bruising, pinpoint red spots on the skin, black, tarry stools, nosebleeds -signs of decreased red blood cells - unusually weak or tired, fainting spells, lightheadedness -breathing problems -chest pain -high or low blood pressure -mouth sores -nausea and vomiting -pain, swelling, redness or irritation at the injection site -pain, tingling, numbness in the hands or feet -slow or irregular heartbeat -swelling of the ankle, feet, hands Side effects that usually do not require medical attention (report to your doctor or health care professional if they continue or are bothersome): -bone pain -complete hair loss including hair on your head, underarms, pubic hair, eyebrows, and eyelashes -changes in the color of fingernails -diarrhea -loosening of the fingernails -loss of appetite -muscle or joint pain -red flush to skin -sweating This list may not describe all possible side effects. Call your doctor for medical advice about side effects. You may report side effects to FDA at 1-800-FDA-1088. Where should I keep my  medicine? This drug is given in a hospital or clinic and will not be stored at home. NOTE: This sheet is a summary. It may not cover all possible information. If you have questions about this medicine, talk to your doctor, pharmacist, or health care provider.  2012, [employer]. (07/11/2008 11:54:26 AM) 

## 2012-06-18 NOTE — Progress Notes (Signed)
Dr. Myna Hidalgo aware of platelets and labs, OK to treat. Teola Bradley, Giabella Duhart [employer]

## 2012-06-19 ENCOUNTER — Ambulatory Visit: Payer: Medicare Other | Admitting: Internal Medicine

## 2012-06-22 ENCOUNTER — Other Ambulatory Visit: Payer: Self-pay | Admitting: *Deleted

## 2012-06-22 ENCOUNTER — Ambulatory Visit (HOSPITAL_BASED_OUTPATIENT_CLINIC_OR_DEPARTMENT_OTHER): Payer: Medicare Other | Admitting: Medical

## 2012-06-22 ENCOUNTER — Other Ambulatory Visit (HOSPITAL_BASED_OUTPATIENT_CLINIC_OR_DEPARTMENT_OTHER): Payer: Medicare Other | Admitting: Lab

## 2012-06-22 ENCOUNTER — Ambulatory Visit (HOSPITAL_BASED_OUTPATIENT_CLINIC_OR_DEPARTMENT_OTHER): Payer: Medicare Other

## 2012-06-22 VITALS — BP 118/70 | HR 82 | Temp 100.0°F | Resp 14 | Ht 63.0 in

## 2012-06-22 DIAGNOSIS — R112 Nausea with vomiting, unspecified: Secondary | ICD-10-CM

## 2012-06-22 DIAGNOSIS — C221 Intrahepatic bile duct carcinoma: Secondary | ICD-10-CM

## 2012-06-22 DIAGNOSIS — D759 Disease of blood and blood-forming organs, unspecified: Secondary | ICD-10-CM

## 2012-06-22 DIAGNOSIS — C7800 Secondary malignant neoplasm of unspecified lung: Secondary | ICD-10-CM

## 2012-06-22 DIAGNOSIS — E860 Dehydration: Secondary | ICD-10-CM

## 2012-06-22 DIAGNOSIS — D709 Neutropenia, unspecified: Secondary | ICD-10-CM

## 2012-06-22 DIAGNOSIS — D61810 Antineoplastic chemotherapy induced pancytopenia: Secondary | ICD-10-CM

## 2012-06-22 LAB — CBC WITH DIFFERENTIAL (CANCER CENTER ONLY)
BASO#: 0 10*3/uL (ref 0.0–0.2)
BASO%: 2.6 % — ABNORMAL HIGH (ref 0.0–2.0)
HCT: 26.2 % — ABNORMAL LOW (ref 34.8–46.6)
HGB: 8.7 g/dL — ABNORMAL LOW (ref 11.6–15.9)
LYMPH#: 0.1 10*3/uL — ABNORMAL LOW (ref 0.9–3.3)
MONO#: 0 10*3/uL — ABNORMAL LOW (ref 0.1–0.9)
NEUT%: 57.9 % (ref 39.6–80.0)
RBC: 2.72 10*6/uL — ABNORMAL LOW (ref 3.70–5.32)
WBC: 0.4 10*3/uL — CL (ref 3.9–10.0)

## 2012-06-22 LAB — CMP (CANCER CENTER ONLY)
ALT(SGPT): 11 U/L (ref 10–47)
AST: 16 U/L (ref 11–38)
Alkaline Phosphatase: 73 U/L (ref 26–84)
BUN, Bld: 15 mg/dL (ref 7–22)
Chloride: 102 mEq/L (ref 98–108)
Creat: 0.8 mg/dl (ref 0.6–1.2)
Potassium: 3.8 mEq/L (ref 3.3–4.7)

## 2012-06-22 MED ORDER — FILGRASTIM 300 MCG/0.5ML IJ SOLN
480.0000 ug | Freq: Once | INTRAMUSCULAR | Status: AC
  Administered 2012-06-22: 480 ug via SUBCUTANEOUS
  Filled 2012-06-22: qty 0.8

## 2012-06-22 MED ORDER — FILGRASTIM 480 MCG/0.8ML IJ SOLN: 480.0000 ug | Freq: Once | INTRAMUSCULAR | Status: DC

## 2012-06-22 MED ORDER — SODIUM CHLORIDE 0.9 % IV SOLN
INTRAVENOUS | Status: DC
  Administered 2012-06-22: 15:00:00 via INTRAVENOUS

## 2012-06-22 NOTE — Progress Notes (Signed)
Diagnosis: Metastatic cholangiocarcinoma.  Current therapy:  #1 the patient is status post 1 cycle of Abraxane ( 2 weeks on/1 week off). #2 the patient was recently treated with Avastin/Tarceva.  Interim history: I am seeing Mrs [name]  today as a work in.  Her husband accompanies her.  [name] was here on 06/18/2012 to receive her cycle 1 day 8 of Abraxane.  Her husband reports, that the following day she started having nausea, and vomiting, and this lasted through Sunday.  She was unable to take in fluids or food by mouth.  Apparently, he tried to get her to go to the emergency room, but she refused.  She does not report any fevers with this.  She is not reporting any abdominal pain.  She does report diarrhea.  She denies any chest pain, cough, or shortness of breath.  She denies any fevers, chills, or night sweats.  She denies any obvious, bleeding.  She denies any headaches, visual changes, or rashes.  She just recently started on the Abraxane, so it's hard to say if this was secondary to chemotherapy versus viral.  Of note, [name] recent CT scans did reveal tumor progression.  She does have increasing, tissue infiltration within the porta hepatis region.  This seems to be emanating from a central liver toward the head of the pancreas.  There is some borderline splenomegaly.  Prior to progression she was on Avastin/Tarceva.  We decided to switch her over to Abraxane, secondary to tumor progression. She is quite neutropenic today.  We will go ahead and give her a dose of Neupogen 480 mcg. Marland Kitchen  Her hemoglobin is 8.7, today.  Her bone marrow, was arty compromise prior to starting Abraxane.  We will have to monitor this closely.  We will plan ongoing.  Her back in tomorrow for Neupogen, IV fluids, and lab work.  Review of Systems: Constitutional:Negative for malaise/fatigue, fever, chills, weight loss, diaphoresis, activity change, appetite change, and unexpected weight change.  HEENT: Negative for  double vision, blurred vision, visual loss, ear pain, tinnitus, congestion, rhinorrhea, epistaxis sore throat or sinus disease, oral pain/lesion, tongue soreness Respiratory: Negative for cough, chest tightness, shortness of breath, wheezing and stridor.  Cardiovascular: Negative for chest pain, palpitations, leg swelling, orthopnea, PND, DOE or claudication Gastrointestinal: Positive for nausea, vomiting, diarrhea  Negative for abdominal pain,  constipation, blood in stool, melena, hematochezia, abdominal distention, anal bleeding, rectal pain, anorexia and hematemesis.  Genitourinary: Negative for dysuria, frequency, hematuria,  Musculoskeletal: Negative for myalgias, back pain, joint swelling, arthralgias and gait problem.  Skin: Negative for rash, color change, pallor and wound.  Neurological:. Negative for dizziness/light-headedness, tremors, seizures, syncope, facial asymmetry, speech difficulty, weakness, numbness, headaches and paresthesias.  Hematological: Negative for adenopathy. Does not bruise/bleed easily.  Psychiatric/Behavioral:  Negative for depression, no loss of interest in normal activity or change in sleep pattern.   Physical Exam: This is an ill appearing, 61 year old, African American female, in no obvious distress Vitals: Temperature 100.0 degrees, pulse 82, respirations 14, blood pressure 118/70, weight not taken today HEENT reveals a normocephalic, atraumatic skull, no scleral icterus, no oral lesions  Neck is supple without any cervical or supraclavicular adenopathy.  Lungs are clear to auscultation bilaterally. There are no wheezes, rales or rhonci Cardiac is regular rate and rhythm with a normal S1 and S2. There are no murmurs, rubs, or bruits.  Abdomen is soft with good bowel sounds, there is no palpable mass. There is no palpable hepatosplenomegaly. There is  no palpable fluid wave.  Musculoskeletal no tenderness of the spine, ribs, or hips.  Extremities there are no  clubbing, cyanosis, or edema.  Skin no petechia, purpura or ecchymosis Neurologic is nonfocal.  Laboratory Data: White count 2.4, hemoglobin 8.7, hematocrit 26.2, platelets 66,000  Current Outpatient Prescriptions on File Prior to Visit  Medication Sig Dispense Refill  . ALPHA LIPOIC ACID PO Take 1 capsule by mouth every morning.       . B Complex Vitamins (VITAMIN B COMPLEX IJ) Inject as directed every morning.      Marland Kitchen dexamethasone (DECADRON) 4 MG tablet Take 4-8 mg by mouth daily with breakfast. Take 2 tabs by mouth days 1 and 3 of chemo cycle, then 1 tab by mouth days 4 and 5 of each cycle of chemo      . erlotinib (TARCEVA) 150 MG tablet Take 1 tablet (150 mg total) by mouth daily. Take on an empty stomach 1 hour before meals or 2 hours after.  28 tablet  4  . folic acid (FOLVITE) 1 MG tablet Take 1 tablet (1 mg total) by mouth daily.  30 tablet  0  . gabapentin (NEURONTIN) 300 MG capsule Take 2 capsules (600 mg total) by mouth 3 (three) times daily.  180 capsule  3  . lidocaine-prilocaine (EMLA) cream APPLY TO THE AFFECTED AREA AS DIRECTED  30 g  4  . lisinopril (PRINIVIL,ZESTRIL) 10 MG tablet Take 1 tablet (10 mg total) by mouth daily.  30 tablet  11  . LORazepam (ATIVAN) 0.5 MG tablet Take 1 tablet (0.5 mg total) by mouth every 8 (eight) hours. For nausea  30 tablet  0  . metoCLOPramide (REGLAN) 10 MG tablet Take 10 mg by mouth every 6 (six) hours as needed. For nausea and vomiting      . metoprolol succinate (TOPROL-XL) 100 MG 24 hr tablet Take 1 tablet (100 mg total) by mouth daily.  90 tablet  3  . nitroGLYCERIN (NITROSTAT) 0.4 MG SL tablet Place 0.4 mg under the tongue every 5 (five) minutes as needed. For chest pain      . Omeprazole 20 MG TBEC Take 1 tablet (20 mg total) by mouth 2 (two) times daily.  180 each  3  . oxycodone (OXY-IR) 5 MG capsule Take 1 capsule (5 mg total) by mouth every 6 (six) hours as needed. For pain  30 capsule  0  . promethazine (PHENERGAN) 25 MG tablet  Take 0.5 tablets (12.5 mg total) by mouth every 6 (six) hours as needed for nausea.  30 tablet  2  . simvastatin (ZOCOR) 40 MG tablet Take 1 tablet (40 mg total) by mouth at bedtime.  31 tablet  1  . sucralfate (CARAFATE) 1 GM/10ML suspension Take 1 g by mouth every 6 (six) hours.       Assessment/Plan: This is a pleasant, 61 year old, African American female, with the following issues:  #1  Nausea/vomiting/diarrhea.  Again, it is hard to say if this was secondary to the, Abraxane or a viral illness.  She is dehydrated, as such, we will go ahead and give her IV fluids today.  We will also bring her back tomorrow for more IV fluids.  We'll have to monitor Mrs. Grandpre closely.  #2.  Pancytopenia.  This is secondary to chemotherapy.  We will go ahead and give her Neupogen injection 480 mcg today.  We will bring her back for another Neupogen injection tomorrow.  We will also check labs on her tomorrow.  I did review neutropenic precautions with her and she understands if her temperature rises to 101, to give our office a call or go to the emergency room.  In terms of her thrombocytopenia.  She is asymptomatic.  There is no bleeding, issues.  In terms of her anemia, we will continue to monitor her hemoglobin and transfuse if need be.  #3.  Progressive cholangiocarcinoma.  She is status post 1 cycle of Abraxane.  Again, this is given 2 weeks on and one-week off, secondary to compromised bone marrow.  #4.  Followup.  We will follow back up with Ms. Darcus Austin tomorrow.

## 2012-06-22 NOTE — Progress Notes (Signed)
Pt's dgtr called several times today concerned about her mother's condition. Since receiving her chemo she has been having nausea, vomiting, and diarrhea. She is unable to keep anything down to include her pills. Encouraged her to bring her mother in to be seen. She said she would have talk to her 1st as her father tried to take her to the ED over the weekend and she refused to go. Another call came in approx 20 min later stating Mr. Gills was going to bring her in. She

## 2012-06-23 ENCOUNTER — Ambulatory Visit (HOSPITAL_BASED_OUTPATIENT_CLINIC_OR_DEPARTMENT_OTHER): Payer: Medicare Other

## 2012-06-23 ENCOUNTER — Inpatient Hospital Stay (HOSPITAL_COMMUNITY): Payer: Medicare Other

## 2012-06-23 ENCOUNTER — Other Ambulatory Visit: Payer: Self-pay | Admitting: *Deleted

## 2012-06-23 ENCOUNTER — Encounter (HOSPITAL_COMMUNITY): Payer: Self-pay

## 2012-06-23 ENCOUNTER — Encounter (HOSPITAL_COMMUNITY)
Admission: RE | Admit: 2012-06-23 | Discharge: 2012-06-23 | Disposition: A | Discharge status: Home / Self Care | Payer: Medicare Other | Source: Ambulatory Visit | Attending: Hematology & Oncology | Admitting: Hematology & Oncology

## 2012-06-23 ENCOUNTER — Inpatient Hospital Stay (HOSPITAL_COMMUNITY)
Admission: AD | Admit: 2012-06-23 | Discharge: 2012-06-27 | DRG: 809 | Disposition: A | Discharge status: Home / Self Care | Payer: Medicare Other | Source: Ambulatory Visit | Attending: Hematology & Oncology | Admitting: Hematology & Oncology

## 2012-06-23 ENCOUNTER — Other Ambulatory Visit: Payer: Self-pay | Admitting: Medical

## 2012-06-23 ENCOUNTER — Other Ambulatory Visit (HOSPITAL_BASED_OUTPATIENT_CLINIC_OR_DEPARTMENT_OTHER): Payer: Medicare Other | Admitting: Lab

## 2012-06-23 VITALS — BP 132/82 | HR 80 | Temp 99.1°F | Resp 16

## 2012-06-23 DIAGNOSIS — C7800 Secondary malignant neoplasm of unspecified lung: Secondary | ICD-10-CM

## 2012-06-23 DIAGNOSIS — C801 Malignant (primary) neoplasm, unspecified: Secondary | ICD-10-CM | POA: Diagnosis present

## 2012-06-23 DIAGNOSIS — R197 Diarrhea, unspecified: Secondary | ICD-10-CM | POA: Diagnosis not present

## 2012-06-23 DIAGNOSIS — D709 Neutropenia, unspecified: Secondary | ICD-10-CM

## 2012-06-23 DIAGNOSIS — Z882 Allergy status to sulfonamides status: Secondary | ICD-10-CM

## 2012-06-23 DIAGNOSIS — R110 Nausea: Secondary | ICD-10-CM

## 2012-06-23 DIAGNOSIS — D649 Anemia, unspecified: Secondary | ICD-10-CM | POA: Diagnosis present

## 2012-06-23 DIAGNOSIS — E860 Dehydration: Secondary | ICD-10-CM | POA: Diagnosis present

## 2012-06-23 DIAGNOSIS — C221 Intrahepatic bile duct carcinoma: Secondary | ICD-10-CM | POA: Diagnosis present

## 2012-06-23 DIAGNOSIS — F3289 Other specified depressive episodes: Secondary | ICD-10-CM | POA: Diagnosis present

## 2012-06-23 DIAGNOSIS — I739 Peripheral vascular disease, unspecified: Secondary | ICD-10-CM | POA: Diagnosis present

## 2012-06-23 DIAGNOSIS — Z888 Allergy status to other drugs, medicaments and biological substances status: Secondary | ICD-10-CM

## 2012-06-23 DIAGNOSIS — R5081 Fever presenting with conditions classified elsewhere: Secondary | ICD-10-CM | POA: Diagnosis present

## 2012-06-23 DIAGNOSIS — D6481 Anemia due to antineoplastic chemotherapy: Secondary | ICD-10-CM

## 2012-06-23 DIAGNOSIS — Z79899 Other long term (current) drug therapy: Secondary | ICD-10-CM

## 2012-06-23 DIAGNOSIS — E785 Hyperlipidemia, unspecified: Secondary | ICD-10-CM | POA: Diagnosis present

## 2012-06-23 DIAGNOSIS — E876 Hypokalemia: Secondary | ICD-10-CM | POA: Diagnosis present

## 2012-06-23 DIAGNOSIS — I10 Essential (primary) hypertension: Secondary | ICD-10-CM | POA: Diagnosis present

## 2012-06-23 DIAGNOSIS — F329 Major depressive disorder, single episode, unspecified: Secondary | ICD-10-CM | POA: Diagnosis present

## 2012-06-23 DIAGNOSIS — Z9221 Personal history of antineoplastic chemotherapy: Secondary | ICD-10-CM

## 2012-06-23 DIAGNOSIS — Z87891 Personal history of nicotine dependence: Secondary | ICD-10-CM

## 2012-06-23 DIAGNOSIS — D509 Iron deficiency anemia, unspecified: Secondary | ICD-10-CM

## 2012-06-23 DIAGNOSIS — K219 Gastro-esophageal reflux disease without esophagitis: Secondary | ICD-10-CM | POA: Diagnosis present

## 2012-06-23 LAB — CBC WITH DIFFERENTIAL (CANCER CENTER ONLY)
BASO#: 0 10*3/uL (ref 0.0–0.2)
BASO%: 0 % (ref 0.0–2.0)
EOS%: 0 % (ref 0.0–7.0)
HCT: 25.7 % — ABNORMAL LOW (ref 34.8–46.6)
HGB: 8.4 g/dL — ABNORMAL LOW (ref 11.6–15.9)
LYMPH%: 22.4 % (ref 14.0–48.0)
MCH: 31.8 pg (ref 26.0–34.0)
MCHC: 32.7 g/dL (ref 32.0–36.0)
MONO%: 4.5 % (ref 0.0–13.0)
NEUT%: 73.1 % (ref 39.6–80.0)
RDW: 13 % (ref 11.1–15.7)

## 2012-06-23 LAB — CMP (CANCER CENTER ONLY)
ALT(SGPT): 16 U/L (ref 10–47)
CO2: 29 mEq/L (ref 18–33)
Calcium: 8.8 mg/dL (ref 8.0–10.3)
Chloride: 101 mEq/L (ref 98–108)
Potassium: 3.7 mEq/L (ref 3.3–4.7)
Sodium: 140 mEq/L (ref 128–145)
Total Bilirubin: 1.1 mg/dl (ref 0.20–1.60)
Total Protein: 6.2 g/dL — ABNORMAL LOW (ref 6.4–8.1)

## 2012-06-23 LAB — URINALYSIS, ROUTINE W REFLEX MICROSCOPIC
Bilirubin Urine: NEGATIVE
Glucose, UA: NEGATIVE mg/dL
Hgb urine dipstick: NEGATIVE
Specific Gravity, Urine: 1.024 (ref 1.005–1.030)
Urobilinogen, UA: 1 mg/dL (ref 0.0–1.0)

## 2012-06-23 MED ORDER — FILGRASTIM 480 MCG/0.8ML IJ SOLN
480.0000 ug | Freq: Once | INTRAMUSCULAR | Status: AC
  Administered 2012-06-23: 480 ug via SUBCUTANEOUS
  Filled 2012-06-23: qty 0.8

## 2012-06-23 MED ORDER — LORAZEPAM 2 MG/ML IJ SOLN
1.0000 mg | Freq: Once | INTRAMUSCULAR | Status: AC
  Administered 2012-06-23: 1 mg via INTRAVENOUS

## 2012-06-23 MED ORDER — GABAPENTIN 300 MG PO CAPS
600.0000 mg | ORAL_CAPSULE | Freq: Three times a day (TID) | ORAL | Status: DC
  Administered 2012-06-23 – 2012-06-27 (×10): 600 mg via ORAL
  Filled 2012-06-23 (×13): qty 2

## 2012-06-23 MED ORDER — MORPHINE SULFATE 2 MG/ML IJ SOLN
2.0000 mg | INTRAMUSCULAR | Status: DC | PRN
  Administered 2012-06-23: 2 mg via INTRAVENOUS
  Filled 2012-06-23: qty 1

## 2012-06-23 MED ORDER — PANTOPRAZOLE SODIUM 40 MG IV SOLR
40.0000 mg | Freq: Two times a day (BID) | INTRAVENOUS | Status: DC
  Administered 2012-06-23 – 2012-06-24 (×3): 40 mg via INTRAVENOUS
  Filled 2012-06-23 (×5): qty 40

## 2012-06-23 MED ORDER — LORAZEPAM 2 MG/ML IJ SOLN
1.0000 mg | Freq: Four times a day (QID) | INTRAMUSCULAR | Status: DC | PRN
  Administered 2012-06-23: 1 mg via INTRAVENOUS
  Filled 2012-06-23: qty 1

## 2012-06-23 MED ORDER — ACETAMINOPHEN 325 MG PO TABS: 650.0000 mg | ORAL_TABLET | Freq: Once | ORAL | Status: DC

## 2012-06-23 MED ORDER — METOCLOPRAMIDE HCL 10 MG PO TABS
10.0000 mg | ORAL_TABLET | Freq: Three times a day (TID) | ORAL | Status: DC
  Administered 2012-06-23: 10 mg via ORAL
  Filled 2012-06-23 (×6): qty 1

## 2012-06-23 MED ORDER — DIPHENHYDRAMINE HCL 25 MG PO CAPS: 25.0000 mg | ORAL_CAPSULE | Freq: Once | ORAL | Status: DC

## 2012-06-23 MED ORDER — FILGRASTIM 480 MCG/0.8ML IJ SOLN: 480.0000 ug | Freq: Once | INTRAMUSCULAR | Status: DC

## 2012-06-23 MED ORDER — HEPARIN SOD (PORK) LOCK FLUSH 100 UNIT/ML IV SOLN
500.0000 [IU] | Freq: Every day | INTRAVENOUS | Status: AC | PRN
  Administered 2012-06-23: 500 [IU]
  Filled 2012-06-23: qty 5

## 2012-06-23 MED ORDER — METOPROLOL SUCCINATE ER 50 MG PO TB24
50.0000 mg | ORAL_TABLET | Freq: Every day | ORAL | Status: DC
  Administered 2012-06-23 – 2012-06-27 (×5): 50 mg via ORAL
  Filled 2012-06-23 (×5): qty 1

## 2012-06-23 MED ORDER — FILGRASTIM 480 MCG/1.6ML IJ SOLN
480.0000 ug | Freq: Once | INTRAMUSCULAR | Status: AC
  Administered 2012-06-24: 480 ug via SUBCUTANEOUS
  Filled 2012-06-23 (×2): qty 1.6

## 2012-06-23 MED ORDER — SUCRALFATE 1 GM/10ML PO SUSP
1.0000 g | Freq: Four times a day (QID) | ORAL | Status: DC
  Administered 2012-06-23 – 2012-06-27 (×7): 1 g via ORAL
  Filled 2012-06-23 (×19): qty 10

## 2012-06-23 MED ORDER — POTASSIUM CHLORIDE IN NACL 20-0.9 MEQ/L-% IV SOLN
INTRAVENOUS | Status: DC
  Administered 2012-06-23 – 2012-06-25 (×2): via INTRAVENOUS
  Administered 2012-06-25: 1000 mL via INTRAVENOUS
  Filled 2012-06-23 (×8): qty 1000

## 2012-06-23 MED ORDER — ACETAMINOPHEN 325 MG PO TABS
650.0000 mg | ORAL_TABLET | Freq: Once | ORAL | Status: AC
  Administered 2012-06-23: 650 mg via ORAL
  Filled 2012-06-23: qty 2

## 2012-06-23 MED ORDER — FUROSEMIDE 10 MG/ML IJ SOLN
10.0000 mg | Freq: Once | INTRAMUSCULAR | Status: AC
  Administered 2012-06-23: 10 mg via INTRAVENOUS
  Filled 2012-06-23: qty 1

## 2012-06-23 MED ORDER — DEXTROSE 5 % IV SOLN
2.0000 g | Freq: Two times a day (BID) | INTRAVENOUS | Status: DC
  Administered 2012-06-23 – 2012-06-26 (×6): 2 g via INTRAVENOUS
  Filled 2012-06-23 (×7): qty 2

## 2012-06-23 MED ORDER — SODIUM CHLORIDE 0.9 % IV SOLN
250.0000 mL | Freq: Once | INTRAVENOUS | Status: AC
  Administered 2012-06-23: 250 mL via INTRAVENOUS

## 2012-06-23 NOTE — Progress Notes (Signed)
Pt to receive 1 unit of PRBCs today and 1 unit tomorrow per Dr Myna Hidalgo. Verbal orders entered in EMR for STAT crossmatch.

## 2012-06-23 NOTE — Patient Instructions (Signed)
Pain of Unknown Etiology (Pain Without a Known Cause) You have come to your caregiver because of pain. Pain can occur in any part of the body. Often there is not a definite cause. If your laboratory (blood or urine) work was normal and x-rays or other studies were normal, your caregiver may treat you without knowing the cause of the pain. An example of this is the headache. Most headaches are diagnosed by taking a history. This means your caregiver asks you questions about your headaches. Your caregiver determines a treatment based on your answers. Usually testing done for headaches is normal. Often testing is not done unless there is no response to medications. Regardless of where your pain is located today, you can be given medications to make you comfortable. If no physical cause of pain can be found, most cases of pain will gradually leave as suddenly as they came.  If you have a painful condition and no reason can be found for the pain, It is importantthat you follow up with your caregiver. If the pain becomes worse or does not go away, it may be necessary to repeat tests and look further for a possible cause.  Only take over-the-counter or prescription medicines for pain, discomfort, or fever as directed by your caregiver.  For the protection of your privacy, test results can not be given over the phone. Make sure you receive the results of your test. Ask as to how these results are to be obtained if you have not been informed. It is your responsibility to obtain your test results.  You may continue all activities unless the activities cause more pain. When the pain lessens, it is important to gradually resume normal activities. Resume activities by beginning slowly and gradually increasing the intensity and duration of the activities or exercise. During periods of severe pain, bed-rest may be helpful. Lay or sit in any position that is comfortable.  Ice used for acute (sudden) conditions may be  effective. Use a large plastic bag filled with ice and wrapped in a towel. This may provide pain relief.  See your caregiver for continued problems. They can help or refer you for exercises or physical therapy if necessary. If you were given medications for your condition, do not drive, operate machinery or power tools, or sign legal documents for 24 hours. Do not drink alcohol, take sleeping pills, or take other medications that may interfere with treatment. See your caregiver immediately if you have pain that is becoming worse and not relieved by medications. Document Released: 04/23/2001 Document Revised: 10/21/2011 Document Reviewed: 07/29/2005 [facility] Patient Information 2013 [place], [place]. Dehydration, Adult Dehydration is when you lose more fluids from the body than you take in. Vital organs like the kidneys, brain, and heart cannot function without a proper amount of fluids and salt. Any loss of fluids from the body can cause dehydration.  CAUSES   Vomiting.  Diarrhea.  Excessive sweating.  Excessive urine output.  Fever. SYMPTOMS  Mild dehydration  Thirst.  Dry lips.  Slightly dry mouth. Moderate dehydration  Very dry mouth.  Sunken eyes.  Skin does not bounce back quickly when lightly pinched and released.  Dark urine and decreased urine production.  Decreased tear production.  Headache. Severe dehydration  Very dry mouth.  Extreme thirst.  Rapid, weak pulse (more than 100 beats per minute at rest).  Cold hands and feet.  Not able to sweat in spite of heat and temperature.  Rapid breathing.  Blue lips.  Confusion  and lethargy.  Difficulty being awakened.  Minimal urine production.  No tears. DIAGNOSIS  Your caregiver will diagnose dehydration based on your symptoms and your exam. Blood and urine tests will help confirm the diagnosis. The diagnostic evaluation should also identify the cause of dehydration. TREATMENT  Treatment of mild or  moderate dehydration can often be done at home by increasing the amount of fluids that you drink. It is best to drink small amounts of fluid more often. Drinking too much at one time can make vomiting worse. Refer to the home care instructions below. Severe dehydration needs to be treated at the hospital where you will probably be given intravenous (IV) fluids that contain water and electrolytes. HOME CARE INSTRUCTIONS   Ask your caregiver about specific rehydration instructions.  Drink enough fluids to keep your urine clear or pale yellow.  Drink small amounts frequently if you have nausea and vomiting.  Eat as you normally do.  Avoid:  Foods or drinks high in sugar.  Carbonated drinks.  Juice.  Extremely hot or cold fluids.  Drinks with caffeine.  Fatty, greasy foods.  Alcohol.  Tobacco.  Overeating.  Gelatin desserts.  Wash your hands well to avoid spreading bacteria and viruses.  Only take over-the-counter or prescription medicines for pain, discomfort, or fever as directed by your caregiver.  Ask your caregiver if you should continue all prescribed and over-the-counter medicines.  Keep all follow-up appointments with your caregiver. SEEK MEDICAL CARE IF:  You have abdominal pain and it increases or stays in one area (localizes).  You have a rash, stiff neck, or severe headache.  You are irritable, sleepy, or difficult to awaken.  You are weak, dizzy, or extremely thirsty. SEEK IMMEDIATE MEDICAL CARE IF:   You are unable to keep fluids down or you get worse despite treatment.  You have frequent episodes of vomiting or diarrhea.  You have blood or green matter (bile) in your vomit.  You have blood in your stool or your stool looks black and tarry.  You have not urinated in 6 to 8 hours, or you have only urinated a small amount of very dark urine.  You have a fever.  You faint. MAKE SURE YOU:   Understand these instructions.  Will watch your  condition.  Will get help right away if you are not doing well or get worse. Document Released: 07/29/2005 Document Revised: 10/21/2011 Document Reviewed: 03/18/2011 [facility] Patient Information 2013 [place], [place].

## 2012-06-23 NOTE — Progress Notes (Signed)
Patient here for IV fluids.  Patients husband states patient has been throwing up all weekend and is having abdominal pain.  Patient very sad and tearful.  Ativan IV given for nausea per dr. Myna Hidalgo.   1330 Patient still vomiting.  Dr. Lupita Leash here to examine the patient.  Decides to admit patient.

## 2012-06-24 DIAGNOSIS — E860 Dehydration: Secondary | ICD-10-CM

## 2012-06-24 DIAGNOSIS — D709 Neutropenia, unspecified: Principal | ICD-10-CM

## 2012-06-24 DIAGNOSIS — R112 Nausea with vomiting, unspecified: Secondary | ICD-10-CM

## 2012-06-24 DIAGNOSIS — C221 Intrahepatic bile duct carcinoma: Secondary | ICD-10-CM

## 2012-06-24 LAB — COMPREHENSIVE METABOLIC PANEL
BUN: 14 mg/dL (ref 6–23)
CO2: 24 mEq/L (ref 19–32)
Calcium: 7.8 mg/dL — ABNORMAL LOW (ref 8.4–10.5)
Chloride: 105 mEq/L (ref 96–112)
Creatinine, Ser: 0.92 mg/dL (ref 0.50–1.10)
GFR calc Af Amer: 77 mL/min — ABNORMAL LOW (ref >90)
GFR calc non Af Amer: 66 mL/min — ABNORMAL LOW (ref >90)
Glucose, Bld: 101 mg/dL — ABNORMAL HIGH (ref 70–99)
Total Bilirubin: 1.4 mg/dL — ABNORMAL HIGH (ref 0.3–1.2)

## 2012-06-24 LAB — MAGNESIUM: Magnesium: 1.3 mg/dL — ABNORMAL LOW (ref 1.5–2.5)

## 2012-06-24 LAB — CBC
HCT: 28.6 % — ABNORMAL LOW (ref 36.0–46.0)
MCH: 31.2 pg (ref 26.0–34.0)
MCV: 89.1 fL (ref 78.0–100.0)
Platelets: 49 10*3/uL — ABNORMAL LOW (ref 150–400)
RBC: 3.21 MIL/uL — ABNORMAL LOW (ref 3.87–5.11)
WBC: 0.7 10*3/uL — CL (ref 4.0–10.5)

## 2012-06-24 MED ORDER — VANCOMYCIN HCL IN DEXTROSE 1-5 GM/200ML-% IV SOLN
1000.0000 mg | Freq: Once | INTRAVENOUS | Status: AC
  Administered 2012-06-24: 1000 mg via INTRAVENOUS
  Filled 2012-06-24: qty 200

## 2012-06-24 MED ORDER — VANCOMYCIN HCL IN DEXTROSE 1-5 GM/200ML-% IV SOLN
1000.0000 mg | Freq: Two times a day (BID) | INTRAVENOUS | Status: DC
  Administered 2012-06-25 (×2): 1000 mg via INTRAVENOUS
  Filled 2012-06-24 (×4): qty 200

## 2012-06-24 MED ORDER — ACETAMINOPHEN 325 MG PO TABS
325.0000 mg | ORAL_TABLET | ORAL | Status: DC | PRN
  Administered 2012-06-24 – 2012-06-25 (×2): 325 mg via ORAL
  Filled 2012-06-24: qty 1
  Filled 2012-06-24: qty 2

## 2012-06-24 NOTE — Progress Notes (Signed)
Mrs. Wurtz is feeling better. She got 2 units of blood. I will try to increase her diet today. On her abdominal film, there is no obstruction. There may be a little ileus.  She's not complaining of as much abdominal discomfort. There is no diarrhea. She slept well last night.  I do have her on Maxipime. Her urinalysis looked okay. Blood cultures are pending.  There is no shortness of breath. There is no cough.  Her vital signs are temperature of 99 pulse 88 respiratory 20 blood pressure 139/72. Head and neck exam shows no thrush. Oral mucosa slightly dry. Lungs are clear bilaterally. Cardiac exam regular rate and rhythm with a normal S1 and S2. There are no murmurs rubs or bruits. Abdominal exam shows a soft abdomen. Bowel sounds are still decreased. There is not as much tenderness to palpation. There is no fluid wave. Extremities shows no clubbing cyanosis or edema.  We will continue supportive care. We'll see what her blood work looks like later on today. I will advance her diet. We will check her blood cultures. Continue IV antibiotics.  Hewitt Shorts  Psalm 106:1

## 2012-06-24 NOTE — Progress Notes (Signed)
ANTIBIOTIC CONSULT NOTE - INITIAL  Pharmacy Consult for Vancomycin Indication: neutropenic fever  Allergies  Allergen Reactions  . Promethazine Hcl Anxiety and Other (See Comments)    Change in LOC  . Sulfonamide Derivatives Hives    All over the body    Patient Measurements: Height: 5\' 3"  (160 cm) Weight: 153 lb (69.4 kg) IBW/kg (Calculated) : 52.4   Vital Signs: Temp: 101.6 F (38.7 C) (11/13 2050) Temp src: Oral (11/13 2050) BP: 111/66 mmHg (11/13 2050) Pulse Rate: 95  (11/13 2050) Intake/Output from previous day: 11/12 0701 - 11/13 0700 In: 1150 [I.V.:500; Blood:650] Out: -   Labs:  Basename 06/24/12 0645 06/23/12 1026 06/23/12 0949 06/22/12 1439  WBC 0.7* -- 0.7* 0.4*  HGB 10.0* -- 8.4* 8.7*  PLT 49* -- 62* 66*  LABCREA -- -- -- --  CREATININE 0.92 0.9 -- 0.8   Estimated Creatinine Clearance: 60.8 ml/min (by C-G formula based on Cr of 0.92).   Microbiology: Recent Results (from the past 720 hour(s))  CULTURE, BLOOD (ROUTINE X 2)     Status: Normal (Preliminary result)   Collection Time   06/23/12  3:15 PM      Component Value Range Status Comment   Specimen Description BLOOD RIGHT HAND   Final    Special Requests BOTTLES DRAWN AEROBIC ONLY 6CC   Final    Culture  Setup Time 06/23/2012 22:36   Final    Culture     Final    Value:        BLOOD CULTURE RECEIVED NO GROWTH TO DATE CULTURE WILL BE HELD FOR 5 DAYS BEFORE ISSUING A FINAL NEGATIVE REPORT   Report Status PENDING   Incomplete   CULTURE, BLOOD (ROUTINE X 2)     Status: Normal (Preliminary result)   Collection Time   06/23/12  3:25 PM      Component Value Range Status Comment   Specimen Description BLOOD RIGHT ARM   Final    Special Requests BOTTLES DRAWN AEROBIC AND ANAEROBIC 10CC   Final    Culture  Setup Time 06/23/2012 22:36   Final    Culture     Final    Value:        BLOOD CULTURE RECEIVED NO GROWTH TO DATE CULTURE WILL BE HELD FOR 5 DAYS BEFORE ISSUING A FINAL NEGATIVE REPORT   Report  Status PENDING   Incomplete     Assessment:  60 YOF with neutropenic fever in setting of metastatic cholangiocarcinoma, last chemo given 06/18/12.  Day #2 Cefepime, adding vancomycin today as patient remains febrile (Tmax 101.6).  CrCl ~60 ml/min (CG), ~73 ml/min (normalized).  Goal of Therapy:  Vancomycin trough level 15-20 mcg/ml  Plan:  Vancomycin 1g IV q12h. Will f/u SCr and vancomycin trough.  Clance Boll 06/24/2012,9:58 PM

## 2012-06-24 NOTE — Progress Notes (Signed)
On call: temp 101.6, vitals otherwise ok, cultures negative to date, on maxipime since 06-23-12, has PAC. Have requested vancomycin per pharmacy. Ila Mcgill, MD

## 2012-06-24 NOTE — Progress Notes (Signed)
Patient difficult to arouse, and unable to answer what year or who the president is.  Vitals within normal limits.  Dr. Myna Hidalgo notified.  Will continue to monitor.   Philomena Doheny RN

## 2012-06-24 NOTE — H&P (Signed)
[name], [name] NO.:  [id number]  MEDICAL RECORD NO.:  [id number]  LOCATION:                               FACILITY:  [facility]  PHYSICIAN:  Josph Macho, M.D.  DATE OF BIRTH:  1950/09/12  DATE OF ADMISSION:  06/23/2012 DATE OF DISCHARGE:                             HISTORY & PHYSICAL   REASON FOR ADMISSION: 1. Nausea and vomiting. 2. Dehydration. 3. Neutropenia. 4. Metastatic cholangiocarcinoma.  HISTORY OF PRESENT ILLNESS:  [name] is a very nice 61 year old African-American female with metastatic cholangiocarcinoma.  She has progressive disease.  She has been on treatment with salvage therapy with Abraxane.  She has had weekly Abraxane.  Her last treatment was a week ago.  The patient came in yesterday with nausea and vomiting over the weekend.  She refused to go the emergency room.  We gave her IV fluids.  She was noted to have a white cell count of 0.4.  We gave her Neupogen.  She felt better after the IV fluids.  The patient came in again today. She began to decline.  Her white cell count is up to 0.7.  She got another dose of Neupogen at 480 mcg.  Her hemoglobin is 8.4.  I am going to transfuse her as I felt that she would benefit from this.  She had chemistry done, which showed BUN and creatinine of 16 and 0.9. Her calcium was 8.8 with an albumin of 2.9.  I felt that she might need to be admitted.  She was trying her best. Her family was doing their best.  I did feel that she needed to be in the hospital for fluids, radiologic studies, and general support.  Again, we are going to transfuse her in the office, but we will do this while she is in the hospital.  She says she has abdominal pain.  X-rays are pending.  PAST MEDICAL HISTORY:  Remarkable for: 1. Hyperlipidemia. 2. Hypertension. 3. GERD. 4. Peripheral vascular disease with claudication. 5. Metastatic cholangiocarcinoma.  ALLERGIES: 1. PROMETHAZINE. 2.  SULFA.  MEDICATIONS: 1. Neurontin 600 mg p.o. t.i.d. 2. Lisinopril 10 mg p.o. daily. 3. Ativan 0.5 mg p.o. q.8 hours p.r.n. 4. Reglan 10 mg p.o. q.6 hours p.r.n. 5. Toprol-XL 100 mg p.o. daily. 6. Prilosec 20 mg p.o. daily. 7. OxyIR 5 mg p.o. q.6 hours p.r.n.  SOCIAL HISTORY:  Negative for current tobacco use.  She did smoke, but quit 13 years ago.  There was no alcohol use.  PHYSICAL EXAMINATION:  GENERAL:  This is a somewhat ill-appearing black female, in mild distress secondary to abdominal pain. VITAL SIGNS:  Temperature of 99.1, pulse is 90, respiratory rate 16, blood pressure is 110/60. HEAD AND NECK:  Shows no scleral icterus.  Oral mucosa is slightly dry. There is no thrush.  There is no adenopathy in her neck. LUNGS:  Clear bilaterally. CARDIAC:  Regular rate and rhythm with a normal S1, S2.  There are no murmurs, rubs, or bruits. ABDOMEN:  Soft.  She has a healed laparotomy scar.  There is some tenderness in the right upper quadrant.  She has decreased bowel sounds. There is no fluid  wave.  There is no obvious hepatomegaly. EXTREMITIES:  Some chronic trace edema in her lower legs. NEUROLOGIC:  No focal neurological deficits.  IMPRESSION:  [name] is a 61 year old female with metastatic cholangiocarcinoma.  She is on chemotherapy with weekly Abraxane.  I am just some very surprised that her blood counts have been the problem that they are.  Again, her marrow function is a compromised from her past chemotherapy.  We would like to do abdominal film to make sure she is not obstructed. That certainly could be a possibility.  I do not think we need GI consultation as of yet.  She does not need to have an NG tube down.  We will keep her on clear liquids.  I think Neupogen will help.  Her being neutropenic might be an issue.  I think that we are probably going to need to has start her on some IV antibiotics just to be on the "safe side" and cover any issues.  I will  check a urine on her.  We will continue Neupogen in the hospital.  For now, she is a full code.     Josph Macho, M.D.     PRE/MEDQ  D:  06/23/2012  T:  06/24/2012  Job:  [id number]

## 2012-06-24 NOTE — Progress Notes (Signed)
INITIAL ADULT NUTRITION ASSESSMENT Date: 06/24/2012   Time: 11:46 AM Reason for Assessment: Nutrition risk   INTERVENTION: Encouraged gradual increased meal intake as nausea improves. Will monitor.   ASSESSMENT: Female 61 y.o.  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 Pt reports typically eating well, 3 meals/day with stable weight, however since 06/19/12 she has not been able to keep any foods/fluids down. Pt reports nausea improving today, no vomiting today.   Hx:  Past Medical History  Diagnosis Date  . Hypertension   . Hyperlipidemia   . GERD (gastroesophageal reflux disease)   . Depression   . Esophageal stricture 2003     [place] post esophageal dilation in 2003 performed by Dr. Marina Goodell, second esophageal dilation performed in 2006  by Dr. Marina Goodell  . Obstructive jaundice due to malignant neoplasm  February 2012     status post ERCP done by Dr. Marina Goodell with biliary sphincterotomy, bile duct stricture brushings, biliary stent placement, performed secondary to obstructive jaundice, malignant-appearing bile duct stricture of the common hepatic duct worrisome for primary cholangiocarcinoma, status post ERCP with biliary sphincterotomy and biliary stent placement...status post biopsy -  markedly atypical cells cons  . Rotator cuff syndrome 2010     per MRI March 2010 there is a prominent articular surface partial tear of the supraspinatus which is nearly full thickness, as well as a tear of the distal anterior supraspinatus which probably represents a full-thickness partial tear,  with moderate supraspinatus tendinopathy, moderate infraspinatus tendinopathy and prominent undersurface tear of the acromion along the coracoacromial ligament  . Hiatal hernia   . SOB (shortness of breath) on exertion   . Bile duct adenocarcinoma   . Myocardial infarction   . Angina   . Headache   . Arthritis   .  Dysrhythmia   . Peripheral vascular disease   . Anemia   . Diarrhea   . Nausea   . Cholangiocarcinoma   . Complication of anesthesia    Related Meds:  Scheduled Meds:   . [COMPLETED] acetaminophen  650 mg Oral Once  . ceFEPIme (MAXIPIME) 2 GM IVP  2 g Intravenous Q12H  . [COMPLETED] filgrastim (NEUPOGEN)  SQ  480 mcg Subcutaneous Once  . [COMPLETED] furosemide  10 mg Intravenous Once  . gabapentin  600 mg Oral TID  . metoprolol succinate  50 mg Oral Daily  . pantoprazole  40 mg Intravenous Q12H  . sucralfate  1 g Oral Q6H  . [DISCONTINUED] filgrastim  480 mcg Subcutaneous Once  . [DISCONTINUED] metoCLOPramide  10 mg Oral TID AC & HS   Continuous Infusions:   . 0.9 % NaCl with KCl 20 mEq / L 100 mL/hr at 06/23/12 1525   PRN Meds:.LORazepam, morphine injection  Ht: 5\' 3"  (160 cm)  Wt: 153 lb (69.4 kg)  Ideal Wt: 115 lb % Ideal Wt: 133  Usual Wt: 153-156 lb % Usual Wt: 98-100  Wt Readings from Last 10 Encounters:  06/23/12 153 lb (69.4 kg)  06/11/12 154 lb (69.854 kg)  06/05/12 156 lb 8 oz (70.988 kg)  05/27/12 156 lb (70.761 kg)  05/13/12 153 lb (69.4 kg)  05/11/12 154 lb 3.2 oz (69.945 kg)  04/24/12 155 lb (70.308 kg)  04/22/12 156 lb (70.761 kg)  04/09/12 153 lb 3.2 oz (69.491 kg)  04/08/12 154 lb (69.854 kg)    Body mass index is 27.10 kg/(m^2).   Labs:  CMP     Component Value Date/Time  NA 137 06/24/2012 0645   NA 140 06/23/2012 1026   K 3.7 06/24/2012 0645   K 3.7 06/23/2012 1026   CL 105 06/24/2012 0645   CL 101 06/23/2012 1026   CO2 24 06/24/2012 0645   CO2 29 06/23/2012 1026   GLUCOSE 101* 06/24/2012 0645   GLUCOSE 111 06/23/2012 1026   BUN 14 06/24/2012 0645   BUN 16 06/23/2012 1026   CREATININE 0.92 06/24/2012 0645   CREATININE 0.9 06/23/2012 1026   CALCIUM 7.8* 06/24/2012 0645   CALCIUM 8.8 06/23/2012 1026   PROT 5.5* 06/24/2012 0645   PROT 6.2* 06/23/2012 1026   ALBUMIN 2.3* 06/24/2012 0645   AST 21 06/24/2012 0645   AST 21  06/23/2012 1026   ALT 11 06/24/2012 0645   ALKPHOS 77 06/24/2012 0645   ALKPHOS 77 06/23/2012 1026   BILITOT 1.4* 06/24/2012 0645   BILITOT 1.10 06/23/2012 1026   GFRNONAA 66* 06/24/2012 0645   GFRAA 77* 06/24/2012 0645    Intake/Output Summary (Last 24 hours) at 06/24/12 1152 Last data filed at 06/24/12 0515  Gross per 24 hour  Intake   1150 ml  Output      0 ml  Net   1150 ml    Last BM - 11/11  Diet Order: General   IVF:    0.9 % NaCl with KCl 20 mEq / L Last Rate: 100 mL/hr at 06/23/12 1525    Estimated Nutritional Needs:   Kcal:1400-1725 Protein:85-105g Fluid:1.4-1.7L  NUTRITION DIAGNOSIS: -Inadequate oral intake (NI-2.1).  Status: Ongoing  RELATED TO: nausea/vomiting  AS EVIDENCE BY: pt statement  MONITORING/EVALUATION(Goals): 1. Resolution of nausea/vomiting 2. Pt to consume >75% of meals  EDUCATION NEEDS: -No education needs identified at this time   Dietitian #: [phone]  DOCUMENTATION CODES Per approved criteria  -Not Applicable    Marshall Cork 06/24/2012, 11:46 AM

## 2012-06-24 NOTE — Progress Notes (Signed)
CRITICAL VALUE ALERT  Critical value received:  WBC 0.7  Date of notification:  06/24/12  Time of notification:  0655  Critical value read back:YES  Nurse who received alert: Jilda Panda  MD notified (1st page):  ENNEVER  Time of first page:  0700  MD notified (2nd page):  Time of second page:  Responding MD:  Myna Hidalgo  Time MD responded:  [phone]

## 2012-06-25 ENCOUNTER — Other Ambulatory Visit: Payer: Medicare Other | Admitting: Lab

## 2012-06-25 ENCOUNTER — Ambulatory Visit: Payer: Medicare Other | Admitting: Hematology & Oncology

## 2012-06-25 ENCOUNTER — Ambulatory Visit: Payer: Medicare Other

## 2012-06-25 DIAGNOSIS — R509 Fever, unspecified: Secondary | ICD-10-CM

## 2012-06-25 DIAGNOSIS — F3289 Other specified depressive episodes: Secondary | ICD-10-CM

## 2012-06-25 DIAGNOSIS — R5381 Other malaise: Secondary | ICD-10-CM

## 2012-06-25 DIAGNOSIS — R5383 Other fatigue: Secondary | ICD-10-CM

## 2012-06-25 DIAGNOSIS — F329 Major depressive disorder, single episode, unspecified: Secondary | ICD-10-CM

## 2012-06-25 LAB — TYPE AND SCREEN

## 2012-06-25 LAB — COMPREHENSIVE METABOLIC PANEL
AST: 18 U/L (ref 0–37)
Albumin: 2.1 g/dL — ABNORMAL LOW (ref 3.5–5.2)
Alkaline Phosphatase: 77 U/L (ref 39–117)
BUN: 14 mg/dL (ref 6–23)
Chloride: 110 mEq/L (ref 96–112)
Potassium: 3.4 mEq/L — ABNORMAL LOW (ref 3.5–5.1)
Sodium: 139 mEq/L (ref 135–145)
Total Bilirubin: 0.6 mg/dL (ref 0.3–1.2)
Total Protein: 5.4 g/dL — ABNORMAL LOW (ref 6.0–8.3)

## 2012-06-25 LAB — CBC
MCV: 89.5 fL (ref 78.0–100.0)
Platelets: 48 10*3/uL — ABNORMAL LOW (ref 150–400)
RBC: 3.33 MIL/uL — ABNORMAL LOW (ref 3.87–5.11)
RDW: 15.3 % (ref 11.5–15.5)
WBC: 1.3 10*3/uL — CL (ref 4.0–10.5)

## 2012-06-25 LAB — PREALBUMIN: Prealbumin: <3 mg/dL — ABNORMAL LOW (ref 17.0–34.0)

## 2012-06-25 MED ORDER — BISMUTH SUBSALICYLATE 262 MG/15ML PO SUSP
30.0000 mL | ORAL | Status: DC | PRN
  Administered 2012-06-25: 30 mL via ORAL
  Filled 2012-06-25: qty 236

## 2012-06-25 MED ORDER — PANTOPRAZOLE SODIUM 40 MG PO TBEC
40.0000 mg | DELAYED_RELEASE_TABLET | Freq: Every day | ORAL | Status: DC
Start: 2012-06-25 — End: 2012-06-27
  Administered 2012-06-25 – 2012-06-27 (×3): 40 mg via ORAL
  Filled 2012-06-25 (×3): qty 1

## 2012-06-25 MED ORDER — MIRTAZAPINE 15 MG PO TABS
15.0000 mg | ORAL_TABLET | Freq: Every day | ORAL | Status: DC
  Administered 2012-06-25 – 2012-06-26 (×2): 15 mg via ORAL
  Filled 2012-06-25 (×4): qty 1

## 2012-06-25 NOTE — Progress Notes (Signed)
Patient requested prayer. Prayed. She started to cry. Asked her about the tears, she said "they are tears of joy." Will follow up. Presence; prayer; listening.   06/25/12 1000  Clinical Encounter Type  Visited With Patient  Visit Type Spiritual support  Recommendations Follow up  Spiritual Encounters  Spiritual Needs Prayer

## 2012-06-25 NOTE — Evaluation (Signed)
Physical Therapy Evaluation Patient Details Name: [name] MRN: [id number] DOB: August 03, 1951 Today's Date: 06/25/2012 Time: 6213-0865 PT Time Calculation (min): 14 min  PT Assessment / Plan / Recommendation Clinical Impression  61 yo with metastatic Ca with weakness and decreased motor coordination  post chemo treatment.  She will benefit from continued PT, possibly at [facility] or HHPTat discharge to optimize functional independence    PT Assessment  Patient needs continued PT services    Follow Up Recommendations  CIR;Home health PT    Does the patient have the potential to tolerate intense rehabilitation      Barriers to Discharge        Equipment Recommendations  Rolling walker with 5" wheels;3 in 1 bedside comode    Recommendations for Other Services Rehab consult;OT consult   Frequency Min 3X/week    Precautions / Restrictions Precautions Precautions: Fall Precaution Comments: pt with weakness and incoordination in legs Restrictions Weight Bearing Restrictions: No   Pertinent Vitals/Pain No c/o pain      Mobility  Bed Mobility Bed Mobility: Rolling Right;Rolling Left Rolling Right: 4: Min assist Rolling Left: 4: Min assist Details for Bed Mobility Assistance: incoordinated movement patterns Transfers Transfers: Sit to Stand;Stand to Sit Sit to Stand: 1: +2 Total assist Sit to Stand: Patient Percentage: 70% Stand to Sit: 1: +2 Total assist Stand to Sit: Patient Percentage: 70% Ambulation/Gait Ambulation/Gait Assistance: 1: +2 Total assist Ambulation/Gait: Patient Percentage: 70% Ambulation Distance (Feet): 3 Feet Assistive device: Rolling walker Ambulation/Gait Assistance Details: pt was able to stand and move feet to chair, but appreared to be at hight risk for knees to buckle and fall. Gait Pattern: Trunk flexed;Step-to pattern;Decreased step length - right;Decreased step length - left;Ataxic Gait velocity: decreased General  Gait Details: Decreased motor contol with gait attempt Stairs: No Wheelchair Mobility Wheelchair Mobility: No    Shoulder Instructions     Exercises     PT Diagnosis: Difficulty walking;Generalized weakness;Abnormality of gait;Altered mental status  PT Problem List: Decreased strength;Decreased activity tolerance;Decreased balance;Decreased mobility;Decreased coordination;Decreased cognition;Decreased safety awareness;Decreased knowledge of precautions PT Treatment Interventions: Gait training;Functional mobility training;Therapeutic activities;Therapeutic exercise;Balance training;Patient/family education   PT Goals Acute Rehab PT Goals PT Goal Formulation: With patient Time For Goal Achievement: 07/09/12 Potential to Achieve Goals: Good Pt will go Supine/Side to Sit: with min assist PT Goal: Supine/Side to Sit - Progress: Goal set today Pt will go Sit to Supine/Side: with min assist PT Goal: Sit to Supine/Side - Progress: Goal set today Pt will go Sit to Stand: with min assist PT Goal: Sit to Stand - Progress: Goal set today Pt will go Stand to Sit: with min assist PT Goal: Stand to Sit - Progress: Goal set today Pt will Ambulate: 16 - 50 feet;with least restrictive assistive device;with min assist PT Goal: Ambulate - Progress: Goal set today Pt will Perform Home Exercise Program: with supervision, verbal cues required/provided PT Goal: Perform Home Exercise Program - Progress: Goal set today  Visit Information  Last PT Received On: 06/25/12 Assistance Needed: +2    Subjective Data  Subjective: "my legs and my hands are weak" Patient Stated Goal: to return home   Prior Functioning  Home Living Lives With: Spouse Available Help at Discharge: Family Type of Home: House Home Access: Level entry Home Layout: One level Home Adaptive Equipment: Wheelchair - powered Prior Function Level of Independence: Independent with assistive device(s) (limited endurance) Needs  Assistance: Gait;Transfers Gait Assistance: pt needs to use a scooter for  any distance walking Comments: pt reports she has gotten significantly weaker since chemotherapy last week Communication Communication: No difficulties    Cognition  Overall Cognitive Status: Impaired Area of Impairment: Awareness of deficits;Awareness of errors;Other (comment) (Question accuracy of response to questions) Arousal/Alertness: Lethargic Orientation Level: Other (comment) (difficult to get clear history about prior mobiity) Behavior During Session: Flat affect Awareness of Errors: Assistance required to identify errors made;Assistance required to correct errors made Awareness of Errors - Other Comments: pt with delayed response to commands Awareness of Deficits: pt does not seem to realize extent of deficts and need for continued rehab Cognition - Other Comments: Question judgement    Extremity/Trunk Assessment Right Lower Extremity Assessment RLE ROM/Strength/Tone: Deficits RLE ROM/Strength/Tone Deficits: ROM intact Pt is able to move leg against gravity, but effort is incoordinated, strength difficult to isometrically test due to difficulty with commands RLE Sensation: History of peripheral neuropathy (variable  responses to testing ?? CIPN) RLE Coordination: Deficits RLE Coordination Deficits: impaired control of movement Left Lower Extremity Assessment LLE ROM/Strength/Tone: Deficits LLE ROM/Strength/Tone Deficits: ROM intact Pt is able to move leg against gravity, but effort is incoordinated, strength difficult to isometrically test due to difficulty with commands LLE Sensation: History of peripheral neuropathy (??CIPN) LLE Coordination: Deficits LLE Coordination Deficits: impaired control of movement Trunk Assessment Trunk Assessment: Normal   Balance Balance Balance Assessed: Yes Static Sitting Balance Static Sitting - Balance Support: No upper extremity supported Static Sitting - Level of  Assistance: 5: Stand by assistance Static Sitting - Comment/# of Minutes: ~3  Pt needed a few seconds to achieve midline balance, but then was able to maintain Static Standing Balance Static Standing - Balance Support: Bilateral upper extremity supported Static Standing - Level of Assistance: 1: +2 Total assist Static Standing - Comment/# of Minutes: pt pushing up on arms on RW to maintain standing  End of Session    GP    Waleska Buttery K. Manson Pas[place], [place] [phone] 06/25/2012, 10:27 AM

## 2012-06-25 NOTE — Progress Notes (Signed)
Rehab Admissions Coordinator Note:  Patient was screened by Brock Ra for appropriateness for an Inpatient Acute Rehab Consult.  There is concern about pt's tolerance for CIR.    Spoke w/ PT, who recommends CIR consider pt, therefore, we are recommending Inpatient Rehab consult.    Brock Ra 06/25/2012, 1:46 PM  I can be reached at [phone].

## 2012-06-25 NOTE — Progress Notes (Signed)
She had another temperature last night. So far, her cultures are negative. Russell count has come up to 1.3. She was started on vancomycin. She was already on Maxipime.  She is somewhat emotional. She feels tired. And weak. This could be reflective of her low white cell count, chemotherapy, and possibly her underlying cancer. We will see about some physical therapy.  She is on Neupogen. We will continue this until her white cell count gets above 2000.  She felt that she ate a little bit better yesterday today. She is not complaining of any pain. There is no cough or shortness of breath.   Her vital signs show temperature 98.9 blood pressure 116/66 pulse is 95. Oral exam shows no mucositis. No thrush is noted. Lungs are clear. Cardiac exam regular rate and rhythm with no murmurs rubs or bruits abdominal exam soft. Bowel sounds slightly decreased. No guarding or rebound tenderness. Extremities shows no clubbing cyanosis or edema.  We still need to support her. I will add a anti-depressant. Hopefully this will help her little bit.  We will continue antibiotics for right now.  Physical therapy to see if we can get her a little bit more mobile.  Pete E.  Psalm 27:1

## 2012-06-26 DIAGNOSIS — R197 Diarrhea, unspecified: Secondary | ICD-10-CM

## 2012-06-26 LAB — CBC
HCT: 25.6 % — ABNORMAL LOW (ref 36.0–46.0)
MCH: 31.1 pg (ref 26.0–34.0)
MCHC: 34.4 g/dL (ref 30.0–36.0)
MCV: 90.5 fL (ref 78.0–100.0)
Platelets: 51 10*3/uL — ABNORMAL LOW (ref 150–400)
RDW: 15.3 % (ref 11.5–15.5)

## 2012-06-26 LAB — COMPREHENSIVE METABOLIC PANEL
AST: 12 U/L (ref 0–37)
Albumin: 1.6 g/dL — ABNORMAL LOW (ref 3.5–5.2)
BUN: 9 mg/dL (ref 6–23)
Calcium: 7.2 mg/dL — ABNORMAL LOW (ref 8.4–10.5)
Chloride: 118 mEq/L — ABNORMAL HIGH (ref 96–112)
Creatinine, Ser: 0.85 mg/dL (ref 0.50–1.10)
Total Bilirubin: 0.3 mg/dL (ref 0.3–1.2)

## 2012-06-26 LAB — BASIC METABOLIC PANEL
BUN: 10 mg/dL (ref 6–23)
Chloride: 112 mEq/L (ref 96–112)
GFR calc Af Amer: 68 mL/min — ABNORMAL LOW (ref >90)
GFR calc non Af Amer: 59 mL/min — ABNORMAL LOW (ref >90)
Glucose, Bld: 113 mg/dL — ABNORMAL HIGH (ref 70–99)
Potassium: 3.3 mEq/L — ABNORMAL LOW (ref 3.5–5.1)
Sodium: 141 mEq/L (ref 135–145)

## 2012-06-26 MED ORDER — SODIUM CHLORIDE 0.9 % IV SOLN
INTRAVENOUS | Status: DC
  Administered 2012-06-26 – 2012-06-27 (×2): via INTRAVENOUS

## 2012-06-26 NOTE — Progress Notes (Signed)
Physical Therapy Treatment Patient Details Name: [name] MRN: [id number] DOB: [birth year] Today's Date: 06/26/2012 Time: 4098-1191 PT Time Calculation (min): 40 min  PT Assessment / Plan / Recommendation Comments on Treatment Session  Pt improved today, but still with unsteady ataxic gait.  She will require use of scooter to enter home and 24/7 assist from famiily for hands on balance assist while using walker. She needs HHPT or short term post acute rehab if family cannot provide assistance for patient    Follow Up Recommendations  Home health PT;SNF     Does the patient have the potential to tolerate intense rehabilitation     Barriers to Discharge        Equipment Recommendations  Rolling walker with 5" wheels    Recommendations for Other Services    Frequency Min 3X/week   Plan Discharge plan needs to be updated;Frequency remains appropriate    Precautions / Restrictions Precautions Precautions: Fall Precaution Comments: pt with weakness and incoordination in legs Restrictions Weight Bearing Restrictions: No   Pertinent Vitals/Pain No c/o pain    Mobility  Bed Mobility Bed Mobility: Rolling Right;Rolling Left Rolling Right: 4: Min assist Rolling Left: 4: Min assist Details for Bed Mobility Assistance: incoordinated movement patterns needs assist to bring legs off of bed Transfers Transfers: Sit to Stand;Stand to Sit Sit to Stand: 4: Min assist Stand to Sit: 4: Min assist Stand to Sit: Patient Percentage: 70% Details for Transfer Assistance: Pt needs consistent verbal cues to push up on armrests to stand and to reach back. she chooses to stand/sit down without Ambulation/Gait Ambulation/Gait Assistance: 1: +2 Total assist;3: Mod assist Ambulation Distance (Feet): 400 Feet (25,25,25,25,100,100,100) Assistive device: Rolling walker Ambulation/Gait Assistance Details: pt needs consistent cues fo trunk extension and stability.  She has episodes of knees buckling  including one episode that required PT to hold pt up with giat belt as knees buckled. Cues to keep normal width of base of support as well  Gait Pattern: Trunk flexed;Decreased step length - right;Decreased step length - left;Ataxic;Step-through pattern;Narrow base of support Gait velocity: decreased General Gait Details: Gait ataxic with loss of balance and knee buckling.  Pt improved with PT emphasis, but she still requires moderate assist and chair pulled up behind her for safety.  Pt lack standing fucntional reserve to maintain stability while environment is prepared for her to sit. Stairs: No Wheelchair Mobility Wheelchair Mobility: No    Exercises     PT Diagnosis:    PT Problem List:   PT Treatment Interventions:     PT Goals Acute Rehab PT Goals PT Goal Formulation: With patient Time For Goal Achievement: 07/09/12 Potential to Achieve Goals: Good Pt will go Supine/Side to Sit: with min assist PT Goal: Supine/Side to Sit - Progress: Met Pt will go Sit to Supine/Side: with min assist PT Goal: Sit to Supine/Side - Progress: Met Pt will go Sit to Stand: with min assist PT Goal: Sit to Stand - Progress: Met Pt will go Stand to Sit: with min assist PT Goal: Stand to Sit - Progress: Met Pt will Ambulate: 16 - 50 feet;with least restrictive assistive device;with min assist PT Goal: Ambulate - Progress: Progressing toward goal Pt will Perform Home Exercise Program: with supervision, verbal cues required/provided PT Goal: Perform Home Exercise Program - Progress: Progressing toward goal  Visit Information  Last PT Received On: 06/26/12 Assistance Needed: +2    Subjective Data  Subjective: I guess my husband can help me with some  of this Patient Stated Goal: to go home   Cognition  Overall Cognitive Status: Appears within functional limits for tasks assessed/performed Area of Impairment: Awareness of deficits;Awareness of errors;Other (comment) (Question accuracy of response to  questions) Arousal/Alertness: Awake/alert Orientation Level: Other (comment) (Question accuracy of  pt responses. ) Behavior During Session: Flat affect Awareness of Errors: Assistance required to identify errors made;Assistance required to correct errors made Awareness of Errors - Other Comments: pt able to respond to commands better today Awareness of Deficits: pt does not seem to realize extent of deficts and need for continued rehab Cognition - Other Comments: Question judgement    Balance  Static Sitting Balance Static Sitting - Balance Support: No upper extremity supported Static Sitting - Level of Assistance: 7: Independent Static Standing Balance Static Standing - Balance Support: Bilateral upper extremity supported Static Standing - Level of Assistance: 6: Modified independent (Device/Increase time) Static Standing - Comment/# of Minutes: 3  Emphasis placed on core activation and trunk stability  End of Session PT - End of Session Equipment Utilized During Treatment: Gait belt Activity Tolerance: Patient tolerated treatment well Patient left: in chair Nurse Communication: Mobility status   GP     Bayard Hugger. [place], [place]  [phone] 06/26/2012, 4:07 PM

## 2012-06-26 NOTE — Progress Notes (Signed)
[name] is now having a little diarrhea. I suspect that this from her antibiotics. Since her cultures are negative. I'll stop the antibiotics. Her white cell count is 3.0.  Her potassium is up today. I'll stop the potassium in her IV fluids.  Her husband says r that she is eating well. There is no nausea vomiting.  She wants to go to go home. I don't think that she needs inpatient rehabilitation. I do appreciate physical therapies help.  She is a low but more alert today. There is no cough. There is no obvious bleeding.  We are  awaiting the results of her stool for C. difficile. Hopefully this is negative.  Her vital signs are stable. Blood pressure 156/97. Temperature 98.7. Lungs are clear. Cardiac exam regular rate and rhythm with no murmurs rubs or bruits. Abdominal exam is soft. Bowel sounds are slightly decreased. Neurological exam shows no focal neurological deficits.  Again, we will stop her IV antibiotics. The temperature probably is from her underlying malignancy.  We will see how she does today. Hopefully we might be able to consider discharge tomorrow.   Pete E.

## 2012-06-27 DIAGNOSIS — C7800 Secondary malignant neoplasm of unspecified lung: Secondary | ICD-10-CM

## 2012-06-27 LAB — BASIC METABOLIC PANEL
BUN: 10 mg/dL (ref 6–23)
CO2: 22 mEq/L (ref 19–32)
Glucose, Bld: 85 mg/dL (ref 70–99)
Potassium: 3 mEq/L — ABNORMAL LOW (ref 3.5–5.1)
Sodium: 142 mEq/L (ref 135–145)

## 2012-06-27 LAB — CBC
HCT: 28.4 % — ABNORMAL LOW (ref 36.0–46.0)
Hemoglobin: 9.7 g/dL — ABNORMAL LOW (ref 12.0–15.0)
MCH: 30.8 pg (ref 26.0–34.0)
RBC: 3.15 MIL/uL — ABNORMAL LOW (ref 3.87–5.11)

## 2012-06-27 MED ORDER — METOPROLOL SUCCINATE ER 50 MG PO TB24: 50.0000 mg | ORAL_TABLET | Freq: Every day | ORAL | Status: DC

## 2012-06-27 MED ORDER — MIRTAZAPINE 15 MG PO TABS: 15.0000 mg | ORAL_TABLET | Freq: Every day | ORAL | Status: DC

## 2012-06-27 MED ORDER — POTASSIUM CHLORIDE 10 MEQ/50ML IV SOLN
10.0000 meq | INTRAVENOUS | Status: AC
  Administered 2012-06-27 (×4): 10 meq via INTRAVENOUS
  Filled 2012-06-27 (×4): qty 50

## 2012-06-27 NOTE — Discharge Summary (Signed)
[name], Sherry NO.:  [id number]  MEDICAL RECORD NO.:  [id number]  LOCATION:  1337                         FACILITY:  [facility]  PHYSICIAN:  [name], M.D.  DATE OF BIRTH:  July 13, 1951  DATE OF ADMISSION:  06/23/2012 DATE OF DISCHARGE:  06/27/2012                              DISCHARGE SUMMARY   DIAGNOSES UPON DISCHARGE: 1. Febrile neutropenia. 2. Dehydration secondary to nausea and vomiting. 3. Metastatic cholangiocarcinoma. 4. Hypertension. 5. Anemia, status post transfusion. 6. Hypokalemia.  CONDITION UPON DISCHARGE:  Stable.  ACTIVITIES:  As tolerated.  DIET:  Without restrictions.  FOLLOWUP:  The patient will follow up with Dr. Myna Hidalgo at the [facility] on the 21st as scheduled.  MEDICATIONS UPON DISCHARGE: 1. Folic acid 1 mg p.o. daily. 2. Neurontin 600 mg p.o. t.i.d. 3. Ativan 0.5 mg q.8 hours p.r.n. nausea. 4. Reglan 10 mg p.o. q.6 hours p.r.n. nausea. 5. Metoprolol XL 50 mg p.o. daily. 6. Remeron 15 mg p.o. daily. 7. Prilosec 20 mg p.o. b.i.d. 8. OxyIR 5 mg p.o. q.6 hours p.r.n. 9. Carafate elixir 10 mL q.6 h.  HOSPITAL COURSE:  [name] was admitted on the 12th from the office. She was dehydrated.  She had nausea and vomiting.  She had lot of abdominal pain.  She was neutropenic.  We admitted her.  All cultures were taken.  These were all negative. She had a chest x-ray, which was unremarkable.  We did start her on empiric antibiotics with Maxipime.  She had got Neupogen while in the hospital.  She was transfused while in the hospital.  She got 2 units of blood. Her hemoglobin was 8.4.  The patient did have some temperatures while in the hospital.  I thought the temperatures may have been from the underlying malignancy.  When all cultures came back negative and her white cell count got above 1.3, we went ahead and stopped the antibiotics.  The patient continued her Neupogen until her white cell count  got above 3.  On the day of discharge, her white cell count was 7.1, hemoglobin 9.7, and platelet count was 63.  She did get some IV potassium while in the hospital.  She began to eat a bit better.  There was maybe some element of depression.  I did start her on Remeron to try to help with her depression and also for her appetite.  She got physical therapy while in the hospital.  She felt well on the 16th.  She is afebrile.  Her blood pressure was 151/87.  I did readjust her blood pressure medicines a little bit.  I also stopped her cholesterol medicine given that she has metastatic disease.  I felt that she could go home.  I did give her some potassium before her discharge.  PHYSICAL EXAMINATION UPON DISCHARGE:  VITAL SIGNS:  Her vital signs showed temperature 98.6, pulse 83, respiratory rate 20, blood pressure 151/87, oxygen saturation was 97%. HEENT:  Head and neck exam showed normocephalic, atraumatic skull.  No ocular or oral lesions. NECK:  No palpable cervical or supraclavicular lymph nodes. LUNGS:  Clear bilaterally. CARDIAC:  Regular rate and rhythm with normal  S1, S2.  No murmurs, rubs, or bruits. ABDOMEN:  Soft.  She has better bowel sounds.  There is no fluid wave. There is no palpable hepatosplenomegaly. EXTREMITIES:  Shows no clubbing, cyanosis, or edema. NEUROLOGICAL:  Shows no focal neurological deficits.     [name], M.D.     PRE/MEDQ  D:  06/27/2012  T:  06/27/2012  Job:  [id number]

## 2012-06-27 NOTE — Care Management Note (Signed)
    Page 1 of 1   06/27/2012     12:18:39 PM   CARE MANAGEMENT NOTE 06/27/2012  Patient:  [name], [name]   Account Number:  [id number]  Date Initiated:  06/27/2012  Documentation initiated by:  Tera Mater  Subjective/Objective Assessment:   61yo female admitted with nausea and vomitting.  Pt. lives at home with spouse.     Action/Plan:   Discharge planning for DME rolling walker   Anticipated DC Date:  06/27/2012   Anticipated DC Plan:  HOME/SELF CARE      DC Planning Services  CM consult      PAC Choice  DURABLE MEDICAL EQUIPMENT   Choice offered to / List presented to:     DME arranged  Levan Hurst      DME agency  [facility]        Status of service:  Completed, signed off Medicare Important Message given?   (If response is "NO", the following Medicare IM given date fields will be blank) Date Medicare IM given:   Date Additional Medicare IM given:    Discharge Disposition:  HOME W HOME HEALTH SERVICES  Per UR Regulation:  Reviewed for med. necessity/level of care/duration of stay  If discussed at Long Length of Stay Meetings, dates discussed:    Comments:  06/27/12 1215 TC to [place], with [facility] to order DME rolling walker to pt.   He stated he would deliever walker within the hour.  Pt. to dc home today. Tera Mater, RN, BSN NCM

## 2012-06-27 NOTE — Progress Notes (Signed)
06/27/12 1615 Noted new order for [facility] PT.  Fred, RN, taking care of pt. stated that the pt. had said she used [facility] in past and was satisfied with their care.  Faxed discharge summary, PT notes, and HH orders to [facility] [phone]).  Tera Mater, RN, BSN NCM

## 2012-06-27 NOTE — Discharge Summary (Signed)
D/C summary #[id number] is note.

## 2012-06-29 LAB — CULTURE, BLOOD (ROUTINE X 2)
Culture: NO GROWTH
Culture: NO GROWTH

## 2012-07-01 ENCOUNTER — Other Ambulatory Visit: Payer: Self-pay | Admitting: *Deleted

## 2012-07-01 NOTE — Progress Notes (Signed)
Pt's husband called (and later the pt) requesting to have her chemo moved to after Thanksgiving. She is feeling weak in her legs and has periods of nausea and diarrhea upon which she takes her PRN meds with moderate relief. They stated she just doesn't want to feel worse over the holiday. Reviewed with Dr Myna Hidalgo. Ok to move appt to the week after Thanksgiving on a Thursday (12/5) as requested. Rick to call her with the appt times.

## 2012-07-02 ENCOUNTER — Other Ambulatory Visit: Payer: Medicare Other | Admitting: Lab

## 2012-07-02 ENCOUNTER — Ambulatory Visit: Payer: Medicare Other

## 2012-07-02 ENCOUNTER — Ambulatory Visit: Payer: Medicare Other | Admitting: Medical

## 2012-07-16 ENCOUNTER — Other Ambulatory Visit (HOSPITAL_BASED_OUTPATIENT_CLINIC_OR_DEPARTMENT_OTHER): Payer: Medicare Other | Admitting: Lab

## 2012-07-16 ENCOUNTER — Ambulatory Visit (HOSPITAL_BASED_OUTPATIENT_CLINIC_OR_DEPARTMENT_OTHER): Payer: Medicare Other | Admitting: Medical

## 2012-07-16 ENCOUNTER — Other Ambulatory Visit: Payer: Medicare Other | Admitting: Lab

## 2012-07-16 ENCOUNTER — Ambulatory Visit (HOSPITAL_BASED_OUTPATIENT_CLINIC_OR_DEPARTMENT_OTHER): Payer: Medicare Other

## 2012-07-16 ENCOUNTER — Ambulatory Visit: Payer: Medicare Other

## 2012-07-16 VITALS — BP 141/87 | HR 69 | Temp 98.5°F | Resp 16 | Ht 63.0 in | Wt 151.0 lb

## 2012-07-16 DIAGNOSIS — C221 Intrahepatic bile duct carcinoma: Secondary | ICD-10-CM

## 2012-07-16 DIAGNOSIS — C7800 Secondary malignant neoplasm of unspecified lung: Secondary | ICD-10-CM

## 2012-07-16 DIAGNOSIS — T451X5A Adverse effect of antineoplastic and immunosuppressive drugs, initial encounter: Secondary | ICD-10-CM

## 2012-07-16 DIAGNOSIS — Z5111 Encounter for antineoplastic chemotherapy: Secondary | ICD-10-CM

## 2012-07-16 DIAGNOSIS — D696 Thrombocytopenia, unspecified: Secondary | ICD-10-CM

## 2012-07-16 LAB — CMP (CANCER CENTER ONLY)
BUN, Bld: 11 mg/dL (ref 7–22)
CO2: 29 mEq/L (ref 18–33)
Creat: 0.8 mg/dl (ref 0.6–1.2)
Glucose, Bld: 86 mg/dL (ref 73–118)
Sodium: 141 mEq/L (ref 128–145)
Total Bilirubin: 0.8 mg/dl (ref 0.20–1.60)
Total Protein: 6.9 g/dL (ref 6.4–8.1)

## 2012-07-16 LAB — CBC WITH DIFFERENTIAL (CANCER CENTER ONLY)
BASO#: 0 10*3/uL (ref 0.0–0.2)
Eosinophils Absolute: 0.1 10*3/uL (ref 0.0–0.5)
HCT: 30.8 % — ABNORMAL LOW (ref 34.8–46.6)
HGB: 10.1 g/dL — ABNORMAL LOW (ref 11.6–15.9)
LYMPH%: 14.6 % (ref 14.0–48.0)
MCH: 31.7 pg (ref 26.0–34.0)
MCV: 97 fL (ref 81–101)
MONO#: 0.8 10*3/uL (ref 0.1–0.9)
NEUT%: 68.6 % (ref 39.6–80.0)
RBC: 3.19 10*6/uL — ABNORMAL LOW (ref 3.70–5.32)
WBC: 5.4 10*3/uL (ref 3.9–10.0)

## 2012-07-16 MED ORDER — SODIUM CHLORIDE 0.9 % IV SOLN
Freq: Once | INTRAVENOUS | Status: AC
  Administered 2012-07-16: 12:00:00 via INTRAVENOUS

## 2012-07-16 MED ORDER — SODIUM CHLORIDE 0.9 % IJ SOLN
10.0000 mL | INTRAMUSCULAR | Status: DC | PRN
  Administered 2012-07-16: 10 mL
  Filled 2012-07-16: qty 10

## 2012-07-16 MED ORDER — HEPARIN SOD (PORK) LOCK FLUSH 100 UNIT/ML IV SOLN
500.0000 [IU] | Freq: Once | INTRAVENOUS | Status: AC | PRN
  Administered 2012-07-16: 500 [IU]
  Filled 2012-07-16: qty 5

## 2012-07-16 MED ORDER — DEXAMETHASONE SODIUM PHOSPHATE 10 MG/ML IJ SOLN
10.0000 mg | Freq: Once | INTRAMUSCULAR | Status: AC
Start: 2012-07-16 — End: 2012-07-16
  Administered 2012-07-16: 10 mg via INTRAVENOUS

## 2012-07-16 MED ORDER — ONDANSETRON 8 MG/50ML IVPB (CHCC)
8.0000 mg | Freq: Once | INTRAVENOUS | Status: AC
  Administered 2012-07-16: 8 mg via INTRAVENOUS

## 2012-07-16 MED ORDER — PACLITAXEL PROTEIN-BOUND CHEMO INJECTION 100 MG
90.0000 mg/m2 | Freq: Once | INTRAVENOUS | Status: AC
  Administered 2012-07-16: 150 mg via INTRAVENOUS
  Filled 2012-07-16: qty 30

## 2012-07-16 NOTE — Patient Instructions (Addendum)
Docetaxel injection What is this medicine? DOCETAXEL (doe se TAX el) is a chemotherapy drug. It targets fast dividing cells, like cancer cells, and causes these cells to die. This medicine is used to treat many types of cancers like breast cancer, certain stomach cancers, head and neck cancer, lung cancer, and prostate cancer. This medicine may be used for other purposes; ask your health care provider or pharmacist if you have questions. What should I tell my health care provider before I take this medicine? They need to know if you have any of these conditions: -infection (especially a virus infection such as chickenpox, cold sores, or herpes) -liver disease -low blood counts, like low white cell, platelet, or red cell counts -an unusual or allergic reaction to docetaxel, polysorbate 80, other chemotherapy agents, other medicines, foods, dyes, or preservatives -pregnant or trying to get pregnant -breast-feeding How should I use this medicine? This drug is given as an infusion into a vein. It is administered in a hospital or clinic by a specially trained health care professional. Talk to your pediatrician regarding the use of this medicine in children. Special care may be needed. Overdosage: If you think you have taken too much of this medicine contact a poison control center or emergency room at once. NOTE: This medicine is only for you. Do not share this medicine with others. What if I miss a dose? It is important not to miss your dose. Call your doctor or health care professional if you are unable to keep an appointment. What may interact with this medicine? -cyclosporine -erythromycin -ketoconazole -medicines to increase blood counts like filgrastim, pegfilgrastim, sargramostim -vaccines Talk to your doctor or health care professional before taking any of these medicines: -acetaminophen -aspirin -ibuprofen -ketoprofen -naproxen This list may not describe all possible interactions.  Give your health care provider a list of all the medicines, herbs, non-prescription drugs, or dietary supplements you use. Also tell them if you smoke, drink alcohol, or use illegal drugs. Some items may interact with your medicine. What should I watch for while using this medicine? Your condition will be monitored carefully while you are receiving this medicine. You will need important blood work done while you are taking this medicine. This drug may make you feel generally unwell. This is not uncommon, as chemotherapy can affect healthy cells as well as cancer cells. Report any side effects. Continue your course of treatment even though you feel ill unless your doctor tells you to stop. In some cases, you may be given additional medicines to help with side effects. Follow all directions for their use. Call your doctor or health care professional for advice if you get a fever, chills or sore throat, or other symptoms of a cold or flu. Do not treat yourself. This drug decreases your body's ability to fight infections. Try to avoid being around people who are sick. This medicine may increase your risk to bruise or bleed. Call your doctor or health care professional if you notice any unusual bleeding. Be careful brushing and flossing your teeth or using a toothpick because you may get an infection or bleed more easily. If you have any dental work done, tell your dentist you are receiving this medicine. Avoid taking products that contain aspirin, acetaminophen, ibuprofen, naproxen, or ketoprofen unless instructed by your doctor. These medicines may hide a fever. Do not become pregnant while taking this medicine. Women should inform their doctor if they wish to become pregnant or think they might be pregnant. There is  a potential for serious side effects to an unborn child. Talk to your health care professional or pharmacist for more information. Do not breast-feed an infant while taking this medicine. What  side effects may I notice from receiving this medicine? Side effects that you should report to your doctor or health care professional as soon as possible: -allergic reactions like skin rash, itching or hives, swelling of the face, lips, or tongue -low blood counts - This drug may decrease the number of white blood cells, red blood cells and platelets. You may be at increased risk for infections and bleeding. -signs of infection - fever or chills, cough, sore throat, pain or difficulty passing urine -signs of decreased platelets or bleeding - bruising, pinpoint red spots on the skin, black, tarry stools, nosebleeds -signs of decreased red blood cells - unusually weak or tired, fainting spells, lightheadedness -breathing problems -fast or irregular heartbeat -low blood pressure -mouth sores -nausea and vomiting -pain, swelling, redness or irritation at the injection site -pain, tingling, numbness in the hands or feet -swelling of the ankle, feet, hands -weight gain Side effects that usually do not require medical attention (report to your prescriber or health care professional if they continue or are bothersome): -bone pain -complete hair loss including hair on your head, underarms, pubic hair, eyebrows, and eyelashes -diarrhea -excessive tearing -changes in the color of fingernails -loosening of the fingernails -nausea -muscle pain -red flush to skin -sweating -weak or tired This list may not describe all possible side effects. Call your doctor for medical advice about side effects. You may report side effects to FDA at 1-800-FDA-1088. Where should I keep my medicine? This drug is given in a hospital or clinic and will not be stored at home. NOTE: This sheet is a summary. It may not cover all possible information. If you have questions about this medicine, talk to your doctor, pharmacist, or health care provider.  2012, [employer]. (07/11/2008 11:52:10 AM)

## 2012-07-16 NOTE — Progress Notes (Signed)
Diagnosis: Metastatic cholangiocarcinoma.  Past therapy: Avastin/Tarceva.  Current therapy: #1 she is status post her first cycle of Abraxane (2 weeks on/one-week off).  Interim history: [name] presents today for an office followup visit.  She is status post her first cycle of Abraxane.  Unfortunately, she did have to be admitted to the hospital.  Back on 06/23/2012 secondary to febrile neutropenia, dehydration, secondary to nausea, and vomiting, anemia, and hypokalemia.  All her cultures were negative.  She did have a chest x-ray, which was unremarkable.  She was started on empiric antibiotics with Maxipime.  She did get Neupogen while in the hospital.  She was also transfused with 2 units of packed red blood cells.  She also received IV potassium.  Throughout her course in the hospital, she did markedly improve.  She is here today and reports, that she's doing much better.  She reports, that she has a decent appetite.  She denies any nausea, vomiting, diarrhea, constipation.  She denies any chest pain, shortness of breath, or cough.  She denies any abdominal pain.  She denies any fevers, chills, night sweats, or palpable neuropathy.  She denies any obvious, or abnormal bleeding.  She denies any headaches, visual changes, or rashes.  She is ready to proceed with her day 1 of cycle 2 of Abraxane today.  Review of Systems: Constitutional:Negative for malaise/fatigue, fever, chills, weight loss, diaphoresis, activity change, appetite change, and unexpected weight change.  HEENT: Negative for double vision, blurred vision, visual loss, ear pain, tinnitus, congestion, rhinorrhea, epistaxis sore throat or sinus disease, oral pain/lesion, tongue soreness Respiratory: Negative for cough, chest tightness, shortness of breath, wheezing and stridor.  Cardiovascular: Negative for chest pain, palpitations, leg swelling, orthopnea, PND, DOE or claudication Gastrointestinal: Negative for nausea, vomiting,  abdominal pain, diarrhea, constipation, blood in stool, melena, hematochezia, abdominal distention, anal bleeding, rectal pain, anorexia and hematemesis.  Genitourinary: Negative for dysuria, frequency, hematuria,  Musculoskeletal: Negative for myalgias, back pain, joint swelling, arthralgias and gait problem.  Skin: Negative for rash, color change, pallor and wound.  Neurological:. Negative for dizziness/light-headedness, tremors, seizures, syncope, facial asymmetry, speech difficulty, weakness, numbness, headaches and paresthesias.  Hematological: Negative for adenopathy. Does not bruise/bleed easily.  Psychiatric/Behavioral:  Negative for depression, no loss of interest in normal activity or change in sleep pattern.   Physical Exam: This is a 61 year old, well-developed, well-nourished African American female, in no obvious distress Vitals: Temperature 90.0 degrees, pulse 69, respirations 16, blood pressure 141/87, weight 151 pounds HEENT reveals a normocephalic, atraumatic skull, no scleral icterus, no oral lesions  Neck is supple without any cervical or supraclavicular adenopathy.  Lungs are clear to auscultation bilaterally. There are no wheezes, rales or rhonci Cardiac is regular rate and rhythm with a normal S1 and S2. There are no murmurs, rubs, or bruits.  Abdomen is soft with good bowel sounds, there is no palpable mass. There is no palpable hepatosplenomegaly. There is no palpable fluid wave.  Musculoskeletal no tenderness of the spine, ribs, or hips.  Extremities there are no clubbing, cyanosis, or edema.  Skin no petechia, purpura or ecchymosis Neurologic is nonfocal.  Laboratory Data: White count 5.4, hemoglobin 10.1, hematocrit 30.8, platelets 69,000  Current Outpatient Prescriptions on File Prior to Visit  Medication Sig Dispense Refill  . ALPHA LIPOIC ACID PO Take 1 capsule by mouth every morning.       . B Complex Vitamins (VITAMIN B COMPLEX IJ) Inject as directed every  morning.      Marland Kitchen  folic acid (FOLVITE) 1 MG tablet Take 1 tablet (1 mg total) by mouth daily.  30 tablet  0  . gabapentin (NEURONTIN) 300 MG capsule Take 2 capsules (600 mg total) by mouth 3 (three) times daily.  180 capsule  3  . lidocaine-prilocaine (EMLA) cream APPLY TO THE AFFECTED AREA AS DIRECTED  30 g  4  . LORazepam (ATIVAN) 0.5 MG tablet Take 1 tablet (0.5 mg total) by mouth every 8 (eight) hours. For nausea  30 tablet  0  . metoCLOPramide (REGLAN) 10 MG tablet Take 10 mg by mouth every 6 (six) hours as needed. For nausea and vomiting      . metoprolol succinate (TOPROL-XL) 50 MG 24 hr tablet Take 1 tablet (50 mg total) by mouth daily.  90 tablet  3  . mirtazapine (REMERON) 15 MG tablet Take 1 tablet (15 mg total) by mouth at bedtime.  30 tablet  6  . Omeprazole 20 MG TBEC Take 1 tablet (20 mg total) by mouth 2 (two) times daily.  180 each  3  . oxycodone (OXY-IR) 5 MG capsule Take 1 capsule (5 mg total) by mouth every 6 (six) hours as needed. For pain  30 capsule  0  . sucralfate (CARAFATE) 1 GM/10ML suspension Take 1 g by mouth every 6 (six) hours.       Assessment/Plan: This is a pleasant, 61 year old, African American, female, with the following issues:  #1.  Metastatic cholangiocarcinoma.  She is status post her first cycle of Abraxane.  Unfortunately, we did have to delay her second cycle, and she was admitted to the hospital for febrile neutropenia.  She is doing much better.  Now since her break and is ready to start her day 1 of cycle 2.  We will plan to continue to monitor her closely.  The overall plan, is to give her 3 cycles of treatment before and repeat any additional scans to see if there has been a response.  #2.  Thrombocytopenia.  This is secondary to chemotherapy.  We will continue to monitor her counts closely.    #3.  Anemia.  This is most likely secondary to chemotherapy.  She is not symptomatic.  We will monitor her counts closely.  #4.  Recent hospitalization.   Again, this is secondary to febrile, neutropenia, as well as dehydration, and anemia.  This resolved during her hospitalization and she is now ready to proceed with her second cycle.  #5.  Followup.  We will follow back up with Ms.Lichtenberger in a few weeks, but before then should there be questions or concerns.

## 2012-07-30 ENCOUNTER — Ambulatory Visit (HOSPITAL_BASED_OUTPATIENT_CLINIC_OR_DEPARTMENT_OTHER): Payer: Medicare Other

## 2012-07-30 ENCOUNTER — Ambulatory Visit (HOSPITAL_BASED_OUTPATIENT_CLINIC_OR_DEPARTMENT_OTHER): Payer: Medicare Other | Admitting: Medical

## 2012-07-30 ENCOUNTER — Other Ambulatory Visit (HOSPITAL_BASED_OUTPATIENT_CLINIC_OR_DEPARTMENT_OTHER): Payer: Medicare Other | Admitting: Lab

## 2012-07-30 VITALS — BP 113/73 | HR 72 | Temp 98.3°F | Resp 16 | Ht 63.0 in | Wt 144.0 lb

## 2012-07-30 DIAGNOSIS — C221 Intrahepatic bile duct carcinoma: Secondary | ICD-10-CM

## 2012-07-30 DIAGNOSIS — D649 Anemia, unspecified: Secondary | ICD-10-CM

## 2012-07-30 DIAGNOSIS — R634 Abnormal weight loss: Secondary | ICD-10-CM

## 2012-07-30 DIAGNOSIS — C7800 Secondary malignant neoplasm of unspecified lung: Secondary | ICD-10-CM

## 2012-07-30 DIAGNOSIS — Z5111 Encounter for antineoplastic chemotherapy: Secondary | ICD-10-CM

## 2012-07-30 LAB — CBC WITH DIFFERENTIAL (CANCER CENTER ONLY)
BASO%: 0.2 % (ref 0.0–2.0)
EOS%: 0.7 % (ref 0.0–7.0)
LYMPH#: 0.5 10*3/uL — ABNORMAL LOW (ref 0.9–3.3)
MCHC: 32.1 g/dL (ref 32.0–36.0)
MONO#: 0.8 10*3/uL (ref 0.1–0.9)
NEUT#: 4.4 10*3/uL (ref 1.5–6.5)
NEUT%: 77.6 % (ref 39.6–80.0)
Platelets: 146 10*3/uL (ref 145–400)
RDW: 14.6 % (ref 11.1–15.7)
WBC: 5.7 10*3/uL (ref 3.9–10.0)

## 2012-07-30 LAB — CMP (CANCER CENTER ONLY)
ALT(SGPT): 15 U/L (ref 10–47)
AST: 21 U/L (ref 11–38)
CO2: 28 mEq/L (ref 18–33)
Calcium: 8.5 mg/dL (ref 8.0–10.3)
Chloride: 102 mEq/L (ref 98–108)
Creat: 0.9 mg/dl (ref 0.6–1.2)
Potassium: 3.7 mEq/L (ref 3.3–4.7)
Sodium: 140 mEq/L (ref 128–145)
Total Protein: 7.6 g/dL (ref 6.4–8.1)

## 2012-07-30 MED ORDER — ONDANSETRON 8 MG/50ML IVPB (CHCC)
8.0000 mg | Freq: Once | INTRAVENOUS | Status: AC
  Administered 2012-07-30: 8 mg via INTRAVENOUS

## 2012-07-30 MED ORDER — HEPARIN SOD (PORK) LOCK FLUSH 100 UNIT/ML IV SOLN
500.0000 [IU] | Freq: Once | INTRAVENOUS | Status: AC | PRN
  Administered 2012-07-30: 500 [IU]
  Filled 2012-07-30: qty 5

## 2012-07-30 MED ORDER — PACLITAXEL PROTEIN-BOUND CHEMO INJECTION 100 MG
90.0000 mg/m2 | Freq: Once | INTRAVENOUS | Status: AC
  Administered 2012-07-30: 150 mg via INTRAVENOUS
  Filled 2012-07-30: qty 30

## 2012-07-30 MED ORDER — DEXAMETHASONE SODIUM PHOSPHATE 10 MG/ML IJ SOLN
10.0000 mg | Freq: Once | INTRAMUSCULAR | Status: AC
  Administered 2012-07-30: 10 mg via INTRAVENOUS

## 2012-07-30 MED ORDER — HEPARIN SOD (PORK) LOCK FLUSH 100 UNIT/ML IV SOLN
250.0000 [IU] | Freq: Once | INTRAVENOUS | Status: DC | PRN
  Filled 2012-07-30: qty 5

## 2012-07-30 MED ORDER — OXYCODONE HCL 5 MG PO CAPS: 5.0000 mg | ORAL_CAPSULE | Freq: Four times a day (QID) | ORAL | Status: DC | PRN

## 2012-07-30 MED ORDER — SODIUM CHLORIDE 0.9 % IV SOLN
Freq: Once | INTRAVENOUS | Status: AC
  Administered 2012-07-30: 12:00:00 via INTRAVENOUS

## 2012-07-30 MED ORDER — SODIUM CHLORIDE 0.9 % IJ SOLN
10.0000 mL | INTRAMUSCULAR | Status: DC | PRN
  Administered 2012-07-30: 10 mL
  Filled 2012-07-30: qty 10

## 2012-07-30 NOTE — Patient Instructions (Addendum)
Paclitaxel injection What is this medicine? PACLITAXEL (PAK li TAX el) is a chemotherapy drug. It targets fast dividing cells, like cancer cells, and causes these cells to die. This medicine is used to treat ovarian cancer, breast cancer, and other cancers. This medicine may be used for other purposes; ask your health care provider or pharmacist if you have questions. What should I tell my health care provider before I take this medicine? They need to know if you have any of these conditions: -blood disorders -irregular heartbeat -infection (especially a virus infection such as chickenpox, cold sores, or herpes) -liver disease -previous or ongoing radiation therapy -an unusual or allergic reaction to paclitaxel, alcohol, polyoxyethylated castor oil, other chemotherapy agents, other medicines, foods, dyes, or preservatives -pregnant or trying to get pregnant -breast-feeding How should I use this medicine? This drug is given as an infusion into a vein. It is administered in a hospital or clinic by a specially trained health care professional. Talk to your pediatrician regarding the use of this medicine in children. Special care may be needed. Overdosage: If you think you have taken too much of this medicine contact a poison control center or emergency room at once. NOTE: This medicine is only for you. Do not share this medicine with others. What if I miss a dose? It is important not to miss your dose. Call your doctor or health care professional if you are unable to keep an appointment. What may interact with this medicine? Do not take this medicine with any of the following medications: -disulfiram -metronidazole This medicine may also interact with the following medications: -cyclosporine -dexamethasone -diazepam -ketoconazole -medicines to increase blood counts like filgrastim, pegfilgrastim, sargramostim -other chemotherapy drugs like cisplatin, doxorubicin, epirubicin, etoposide,  teniposide, vincristine -quinidine -testosterone -vaccines -verapamil Talk to your doctor or health care professional before taking any of these medicines: -acetaminophen -aspirin -ibuprofen -ketoprofen -naproxen This list may not describe all possible interactions. Give your health care provider a list of all the medicines, herbs, non-prescription drugs, or dietary supplements you use. Also tell them if you smoke, drink alcohol, or use illegal drugs. Some items may interact with your medicine. What should I watch for while using this medicine? Your condition will be monitored carefully while you are receiving this medicine. You will need important blood work done while you are taking this medicine. This drug may make you feel generally unwell. This is not uncommon, as chemotherapy can affect healthy cells as well as cancer cells. Report any side effects. Continue your course of treatment even though you feel ill unless your doctor tells you to stop. In some cases, you may be given additional medicines to help with side effects. Follow all directions for their use. Call your doctor or health care professional for advice if you get a fever, chills or sore throat, or other symptoms of a cold or flu. Do not treat yourself. This drug decreases your body's ability to fight infections. Try to avoid being around people who are sick. This medicine may increase your risk to bruise or bleed. Call your doctor or health care professional if you notice any unusual bleeding. Be careful brushing and flossing your teeth or using a toothpick because you may get an infection or bleed more easily. If you have any dental work done, tell your dentist you are receiving this medicine. Avoid taking products that contain aspirin, acetaminophen, ibuprofen, naproxen, or ketoprofen unless instructed by your doctor. These medicines may hide a fever. Do not become pregnant  while taking this medicine. Women should inform their  doctor if they wish to become pregnant or think they might be pregnant. There is a potential for serious side effects to an unborn child. Talk to your health care professional or pharmacist for more information. Do not breast-feed an infant while taking this medicine. Men are advised not to father a child while receiving this medicine. What side effects may I notice from receiving this medicine? Side effects that you should report to your doctor or health care professional as soon as possible: -allergic reactions like skin rash, itching or hives, swelling of the face, lips, or tongue -low blood counts - This drug may decrease the number of white blood cells, red blood cells and platelets. You may be at increased risk for infections and bleeding. -signs of infection - fever or chills, cough, sore throat, pain or difficulty passing urine -signs of decreased platelets or bleeding - bruising, pinpoint red spots on the skin, black, tarry stools, nosebleeds -signs of decreased red blood cells - unusually weak or tired, fainting spells, lightheadedness -breathing problems -chest pain -high or low blood pressure -mouth sores -nausea and vomiting -pain, swelling, redness or irritation at the injection site -pain, tingling, numbness in the hands or feet -slow or irregular heartbeat -swelling of the ankle, feet, hands Side effects that usually do not require medical attention (report to your doctor or health care professional if they continue or are bothersome): -bone pain -complete hair loss including hair on your head, underarms, pubic hair, eyebrows, and eyelashes -changes in the color of fingernails -diarrhea -loosening of the fingernails -loss of appetite -muscle or joint pain -red flush to skin -sweating This list may not describe all possible side effects. Call your doctor for medical advice about side effects. You may report side effects to FDA at 1-800-FDA-1088. Where should I keep my  medicine? This drug is given in a hospital or clinic and will not be stored at home. NOTE: This sheet is a summary. It may not cover all possible information. If you have questions about this medicine, talk to your doctor, pharmacist, or health care provider.  2012, [employer]. (07/11/2008 11:54:26 AM)

## 2012-07-30 NOTE — Progress Notes (Signed)
Diagnosis: Metastatic cholangiocarcinoma.  Past therapy: Avastin/Tarceva.  Current therapy: #1 she is status post cycle 2 day 1 of Abraxane (2 weeks on/one-week off).  Interim history: [name] presents today for an office followup visit.  She is status post cycle 2 day 1 of Abraxane.  Overall, she appears to be tolerating it relatively well.  She does not report any major toxicity.  She does have some intermittent constipation.  I informed her that she take a stool softener and laxative if needed.  She does take oxycodone intermittently for abdominal pain.  For the most part, her labs look pretty good.  I have noticed, that she's lost about 6 or 7 pounds since the last, time, we saw her.  I did advise the supplemental nutrition drink, such as Carnation instant breakfast.  She does not like, boost or Ensure.  I think nutrition may be an issue for her.  We will have to monitor this closely.  She denies any nausea, vomiting or diarrhea.  She denies any chest pain, shortness of breath, or cough.  She denies any abdominal pain.  She denies any fevers, chills, night sweats, or palpable neuropathy.  She denies any obvious, or abnormal bleeding.  She denies any headaches, visual changes, or rashes.  She is ready to proceed with her day 8 of cycle 2 of Abraxane today.  Review of Systems: Constitutional:Negative for malaise/fatigue, fever, chills, weight loss, diaphoresis, activity change, appetite change, and unexpected weight change.  HEENT: Negative for double vision, blurred vision, visual loss, ear pain, tinnitus, congestion, rhinorrhea, epistaxis sore throat or sinus disease, oral pain/lesion, tongue soreness Respiratory: Negative for cough, chest tightness, shortness of breath, wheezing and stridor.  Cardiovascular: Negative for chest pain, palpitations, leg swelling, orthopnea, PND, DOE or claudication Gastrointestinal: Negative for nausea, vomiting, abdominal pain, diarrhea, constipation, blood in  stool, melena, hematochezia, abdominal distention, anal bleeding, rectal pain, anorexia and hematemesis.  Genitourinary: Negative for dysuria, frequency, hematuria,  Musculoskeletal: Negative for myalgias, back pain, joint swelling, arthralgias and gait problem.  Skin: Negative for rash, color change, pallor and wound.  Neurological:. Negative for dizziness/light-headedness, tremors, seizures, syncope, facial asymmetry, speech difficulty, weakness, numbness, headaches and paresthesias.  Hematological: Negative for adenopathy. Does not bruise/bleed easily.  Psychiatric/Behavioral:  Negative for depression, no loss of interest in normal activity or change in sleep pattern.   Physical Exam: This is a 61 year old, well-developed, well-nourished African American female, in no obvious distress Vitals: Temperature 90.3 degrees, pulse 72, respirations 16, blood pressure 113/73, weight 144 pounds HEENT reveals a normocephalic, atraumatic skull, no scleral icterus, no oral lesions  Neck is supple without any cervical or supraclavicular adenopathy.  Lungs are clear to auscultation bilaterally. There are no wheezes, rales or rhonci Cardiac is regular rate and rhythm with a normal S1 and S2. There are no murmurs, rubs, or bruits.  Abdomen is soft with good bowel sounds, there is no palpable mass. There is no palpable hepatosplenomegaly. There is no palpable fluid wave.  Musculoskeletal no tenderness of the spine, ribs, or hips.  Extremities there are no clubbing, cyanosis, or edema.  Skin no petechia, purpura or ecchymosis Neurologic is nonfocal.  Laboratory Data: White count 5.7, hemoglobin 9.6, hematocrit 29.9, platelets 146,000  Current Outpatient Prescriptions on File Prior to Visit  Medication Sig Dispense Refill  . ALPHA LIPOIC ACID PO Take 1 capsule by mouth every morning.       . B Complex Vitamins (VITAMIN B COMPLEX IJ) Inject as directed every morning.      Marland Kitchen  gabapentin (NEURONTIN) 300 MG  capsule Take 2 capsules (600 mg total) by mouth 3 (three) times daily.  180 capsule  3  . lidocaine-prilocaine (EMLA) cream APPLY TO THE AFFECTED AREA AS DIRECTED  30 g  4  . lisinopril (PRINIVIL,ZESTRIL) 10 MG tablet       . LORazepam (ATIVAN) 0.5 MG tablet Take 1 tablet (0.5 mg total) by mouth every 8 (eight) hours. For nausea  30 tablet  0  . metoCLOPramide (REGLAN) 10 MG tablet Take 10 mg by mouth every 6 (six) hours as needed. For nausea and vomiting      . metoprolol succinate (TOPROL-XL) 50 MG 24 hr tablet Take 1 tablet (50 mg total) by mouth daily.  90 tablet  3  . mirtazapine (REMERON) 15 MG tablet Take 1 tablet (15 mg total) by mouth at bedtime.  30 tablet  6  . Omeprazole 20 MG TBEC Take 1 tablet (20 mg total) by mouth 2 (two) times daily.  180 each  3  . oxycodone (OXY-IR) 5 MG capsule Take 1 capsule (5 mg total) by mouth every 6 (six) hours as needed. For pain  30 capsule  0  . sucralfate (CARAFATE) 1 GM/10ML suspension Take 1 g by mouth every 6 (six) hours.      . TARCEVA 150 MG tablet        Assessment/Plan: This is a pleasant, 61 year old, African American, female, with the following issues:  #1.  Metastatic cholangiocarcinoma.  She is status post cycle 2 day 1 of Abraxane.  Overall, she's doing quite well.  On the Abraxane.  She will go ahead with her cycle 2 day 8 today.   We will plan to continue to monitor her closely.  The overall plan, is to give her 3 cycles of treatment  and repeat any additional scans to see if there has been a response.   #2.  Anemia.  This is most likely secondary to chemotherapy.  She is not symptomatic.  We will monitor her counts closely.  #3.  Weight loss.  This is most likely secondary to her cancer as well as chemotherapy.  I did advise her to try supplemental nutrition in the form of Carnation instant breakfast.  We will have to watch her closely.  #4.  Followup.  We will follow back up with [name] in a few weeks, but before then should there  be questions or concerns.

## 2012-08-06 ENCOUNTER — Other Ambulatory Visit: Payer: Medicare Other | Admitting: Lab

## 2012-08-06 ENCOUNTER — Ambulatory Visit: Payer: Medicare Other

## 2012-08-07 ENCOUNTER — Encounter: Payer: Self-pay | Admitting: *Deleted

## 2012-08-07 ENCOUNTER — Other Ambulatory Visit: Payer: Self-pay | Admitting: *Deleted

## 2012-08-07 ENCOUNTER — Other Ambulatory Visit: Payer: Self-pay | Admitting: Hematology & Oncology

## 2012-08-07 DIAGNOSIS — J40 Bronchitis, not specified as acute or chronic: Secondary | ICD-10-CM

## 2012-08-07 DIAGNOSIS — R05 Cough: Secondary | ICD-10-CM

## 2012-08-07 MED ORDER — HYDROCOD POLST-CHLORPHEN POLST 10-8 MG/5ML PO LQCR: 5.0000 mL | Freq: Two times a day (BID) | ORAL | Status: DC | PRN

## 2012-08-07 MED ORDER — CEFDINIR 300 MG PO CAPS: ORAL_CAPSULE | ORAL | Status: DC

## 2012-08-07 NOTE — Progress Notes (Signed)
Pt called to report she has had a cough that kept her up last night.  Not sure what color phlegm was.  No sinus congestion.  Says she has been running a fever but hasn't taken her temp.  Also c/o SOB at rest.  Dr Myna Hidalgo notified and he called pt and talked to her.  Antibiotic and cough med called to pt's pharmacy.

## 2012-08-13 ENCOUNTER — Ambulatory Visit (HOSPITAL_BASED_OUTPATIENT_CLINIC_OR_DEPARTMENT_OTHER)
Admission: RE | Admit: 2012-08-13 | Discharge: 2012-08-13 | Disposition: A | Discharge status: Home / Self Care | Payer: Medicare Other | Source: Ambulatory Visit | Attending: Hematology & Oncology | Admitting: Hematology & Oncology

## 2012-08-13 ENCOUNTER — Encounter (HOSPITAL_BASED_OUTPATIENT_CLINIC_OR_DEPARTMENT_OTHER): Payer: Self-pay

## 2012-08-13 ENCOUNTER — Ambulatory Visit: Payer: Medicare Other | Admitting: Hematology & Oncology

## 2012-08-13 ENCOUNTER — Ambulatory Visit (HOSPITAL_BASED_OUTPATIENT_CLINIC_OR_DEPARTMENT_OTHER): Payer: Medicare Other

## 2012-08-13 ENCOUNTER — Other Ambulatory Visit (HOSPITAL_BASED_OUTPATIENT_CLINIC_OR_DEPARTMENT_OTHER): Payer: Medicare Other | Admitting: Lab

## 2012-08-13 ENCOUNTER — Inpatient Hospital Stay (HOSPITAL_BASED_OUTPATIENT_CLINIC_OR_DEPARTMENT_OTHER)
Admission: EM | Admit: 2012-08-13 | Discharge: 2012-08-18 | DRG: 193 | Disposition: A | Discharge status: Home / Self Care | Payer: Medicare Other | Attending: Internal Medicine | Admitting: Internal Medicine

## 2012-08-13 VITALS — BP 109/77 | HR 84 | Temp 97.8°F | Resp 14 | Ht 63.0 in | Wt 142.0 lb

## 2012-08-13 DIAGNOSIS — Z79899 Other long term (current) drug therapy: Secondary | ICD-10-CM

## 2012-08-13 DIAGNOSIS — I252 Old myocardial infarction: Secondary | ICD-10-CM

## 2012-08-13 DIAGNOSIS — R079 Chest pain, unspecified: Secondary | ICD-10-CM | POA: Insufficient documentation

## 2012-08-13 DIAGNOSIS — F3289 Other specified depressive episodes: Secondary | ICD-10-CM | POA: Diagnosis present

## 2012-08-13 DIAGNOSIS — R509 Fever, unspecified: Secondary | ICD-10-CM | POA: Insufficient documentation

## 2012-08-13 DIAGNOSIS — R05 Cough: Secondary | ICD-10-CM

## 2012-08-13 DIAGNOSIS — J1000 Influenza due to other identified influenza virus with unspecified type of pneumonia: Principal | ICD-10-CM | POA: Diagnosis present

## 2012-08-13 DIAGNOSIS — D6481 Anemia due to antineoplastic chemotherapy: Secondary | ICD-10-CM | POA: Diagnosis present

## 2012-08-13 DIAGNOSIS — C7800 Secondary malignant neoplasm of unspecified lung: Secondary | ICD-10-CM | POA: Diagnosis present

## 2012-08-13 DIAGNOSIS — C23 Malignant neoplasm of gallbladder: Secondary | ICD-10-CM | POA: Insufficient documentation

## 2012-08-13 DIAGNOSIS — R0602 Shortness of breath: Secondary | ICD-10-CM

## 2012-08-13 DIAGNOSIS — K625 Hemorrhage of anus and rectum: Secondary | ICD-10-CM | POA: Diagnosis not present

## 2012-08-13 DIAGNOSIS — D509 Iron deficiency anemia, unspecified: Secondary | ICD-10-CM

## 2012-08-13 DIAGNOSIS — J189 Pneumonia, unspecified organism: Secondary | ICD-10-CM

## 2012-08-13 DIAGNOSIS — K219 Gastro-esophageal reflux disease without esophagitis: Secondary | ICD-10-CM | POA: Diagnosis present

## 2012-08-13 DIAGNOSIS — R630 Anorexia: Secondary | ICD-10-CM | POA: Diagnosis present

## 2012-08-13 DIAGNOSIS — F329 Major depressive disorder, single episode, unspecified: Secondary | ICD-10-CM | POA: Diagnosis present

## 2012-08-13 DIAGNOSIS — K922 Gastrointestinal hemorrhage, unspecified: Secondary | ICD-10-CM

## 2012-08-13 DIAGNOSIS — T451X5A Adverse effect of antineoplastic and immunosuppressive drugs, initial encounter: Secondary | ICD-10-CM | POA: Diagnosis present

## 2012-08-13 DIAGNOSIS — I739 Peripheral vascular disease, unspecified: Secondary | ICD-10-CM | POA: Diagnosis present

## 2012-08-13 DIAGNOSIS — K5730 Diverticulosis of large intestine without perforation or abscess without bleeding: Secondary | ICD-10-CM | POA: Diagnosis present

## 2012-08-13 DIAGNOSIS — E43 Unspecified severe protein-calorie malnutrition: Secondary | ICD-10-CM | POA: Diagnosis present

## 2012-08-13 DIAGNOSIS — J101 Influenza due to other identified influenza virus with other respiratory manifestations: Secondary | ICD-10-CM

## 2012-08-13 DIAGNOSIS — D6959 Other secondary thrombocytopenia: Secondary | ICD-10-CM | POA: Diagnosis present

## 2012-08-13 DIAGNOSIS — C221 Intrahepatic bile duct carcinoma: Secondary | ICD-10-CM | POA: Diagnosis present

## 2012-08-13 DIAGNOSIS — R059 Cough, unspecified: Secondary | ICD-10-CM | POA: Insufficient documentation

## 2012-08-13 DIAGNOSIS — E785 Hyperlipidemia, unspecified: Secondary | ICD-10-CM | POA: Diagnosis present

## 2012-08-13 DIAGNOSIS — E876 Hypokalemia: Secondary | ICD-10-CM | POA: Diagnosis present

## 2012-08-13 DIAGNOSIS — I10 Essential (primary) hypertension: Secondary | ICD-10-CM | POA: Diagnosis present

## 2012-08-13 DIAGNOSIS — J310 Chronic rhinitis: Secondary | ICD-10-CM | POA: Diagnosis present

## 2012-08-13 DIAGNOSIS — IMO0002 Reserved for concepts with insufficient information to code with codable children: Secondary | ICD-10-CM

## 2012-08-13 DIAGNOSIS — D649 Anemia, unspecified: Secondary | ICD-10-CM

## 2012-08-13 DIAGNOSIS — K648 Other hemorrhoids: Secondary | ICD-10-CM | POA: Diagnosis present

## 2012-08-13 HISTORY — DX: Pneumonia, unspecified organism: J18.9

## 2012-08-13 LAB — CBC WITH DIFFERENTIAL (CANCER CENTER ONLY)
BASO%: 0.2 % (ref 0.0–2.0)
EOS%: 0.6 % (ref 0.0–7.0)
LYMPH#: 0.6 10*3/uL — ABNORMAL LOW (ref 0.9–3.3)
MCH: 30.9 pg (ref 26.0–34.0)
MCHC: 32.1 g/dL (ref 32.0–36.0)
MONO%: 10.6 % (ref 0.0–13.0)
NEUT#: 5.2 10*3/uL (ref 1.5–6.5)
NEUT%: 79.4 % (ref 39.6–80.0)
RDW: 15.6 % (ref 11.1–15.7)

## 2012-08-13 MED ORDER — VANCOMYCIN HCL 1000 MG IV SOLR
750.0000 mg | Freq: Two times a day (BID) | INTRAVENOUS | Status: DC
  Filled 2012-08-13: qty 750

## 2012-08-13 MED ORDER — GABAPENTIN 300 MG PO CAPS
600.0000 mg | ORAL_CAPSULE | Freq: Three times a day (TID) | ORAL | Status: DC
  Administered 2012-08-13 – 2012-08-18 (×14): 600 mg via ORAL
  Filled 2012-08-13 (×16): qty 2

## 2012-08-13 MED ORDER — OXYCODONE HCL 5 MG PO CAPS: 5.0000 mg | ORAL_CAPSULE | Freq: Four times a day (QID) | ORAL | Status: DC | PRN

## 2012-08-13 MED ORDER — DEXTROSE 5 % IV SOLN
1.0000 g | Freq: Three times a day (TID) | INTRAVENOUS | Status: DC
  Filled 2012-08-13 (×2): qty 1

## 2012-08-13 MED ORDER — LEVOFLOXACIN IN D5W 750 MG/150ML IV SOLN
750.0000 mg | INTRAVENOUS | Status: DC
  Administered 2012-08-13: 750 mg via INTRAVENOUS
  Filled 2012-08-13: qty 150

## 2012-08-13 MED ORDER — DEXTROSE 5 % IV SOLN
1.0000 g | Freq: Three times a day (TID) | INTRAVENOUS | Status: DC
  Administered 2012-08-14 – 2012-08-16 (×7): 1 g via INTRAVENOUS
  Filled 2012-08-13 (×9): qty 1

## 2012-08-13 MED ORDER — OSELTAMIVIR PHOSPHATE 6 MG/ML PO SUSR: 75.0000 mg | Freq: Two times a day (BID) | ORAL | Status: DC

## 2012-08-13 MED ORDER — IPRATROPIUM-ALBUTEROL 0.5-2.5 (3) MG/3ML IN SOLN
3.0000 mL | Freq: Once | RESPIRATORY_TRACT | Status: AC
  Administered 2012-08-13: 3 mL via RESPIRATORY_TRACT

## 2012-08-13 MED ORDER — VANCOMYCIN HCL IN DEXTROSE 1-5 GM/200ML-% IV SOLN
1000.0000 mg | Freq: Once | INTRAVENOUS | Status: DC
  Administered 2012-08-13: 1000 mg via INTRAVENOUS
  Filled 2012-08-13: qty 200

## 2012-08-13 MED ORDER — SODIUM CHLORIDE 0.9 % IV SOLN
INTRAVENOUS | Status: DC
  Administered 2012-08-13: 20:00:00 via INTRAVENOUS

## 2012-08-13 MED ORDER — VANCOMYCIN HCL 1000 MG IV SOLR
750.0000 mg | Freq: Two times a day (BID) | INTRAVENOUS | Status: DC
  Administered 2012-08-14 – 2012-08-16 (×5): 750 mg via INTRAVENOUS
  Filled 2012-08-13 (×7): qty 750

## 2012-08-13 MED ORDER — DOCUSATE SODIUM 100 MG PO CAPS
100.0000 mg | ORAL_CAPSULE | Freq: Two times a day (BID) | ORAL | Status: DC
  Administered 2012-08-13 – 2012-08-18 (×9): 100 mg via ORAL
  Filled 2012-08-13 (×10): qty 1

## 2012-08-13 MED ORDER — MIRTAZAPINE 15 MG PO TABS
15.0000 mg | ORAL_TABLET | Freq: Every day | ORAL | Status: DC
  Administered 2012-08-13 – 2012-08-17 (×5): 15 mg via ORAL
  Filled 2012-08-13 (×6): qty 1

## 2012-08-13 MED ORDER — SODIUM CHLORIDE 0.9 % IJ SOLN: 3.0000 mL | INTRAMUSCULAR | Status: DC | PRN

## 2012-08-13 MED ORDER — PANTOPRAZOLE SODIUM 40 MG PO TBEC
40.0000 mg | DELAYED_RELEASE_TABLET | Freq: Two times a day (BID) | ORAL | Status: DC
  Administered 2012-08-13 – 2012-08-18 (×10): 40 mg via ORAL
  Filled 2012-08-13 (×11): qty 1

## 2012-08-13 MED ORDER — METOPROLOL SUCCINATE ER 50 MG PO TB24
50.0000 mg | ORAL_TABLET | Freq: Every day | ORAL | Status: DC
  Administered 2012-08-14 – 2012-08-18 (×5): 50 mg via ORAL
  Filled 2012-08-13 (×5): qty 1

## 2012-08-13 MED ORDER — POLYETHYLENE GLYCOL 3350 17 G PO PACK
17.0000 g | PACK | Freq: Every day | ORAL | Status: DC | PRN
  Filled 2012-08-13: qty 1

## 2012-08-13 MED ORDER — SODIUM CHLORIDE 0.9 % IJ SOLN
3.0000 mL | Freq: Two times a day (BID) | INTRAMUSCULAR | Status: DC
  Administered 2012-08-14 – 2012-08-15 (×2): 3 mL via INTRAVENOUS

## 2012-08-13 MED ORDER — SODIUM CHLORIDE 0.9 % IV SOLN: 250.0000 mL | INTRAVENOUS | Status: DC | PRN

## 2012-08-13 MED ORDER — BISACODYL 5 MG PO TBEC: 5.0000 mg | DELAYED_RELEASE_TABLET | Freq: Every day | ORAL | Status: DC | PRN

## 2012-08-13 MED ORDER — SODIUM CHLORIDE 0.9 % IV SOLN
Freq: Once | INTRAVENOUS | Status: AC
  Administered 2012-08-13: 17:00:00 via INTRAVENOUS

## 2012-08-13 MED ORDER — LEVOFLOXACIN IN D5W 750 MG/150ML IV SOLN
750.0000 mg | INTRAVENOUS | Status: AC
  Administered 2012-08-14 – 2012-08-16 (×3): 750 mg via INTRAVENOUS
  Filled 2012-08-13 (×3): qty 150

## 2012-08-13 MED ORDER — DEXTROSE 5 % IV SOLN
1.0000 g | Freq: Three times a day (TID) | INTRAVENOUS | Status: DC
  Administered 2012-08-13: 1 g via INTRAVENOUS
  Filled 2012-08-13 (×2): qty 1

## 2012-08-13 MED ORDER — DEXTROSE 5 % IV SOLN: 500.0000 mg | Freq: Once | INTRAVENOUS | Status: DC

## 2012-08-13 MED ORDER — DEXTROSE 5 % IV SOLN: 1.0000 g | Freq: Once | INTRAVENOUS | Status: DC

## 2012-08-13 MED ORDER — ENOXAPARIN SODIUM 40 MG/0.4ML ~~LOC~~ SOLN
40.0000 mg | SUBCUTANEOUS | Status: DC
  Administered 2012-08-14: 40 mg via SUBCUTANEOUS
  Filled 2012-08-13: qty 0.4

## 2012-08-13 MED ORDER — OSELTAMIVIR PHOSPHATE 75 MG PO CAPS
75.0000 mg | ORAL_CAPSULE | Freq: Two times a day (BID) | ORAL | Status: AC
  Administered 2012-08-13 – 2012-08-18 (×10): 75 mg via ORAL
  Filled 2012-08-13 (×10): qty 1

## 2012-08-13 NOTE — Progress Notes (Signed)
[name] seen by Dr. Myna Hidalgo, was for chemo infusion today but d/t condition-pneumonia, patient transported to ED via [name] [name], [name]

## 2012-08-13 NOTE — ED Notes (Signed)
[facility] at bedside preparing patient for transport.  

## 2012-08-13 NOTE — ED Notes (Signed)
MD at bedside. 

## 2012-08-13 NOTE — ED Notes (Signed)
Port accessed by Windell Moulding, Charity fundraiser.

## 2012-08-13 NOTE — Progress Notes (Signed)
This office note has been dictated.

## 2012-08-13 NOTE — Progress Notes (Signed)
ANTIBIOTIC CONSULT NOTE - INITIAL  Pharmacy Consult for vancomycin Indication: rule out pneumonia  Allergies  Allergen Reactions  . Promethazine Hcl Anxiety and Other (See Comments)    Change in LOC  . Sulfonamide Derivatives Hives    All over the body    Patient Measurements: Height: 5\' 5"  (165.1 cm) Weight: 142 lb (64.411 kg) IBW/kg (Calculated) : 57    Vital Signs: Temp: 97.6 F (36.4 C) (01/02 1534) Temp src: Oral (01/02 1534) BP: 136/79 mmHg (01/02 1534) Pulse Rate: 90  (01/02 1534) Intake/Output from previous day:   Intake/Output from this shift:    Labs:  Basename 08/13/12 1334  WBC 6.5  HGB 9.2*  PLT 142*  LABCREA --  CREATININE --   Estimated Creatinine Clearance: 59.8 ml/min (by C-G formula based on Cr of 0.9). No results found for this basename: VANCOTROUGH:2,VANCOPEAK:2,VANCORANDOM:2,GENTTROUGH:2,GENTPEAK:2,GENTRANDOM:2,TOBRATROUGH:2,TOBRAPEAK:2,TOBRARND:2,AMIKACINPEAK:2,AMIKACINTROU:2,AMIKACIN:2, in the last 72 hours   Microbiology: No results found for this or any previous visit (from the past 720 hour(s)).  Medical History: Past Medical History  Diagnosis Date  . Hypertension   . Hyperlipidemia   . GERD (gastroesophageal reflux disease)   . Depression   . Esophageal stricture 2003     [place] [facility] esophageal dilation in 2003 performed by Dr. Marina Goodell, second esophageal dilation performed in 2006  by Dr. Marina Goodell  . Obstructive jaundice due to malignant neoplasm  February 2012     status post ERCP done by Dr. Marina Goodell with biliary sphincterotomy, bile duct stricture brushings, biliary stent placement, performed secondary to obstructive jaundice, malignant-appearing bile duct stricture of the common hepatic duct worrisome for primary cholangiocarcinoma, status post ERCP with biliary sphincterotomy and biliary stent placement...status post biopsy -  markedly atypical cells cons  . Rotator cuff syndrome 2010     per MRI March 2010 there is a prominent  articular surface partial tear of the supraspinatus which is nearly full thickness, as well as a tear of the distal anterior supraspinatus which probably represents a full-thickness partial tear,  with moderate supraspinatus tendinopathy, moderate infraspinatus tendinopathy and prominent undersurface tear of the acromion along the coracoacromial ligament  . Hiatal hernia   . SOB (shortness of breath) on exertion   . Bile duct adenocarcinoma   . Myocardial infarction   . Angina   . Headache   . Arthritis   . Dysrhythmia   . Peripheral vascular disease   . Anemia   . Diarrhea   . Nausea   . Cholangiocarcinoma   . Complication of anesthesia     Assessment: 62 yo female with possible PNA noted with metastatic cholangiocarcinoma (Last chemotherapy was 3 weeks ago).  Patient has been ordered levaquin and cefepime and also ordered vancomycin per pharmacy,  Goal of Therapy:  Vancomycin trough level 15-20 mcg/ml  Plan:  -Vancomycin 1000mg  IV x1 followed by 750mg  IV q12h -No changes needed in other antibiotic regimens -Will follow cultures, renal function and clinical progress  Harland German, Pharm D 08/13/2012 4:45 PM

## 2012-08-13 NOTE — ED Notes (Signed)
Report given to Shanda Bumps, RN [facility] Room 1404-1.

## 2012-08-13 NOTE — H&P (Signed)
History and Physical  [name] ZOX:[id number] DOB: 1951/07/03 DOA: 08/13/2012  Referring physician: Gerhard Munch, MD PCP: Genella Mech, MD  Oncologist: Arlan Organ, M.D.  Chief Complaint: Cough  HPI:  62 year old woman presented with history of 8 days of cough, low-grade fever, progressive shortness of breath and was referred for admission for pneumonia.  History obtained at the bedside from patient, son, daughter, husband. Illness began 12/26 with cough and shortness of breath. She was started on cefdinir 12/27 fail to improve. She developed progressive worsening of cough, shortness of breath and continued low-grade fever. She also complained of myalgias. She has not had a flu vaccination this year.  Patient was seen [name] by Dr. Myna Hidalgo for chemotherapy today and referred to the emergency department secondary to pneumonia. Office note was dictated, not available for review at this time. Transfer accepted by my partner Dr. Gonzella Lex.  In the emergency department was noted be afebrile with normal blood pressure, heart rate and oxygenation, mild tachypnea. No chemistry panel performed. She was subsequently transferred to [name] long as a direct admission for further treatment.  Chart Review:  Seen at the [name] 12/19. Noted to be doing well at that time.  Complained of cough, fever, shortness of breath at rest 12/27. Antibiotic called in.  Review of Systems:  Negative for visual changes, sore throat, rash, dysuria, bleeding, n/v. Oral intake has been fair.  Positive for chest pain 12/27, abdominal pain, chronic.   Past Medical History  Diagnosis Date  . Hypertension   . Hyperlipidemia   . GERD (gastroesophageal reflux disease)   . Depression   . Esophageal stricture 2003     [name] post esophageal dilation in 2003 performed by Dr. Marina Goodell, second esophageal dilation performed in 2006  by Dr. Marina Goodell  . Obstructive jaundice due to malignant  neoplasm  February 2012     status post ERCP done by Dr. Marina Goodell with biliary sphincterotomy, bile duct stricture brushings, biliary stent placement, performed secondary to obstructive jaundice, malignant-appearing bile duct stricture of the common hepatic duct worrisome for primary cholangiocarcinoma, status post ERCP with biliary sphincterotomy and biliary stent placement...status post biopsy -  markedly atypical cells cons  . Rotator cuff syndrome 2010     per MRI March 2010 there is a prominent articular surface partial tear of the supraspinatus which is nearly full thickness, as well as a tear of the distal anterior supraspinatus which probably represents a full-thickness partial tear,  with moderate supraspinatus tendinopathy, moderate infraspinatus tendinopathy and prominent undersurface tear of the acromion along the coracoacromial ligament  . Hiatal hernia   . SOB (shortness of breath) on exertion   . Bile duct adenocarcinoma   . Myocardial infarction   . Angina   . Headache   . Arthritis   . Dysrhythmia   . Peripheral vascular disease   . Anemia   . Diarrhea   . Nausea   . Cholangiocarcinoma   . Complication of anesthesia     Past Surgical History  Procedure Date  . Cholecystectomy  2007  .  aortogram   August 2011     ultrasound-guided access to the right common femoral artery, an aortogram with bilateral iliac arteriogram and bilateral lower extremity runoff, this was performed secondary to progressive bilateral lower extremity claudication, left common iliac artery occlusion as well as short segment of right superficial femoral artery occlusion, procedure performed by Dr. Durwin Nora  . Bypass graft  January 2009  of the infected femoral-femoral graft with vein patch angioplasty of bilateral femoral artery, 7 by [name],   .  abdominal aortic angiography  September 2008     performed by Dr. 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 Cardiac catheterization  August 2011     patent coronary arteries with diffuse nonobstructive disease, there are no areas of high grade stenosis present,  determined the chest pain is most likely noncardiac in origin, continue medical management.  . Bile duct surgery     60% of liver removed  . Arthroscopy knee w/ drilling   . Esophagogastroduodenoscopy 07/10/2011    Procedure: ESOPHAGOGASTRODUODENOSCOPY (EGD);  Surgeon: Yancey Flemings, MD;  Location: [facility];  Service: Endoscopy;  Laterality: N/A;  . Liver resection     section of liver resceted due to bile duct ca  . Esophagogastroduodenoscopy 07/20/2011    Procedure: ESOPHAGOGASTRODUODENOSCOPY (EGD);  Surgeon: Erick Blinks, MD;  Location: [facility];  Service: Gastroenterology;  Laterality: N/A;  . Esophagogastroduodenoscopy 07/31/2011    Procedure: ESOPHAGOGASTRODUODENOSCOPY (EGD);  Surgeon: Yancey Flemings, MD;  Location: [facility];  Service: Endoscopy;  Laterality: N/A;  . Mass excision 03/05/2012    Procedure: EXCISION MASS;  Surgeon: Adolph Pollack, MD;  Location: [facility] OR;  Service: General;  Laterality: N/A;  Right chest wall    Social History:  reports that she quit smoking about 13 years ago. She has never used smokeless tobacco. She reports that she does not drink alcohol or use illicit drugs.  Allergies  Allergen Reactions  . Promethazine Hcl Anxiety and Other (See Comments)    Change in LOC  . Sulfonamide Derivatives Hives    All over the body    Family History  Problem Relation Age of Onset  . Heart disease Brother      Prior to Admission medications   Medication Sig Start Date End Date Taking? Authorizing Provider  ALPHA LIPOIC ACID PO Take 1 capsule by mouth every morning.     Historical Provider, MD  B Complex Vitamins (VITAMIN-B COMPLEX) TABS Take by mouth every  morning.    Historical Provider, MD  cefdinir (OMNICEF) 300 MG capsule Take 2 capsules by mouth once a day 08/07/12   Josph Macho, MD  chlorpheniramine-HYDROcodone (TUSSIONEX) 10-8 MG/5ML [name] Take 5 mLs by mouth every 12 (twelve) hours as needed. 08/07/12   Josph Macho, MD  gabapentin (NEURONTIN) 300 MG capsule Take 2 capsules (600 mg total) by mouth 3 (three) times daily. 04/09/12   Judie Bonus, MD  lidocaine-prilocaine (EMLA) cream APPLY TO THE AFFECTED AREA AS DIRECTED 06/11/12   Josph Macho, MD  lisinopril (PRINIVIL,ZESTRIL) 10 MG tablet Take 10 mg by mouth daily.  06/05/12   Historical Provider, MD  LORazepam (ATIVAN) 0.5 MG tablet Take 1 tablet (0.5 mg total) by mouth every 8 (eight) hours. For nausea 06/11/12   Josph Macho, MD  metoCLOPramide (REGLAN) 10 MG tablet Take 10 mg by mouth every 6 (six) hours as needed. For nausea and vomiting 08/15/11 10/22/12  Josph Macho, MD  metoprolol succinate (TOPROL-XL) 50 MG 24 hr tablet Take 1 tablet (50 mg total) by mouth daily. 06/27/12   Josph Macho, MD  mirtazapine (REMERON) 15 MG tablet Take 1 tablet (15 mg total) by mouth at bedtime. 06/27/12   Josph Macho, MD  Omeprazole 20 MG TBEC Take 1 tablet (20 mg total) by mouth 2 (two) times daily. 04/09/12   Judie Bonus, MD  oxycodone (OXY-IR) 5 MG capsule Take 1 capsule (5 mg total) by mouth every 6 (six) hours as needed. For pain 07/30/12   Eunice Blase, PA  sucralfate (CARAFATE) 1 GM/10ML suspension Take 1 g by mouth every 6 (six) hours.    Historical Provider, MD   Physical Exam: Filed Vitals:   08/13/12 1534 08/13/12 1856  BP: 136/79 136/85  Pulse: 90 90  Temp: 97.6 F (36.4 C) 98.4 F (36.9 C)  TempSrc: Oral Oral  Resp: 20 22  Height: 5\' 5"  (1.651 m) 5\' 5"  (1.651 m)  Weight: 64.411 kg (142 lb) 63.957 kg (141 lb)  SpO2: 97%     General:  Appears chronically ill, weak, nontoxic. Appears comfortable. Eyes: PERRL, normal lids, irises  ENT: grossly normal  hearing, lips & tongue Neck: no LAD, masses or thyromegaly Cardiovascular: RRR, no m/r/g. No LE edema. Telemetry: SR, no arrhythmias  Respiratory: Coarse inspiratory breath sounds bilaterally, respiratory crackles bilaterally anteriorly, not posteriorly. No frank wheezes or rhonchi. Normal respiratory effort. Abdomen: soft, nondistended, mild generalized tenderness. Skin: no rash or induration seen on limited exam Musculoskeletal: grossly normal tone BUE/BLE Psychiatric: grossly normal mood and affect, speech fluent and appropriate Neurologic: grossly non-focal.  Wt Readings from Last 3 Encounters:  08/13/12 63.957 kg (141 lb)  08/13/12 64.411 kg (142 lb)  07/30/12 65.318 kg (144 lb)    Labs on Admission:  Basic Metabolic Panel: No results found for this basename: NA:5,K:5,CL:5,CO2:5,GLUCOSE:5,BUN:5,CREATININE:5,CALCIUM:5,MG:5,PHOS:5 in the last 168 hours  Liver Function Tests: No results found for this basename: AST:5,ALT:5,ALKPHOS:5,BILITOT:5,PROT:5,ALBUMIN:5 in the last 168 hours No results found for this basename: LIPASE:5,AMYLASE:5 in the last 168 hours No results found for this basename: AMMONIA:5 in the last 168 hours  CBC:  Lab 08/13/12 1334  WBC 6.5  NEUTROABS 5.2  HGB 9.2*  HCT 28.7*  MCV 96  PLT 142*    Cardiac Enzymes: No results found for this basename: CKTOTAL:5,CKMB:5,CKMBINDEX:5,TROPONINI:5 in the last 168 hours  Troponin (Point of Care Test) No results found for this basename: TROPIPOC in the last 72 hours  BNP (last 3 results) No results found for this basename: PROBNP:3 in the last 8760 hours  CBG: No results found for this basename: GLUCAP:5 in the last 168 hours   Radiological Exams on Admission: Dg Chest 2 View  08/13/2012  *RADIOLOGY REPORT*  Clinical Data: Shortness of breath and cough.  Fever and chest pain.  Metastatic gallbladder cancer.  CHEST - 2 VIEW  Comparison: Acute abdomen series of 06/23/2012.  Findings: Right-sided Port-A-Cath  which terminates at the mid to low SVC. Normal heart size and mediastinal contours for age.  A small right pleural effusion is new since 06/03/2012 CT. No pneumothorax.  Scattered nodular opacities likely correspond pulmonary metastasis on prior CT.  Suspicion of right base air space disease.  IMPRESSION: Development of a small right pleural effusion since 06/03/12. Adjacent subtle airspace disease which could represent atelectasis or early infection.  Consider short-term radiographic follow-up.  Mild pulmonary nodularity which likely corresponds to a metastasis on the prior CT.   Original Report Authenticated By: Jeronimo Greaves, M.D.        Active Problems:  * No active [name] problems. *    Assessment/Plan 1. Pneumonia: Treated as healthcare acquired given failure of outpatient oral antibiotics, current chemotherapy. 2. Metastatic cholangiocarcinoma: Currently receiving chemotherapy. Notify Dr. Myna Hidalgo. 3. Anemia:  Secondary to chemotherapy. 4. History coronary artery disease  Code Status:  Full code, confirmed with patient and family Family Communication:  Discussed with husband, daughter, son at bedside, all questions answered. Disposition Plan/Anticipated LOS: inpatient, 2-4 days.   Time spent:  60  minutes  Sherry Sacks, MD  [facility] Team 4  Pager [phone] If 7PM-7AM, please contact night-coverage at [email or web address], password [name] 08/13/2012, 7:05 PM

## 2012-08-13 NOTE — Addendum Note (Signed)
Addended by: Mirian Capuchin on: 08/13/2012 04:58 PM   Modules accepted: Orders

## 2012-08-13 NOTE — ED Provider Notes (Signed)
History     CSN: [id number]  Arrival date & time 08/13/12  1520   First MD Initiated Contact with Patient 08/13/12 1545      Chief Complaint  Patient presents with  . Cough  . Fever  . Pneumonia     HPI The patient presents with generalized weakness, cough, fever.  Symptoms began approximately 5 days ago, since onset has become more [place] aggressively incapacitating.  No relief with OTC medication.  There is no associated dyspnea, but the progression of her cough, generalized discomfort is incapacitating.  No relief with OTC medication.  No clear exacerbating factors. Notably, the patient has a history of metastatic cholangiocarcinoma.  Last chemotherapy was 3 weeks ago. Patient saw her oncologist today, was referred here for further evaluation. I discussed this referral with the oncologist.    Past Medical History  Diagnosis Date  . Hypertension   . Hyperlipidemia   . GERD (gastroesophageal reflux disease)   . Depression   . Esophageal stricture 2003     [place] post esophageal dilation in 2003 performed by Dr. Marina Goodell, second esophageal dilation performed in 2006  by Dr. Marina Goodell  . Obstructive jaundice due to malignant neoplasm  February 2012     status post ERCP done by Dr. Marina Goodell with biliary sphincterotomy, bile duct stricture brushings, biliary stent placement, performed secondary to obstructive jaundice, malignant-appearing bile duct stricture of the common hepatic duct worrisome for primary cholangiocarcinoma, status post ERCP with biliary sphincterotomy and biliary stent placement...status post biopsy -  markedly atypical cells cons  . Rotator cuff syndrome 2010     per MRI March 2010 there is a prominent articular surface partial tear of the supraspinatus which is nearly full thickness, as well as a tear of the distal anterior supraspinatus which probably represents a full-thickness partial tear,  with moderate supraspinatus tendinopathy, moderate infraspinatus tendinopathy and  prominent undersurface tear of the acromion along the coracoacromial ligament  . Hiatal hernia   . SOB (shortness of breath) on exertion   . Bile duct adenocarcinoma   . Myocardial infarction   . Angina   . Headache   . Arthritis   . Dysrhythmia   . Peripheral vascular disease   . Anemia   . Diarrhea   . Nausea   . Cholangiocarcinoma   . Complication of anesthesia     Past Surgical History  Procedure Date  . Cholecystectomy  2007  .  aortogram   August 2011     ultrasound-guided access to the right common femoral artery, an aortogram with bilateral iliac arteriogram and bilateral lower extremity runoff, this was performed secondary to progressive bilateral lower extremity claudication, left common iliac artery occlusion as well as short segment of right superficial femoral artery occlusion, procedure performed by Dr. Durwin Nora  . Bypass graft  January 2009     of the infected femoral-femoral graft with vein patch angioplasty of bilateral femoral artery, 7 by [name],   .  abdominal aortic angiography  September 2008     performed by Dr. 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 Cardiac catheterization  August 2011     patent coronary arteries with diffuse nonobstructive disease, there are no areas of high grade stenosis present,  determined the chest pain is most likely noncardiac in origin, continue medical management.  . Bile duct surgery  60% of liver removed  . Arthroscopy knee w/ drilling   . Esophagogastroduodenoscopy 07/10/2011    Procedure: ESOPHAGOGASTRODUODENOSCOPY (EGD);  Surgeon: Yancey Flemings, MD;  Location: [facility];  Service: Endoscopy;  Laterality: N/A;  . Liver resection     section of liver resceted due to bile duct ca  . Esophagogastroduodenoscopy 07/20/2011    Procedure:  ESOPHAGOGASTRODUODENOSCOPY (EGD);  Surgeon: Erick Blinks, MD;  Location: [facility];  Service: Gastroenterology;  Laterality: N/A;  . Esophagogastroduodenoscopy 07/31/2011    Procedure: ESOPHAGOGASTRODUODENOSCOPY (EGD);  Surgeon: Yancey Flemings, MD;  Location: [facility];  Service: Endoscopy;  Laterality: N/A;  . Mass excision 03/05/2012    Procedure: EXCISION MASS;  Surgeon: Adolph Pollack, MD;  Location: [facility];  Service: General;  Laterality: N/A;  Right chest wall    Family History  Problem Relation Age of Onset  . Heart disease Brother     History  Substance Use Topics  . Smoking status: Former Smoker    Quit date: 06/14/1999  . Smokeless tobacco: Never Used  . Alcohol Use: No    OB History    Grav Para Term Preterm Abortions TAB SAB Ect Mult Living                  Review of Systems  Constitutional:       Per HPI, otherwise negative  HENT:       Per HPI, otherwise negative  Eyes: Negative.   Respiratory:       Per HPI, otherwise negative  Cardiovascular:       Per HPI, otherwise negative  Gastrointestinal: Positive for nausea and vomiting. Negative for diarrhea.  Genitourinary: Negative.   Musculoskeletal:       Per HPI, otherwise negative  Skin: Negative.   Neurological: Positive for weakness. Negative for syncope and numbness.  Hematological:       History of present illness    Allergies  Promethazine hcl and Sulfonamide derivatives  Home Medications   Current Outpatient Rx  Name  Route  Sig  Dispense  Refill  . ALPHA LIPOIC ACID PO   Oral   Take 1 capsule by mouth every morning.          Marland Kitchen VITAMIN-B COMPLEX PO TABS   Oral   Take by mouth every morning.         Marland Kitchen CEFDINIR 300 MG PO CAPS      Take 2 capsules by mouth once a day   14 capsule   1   . HYDROCOD POLST-CPM POLST ER 10-8 MG/5ML PO LQCR   Oral   Take 5 mLs by mouth every 12 (twelve) hours as needed.   240 mL   0   . GABAPENTIN 300 MG PO CAPS   Oral   Take 2 capsules (600 mg  total) by mouth 3 (three) times daily.   180 capsule   3   . LIDOCAINE-PRILOCAINE 2.5-2.5 % EX CREA      APPLY TO THE AFFECTED AREA AS DIRECTED   30 g   4   . LISINOPRIL 10 MG PO TABS   Oral   Take 10 mg by mouth daily.          Marland Kitchen LORAZEPAM 0.5 MG PO TABS   Oral   Take 1 tablet (0.5 mg total) by mouth every 8 (eight) hours. For nausea   30 tablet   0   . METOCLOPRAMIDE HCL 10 MG PO TABS   Oral   Take 10 mg  by mouth every 6 (six) hours as needed. For nausea and vomiting         . METOPROLOL SUCCINATE ER 50 MG PO TB24   Oral   Take 1 tablet (50 mg total) by mouth daily.   90 tablet   3   . MIRTAZAPINE 15 MG PO TABS   Oral   Take 1 tablet (15 mg total) by mouth at bedtime.   30 tablet   6   . OMEPRAZOLE 20 MG PO TBEC   Oral   Take 1 tablet (20 mg total) by mouth 2 (two) times daily.   180 each   3   . OXYCODONE HCL 5 MG PO CAPS   Oral   Take 1 capsule (5 mg total) by mouth every 6 (six) hours as needed. For pain   30 capsule   0   . SUCRALFATE 1 GM/10ML PO SUSP   Oral   Take 1 g by mouth every 6 (six) hours.           BP 136/79  Pulse 90  Temp 97.6 F (36.4 C) (Oral)  Resp 20  Ht 5\' 5"  (1.651 m)  Wt 142 lb (64.411 kg)  BMI 23.63 kg/m2  SpO2 97%  Physical Exam  Nursing note and vitals reviewed. Constitutional: She is oriented to person, place, and time. She appears well-nourished. She has a sickly appearance.  HENT:  Head: Normocephalic and atraumatic.  Eyes: Conjunctivae normal and EOM are normal.  Cardiovascular: Normal rate and regular rhythm.   Pulmonary/Chest: No stridor. Tachypnea noted. She has decreased breath sounds. She has rhonchi.  Abdominal: She exhibits no distension.  Musculoskeletal: She exhibits no edema.  Neurological: She is alert and oriented to person, place, and time. No cranial nerve deficit.  Skin: Skin is warm and dry.  Psychiatric: She has a normal mood and affect.    ED Course  Procedures (including critical  care time)   Labs Reviewed  LACTIC ACID, PLASMA   Dg Chest 2 View  08/13/2012  *RADIOLOGY REPORT*  Clinical Data: Shortness of breath and cough.  Fever and chest pain.  Metastatic gallbladder cancer.  CHEST - 2 VIEW  Comparison: Acute abdomen series of 06/23/2012.  Findings: Right-sided Port-A-Cath which terminates at the mid to low SVC. Normal heart size and mediastinal contours for age.  A small right pleural effusion is new since 06/03/2012 CT. No pneumothorax.  Scattered nodular opacities likely correspond pulmonary metastasis on prior CT.  Suspicion of right base air space disease.  IMPRESSION: Development of a small right pleural effusion since 06/03/12. Adjacent subtle airspace disease which could represent atelectasis or early infection.  Consider short-term radiographic follow-up.  Mild pulmonary nodularity which likely corresponds to a metastasis on the prior CT.   Original Report Authenticated By: Jeronimo Greaves, M.D.    I discussed the x-ray findings with the oncologist, interpreted the result myself.  Pulse ox 94% room air borderline abnormal   No diagnosis found.    MDM  This patient with metastatic cholangiocarcinoma now presents with ongoing fevers, cough, generalized weakness.  X-ray demonstrates possible pneumonia on exam the patient is a well appearing, with borderline hypoxia, though becomes prominent with minimal exertion, dropping into the upper 80s, low 90s.  The patient is afebrile, but with her multiple comorbidities, she'll be admitted for further evaluation and management.  The patient's oncologist requests admission to [facility].       Gerhard Munch, MD 08/13/12 [phone]

## 2012-08-13 NOTE — ED Notes (Signed)
Report given to [place], [employer]. ETA 30-40 min.

## 2012-08-13 NOTE — ED Notes (Signed)
Pt reports cough, fever and recent diagnosis of pneumonia.  Sent to ED from Dr. Wende Bushy office.

## 2012-08-14 ENCOUNTER — Encounter: Payer: Medicare Other | Admitting: Internal Medicine

## 2012-08-14 DIAGNOSIS — D649 Anemia, unspecified: Secondary | ICD-10-CM

## 2012-08-14 DIAGNOSIS — F329 Major depressive disorder, single episode, unspecified: Secondary | ICD-10-CM

## 2012-08-14 DIAGNOSIS — K219 Gastro-esophageal reflux disease without esophagitis: Secondary | ICD-10-CM

## 2012-08-14 DIAGNOSIS — J101 Influenza due to other identified influenza virus with other respiratory manifestations: Secondary | ICD-10-CM

## 2012-08-14 LAB — BASIC METABOLIC PANEL
BUN: 17 mg/dL (ref 6–23)
Chloride: 104 mEq/L (ref 96–112)
Chloride: 107 mEq/L (ref 96–112)
Creatinine, Ser: 0.97 mg/dL (ref 0.50–1.10)
GFR calc Af Amer: 72 mL/min — ABNORMAL LOW (ref >90)
GFR calc Af Amer: 75 mL/min — ABNORMAL LOW (ref >90)
GFR calc non Af Amer: 62 mL/min — ABNORMAL LOW (ref >90)
GFR calc non Af Amer: 65 mL/min — ABNORMAL LOW (ref >90)
Potassium: 3.3 mEq/L — ABNORMAL LOW (ref 3.5–5.1)
Potassium: 3.7 mEq/L (ref 3.5–5.1)
Sodium: 136 mEq/L (ref 135–145)

## 2012-08-14 LAB — CBC
HCT: 22.7 % — ABNORMAL LOW (ref 36.0–46.0)
HCT: 23.4 % — ABNORMAL LOW (ref 36.0–46.0)
Hemoglobin: 7.5 g/dL — ABNORMAL LOW (ref 12.0–15.0)
MCHC: 33 g/dL (ref 30.0–36.0)
MCHC: 33.3 g/dL (ref 30.0–36.0)
Platelets: 128 10*3/uL — ABNORMAL LOW (ref 150–400)
RDW: 15.6 % — ABNORMAL HIGH (ref 11.5–15.5)
RDW: 15.9 % — ABNORMAL HIGH (ref 11.5–15.5)
WBC: 4.7 10*3/uL (ref 4.0–10.5)
WBC: 4.9 10*3/uL (ref 4.0–10.5)

## 2012-08-14 LAB — INFLUENZA PANEL BY PCR (TYPE A & B): Influenza B By PCR: NEGATIVE

## 2012-08-14 LAB — STREP PNEUMONIAE URINARY ANTIGEN: Strep Pneumo Urinary Antigen: NEGATIVE

## 2012-08-14 MED ORDER — ENSURE COMPLETE PO LIQD
237.0000 mL | Freq: Three times a day (TID) | ORAL | Status: DC
  Administered 2012-08-14 – 2012-08-18 (×7): 237 mL via ORAL

## 2012-08-14 MED ORDER — FUROSEMIDE 10 MG/ML IJ SOLN
10.0000 mg | Freq: Once | INTRAMUSCULAR | Status: AC
  Administered 2012-08-14: 10 mg via INTRAVENOUS
  Filled 2012-08-14: qty 1

## 2012-08-14 MED ORDER — ACETAMINOPHEN 325 MG PO TABS
650.0000 mg | ORAL_TABLET | Freq: Once | ORAL | Status: AC
  Administered 2012-08-14: 650 mg via ORAL
  Filled 2012-08-14: qty 2

## 2012-08-14 MED ORDER — SODIUM CHLORIDE 0.9 % IJ SOLN
10.0000 mL | INTRAMUSCULAR | Status: DC | PRN
  Administered 2012-08-18 (×2): 10 mL

## 2012-08-14 MED ORDER — METOCLOPRAMIDE HCL 10 MG PO TABS
10.0000 mg | ORAL_TABLET | Freq: Four times a day (QID) | ORAL | Status: DC | PRN
  Administered 2012-08-14: 10 mg via ORAL
  Filled 2012-08-14: qty 1

## 2012-08-14 NOTE — Progress Notes (Signed)
[facility] PROGRESS NOTE  ALISIA VANENGEN JYN:[id number] DOB: 11/02/1950 DOA: 08/13/2012 PCP: Genella Mech, MD  Assessment/Plan: 1-SOB/general malaise secondary to PNA and Influenza: PCR positive for H1N1 Influenza A. Also with CXR demonstrating concerns for PNA. Will continue current antibiotics for HCAP and continue tamiflu. Follow cultures.  2-Metastatic cholangiocarcinoma: per oncology; will follow recommendations. Chemotherapy on hold for now until patient recovers from acute infection  3-Anemia: per oncology recommendation will transfuse 2 units of PRBC's; will follow Hgb trend  4-Thrombocytopenia: patient with low platelets probably associated with chemotherapy. Will hold heparin products and follow level with CBC in am.  5-GERD: continue PPI  6-anorexia and depression: continue remeron and encourage PO intake  7-severe protein calorie malnutrition: will start ensure TID.  DVT: SCD's  Code Status: Full Family Communication: no family at bedside Disposition Plan: will like to go home when medically stable.    Consultants:  Oncology  Procedures:  CXR see below for reports  Antibiotics:  Vanc, cefepime and levaquin  HPI/Subjective: Afebrile, no nausea, no vomiting; positive Flu by PCR  Objective: Filed Vitals:   08/14/12 0547 08/14/12 1343 08/14/12 1425 08/14/12 1525  BP: 111/69 107/67 95/59 108/87  Pulse: 72 85 80 79  Temp: 97.8 F (36.6 C) 98.3 F (36.8 C) 98.6 F (37 C) 98.6 F (37 C)  TempSrc: Oral Oral Oral Oral  Resp: 16 20 20 20   Height:      Weight:      SpO2: 100%       Intake/Output Summary (Last 24 hours) at 08/14/12 1551 Last data filed at 08/14/12 1525  Gross per 24 hour  Intake 1157.5 ml  Output    350 ml  Net  807.5 ml   Filed Weights   08/13/12 1534 08/13/12 1856  Weight: 64.411 kg (142 lb) 63.957 kg (141 lb)    Exam:   General:  Weak, ill appearing; complaining of general malaise and mild SOB  Cardiovascular: S1  and S2; no rubs  Respiratory: positive rhonchi, no crackles, no wheezing; decrease BS at bases (R>L)  Abdomen: soft, NT, ND, positive BS  Extremities: no edema, no cyanosis  Neuro: no focal deficit  Data Reviewed: Basic Metabolic Panel:  Lab 08/14/12 8657 08/14/12 0005  NA 139 136  K 3.7 3.3*  CL 107 104  CO2 26 24  GLUCOSE 103* 111*  BUN 17 19  CREATININE 0.97 0.94  CALCIUM 8.9 8.7  MG -- --  PHOS -- --   CBC:  Lab 08/14/12 0435 08/14/12 0005 08/13/12 1334  WBC 4.7 4.9 6.5  NEUTROABS -- -- 5.2  HGB 7.5* 7.8* 9.2*  HCT 22.7* 23.4* 28.7*  MCV 92.7 92.5 96  PLT 120* 128* 142*    Studies: Dg Chest 2 View  08/13/2012  *RADIOLOGY REPORT*  Clinical Data: Shortness of breath and cough.  Fever and chest pain.  Metastatic gallbladder cancer.  CHEST - 2 VIEW  Comparison: Acute abdomen series of 06/23/2012.  Findings: Right-sided Port-A-Cath which terminates at the mid to low SVC. Normal heart size and mediastinal contours for age.  A small right pleural effusion is new since 06/03/2012 CT. No pneumothorax.  Scattered nodular opacities likely correspond pulmonary metastasis on prior CT.  Suspicion of right base air space disease.  IMPRESSION: Development of a small right pleural effusion since 06/03/12. Adjacent subtle airspace disease which could represent atelectasis or early infection.  Consider short-term radiographic follow-up.  Mild pulmonary nodularity which likely corresponds to a metastasis on the prior CT.  Original Report Authenticated By: Jeronimo Greaves, M.D.     Scheduled Meds:   . ceFEPime (MAXIPIME) IV  1 g Intravenous Q8H  . docusate sodium  100 mg Oral BID  . enoxaparin (LOVENOX) injection  40 mg Subcutaneous Q24H  . furosemide  10 mg Intravenous Once  . gabapentin  600 mg Oral TID  . levofloxacin (LEVAQUIN) IV  750 mg Intravenous Q24H  . metoprolol succinate  50 mg Oral Daily  . mirtazapine  15 mg Oral QHS  . oseltamivir  75 mg Oral BID  . pantoprazole  40 mg  Oral BID  . sodium chloride  3 mL Intravenous Q12H  . vancomycin  750 mg Intravenous BID    Time spent: >30 minutes    Prerna Harold  [facility] Pager [phone]. If 8PM-8AM, please contact night-coverage at [email or web address], password [facility] 08/14/2012, 3:51 PM  LOS: 1 day

## 2012-08-14 NOTE — Progress Notes (Signed)
INITIAL NUTRITION ASSESSMENT  DOCUMENTATION CODES Per approved criteria  -Severe malnutrition in the context of acute illness or injury   INTERVENTION: 1.  General healthful diet; encourage PO intake as able.  Pt is currently on Regular diet with Ensure Complete TID.  She is on remeron which may stimulate appetite.  She has colace, ducolax,, reglan, and miralax for bowel regimen and nausea.    NUTRITION DIAGNOSIS: Unintended wt loss related to nausea, poor PO as evidenced by pt with 8% wt loss.    Monitor:  1.  Food/Beverage; improvement in intake to >/=75% of meals to meet >/=90% of estimated needs 2.  Wt/wt change; deter further loss 3.  Gastrointestinal; adequate nausea management.  Reason for Assessment: MST  62 y.o. female  Admitting Dx: <principal problem not specified>  ASSESSMENT: Pt admitted with cough, pneumonia, nausea.  Pt with metastatic cholangiocarcinoma with ongoing chemotherapy. Pt with poor PO intake PTA leading to 12 lbs wt loss in 1 month (8% of usual wt) which is clinically significant.  Wt stable prior to the past month. Pt unavailable at time of visit. RD to follow closely for pt with significant wt loss, ongoing treatment, and reported poor PO.  Height: Ht Readings from Last 1 Encounters:  08/13/12 5\' 5"  (1.651 m)    Weight: Wt Readings from Last 1 Encounters:  08/13/12 141 lb (63.957 kg)    Ideal Body Weight: 125 lbs  % Ideal Body Weight: 112%  Wt Readings from Last 10 Encounters:  08/13/12 141 lb (63.957 kg)  08/13/12 142 lb (64.411 kg)  07/30/12 144 lb (65.318 kg)  07/16/12 151 lb (68.493 kg)  06/27/12 163 lb 5.8 oz (74.1 kg)  06/11/12 154 lb (69.854 kg)  06/05/12 156 lb 8 oz (70.988 kg)  05/27/12 156 lb (70.761 kg)  05/13/12 153 lb (69.4 kg)  05/11/12 154 lb 3.2 oz (69.945 kg)    Usual Body Weight: 153-156 lbs  % Usual Body Weight: 92%  BMI:  Body mass index is 23.46 kg/(m^2).  Estimated Nutritional Needs: Kcal:  1980-2050 Protein: 83-95g Fluid: ~2.0 L/day  Skin: intact  Diet Order: General  EDUCATION NEEDS: -Education needs addressed   Intake/Output Summary (Last 24 hours) at 08/14/12 1558 Last data filed at 08/14/12 1525  Gross per 24 hour  Intake 1157.5 ml  Output    350 ml  Net  807.5 ml    Last BM: 1/2  Labs:   Lab 08/14/12 0435 08/14/12 0005  NA 139 136  K 3.7 3.3*  CL 107 104  CO2 26 24  BUN 17 19  CREATININE 0.97 0.94  CALCIUM 8.9 8.7  MG -- --  PHOS -- --  GLUCOSE 103* 111*    CBG (last 3)  No results found for this basename: GLUCAP:3 in the last 72 hours  Scheduled Meds:   . ceFEPime (MAXIPIME) IV  1 g Intravenous Q8H  . docusate sodium  100 mg Oral BID  . feeding supplement  237 mL Oral TID BM  . furosemide  10 mg Intravenous Once  . gabapentin  600 mg Oral TID  . levofloxacin (LEVAQUIN) IV  750 mg Intravenous Q24H  . metoprolol succinate  50 mg Oral Daily  . mirtazapine  15 mg Oral QHS  . oseltamivir  75 mg Oral BID  . pantoprazole  40 mg Oral BID  . sodium chloride  3 mL Intravenous Q12H  . vancomycin  750 mg Intravenous BID    Continuous Infusions:   Past Medical  History  Diagnosis Date  . Hypertension   . Hyperlipidemia   . GERD (gastroesophageal reflux disease)   . Depression   . Esophageal stricture 2003     [place] post esophageal dilation in 2003 performed by Dr. Marina Goodell, second esophageal dilation performed in 2006  by Dr. Marina Goodell  . Obstructive jaundice due to malignant neoplasm  February 2012     status post ERCP done by Dr. Marina Goodell with biliary sphincterotomy, bile duct stricture brushings, biliary stent placement, performed secondary to obstructive jaundice, malignant-appearing bile duct stricture of the common hepatic duct worrisome for primary cholangiocarcinoma, status post ERCP with biliary sphincterotomy and biliary stent placement...status post biopsy -  markedly atypical cells cons  . Rotator cuff syndrome 2010     per MRI March  2010 there is a prominent articular surface partial tear of the supraspinatus which is nearly full thickness, as well as a tear of the distal anterior supraspinatus which probably represents a full-thickness partial tear,  with moderate supraspinatus tendinopathy, moderate infraspinatus tendinopathy and prominent undersurface tear of the acromion along the coracoacromial ligament  . Hiatal hernia   . SOB (shortness of breath) on exertion   . Bile duct adenocarcinoma   . Myocardial infarction   . Angina   . Headache   . Arthritis   . Dysrhythmia   . Peripheral vascular disease   . Anemia   . Diarrhea   . Nausea   . Cholangiocarcinoma   . Complication of anesthesia   . Pneumonia 08/13/2012    Past Surgical History  Procedure Date  . Cholecystectomy  2007  .  aortogram   August 2011     ultrasound-guided access to the right common femoral artery, an aortogram with bilateral iliac arteriogram and bilateral lower extremity runoff, this was performed secondary to progressive bilateral lower extremity claudication, left common iliac artery occlusion as well as short segment of right superficial femoral artery occlusion, procedure performed by Dr. Durwin Nora  . Bypass graft  January 2009     of the infected femoral-femoral graft with vein patch angioplasty of bilateral femoral artery, 7 by [name],   .  abdominal aortic angiography  September 2008     performed by Dr. 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 Cardiac catheterization  August 2011     patent coronary arteries with diffuse nonobstructive disease, there are no areas of high grade stenosis present,  determined the chest pain is most likely noncardiac in origin, continue medical management.  . Bile duct surgery     60% of liver removed  . Arthroscopy knee  w/ drilling   . Esophagogastroduodenoscopy 07/10/2011    Procedure: ESOPHAGOGASTRODUODENOSCOPY (EGD);  Surgeon: Yancey Flemings, MD;  Location: [facility];  Service: Endoscopy;  Laterality: N/A;  . Liver resection     section of liver resceted due to bile duct ca  . Esophagogastroduodenoscopy 07/20/2011    Procedure: ESOPHAGOGASTRODUODENOSCOPY (EGD);  Surgeon: Erick Blinks, MD;  Location: [facility];  Service: Gastroenterology;  Laterality: N/A;  . Esophagogastroduodenoscopy 07/31/2011    Procedure: ESOPHAGOGASTRODUODENOSCOPY (EGD);  Surgeon: Yancey Flemings, MD;  Location: [facility];  Service: Endoscopy;  Laterality: N/A;  . Mass excision 03/05/2012    Procedure: EXCISION MASS;  Surgeon: Adolph Pollack, MD;  Location: [facility] OR;  Service: General;  Laterality: N/A;  Right chest wall  Brynda Greathouse, MS RD LDN Clinical Inpatient Dietitian Pager: [phone] Weekend/After hours pager: [phone]

## 2012-08-14 NOTE — Consult Note (Signed)
I greatly appreciate the hospitalist for help him out with [name]   She is very well known to me. She is a 62 year old African female with metastatic plan he'll carcinoma. She has done incredibly well. She initially was diagnosed back in March of 2012.  She is on third line therapy with weekly Abraxane. She had her last treatment back on December 19.  She had been on antibiotics since the 26. Her on-call says she had a cough. She reports he hasn't gotten worse. She was seen in the office on the day of admission. She had chest x-ray which showed a right lower lobe infiltrate. She is dehydrated. She is not eating well.  She still has been admitted patient session on IV fluids. She is on IV antibiotics with Maxipime/Levaquin/vancomycin. Cultures are pending.  She looks a little bit better. She is coughing up a little t more. She sounds stronger.  She denies any obvious fever or chills. There is some nausea. She's not noticed any obvious melena or bright her blood parenchyma. 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  Her past medical history is remarkable for metastatic cholangiocarcinoma, hypertension, hyperlipidemia, peripheral vascular disease, coronary artery disease, osteoarthritis.  Allergies are sulfa was in promethazine.  Admission medications are documented in the medical record.  On her physical exam this is a somewhat ill-appearing black female in no obvious distress. She is alert and oriented x3. Vital signs show a temperature of 97.8 pulse 72, respiratory 16, blood pressure 111/69 ,oxygen saturation is 100%.  Oral exam shows some dry oral mucosa. No mucositis or thrush is noted. No adenopathy is noted in the neck. Lungs are clear by laterally. Some wheezes are noted. Cardiac exam regular rate and rhythm with a normal S1 -- 2. There are no murmurs rubs or bruits. Abdominal exam shows  laparotomy scar. She has decent bowel sounds. There is no distention. There is no palpable will hepatospleno megaly. Extremities shows no clubbing cyanosis or edema.   Laboratory studies show a white cell count of 4.7 hemoglobin 7.5 hematocrit 22.7 platelet count 120.  Sodium 139 potassium 3.7 BUN 17 creatinine 0.97. Calcium 8.9.  [name] is a 62 year old African female with pneumonia. This is a community-acquired pneumonia. She is not immunocompromised as her white cell count is okay.  She is anemic. She will benefit from 2 units of packed red blood cells. It's not surprising that she is anemic. Her she's had quite a bit of chemotherapy. Her bone marrow is quite sensitive to any stress.  She should be able to recover from this. I suspect that she likely will be in the hospital for about 4-5 days.  We need to be aggressive with anti-nausea medications. Nutrition is definitely the key for her. Hopefully as she recovers, she'll have a better appetite.  Again, I appreciate the wonderful care that she is getting by the hospitalist in by the nurses up on 4 E.  We will follow along while she is hospitalized.  [name] [phone]

## 2012-08-14 NOTE — Progress Notes (Signed)
CC:   Sherry Mech, MD  DIAGNOSIS: 1. Metastatic cholangiocarcinoma. 2. Right lower lobe pneumonia, current therapy weekly, Abraxane (2     weeks on/1 week off) -- last dose on 12/19.  INTERIM HISTORY:  [name] comes in for a followup.  Unfortunately, she is quite ill today.  Her husband had called last Friday saying that she had a cough.  We went ahead and prescribed some antibiotics for her. She seemed to get a little bit better, but then got worse.  She comes in today.  She can barely walk.  She is not eating that much.  She is not coughing up a lot.  She has had a temperature.  Apparently, on the weekend, she said her temperature was 104.  She appears somewhat dehydrated.  Again, she is not eating.  She has had some nausea, but no vomiting.  There has been some rare episodes of diarrhea.  She has had no bleeding.  There has been no leg swelling.  We did do a chest x-ray on her today.  The chest x-ray did show an infiltrate in the right lower lobe.  She had some pulmonary nodules which really did not appear to be a problem or any changes.  PHYSICAL EXAMINATION:  General:  This is an acutely ill-appearing black female in no obvious distress.  Vital signs:  Show a temperature of 97.8, pulse 84, respiratory rate 24, blood pressure 109/77, weight is 142.  Oxygen saturation on room air is 93%.  Head and neck:  Shows a normocephalic, atraumatic skull.  There are no oral lesions.  Oral mucosa is dry.  No adenopathy noted in the neck.  Lungs:  With crackles and wheezes bilaterally.  She has some rhonchi bilaterally.  Cardiac: Regular rate and rhythm with a normal S1 and S2.  There are no murmurs, rubs or bruits.  Abdomen:  Soft with good bowel sounds.  There is no palpable abdominal mass.  There is no fluid wave.  There is no palpable hepatosplenomegaly.  Extremities:  Shows no clubbing, no cyanosis or edema.  She has symmetric muscle weakness in the upper and  lower extremities.  Neurological:  Shows no focal neurological deficits.  LABORATORY STUDIES:  White count 6.5, hemoglobin 9.2, hematocrit 28.7, platelet count is 142.  IMPRESSION:  [name] is a 62 year old African American female with metastatic cholangiocarcinoma.  She is on Abraxane.  Unfortunately, I think she is going to have to be admitted.  I think she may end up in the ICU.  As such, I think we need to get the hospitalist to help [place] out on this one.  I spoke to the emergency room doctor.  He graciously will accept the patient and arrange for her to be admitted.  I will find her in the hospital.  We will certainly help out while she is in the hospital.  Obviously, chemotherapy will have to be put on hold for now.  Hopefully, should will be able to take treatment in a couple of weeks.    ______________________________ Josph Macho, M.D. PRE/MEDQ  D:  08/13/2012  T:  08/14/2012  Job:  [id number]

## 2012-08-14 NOTE — Care Management Note (Signed)
    Page 1 of 1   08/18/2012     11:24:29 AM   CARE MANAGEMENT NOTE 08/18/2012  Patient:  MALIYAH, WILLETS   Account Number:  [id number]  Date Initiated:  08/14/2012  Documentation initiated by:  Lanier Clam  Subjective/Objective Assessment:   ADMITTED W/COUGH.PNA.     Action/Plan:   FROM HOME W/SPOUSE.HAS CANE.HAS PCP,PHARMACY.   Anticipated DC Date:  08/18/2012   Anticipated DC Plan:  HOME/SELF CARE      DC Planning Services  CM consult      Choice offered to / List presented to:  C-1 Patient   DME arranged  Levan Hurst      DME agency  [facility]        Status of service:  Completed, signed off Medicare Important Message given?   (If response is "NO", the following Medicare IM given date fields will be blank) Date Medicare IM given:   Date Additional Medicare IM given:    Discharge Disposition:  HOME/SELF CARE  Per UR Regulation:  Reviewed for med. necessity/level of care/duration of stay  If discussed at Long Length of Stay Meetings, dates discussed:    Comments:  08/18/12 Dulcy Sida RN,BSN NCM [phone] [facility] DME [place] FOLLOWING FOR RW.NO FURTHER DME, OR D/C NEEDS.  08/17/12 Ayumi Wangerin RN,BSN NCM [phone] PT-NO F/U.CONGESTION-NEBS, PO ABX.ONC FOLLOWING.NO ANTICIPATED D/C NEEDS.  08/14/12 Mialani Reicks RN,BSN NCM [phone]

## 2012-08-15 LAB — TYPE AND SCREEN
Antibody Screen: NEGATIVE
Unit division: 0

## 2012-08-15 LAB — CBC
HCT: 30.3 % — ABNORMAL LOW (ref 36.0–46.0)
MCHC: 34.3 g/dL (ref 30.0–36.0)
MCV: 90.7 fL (ref 78.0–100.0)
RDW: 16.3 % — ABNORMAL HIGH (ref 11.5–15.5)

## 2012-08-15 LAB — COMPREHENSIVE METABOLIC PANEL
ALT: 12 U/L (ref 0–35)
AST: 21 U/L (ref 0–37)
Alkaline Phosphatase: 76 U/L (ref 39–117)
CO2: 25 mEq/L (ref 19–32)
Calcium: 9 mg/dL (ref 8.4–10.5)
Chloride: 108 mEq/L (ref 96–112)
GFR calc Af Amer: 66 mL/min — ABNORMAL LOW (ref >90)
GFR calc non Af Amer: 57 mL/min — ABNORMAL LOW (ref >90)
Glucose, Bld: 85 mg/dL (ref 70–99)
Potassium: 3.7 mEq/L (ref 3.5–5.1)

## 2012-08-15 MED ORDER — BISACODYL 10 MG RE SUPP
10.0000 mg | Freq: Once | RECTAL | Status: AC
  Administered 2012-08-15: 10 mg via RECTAL
  Filled 2012-08-15: qty 1

## 2012-08-15 MED ORDER — POLYETHYLENE GLYCOL 3350 17 G PO PACK
17.0000 g | PACK | Freq: Two times a day (BID) | ORAL | Status: DC
  Administered 2012-08-15 – 2012-08-17 (×3): 17 g via ORAL
  Filled 2012-08-15 (×8): qty 1

## 2012-08-15 NOTE — Progress Notes (Signed)
[facility] PROGRESS NOTE  [name] ZOX:[id number] DOB: [birth year] DOA: 08/13/2012 PCP: Sherry Mech, MD  Assessment/Plan: 1-SOB/general malaise secondary to PNA and Influenza: PCR positive for H1N1 Influenza A. Also with CXR demonstrating infiltrates concerning for PNA. Patient continued improving and has remained afebrile. Will continue current IV antibiotics for 2 more days before switching to oral treatment.  2-Metastatic cholangiocarcinoma: per oncology; will follow recommendations. Chemotherapy on hold for now until patient recovers from acute infection  3-Anemia: per oncology recommendation patient has received 2 units of PRBC's; Hgb 10.4 now  4-Thrombocytopenia: patient with low platelets probably associated with chemotherapy. Will hold heparin products and follow level with CBC in am.  5-GERD: continue PPI  6-anorexia and depression: continue remeron and encourage PO intake  7-severe protein calorie malnutrition: will continue ensure TID. Appreciate nutrition service recommendations and assistance.  8-constipation: Continue MiraLAX and Colace. Will also use Dulcolax suppository when necessary.  DVT: SCD's  Code Status: Full Family Communication: no family at bedside Disposition Plan: will like to go home when medically stable.    Consultants:  Oncology  Procedures:  CXR see below for reports  Antibiotics:  Vanc, cefepime and levaquin  HPI/Subjective: Afebrile. Feeling a little better. Still complaining of significant weakness and also constipation.  Objective: Filed Vitals:   08/14/12 1940 08/14/12 2040 08/15/12 0452 08/15/12 1448  BP: 105/70 106/68 128/84 123/86  Pulse: 72 69 79 79  Temp: 98 F (36.7 C) 98 F (36.7 C) 98.6 F (37 C) 99 F (37.2 C)  TempSrc: Oral Oral Oral Oral  Resp: 20 20 18 18   Height:      Weight:      SpO2:   100% 100%    Intake/Output Summary (Last 24 hours) at 08/15/12 1645 Last data filed at 08/15/12 1434  Gross per 24 hour  Intake   1075 ml  Output   1450 ml  Net   -375 ml   Filed Weights   08/13/12 1534 08/13/12 1856  Weight: 64.411 kg (142 lb) 63.957 kg (141 lb)    Exam:   General:  Weak, ill appearing; afebrile and feeling slightly better. Complaining of constipation  Cardiovascular: S1 and S2; no rubs  Respiratory: positive rhonchi, no crackles, no wheezing; decrease BS at bases (R>L)  Abdomen: soft, NT, ND, positive BS  Extremities: no edema, no cyanosis  Neuro: no focal deficit  Data Reviewed: Basic Metabolic Panel:  Lab 08/15/12 8119 08/14/12 0435 08/14/12 0005  NA 141 139 136  K 3.7 3.7 3.3*  CL 108 107 104  CO2 25 26 24   GLUCOSE 85 103* 111*  BUN 12 17 19   CREATININE 1.05 0.97 0.94  CALCIUM 9.0 8.9 8.7  MG -- -- --  PHOS -- -- --   CBC:  Lab 08/15/12 0420 08/14/12 0435 08/14/12 0005 08/13/12 1334  WBC 6.1 4.7 4.9 6.5  NEUTROABS -- -- -- 5.2  HGB 10.4* 7.5* 7.8* 9.2*  HCT 30.3* 22.7* 23.4* 28.7*  MCV 90.7 92.7 92.5 96  PLT 122* 120* 128* 142*    Studies: No results found.  Scheduled Meds:    . ceFEPime (MAXIPIME) IV  1 g Intravenous Q8H  . docusate sodium  100 mg Oral BID  . feeding supplement  237 mL Oral TID BM  . gabapentin  600 mg Oral TID  . levofloxacin (LEVAQUIN) IV  750 mg Intravenous Q24H  . metoprolol succinate  50 mg Oral Daily  . mirtazapine  15 mg Oral QHS  . oseltamivir  75 mg Oral BID  . pantoprazole  40 mg Oral BID  . polyethylene glycol  17 g Oral BID  . sodium chloride  3 mL Intravenous Q12H  . vancomycin  750 mg Intravenous BID    Time spent: >30 minutes    [name]  [facility] Pager [phone]. If 8PM-8AM, please contact night-coverage at [email or web address], password [facility] 08/15/2012, 4:45 PM  LOS: 2 days

## 2012-08-16 DIAGNOSIS — I10 Essential (primary) hypertension: Secondary | ICD-10-CM

## 2012-08-16 LAB — CBC
MCH: 30.7 pg (ref 26.0–34.0)
MCHC: 33.5 g/dL (ref 30.0–36.0)
MCV: 91.4 fL (ref 78.0–100.0)
Platelets: 134 10*3/uL — ABNORMAL LOW (ref 150–400)
RBC: 3.39 MIL/uL — ABNORMAL LOW (ref 3.87–5.11)

## 2012-08-16 MED ORDER — ALTEPLASE 2 MG IJ SOLR
2.0000 mg | Freq: Once | INTRAMUSCULAR | Status: AC
  Administered 2012-08-16: 2 mg
  Filled 2012-08-16 (×2): qty 2

## 2012-08-16 MED ORDER — LEVOFLOXACIN 750 MG PO TABS
750.0000 mg | ORAL_TABLET | Freq: Every day | ORAL | Status: DC
  Administered 2012-08-17 – 2012-08-18 (×2): 750 mg via ORAL
  Filled 2012-08-16 (×2): qty 1

## 2012-08-16 MED ORDER — IPRATROPIUM BROMIDE 0.02 % IN SOLN
0.5000 mg | Freq: Four times a day (QID) | RESPIRATORY_TRACT | Status: DC | PRN
  Administered 2012-08-16: 0.5 mg via RESPIRATORY_TRACT
  Filled 2012-08-16: qty 2.5

## 2012-08-16 MED ORDER — ALBUTEROL SULFATE (5 MG/ML) 0.5% IN NEBU
2.5000 mg | INHALATION_SOLUTION | Freq: Four times a day (QID) | RESPIRATORY_TRACT | Status: DC | PRN
  Administered 2012-08-16: 2.5 mg via RESPIRATORY_TRACT
  Filled 2012-08-16: qty 0.5

## 2012-08-16 NOTE — Evaluation (Signed)
Physical Therapy Evaluation Patient Details Name: [name] MRN: [id number] DOB: 1951-06-13 Today's Date: 08/16/2012 Time: 8469-6295 PT Time Calculation (min): 20 min  PT Assessment / Plan / Recommendation Clinical Impression  62 yo female with hx ca admitted with pna and H1N1.  She is improving, but is deconditioned with ataxia of gait requiring the use of a RW.  Anticipate pt will improve spontaneously with her mobiity as she recovers and will not need follow up PT.  Pt and daughter were instructed to call MD for HHPT or outpatient PT if they are not improving in functional mobility and strength as they woudl hope to on their own.    PT Assessment  Patent does not need any further PT services. Recommend that patient walk with RW with nursing/ family several times a day while on nursing unit.    Follow Up Recommendations       Does the patient have the potential to tolerate intense rehabilitation      Barriers to Discharge        Equipment Recommendations  None recommended by PT    Recommendations for Other Services     Frequency      Precautions / Restrictions Precautions Precautions: Fall   Pertinent Vitals/Pain No c/o pain      Mobility  Bed Mobility Bed Mobility: Supine to Sit;Sit to Supine Supine to Sit: 7: Independent Sit to Supine: 7: Independent Transfers Transfers: Sit to Stand;Stand to Sit Sit to Stand: 7: Independent Stand to Sit: 7: Independent Ambulation/Gait Ambulation/Gait Assistance: 5: Supervision;4: Min guard Ambulation Distance (Feet): 400 Feet Assistive device: Rolling walker Ambulation/Gait Assistance Details: tactile cues at left lateral ribs for stability Gait Pattern: Ataxic Gait velocity: WFL General Gait Details: attempted to walk without RW, but pt too ataxic.  She improved in steadiness with RW even though she still had some errant foot placement Stairs: No Wheelchair Mobility Wheelchair Mobility: No    Shoulder Instructions     Exercises Other Exercises Other Exercises: pt instructed to do frequent short bursts of activity at home to increase tolerance   PT Diagnosis:    PT Problem List:   PT Treatment Interventions:     PT Goals    Visit Information  Last PT Received On: 08/16/12 Assistance Needed: +1    Subjective Data  Subjective: "I think I'll be allright" Patient Stated Goal: to go home with family   Prior Functioning  Home Living Lives With: Spouse Available Help at Discharge: Family;Available PRN/intermittently Type of Home: House Home Access: Level entry Home Layout: One level Home Adaptive Equipment: Walker - rolling;Straight cane Prior Function Level of Independence: Independent with assistive device(s) Comments: used a cane or a RW intermittently Communication Communication: No difficulties    Cognition  Overall Cognitive Status: Appears within functional limits for tasks assessed/performed Arousal/Alertness: Awake/alert Orientation Level: Appears intact for tasks assessed Behavior During Session: [facility] for tasks performed    Extremity/Trunk Assessment Right Lower Extremity Assessment RLE ROM/Strength/Tone: [facility] for tasks assessed Left Lower Extremity Assessment LLE ROM/Strength/Tone: [facility] for tasks assessed Trunk Assessment Trunk Assessment: Normal   Balance Balance Balance Assessed: Yes Static Sitting Balance Static Sitting - Balance Support: No upper extremity supported;Feet supported Static Sitting - Level of Assistance: 7: Independent Static Standing Balance Static Standing - Balance Support: During functional activity;No upper extremity supported;Bilateral upper extremity supported Static Standing - Level of Assistance: 7: Independent Static Standing - Comment/# of Minutes: able to stand independently,and perform UE ROM exercise.  PT began to  lose her balance with gait  End of Session PT - End of Session Activity Tolerance: Patient tolerated treatment well Patient left:  in bed;with family/visitor present Nurse Communication: Mobility status  GP     Rosey Bath K. [place], [place] [phone] 08/16/2012, 4:51 PM

## 2012-08-16 NOTE — Progress Notes (Signed)
[facility] PROGRESS NOTE  [name] AVW:[id number] DOB: [birth year] DOA: 08/13/2012 PCP: Genella Mech, MD  Assessment/Plan: 1-SOB/general malaise secondary to PNA and Influenza: PCR positive for H1N1 Influenza A. Also with CXR demonstrating infiltrates concerning for PNA. Patient continued improving and has remained afebrile. Will continue current IV antibiotics for 1 more day. Tomorrow will switch to Levaquin PO. Continue Tamiflu.  2-Metastatic cholangiocarcinoma: per oncology; will follow recommendations. Chemotherapy on hold for now until patient recovers from acute infection  3-Anemia: per oncology recommendation patient has received 2 units of PRBC's; Hgb remains stable at 10.4; had 2 episodes of positive BRPR after finally moving her bowels; will monitor.  4-Thrombocytopenia: patient with low platelets probably associated with chemotherapy and infection. Will hold heparin products and follow platelets trend. Is 134 today.  5-GERD: continue PPI  6-anorexia and depression: continue remeron and encourage PO intake  7-severe protein calorie malnutrition: will continue ensure TID. Appreciate nutrition service recommendations and assistance.  8-constipation: Resolved. Will continue bowel regimen.  9-BRPR: patient with HX of internal hemorrhoids and also diverticulosis; no significant bleeding as Hgb has remains stable overnight. Will monitor and if bleeding continue will involved GI.  DVT: SCD's  Code Status: Full Family Communication: no family at bedside Disposition Plan: will like to go home when medically stable.    Consultants:  Oncology  Procedures:  CXR see below for reports  Antibiotics:  Vanc and cefepime 1/2--1/5  levaquin 1/2  HPI/Subjective: Afebrile. Feeling better. Reports having multiple BM overnight. Endorses BRBPR.  Objective: Filed Vitals:   08/14/12 2040 08/15/12 0452 08/15/12 1448 08/15/12 2059  BP: 106/68 128/84 123/86 107/77    Pulse: 69 79 79 78  Temp: 98 F (36.7 C) 98.6 F (37 C) 99 F (37.2 C) 98.5 F (36.9 C)  TempSrc: Oral Oral Oral Oral  Resp: 20 18 18 16   Height:      Weight:      SpO2:  100% 100% 98%    Intake/Output Summary (Last 24 hours) at 08/16/12 1259 Last data filed at 08/16/12 0030  Gross per 24 hour  Intake    200 ml  Output    400 ml  Net   -200 ml   Filed Weights   08/13/12 1534 08/13/12 1856  Weight: 64.411 kg (142 lb) 63.957 kg (141 lb)    Exam:   General:  Weak, ill appearing; afebrile and feeling. Reports multiple episodes of BM and also some BRB in her stool.  Cardiovascular: S1 and S2; no rubs  Respiratory: improve air movement; scattered rhonchi, no crackles, positive wheezing; decrease BS at bases (R>L)  Abdomen: soft, NT, ND, positive BS  Extremities: no edema, no cyanosis  Neuro: no focal deficit  Data Reviewed: Basic Metabolic Panel:  Lab 08/15/12 8295 08/14/12 0435 08/14/12 0005  NA 141 139 136  K 3.7 3.7 3.3*  CL 108 107 104  CO2 25 26 24   GLUCOSE 85 103* 111*  BUN 12 17 19   CREATININE 1.05 0.97 0.94  CALCIUM 9.0 8.9 8.7  MG -- -- --  PHOS -- -- --   CBC:  Lab 08/16/12 0442 08/15/12 0420 08/14/12 0435 08/14/12 0005 08/13/12 1334  WBC 7.0 6.1 4.7 4.9 6.5  NEUTROABS -- -- -- -- 5.2  HGB 10.4* 10.4* 7.5* 7.8* 9.2*  HCT 31.0* 30.3* 22.7* 23.4* 28.7*  MCV 91.4 90.7 92.7 92.5 96  PLT 134* 122* 120* 128* 142*    Studies: No results found.  Scheduled Meds:    .  docusate sodium  100 mg Oral BID  . feeding supplement  237 mL Oral TID BM  . gabapentin  600 mg Oral TID  . levofloxacin (LEVAQUIN) IV  750 mg Intravenous Q24H  . metoprolol succinate  50 mg Oral Daily  . mirtazapine  15 mg Oral QHS  . oseltamivir  75 mg Oral BID  . pantoprazole  40 mg Oral BID  . polyethylene glycol  17 g Oral BID  . sodium chloride  3 mL Intravenous Q12H    Time spent: >30 minutes    Keithen Capo  [facility] Pager [phone]. If 8PM-8AM,  please contact night-coverage at [email or web address], password [facility] 08/16/2012, 12:59 PM  LOS: 3 days

## 2012-08-16 NOTE — Progress Notes (Signed)
ANTIBIOTIC CONSULT NOTE - INITIAL  Pharmacy Consult for vancomycin Indication: rule out pneumonia  Allergies  Allergen Reactions  . Promethazine Hcl Anxiety and Other (See Comments)    Change in LOC  . Sulfonamide Derivatives Hives    All over the body    Patient Measurements: Height: 5\' 5"  (165.1 cm) Weight: 141 lb (63.957 kg) IBW/kg (Calculated) : 57    Vital Signs:   Intake/Output from previous day: 01/04 0701 - 01/05 0700 In: 200 [IV Piggyback:200] Out: 400 [Urine:400] Intake/Output from this shift:    Labs:  Basename 08/16/12 0442 08/15/12 0420 08/14/12 0435 08/14/12 0005  WBC 7.0 6.1 4.7 --  HGB 10.4* 10.4* 7.5* --  PLT 134* 122* 120* --  LABCREA -- -- -- --  CREATININE -- 1.05 0.97 0.94   Estimated Creatinine Clearance: 51.3 ml/min (by C-G formula based on Cr of 1.05). No results found for this basename: VANCOTROUGH:2,VANCOPEAK:2,VANCORANDOM:2,GENTTROUGH:2,GENTPEAK:2,GENTRANDOM:2,TOBRATROUGH:2,TOBRAPEAK:2,TOBRARND:2,AMIKACINPEAK:2,AMIKACINTROU:2,AMIKACIN:2, in the last 72 hours   Microbiology: No results found for this or any previous visit (from the past 720 hour(s)).  Medical History: Past Medical History  Diagnosis Date  . Hypertension   . Hyperlipidemia   . GERD (gastroesophageal reflux disease)   . Depression   . Esophageal stricture 2003     [place] [facility] esophageal dilation in 2003 performed by Dr. Marina Goodell, second esophageal dilation performed in 2006  by Dr. Marina Goodell  . Obstructive jaundice due to malignant neoplasm  February 2012     status post ERCP done by Dr. Marina Goodell with biliary sphincterotomy, bile duct stricture brushings, biliary stent placement, performed secondary to obstructive jaundice, malignant-appearing bile duct stricture of the common hepatic duct worrisome for primary cholangiocarcinoma, status post ERCP with biliary sphincterotomy and biliary stent placement...status post biopsy -  markedly atypical cells cons  . Rotator cuff syndrome  2010     per MRI March 2010 there is a prominent articular surface partial tear of the supraspinatus which is nearly full thickness, as well as a tear of the distal anterior supraspinatus which probably represents a full-thickness partial tear,  with moderate supraspinatus tendinopathy, moderate infraspinatus tendinopathy and prominent undersurface tear of the acromion along the coracoacromial ligament  . Hiatal hernia   . SOB (shortness of breath) on exertion   . Bile duct adenocarcinoma   . Myocardial infarction   . Angina   . Headache   . Arthritis   . Dysrhythmia   . Peripheral vascular disease   . Anemia   . Diarrhea   . Nausea   . Cholangiocarcinoma   . Complication of anesthesia   . Pneumonia 08/13/2012    Assessment: 62 yo female with possible PNA noted with metastatic cholangiocarcinoma (Last chemotherapy was 3 weeks ago).  Patient has been ordered levaquin and cefepime and also ordered vancomycin per pharmacy.  01/02 >> Vanc >> 01/02 >> Cefepime >>  01/02 >> Levaquin >> 01/02 >> Tamiflu >>  Tmax: afeb WBCs: 7 Renal: SCr with very sl inc-CrCl ~55 (N70)  / sputum: never collected Influenza: + H1N1 Strep pneumo Ag = neg Legionella = pending  Dose changes/drug level info:   Goal of Therapy:  Vancomycin trough level 15-20 mcg/ml  Plan:  - Vancomycin 750mg  IV q12h, cefepime/levofloxacin doses remain appropriate - With + influenza, suggest de-escalate antibiotics - Follow-up with physician's plan for abx to determine need to check vancomycin trough - Continue to follow serologies, renal function and clinical progress  Juliette Alcide, PharmD, BCPS.   Pager: [phone]  08/16/2012 11:17 AM

## 2012-08-17 DIAGNOSIS — E876 Hypokalemia: Secondary | ICD-10-CM

## 2012-08-17 DIAGNOSIS — R05 Cough: Secondary | ICD-10-CM

## 2012-08-17 DIAGNOSIS — J111 Influenza due to unidentified influenza virus with other respiratory manifestations: Secondary | ICD-10-CM

## 2012-08-17 LAB — CBC
HCT: 30.3 % — ABNORMAL LOW (ref 36.0–46.0)
MCHC: 33 g/dL (ref 30.0–36.0)
MCV: 92.1 fL (ref 78.0–100.0)
Platelets: 128 10*3/uL — ABNORMAL LOW (ref 150–400)
RDW: 16.5 % — ABNORMAL HIGH (ref 11.5–15.5)

## 2012-08-17 LAB — BASIC METABOLIC PANEL
BUN: 10 mg/dL (ref 6–23)
Creatinine, Ser: 1.06 mg/dL (ref 0.50–1.10)
GFR calc Af Amer: 65 mL/min — ABNORMAL LOW (ref >90)
GFR calc non Af Amer: 56 mL/min — ABNORMAL LOW (ref >90)
Potassium: 3.3 mEq/L — ABNORMAL LOW (ref 3.5–5.1)

## 2012-08-17 MED ORDER — ALBUTEROL SULFATE (5 MG/ML) 0.5% IN NEBU
2.5000 mg | INHALATION_SOLUTION | Freq: Three times a day (TID) | RESPIRATORY_TRACT | Status: DC
  Administered 2012-08-17 – 2012-08-18 (×5): 2.5 mg via RESPIRATORY_TRACT
  Filled 2012-08-17 (×5): qty 0.5

## 2012-08-17 MED ORDER — POTASSIUM CHLORIDE CRYS ER 20 MEQ PO TBCR
40.0000 meq | EXTENDED_RELEASE_TABLET | ORAL | Status: AC
  Administered 2012-08-17 (×3): 40 meq via ORAL
  Filled 2012-08-17 (×3): qty 2

## 2012-08-17 MED ORDER — GUAIFENESIN ER 600 MG PO TB12
1200.0000 mg | ORAL_TABLET | Freq: Two times a day (BID) | ORAL | Status: DC
  Administered 2012-08-17 – 2012-08-18 (×3): 1200 mg via ORAL
  Filled 2012-08-17 (×4): qty 2

## 2012-08-17 MED ORDER — FUROSEMIDE 10 MG/ML IJ SOLN
20.0000 mg | Freq: Once | INTRAMUSCULAR | Status: AC
  Administered 2012-08-17: 20 mg via INTRAVENOUS
  Filled 2012-08-17: qty 2

## 2012-08-17 MED ORDER — FLUTICASONE PROPIONATE 50 MCG/ACT NA SUSP
2.0000 | Freq: Every day | NASAL | Status: DC
  Administered 2012-08-17 – 2012-08-18 (×2): 2 via NASAL
  Filled 2012-08-17: qty 16

## 2012-08-17 NOTE — Progress Notes (Signed)
[facility] PROGRESS NOTE  [name] WJX:[id number] DOB: 26-Aug-1950 DOA: 08/13/2012 PCP: Genella Mech, MD  Assessment/Plan: 1-SOB/general malaise secondary to PNA and Influenza: PCR positive for H1N1 Influenza A. Also with CXR demonstrating infiltrates concerning for PNA. Patient continued improving and has remained afebrile. Will change to PO levaquin today; will continue Tamiflu. Start mucinex, albuterol and also IS. Patient with decreased BS at bases as well, received aggressive IVF's resuscitation and 2 units of PRBCS during admission. Will give 1 dose of lasix.   2-Metastatic cholangiocarcinoma: per oncology; will follow recommendations. Chemotherapy on hold for now until patient recovers from acute infection  3-Anemia: per oncology recommendation patient has received 2 units of PRBC's; Hgb remains stable at 10.0; had 2 episodes of positive BRPR after finally moving her bowels on 08/16/12. No further blood in her stool. Will monitor.  4-Thrombocytopenia: patient with low platelets probably associated with chemotherapy and infection. Will hold heparin products and follow platelets trend. Platelets level remains stable and no signs of petechiae, bruises or ecchymoses appreciated on exam.  5-GERD: continue PPI  6-anorexia and depression: continue remeron and encourage PO intake  7-severe protein calorie malnutrition: will continue ensure TID. Appreciate nutrition service recommendations and assistance.  8-constipation: Resolved. Will continue bowel regimen.  9-BRPR: patient with HX of internal hemorrhoids and also diverticulosis; no further blood in stool. Will monitor. Hgb 10.0  Hypokalemia: will replete. BMET in am.  DVT: SCD's  Code Status: Full Family Communication: no family at bedside Disposition Plan: will like go home tomorrow. Will improved her breathing a little more.    Consultants:  Oncology  Procedures:  CXR see below for  reports  Antibiotics:  Vanc and cefepime 1/2--1/5  levaquin 1/2  HPI/Subjective: Afebrile. Feeling better overall. Reports cough and wheezing as main complaints now. O2 sat on RA in the 96%; no accessory muscle use.   Objective: Filed Vitals:   08/16/12 1838 08/16/12 2150 08/17/12 0509 08/17/12 0933  BP:  109/72 130/85   Pulse:  87 83   Temp:  98.6 F (37 C) 98.6 F (37 C)   TempSrc:  Oral Oral   Resp:   16   Height:      Weight:      SpO2: 98% 99% 100% 98%    Intake/Output Summary (Last 24 hours) at 08/17/12 1018 Last data filed at 08/16/12 1500  Gross per 24 hour  Intake    200 ml  Output      0 ml  Net    200 ml   Filed Weights   08/13/12 1534 08/13/12 1856  Weight: 64.411 kg (142 lb) 63.957 kg (141 lb)    Exam:   General:  Weak, afebrile and feeling better. No further blood in her stool. Coughing and wheezing.  Cardiovascular: S1 and S2; no rubs  Respiratory: significant ronchus; positive exp wheezing, decrease BS at bases (R>L)  Abdomen: soft, NT, ND, positive BS  Extremities: no edema, no cyanosis  Neuro: no focal deficit  Data Reviewed: Basic Metabolic Panel:  Lab 08/17/12 2130 08/15/12 0420 08/14/12 0435 08/14/12 0005  NA 140 141 139 136  K 3.3* 3.7 3.7 3.3*  CL 109 108 107 104  CO2 23 25 26 24   GLUCOSE 112* 85 103* 111*  BUN 10 12 17 19   CREATININE 1.06 1.05 0.97 0.94  CALCIUM 8.8 9.0 8.9 8.7  MG -- -- -- --  PHOS -- -- -- --   CBC:  Lab 08/17/12 0500 08/16/12 0442 08/15/12 0420  08/14/12 0435 08/14/12 0005  WBC 6.6 7.0 6.1 4.7 4.9  NEUTROABS -- -- -- -- --  HGB 10.0* 10.4* 10.4* 7.5* 7.8*  HCT 30.3* 31.0* 30.3* 22.7* 23.4*  MCV 92.1 91.4 90.7 92.7 92.5  PLT 128* 134* 122* 120* 128*    Studies: No results found.  Scheduled Meds:    . albuterol  2.5 mg Nebulization TID  . docusate sodium  100 mg Oral BID  . feeding supplement  237 mL Oral TID BM  . fluticasone  2 spray Each Nare Daily  . furosemide  20 mg Intravenous  Once  . gabapentin  600 mg Oral TID  . guaiFENesin  1,200 mg Oral BID  . levofloxacin  750 mg Oral Daily  . metoprolol succinate  50 mg Oral Daily  . mirtazapine  15 mg Oral QHS  . oseltamivir  75 mg Oral BID  . pantoprazole  40 mg Oral BID  . polyethylene glycol  17 g Oral BID  . potassium chloride  40 mEq Oral Q4H  . sodium chloride  3 mL Intravenous Q12H    Time spent: >30 minutes    Chirsty Armistead  [facility] Pager [phone]. If 8PM-8AM, please contact night-coverage at [email or web address], password [facility] 08/17/2012, 10:18 AM  LOS: 4 days

## 2012-08-17 NOTE — Progress Notes (Signed)
Ms. Wiens is (+) for the flu!!!  Good call by the Hospitalist!!! She is on Tamiflu. She continues on Levaquin. She is very congested today. I started some Mucinex and also some Flonase nasal spray.  She sounds a lot better than when she was admitted. She sounds stronger. Hopefully the blood transfusion helped. Her potassium is a little low at 3.3. Kidney function was good. Her blood counts are fine.  On physical exam she is afebrile. Blood pressure 130/85. Good oxygen saturation. Lungs show rhonchi bilaterally. She has some wheezes by laterally. She has decent air movement. Cardiac cardiac exam regular rate and rhythm with a normal S1 and S2. Abdominal exam is soft. She has good bowel sounds. There is no fluid wave. Extremities shows no clubbing cyanosis or edema.  Continue supportive therapy for the flu. She may have pneumonia also chest x-ray rate when she was admitted. Overall, she does look in sound a better than when she was admitted. I suspect that she likely will be hospitalized for a few more days.  Hewitt Shorts  Psalm 55:22

## 2012-08-17 NOTE — Evaluation (Signed)
Occupational Therapy Evaluation & Discharge Patient Details Name: [name] MRN: [id number] DOB: 1950/09/30 Today's Date: 08/17/2012 Time: 4098-1191 OT Time Calculation (min): 10 min  OT Assessment / Plan / Recommendation Clinical Impression  62 yo female with hx ca admitted with pna and H1N1. Pt is mod I with all BADLs and will have prn A upon return home.     OT Assessment  Patient does not need any further OT services    Follow Up Recommendations  No OT follow up    Barriers to Discharge      Equipment Recommendations  None recommended by OT    Recommendations for Other Services    Frequency       Precautions / Restrictions Precautions Precautions: Fall   Pertinent Vitals/Pain Pt denied pain    ADL  Grooming: Wash/dry hands;Modified independent Where Assessed - Grooming: Unsupported standing Upper Body Bathing: Modified independent Where Assessed - Upper Body Bathing: Unsupported sitting Lower Body Bathing: Modified independent Where Assessed - Lower Body Bathing: Supported sit to stand Upper Body Dressing: Modified independent Where Assessed - Upper Body Dressing: Unsupported sitting Lower Body Dressing: Modified independent Where Assessed - Lower Body Dressing: Supported sit to Pharmacist, hospital: Modified independent Statistician Method: Sit to Barista: Comfort height toilet Toileting - Architect and Hygiene: Modified independent Where Assessed - Engineer, mining and Hygiene: Sit to stand from 3-in-1 or toilet Transfers/Ambulation Related to ADLs: Pt ambulated around the room pushing IV pole. States she has been pushing RW in the hallways.    OT Diagnosis:    OT Problem List:   OT Treatment Interventions:     OT Goals    Visit Information  Last OT Received On: 08/17/12 Assistance Needed: +1    Subjective Data  Subjective: My husband helps take care of me. Patient Stated Goal: Not asked.     Prior Functioning     Home Living Lives With: Spouse Available Help at Discharge: Family;Available PRN/intermittently Type of Home: House Home Access: Level entry Home Layout: One level Bathroom Shower/Tub: Engineer, manufacturing systems: Standard Home Adaptive Equipment: Shower chair with back;Walker - rolling;Straight cane Prior Function Level of Independence: Needs assistance Needs Assistance: Light Housekeeping;Meal Prep Meal Prep: Total Light Housekeeping: Total Driving: No Comments: used a cane or a RW intermittently Communication Communication: No difficulties Dominant Hand: Right         Vision/Perception     Cognition  Overall Cognitive Status: Appears within functional limits for tasks assessed/performed Arousal/Alertness: Awake/alert Orientation Level: Appears intact for tasks assessed Behavior During Session: [facility] for tasks performed    Extremity/Trunk Assessment Right Upper Extremity Assessment RUE ROM/Strength/Tone: [facility] for tasks assessed Left Upper Extremity Assessment LUE ROM/Strength/Tone: WFL for tasks assessed     Mobility Bed Mobility Supine to Sit: 7: Independent Transfers Sit to Stand: 6: Modified independent (Device/Increase time) Stand to Sit: 6: Modified independent (Device/Increase time)     Shoulder Instructions     Exercise     Balance Static Standing Balance Static Standing - Balance Support: No upper extremity supported;During functional activity Static Standing - Level of Assistance: 6: Modified independent (Device/Increase time)   End of Session OT - End of Session Equipment Utilized During Treatment: Gait belt Activity Tolerance: Patient tolerated treatment well Patient left: in chair;with call bell/phone within reach  GO     Michelle Vanhise A OTR/L [phone] 08/17/2012, 3:12 PM

## 2012-08-18 LAB — CBC
Platelets: 123 10*3/uL — ABNORMAL LOW (ref 150–400)
RBC: 3.2 MIL/uL — ABNORMAL LOW (ref 3.87–5.11)
RDW: 16.6 % — ABNORMAL HIGH (ref 11.5–15.5)
WBC: 7.6 10*3/uL (ref 4.0–10.5)

## 2012-08-18 LAB — BASIC METABOLIC PANEL
Calcium: 9 mg/dL (ref 8.4–10.5)
Creatinine, Ser: 1.04 mg/dL (ref 0.50–1.10)
GFR calc Af Amer: 66 mL/min — ABNORMAL LOW (ref >90)
Sodium: 142 mEq/L (ref 135–145)

## 2012-08-18 MED ORDER — LEVOFLOXACIN 750 MG PO TABS: 750.0000 mg | ORAL_TABLET | Freq: Every day | ORAL | Status: DC

## 2012-08-18 MED ORDER — ENSURE COMPLETE PO LIQD: 237.0000 mL | Freq: Three times a day (TID) | ORAL | Status: DC

## 2012-08-18 MED ORDER — DSS 100 MG PO CAPS: 100.0000 mg | ORAL_CAPSULE | Freq: Two times a day (BID) | ORAL | Status: DC

## 2012-08-18 MED ORDER — POLYETHYLENE GLYCOL 3350 17 G PO PACK: 17.0000 g | PACK | Freq: Two times a day (BID) | ORAL | Status: DC

## 2012-08-18 MED ORDER — GUAIFENESIN ER 600 MG PO TB12: 1200.0000 mg | ORAL_TABLET | Freq: Two times a day (BID) | ORAL | Status: DC

## 2012-08-18 MED ORDER — IPRATROPIUM-ALBUTEROL 20-100 MCG/ACT IN AERS: 1.0000 | INHALATION_SPRAY | Freq: Four times a day (QID) | RESPIRATORY_TRACT | Status: DC | PRN

## 2012-08-18 MED ORDER — FLUTICASONE PROPIONATE 50 MCG/ACT NA SUSP: 2.0000 | Freq: Every day | NASAL | Status: DC

## 2012-08-18 MED ORDER — HEPARIN SOD (PORK) LOCK FLUSH 100 UNIT/ML IV SOLN
500.0000 [IU] | Freq: Once | INTRAVENOUS | Status: AC
  Administered 2012-08-18: 500 [IU] via INTRAVENOUS

## 2012-08-18 NOTE — Discharge Summary (Signed)
Physician Discharge Summary  [name]:[id number] DOB: 02/20/1951 DOA: 08/13/2012  PCP: Genella Mech, MD  Admit date: 08/13/2012 Discharge date: 08/18/2012  Time spent: >30 minutes  Recommendations for Outpatient Follow-up:  1-CBC to follow Hgb at follow up 2-Bmet to follow electrolytes 3-Follow resolution of symptoms.  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 Patient will be discharged home. Follow up with PCP and also with oncologist as an outpatient.  Diet recommendation: heart healthy  Filed Weights   08/13/12 1534 08/13/12 1856  Weight: 64.411 kg (142 lb) 63.957 kg (141 lb)    History of present illness:  62 Y/o female with hx of cholangiocarcinoma, HTN, depression, anemia and GERD; came to ED complaining of general malaise, cough, fever and weakness. Illness began 12/26 with cough and shortness of breath. She was started on cefdinir 12/27 fail to improve. She developed progressive worsening of cough, shortness of breath and continued low-grade fever. She also complained of myalgias. She has not had a flu vaccination this year.  Patient was seen [facility] by Dr. Myna Hidalgo for chemotherapy today and referred to the emergency department secondary to pneumonia. Office note was dictated, not available for review at this time. Transfer accepted by my partner Dr. Gonzella Lex.   Hospital Course:  1-SOB/general malaise secondary to PNA and Influenza: PCR positive for H1N1 Influenza A. Also with CXR demonstrating infiltrates concerning for PNA. Patient continued improving and has remained afebrile. Patient discharge on PO levaquin to finish tx for PNA (5 more days). Tamiflu completed while inpatient. Started on mucinex, albuterol, and also IS as coadjuvant therapy  for her URI/symptoms.Marland Kitchen   2-Metastatic cholangiocarcinoma: per oncology; will follow recommendations. Chemotherapy on hold for now until patient recovers from acute infection   3-Anemia: per oncology recommendation patient has received 2 units of PRBC's; Hgb remains stable at 9.8-10.0; had 2 small episodes of positive BRPR after finally moving her bowels on 08/16/12. No further blood in her stool since.   4-Thrombocytopenia: Patient with low platelets probably associated with chemotherapy and infection. Heparin products not given during admission and at discharge platelets level stable at 123 K range. No signs of petechiae, bruises or ecchymoses appreciated on exam.   5-GERD: continue PPI   6-anorexia and depression: continue remeron and encourage PO intake   7-severe protein calorie malnutrition: will continue ensure TID. Appreciate nutrition service recommendations and assistance.   8-constipation: Resolved. Will continue bowel regimen with colace and miralax; patient advised to keep herself hydrated.   9-BRPR: patient with HX of internal hemorrhoids and also diverticulosis; no further blood in stool. At discharge Hgb 9.8   10-Hypokalemia: will replete. BMET in am.  11-Rhinitis: started on Flonase.   Consultations:  Oncology  PT/OT  Discharge Exam: Filed Vitals:   08/18/12 0542 08/18/12 0720 08/18/12 1329 08/18/12 1412  BP: 117/76  117/79   Pulse: 84  89   Temp: 99.1 F (37.3 C)  99 F (37.2 C)   TempSrc: Oral  Oral   Resp: 18  18   Height:      Weight:      SpO2: 99% 97% 100% 97%    General: afebrile, feeling a lot better; still with mild cough but good o2 sat on RA and feeling stronger.  Cardiovascular: S1 and S2; no rubs  Respiratory: slight rhonchi on exam, mild wheezing; good air movement, no  crackles  Abdomen: soft, NT, ND, positive BS  Extremities: no edema, no cyanosis  Neuro: no focal deficit  Discharge Instructions  Discharge Orders    Future  Appointments: Provider: Department: Dept Phone: Center:   08/24/2012 12:00 PM Rachael Fee [facility] [phone] None   08/24/2012 12:15 PM Eunice Blase, PA [place] [facility] [phone] None   08/24/2012 1:15 PM [facility]p Chair 8 [place] [facility] [phone] None   08/26/2012 1:30 PM [facility] Lab 2 [facility] [phone] [facility]   08/26/2012 2:20 PM Evern Bio, NP [facility] [phone] [facility]   08/31/2012 10:15 AM Sharlotte Alamo [facility] [phone] None   08/31/2012 10:45 AM [facility]p Chair 4 [place] [facility] [phone] None   09/14/2012 11:45 AM Sharlotte Alamo [facility] [phone] None   09/14/2012 12:15 PM Josph Macho, MD [facility] [phone] None   09/14/2012 12:45 PM [facility]p Chair 7  [facility] [phone] None     Future Orders Please Complete By Expires   Diet - low sodium heart healthy      Discharge instructions      Comments:   -Keep yourself well hydrated -Take medications as prescribed -arrange follow up with PCP and with oncologist as an outpatient; 1-2 weeks after discharge.       Medication List     As of 08/18/2012  3:18 PM    STOP taking these medications         cefdinir 300 MG capsule   Commonly known as: OMNICEF      TAKE these medications         ALPHA LIPOIC ACID PO   Take 1 capsule by mouth every morning.      chlorpheniramine-HYDROcodone 10-8 MG/5ML Lqcr   Commonly known as: TUSSIONEX   Take 5 mLs by mouth every 12 (twelve) hours as needed.      DSS 100 MG Caps   Take 100 mg by mouth 2 (two) times daily.      feeding supplement Liqd   Take 237 mLs by mouth 3 (three) times daily between meals.      fluticasone 50 MCG/ACT nasal spray   Commonly known as: FLONASE     Place 2 sprays into the nose daily.      gabapentin 300 MG capsule   Commonly known as: NEURONTIN   Take 2 capsules (600 mg total) by mouth 3 (three) times daily.      guaiFENesin 600 MG 12 hr tablet   Commonly known as: MUCINEX   Take 2 tablets (1,200 mg total) by mouth 2 (two) times daily.      Ipratropium-Albuterol 20-100 MCG/ACT Aers respimat   Commonly known as: COMBIVENT   Inhale 1 puff into the lungs every 6 (six) hours as needed for wheezing or shortness of breath.      levofloxacin 750 MG tablet   Commonly known as: LEVAQUIN   Take 1 tablet (750 mg total) by mouth daily.      lidocaine-prilocaine cream   Commonly known as: EMLA   APPLY TO THE AFFECTED AREA AS DIRECTED      LORazepam 0.5 MG tablet   Commonly known as: ATIVAN   Take 1 tablet (0.5 mg total) by mouth every 8 (eight)  hours. For nausea      metoCLOPramide 10 MG tablet   Commonly known as: REGLAN   Take 10 mg by mouth every 6 (six) hours as needed. For nausea and vomiting      metoprolol succinate 50 MG 24 hr tablet   Commonly known as: TOPROL-XL   Take 1 tablet (50 mg total) by mouth daily.      mirtazapine 15 MG tablet   Commonly known as: REMERON   Take 1 tablet (15 mg total) by mouth at bedtime.      Omeprazole 20 MG Tbec   Take 1 tablet (20 mg total) by mouth 2 (two) times daily.      oxycodone 5 MG capsule   Commonly known as: OXY-IR   Take 1 capsule (5 mg total) by mouth every 6 (six) hours as needed. For pain      polyethylene glycol packet   Commonly known as: MIRALAX / GLYCOLAX   Take 17 g by mouth 2 (two) times daily.      sucralfate 1 GM/10ML suspension   Commonly known as: CARAFATE   Take 1 g by mouth every 6 (six) hours.      Vitamin-B Complex Tabs   Take by mouth every morning.           Follow-up Information    Follow up with Genella Mech, MD. Schedule an appointment as soon as possible for a visit in 2 weeks.   Contact information:   [address] [place] [address] [phone]       Call Josph Macho, MD. (to set up follow up appointment)    Contact information:   [address] [place] [address] [phone]           The results of significant diagnostics from this hospitalization (including imaging, microbiology, ancillary and laboratory) are listed below for reference.    Significant Diagnostic Studies: Dg Chest 2 View  08/13/2012  *RADIOLOGY REPORT*  Clinical Data: Shortness of breath and cough.  Fever and chest pain.  Metastatic gallbladder cancer.  CHEST - 2 VIEW  Comparison: Acute abdomen series of 06/23/2012.  Findings: Right-sided Port-A-Cath which terminates at the mid to low SVC. Normal heart size and mediastinal contours for age.  A small right pleural effusion is new since 06/03/2012 CT. No pneumothorax.  Scattered nodular opacities likely correspond pulmonary metastasis on prior CT.  Suspicion of right base air space disease.  IMPRESSION: Development of a small right pleural effusion since 06/03/12. Adjacent subtle airspace disease which could represent atelectasis or early infection.  Consider short-term radiographic follow-up.  Mild pulmonary nodularity which likely corresponds to a metastasis on the prior CT.   Original Report Authenticated By: Jeronimo Greaves, M.D.     Labs: Basic Metabolic Panel:  Lab 08/18/12 6962 08/17/12 0500 08/15/12 0420 08/14/12 0435 08/14/12 0005  NA 142 140 141 139 136  K 4.8 3.3* 3.7 3.7 3.3*  CL 112 109 108 107 104  CO2 24 23 25 26 24   GLUCOSE 91 112* 85 103* 111*  BUN 11 10 12 17 19   CREATININE 1.04 1.06 1.05 0.97 0.94  CALCIUM 9.0 8.8 9.0 8.9 8.7  MG -- -- -- -- --  PHOS -- -- -- -- --   Liver Function Tests:  Lab 08/15/12 0420  AST 21  ALT 12  ALKPHOS 76  BILITOT 0.6  PROT 6.0  ALBUMIN 2.1*   CBC:  Lab 08/18/12 0500 08/17/12 0500  08/16/12 0442 08/15/12 0420 08/14/12 0435  WBC 7.6 6.6 7.0 6.1 4.7  NEUTROABS -- -- -- -- --  HGB 9.8*  10.0* 10.4* 10.4* 7.5*  HCT 29.7* 30.3* 31.0* 30.3* 22.7*  MCV 92.8 92.1 91.4 90.7 92.7  PLT 123* 128* 134* 122* 120*   CBG:  Lab 08/15/12 0740  GLUCAP 88     Signed:  Metta Koranda  [facility] 08/18/2012, 3:18 PM

## 2012-08-19 LAB — LEGIONELLA ANTIGEN, URINE: Legionella Antigen, Urine: NEGATIVE

## 2012-08-20 ENCOUNTER — Other Ambulatory Visit: Payer: Self-pay | Admitting: *Deleted

## 2012-08-20 ENCOUNTER — Ambulatory Visit: Payer: Self-pay

## 2012-08-20 ENCOUNTER — Other Ambulatory Visit: Payer: Self-pay | Admitting: Lab

## 2012-08-20 DIAGNOSIS — C221 Intrahepatic bile duct carcinoma: Secondary | ICD-10-CM

## 2012-08-20 MED ORDER — LORAZEPAM 0.5 MG PO TABS: 0.5000 mg | ORAL_TABLET | Freq: Four times a day (QID) | ORAL | Status: DC | PRN

## 2012-08-20 NOTE — Telephone Encounter (Signed)
Pt called requesting a refill for Lorazepam 0.5 mg. She is having trouble keeping food down if she doesn't take it. If she does take one in the morning, it will last her all day. Called rx to [facility] as requested.

## 2012-08-24 ENCOUNTER — Other Ambulatory Visit (HOSPITAL_BASED_OUTPATIENT_CLINIC_OR_DEPARTMENT_OTHER): Payer: Medicare Other | Admitting: Lab

## 2012-08-24 ENCOUNTER — Ambulatory Visit (HOSPITAL_BASED_OUTPATIENT_CLINIC_OR_DEPARTMENT_OTHER): Payer: Medicare Other

## 2012-08-24 ENCOUNTER — Ambulatory Visit (HOSPITAL_BASED_OUTPATIENT_CLINIC_OR_DEPARTMENT_OTHER): Payer: Medicare Other | Admitting: Medical

## 2012-08-24 VITALS — BP 126/85 | HR 76 | Temp 97.9°F | Resp 16 | Ht 65.0 in | Wt 143.0 lb

## 2012-08-24 DIAGNOSIS — D696 Thrombocytopenia, unspecified: Secondary | ICD-10-CM

## 2012-08-24 DIAGNOSIS — Z5111 Encounter for antineoplastic chemotherapy: Secondary | ICD-10-CM

## 2012-08-24 DIAGNOSIS — C221 Intrahepatic bile duct carcinoma: Secondary | ICD-10-CM

## 2012-08-24 DIAGNOSIS — D649 Anemia, unspecified: Secondary | ICD-10-CM

## 2012-08-24 DIAGNOSIS — C7800 Secondary malignant neoplasm of unspecified lung: Secondary | ICD-10-CM

## 2012-08-24 DIAGNOSIS — R05 Cough: Secondary | ICD-10-CM

## 2012-08-24 LAB — CBC WITH DIFFERENTIAL (CANCER CENTER ONLY)
BASO#: 0 10*3/uL (ref 0.0–0.2)
BASO%: 0 % (ref 0.0–2.0)
EOS%: 0.7 % (ref 0.0–7.0)
LYMPH#: 0.8 10*3/uL — ABNORMAL LOW (ref 0.9–3.3)
MCH: 30.9 pg (ref 26.0–34.0)
MCHC: 32.7 g/dL (ref 32.0–36.0)
MONO%: 12.9 % (ref 0.0–13.0)
NEUT#: 4.5 10*3/uL (ref 1.5–6.5)
NEUT%: 73.5 % (ref 39.6–80.0)
RDW: 15.9 % — ABNORMAL HIGH (ref 11.1–15.7)

## 2012-08-24 LAB — COMPREHENSIVE METABOLIC PANEL
ALT: 9 U/L (ref 0–35)
AST: 20 U/L (ref 0–37)
Albumin: 3.1 g/dL — ABNORMAL LOW (ref 3.5–5.2)
Alkaline Phosphatase: 82 U/L (ref 39–117)
Calcium: 8.9 mg/dL (ref 8.4–10.5)
Chloride: 107 mEq/L (ref 96–112)
Potassium: 4 mEq/L (ref 3.5–5.3)
Sodium: 139 mEq/L (ref 135–145)

## 2012-08-24 MED ORDER — DEXAMETHASONE SODIUM PHOSPHATE 10 MG/ML IJ SOLN
10.0000 mg | Freq: Once | INTRAMUSCULAR | Status: AC
  Administered 2012-08-24: 10 mg via INTRAVENOUS

## 2012-08-24 MED ORDER — PACLITAXEL PROTEIN-BOUND CHEMO INJECTION 100 MG
90.0000 mg/m2 | Freq: Once | INTRAVENOUS | Status: AC
  Administered 2012-08-24: 150 mg via INTRAVENOUS
  Filled 2012-08-24: qty 30

## 2012-08-24 MED ORDER — HEPARIN SOD (PORK) LOCK FLUSH 100 UNIT/ML IV SOLN
500.0000 [IU] | Freq: Once | INTRAVENOUS | Status: AC | PRN
  Administered 2012-08-24: 500 [IU]
  Filled 2012-08-24: qty 5

## 2012-08-24 MED ORDER — SODIUM CHLORIDE 0.9 % IJ SOLN
10.0000 mL | INTRAMUSCULAR | Status: DC | PRN
  Administered 2012-08-24: 10 mL
  Filled 2012-08-24: qty 10

## 2012-08-24 MED ORDER — SODIUM CHLORIDE 0.9 % IJ SOLN
3.0000 mL | INTRAMUSCULAR | Status: DC | PRN
  Filled 2012-08-24: qty 10

## 2012-08-24 MED ORDER — ALTEPLASE 2 MG IJ SOLR
2.0000 mg | Freq: Once | INTRAMUSCULAR | Status: DC | PRN
  Filled 2012-08-24: qty 2

## 2012-08-24 MED ORDER — ONDANSETRON 8 MG/50ML IVPB (CHCC)
8.0000 mg | Freq: Once | INTRAVENOUS | Status: AC
  Administered 2012-08-24: 8 mg via INTRAVENOUS

## 2012-08-24 MED ORDER — HEPARIN SOD (PORK) LOCK FLUSH 100 UNIT/ML IV SOLN
250.0000 [IU] | Freq: Once | INTRAVENOUS | Status: DC | PRN
  Filled 2012-08-24: qty 5

## 2012-08-24 MED ORDER — SODIUM CHLORIDE 0.9 % IV SOLN: Freq: Once | INTRAVENOUS | Status: DC

## 2012-08-24 NOTE — Patient Instructions (Addendum)
Paclitaxel injection What is this medicine? PACLITAXEL (PAK li TAX el) is a chemotherapy drug. It targets fast dividing cells, like cancer cells, and causes these cells to die. This medicine is used to treat ovarian cancer, breast cancer, and other cancers. This medicine may be used for other purposes; ask your health care provider or pharmacist if you have questions. What should I tell my health care provider before I take this medicine? They need to know if you have any of these conditions: -blood disorders -irregular heartbeat -infection (especially a virus infection such as chickenpox, cold sores, or herpes) -liver disease -previous or ongoing radiation therapy -an unusual or allergic reaction to paclitaxel, alcohol, polyoxyethylated castor oil, other chemotherapy agents, other medicines, foods, dyes, or preservatives -pregnant or trying to get pregnant -breast-feeding How should I use this medicine? This drug is given as an infusion into a vein. It is administered in a hospital or clinic by a specially trained health care professional. Talk to your pediatrician regarding the use of this medicine in children. Special care may be needed. Overdosage: If you think you have taken too much of this medicine contact a poison control center or emergency room at once. NOTE: This medicine is only for you. Do not share this medicine with others. What if I miss a dose? It is important not to miss your dose. Call your doctor or health care professional if you are unable to keep an appointment. What may interact with this medicine? Do not take this medicine with any of the following medications: -disulfiram -metronidazole This medicine may also interact with the following medications: -cyclosporine -dexamethasone -diazepam -ketoconazole -medicines to increase blood counts like filgrastim, pegfilgrastim, sargramostim -other chemotherapy drugs like cisplatin, doxorubicin, epirubicin, etoposide,  teniposide, vincristine -quinidine -testosterone -vaccines -verapamil Talk to your doctor or health care professional before taking any of these medicines: -acetaminophen -aspirin -ibuprofen -ketoprofen -naproxen This list may not describe all possible interactions. Give your health care provider a list of all the medicines, herbs, non-prescription drugs, or dietary supplements you use. Also tell them if you smoke, drink alcohol, or use illegal drugs. Some items may interact with your medicine. What should I watch for while using this medicine? Your condition will be monitored carefully while you are receiving this medicine. You will need important blood work done while you are taking this medicine. This drug may make you feel generally unwell. This is not uncommon, as chemotherapy can affect healthy cells as well as cancer cells. Report any side effects. Continue your course of treatment even though you feel ill unless your doctor tells you to stop. In some cases, you may be given additional medicines to help with side effects. Follow all directions for their use. Call your doctor or health care professional for advice if you get a fever, chills or sore throat, or other symptoms of a cold or flu. Do not treat yourself. This drug decreases your body's ability to fight infections. Try to avoid being around people who are sick. This medicine may increase your risk to bruise or bleed. Call your doctor or health care professional if you notice any unusual bleeding. Be careful brushing and flossing your teeth or using a toothpick because you may get an infection or bleed more easily. If you have any dental work done, tell your dentist you are receiving this medicine. Avoid taking products that contain aspirin, acetaminophen, ibuprofen, naproxen, or ketoprofen unless instructed by your doctor. These medicines may hide a fever. Do not become pregnant   while taking this medicine. Women should inform their  doctor if they wish to become pregnant or think they might be pregnant. There is a potential for serious side effects to an unborn child. Talk to your health care professional or pharmacist for more information. Do not breast-feed an infant while taking this medicine. Men are advised not to father a child while receiving this medicine. What side effects may I notice from receiving this medicine? Side effects that you should report to your doctor or health care professional as soon as possible: -allergic reactions like skin rash, itching or hives, swelling of the face, lips, or tongue -low blood counts - This drug may decrease the number of white blood cells, red blood cells and platelets. You may be at increased risk for infections and bleeding. -signs of infection - fever or chills, cough, sore throat, pain or difficulty passing urine -signs of decreased platelets or bleeding - bruising, pinpoint red spots on the skin, black, tarry stools, nosebleeds -signs of decreased red blood cells - unusually weak or tired, fainting spells, lightheadedness -breathing problems -chest pain -high or low blood pressure -mouth sores -nausea and vomiting -pain, swelling, redness or irritation at the injection site -pain, tingling, numbness in the hands or feet -slow or irregular heartbeat -swelling of the ankle, feet, hands Side effects that usually do not require medical attention (report to your doctor or health care professional if they continue or are bothersome): -bone pain -complete hair loss including hair on your head, underarms, pubic hair, eyebrows, and eyelashes -changes in the color of fingernails -diarrhea -loosening of the fingernails -loss of appetite -muscle or joint pain -red flush to skin -sweating This list may not describe all possible side effects. Call your doctor for medical advice about side effects. You may report side effects to FDA at 1-800-FDA-1088. Where should I keep my  medicine? This drug is given in a hospital or clinic and will not be stored at home. NOTE: This sheet is a summary. It may not cover all possible information. If you have questions about this medicine, talk to your doctor, pharmacist, or health care provider.  2012, [employer]. (07/11/2008 11:54:26 AM) 

## 2012-08-24 NOTE — Progress Notes (Signed)
Diagnosis: #1.  Metastatic cholangiocarcinoma.  Past therapy: Avastin/Tarceva.  Current therapy: Weekly Abraxane (2 weeks on/one-week off) S/P 2 cycles  Interim history: [name] presents today for an office followup visit.  She was just recently discharged from the hospital secondary to pneumonia, and influenza.  She was discharged on Levaquin by mouth.  She did complete Tamiflu while inpatient.  She reports, that she's feeling much better.  She reports, that she is eating better.  She has more energy.  She states, that she is ready for her.  Treatment today.  She's not reporting any cough, chest pain, or shortness of, breath.  She's not reporting, fevers, chills, or night sweats.  She is not reporting headaches, visual changes, or rashes.  She denies any nausea, vomiting, diarrhea, or constipation.  She denies any obvious, or abnormal bleeding.  Today.  Will be the beginning of her third cycle.  The overall plan, is for her to complete a total of 3 cycles and then repeat scans on her.  We will go ahead and try and get her scans set up for the end of January.  Review of Systems: Constitutional:Negative for malaise/fatigue, fever, chills, weight loss, diaphoresis, activity change, appetite change, and unexpected weight change.  HEENT: Negative for double vision, blurred vision, visual loss, ear pain, tinnitus, congestion, rhinorrhea, epistaxis sore throat or sinus disease, oral pain/lesion, tongue soreness Respiratory: Negative for cough, chest tightness, shortness of breath, wheezing and stridor.  Cardiovascular: Negative for chest pain, palpitations, leg swelling, orthopnea, PND, DOE or claudication Gastrointestinal: Negative for nausea, vomiting, abdominal pain, diarrhea, constipation, blood in stool, melena, hematochezia, abdominal distention, anal bleeding, rectal pain, anorexia and hematemesis.  Genitourinary: Negative for dysuria, frequency, hematuria,  Musculoskeletal: Negative for myalgias,  back pain, joint swelling, arthralgias and gait problem.  Skin: Negative for rash, color change, pallor and wound.  Neurological:. Negative for dizziness/light-headedness, tremors, seizures, syncope, facial asymmetry, speech difficulty, weakness, numbness, headaches and paresthesias.  Hematological: Negative for adenopathy. Does not bruise/bleed easily.  Psychiatric/Behavioral:  Negative for depression, no loss of interest in normal activity or change in sleep pattern.   Physical Exam: This is a 62 year old [place] American female, in no obvious distress Vitals: Temperature 97.9 degrees, pulse 76, respirations 16, blood pressure 126/85.  Weight 143 pounds HEENT reveals a normocephalic, atraumatic skull, no scleral icterus, no oral lesions  Neck is supple without any cervical or supraclavicular adenopathy.  Lungs are clear to auscultation bilaterally. There are no wheezes, rales or rhonci Cardiac is regular rate and rhythm with a normal S1 and S2. There are no murmurs, rubs, or bruits.  Abdomen is soft with good bowel sounds, there is no palpable mass. There is no palpable hepatosplenomegaly. There is no palpable fluid wave.  Musculoskeletal no tenderness of the spine, ribs, or hips.  Extremities there are no clubbing, cyanosis, or edema.  Skin no petechia, purpura or ecchymosis Neurologic is nonfocal.  Laboratory Data: White count 6.1, hemoglobin 10.9, hematocrit 33.3, platelets 95,000  Current Outpatient Prescriptions on File Prior to Visit  Medication Sig Dispense Refill  . ALPHA LIPOIC ACID PO Take 1 capsule by mouth every morning.       . B Complex Vitamins (VITAMIN-B COMPLEX) TABS Take by mouth every morning.      . chlorpheniramine-HYDROcodone (TUSSIONEX) 10-8 MG/5ML LQCR Take 5 mLs by mouth every 12 (twelve) hours as needed.  240 mL  0  . docusate sodium 100 MG CAPS Take 100 mg by mouth 2 (two) times daily.  60 capsule  1  . feeding supplement (ENSURE COMPLETE) LIQD Take 237 mLs by  mouth 3 (three) times daily between meals.      . fluticasone (FLONASE) 50 MCG/ACT nasal spray Place 2 sprays into the nose daily.  16 g  0  . gabapentin (NEURONTIN) 300 MG capsule Take 2 capsules (600 mg total) by mouth 3 (three) times daily.  180 capsule  3  . guaiFENesin (MUCINEX) 600 MG 12 hr tablet Take 2 tablets (1,200 mg total) by mouth 2 (two) times daily.  60 tablet  0  . Ipratropium-Albuterol (COMBIVENT) 20-100 MCG/ACT AERS respimat Inhale 1 puff into the lungs every 6 (six) hours as needed for wheezing or shortness of breath.  1 Inhaler  0  . levofloxacin (LEVAQUIN) 750 MG tablet Take 1 tablet (750 mg total) by mouth daily.  5 tablet  0  . lidocaine-prilocaine (EMLA) cream APPLY TO THE AFFECTED AREA AS DIRECTED  30 g  4  . LORazepam (ATIVAN) 0.5 MG tablet Take 1 tablet (0.5 mg total) by mouth every 6 (six) hours as needed (nausea and vomiting.).  60 tablet  3  . LORazepam (ATIVAN) 0.5 MG tablet Take 1 tablet (0.5 mg total) by mouth every 6 (six) hours as needed for anxiety. For nausea  60 tablet  1  . metoCLOPramide (REGLAN) 10 MG tablet Take 10 mg by mouth every 6 (six) hours as needed. For nausea and vomiting      . metoprolol succinate (TOPROL-XL) 50 MG 24 hr tablet Take 1 tablet (50 mg total) by mouth daily.  90 tablet  3  . mirtazapine (REMERON) 15 MG tablet Take 1 tablet (15 mg total) by mouth at bedtime.  30 tablet  6  . Omeprazole 20 MG TBEC Take 1 tablet (20 mg total) by mouth 2 (two) times daily.  180 each  3  . oxycodone (OXY-IR) 5 MG capsule Take 1 capsule (5 mg total) by mouth every 6 (six) hours as needed. For pain  30 capsule  0  . polyethylene glycol (MIRALAX / GLYCOLAX) packet Take 17 g by mouth 2 (two) times daily.  24 each  1  . sucralfate (CARAFATE) 1 GM/10ML suspension Take 1 g by mouth every 6 (six) hours.       Assessment/Plan: This is a pleasant, 62 year old, African American female, with the following issues:  #1.  Metastatic cholangiocarcinoma.  She is status  post 2 cycles of Abraxane.  So far, she has tolerated her treatment relatively well.  We will continue to monitor her closely.  She will go ahead with the start of the third cycle today.  The overall plan, is for [name] to complete a total of 3 cycles of treatment and repeat scans to see if there's been a response.  We will go ahead and set.  This up for the end of January.  #2.  Anemia/Thrombocytopenia.  Most likely secondary to chemotherapy.  She is asymptomatic.  She will go ahead and receive treatment today.  We will still continue to monitor her counts closely.  #3.   Recent hospitalization for pneumonia/influenza.  She is significantly better.  She did complete a course of Levaquin by mouth.  #4.  Protein calorie malnutrition.  She reports, that she is eating better.  She will continue on Ensure 3 times a day.  #5.  Followup.  [name] will follow back up with [place] on 09/15/2011, but before then should there be questions or concerns.

## 2012-08-25 ENCOUNTER — Telehealth: Payer: Self-pay | Admitting: Hematology & Oncology

## 2012-08-25 NOTE — Telephone Encounter (Signed)
Pt aware of 1-31 CT and to drink at 8 and 9 am and to be NPO 4 hours. She moved 1-20 tx to 1-23

## 2012-08-26 ENCOUNTER — Ambulatory Visit: Payer: Self-pay | Admitting: Neurosurgery

## 2012-08-31 ENCOUNTER — Ambulatory Visit: Payer: Medicare Other

## 2012-08-31 ENCOUNTER — Other Ambulatory Visit: Payer: Medicare Other | Admitting: Lab

## 2012-09-02 ENCOUNTER — Encounter: Payer: Self-pay | Admitting: *Deleted

## 2012-09-03 ENCOUNTER — Ambulatory Visit: Payer: Medicare Other

## 2012-09-03 ENCOUNTER — Ambulatory Visit (HOSPITAL_BASED_OUTPATIENT_CLINIC_OR_DEPARTMENT_OTHER): Payer: Medicare Other | Admitting: Hematology & Oncology

## 2012-09-03 ENCOUNTER — Other Ambulatory Visit: Payer: Self-pay | Admitting: Medical

## 2012-09-03 ENCOUNTER — Encounter (HOSPITAL_COMMUNITY)
Admission: RE | Admit: 2012-09-03 | Discharge: 2012-09-03 | Disposition: A | Discharge status: Home / Self Care | Payer: Medicare Other | Source: Ambulatory Visit | Attending: Hematology & Oncology | Admitting: Hematology & Oncology

## 2012-09-03 ENCOUNTER — Ambulatory Visit (HOSPITAL_BASED_OUTPATIENT_CLINIC_OR_DEPARTMENT_OTHER): Payer: Medicare Other

## 2012-09-03 ENCOUNTER — Encounter: Payer: Self-pay | Admitting: Neurosurgery

## 2012-09-03 ENCOUNTER — Ambulatory Visit (HOSPITAL_BASED_OUTPATIENT_CLINIC_OR_DEPARTMENT_OTHER): Payer: Medicare Other | Admitting: Lab

## 2012-09-03 ENCOUNTER — Other Ambulatory Visit: Payer: Self-pay | Admitting: *Deleted

## 2012-09-03 ENCOUNTER — Other Ambulatory Visit: Payer: Medicare Other | Admitting: Lab

## 2012-09-03 VITALS — BP 105/73 | Temp 97.8°F | Resp 20

## 2012-09-03 VITALS — BP 111/75 | HR 71 | Temp 98.1°F | Resp 20

## 2012-09-03 DIAGNOSIS — C7800 Secondary malignant neoplasm of unspecified lung: Secondary | ICD-10-CM

## 2012-09-03 DIAGNOSIS — C221 Intrahepatic bile duct carcinoma: Secondary | ICD-10-CM

## 2012-09-03 DIAGNOSIS — D649 Anemia, unspecified: Secondary | ICD-10-CM

## 2012-09-03 DIAGNOSIS — Z48812 Encounter for surgical aftercare following surgery on the circulatory system: Secondary | ICD-10-CM

## 2012-09-03 DIAGNOSIS — E876 Hypokalemia: Secondary | ICD-10-CM

## 2012-09-03 DIAGNOSIS — I739 Peripheral vascular disease, unspecified: Secondary | ICD-10-CM

## 2012-09-03 LAB — CBC WITH DIFFERENTIAL (CANCER CENTER ONLY)
BASO%: 0.2 % (ref 0.0–2.0)
Eosinophils Absolute: 0 10*3/uL (ref 0.0–0.5)
LYMPH%: 12.8 % — ABNORMAL LOW (ref 14.0–48.0)
MCH: 31 pg (ref 26.0–34.0)
MCV: 94 fL (ref 81–101)
MONO%: 17.8 % — ABNORMAL HIGH (ref 0.0–13.0)
NEUT#: 3.3 10*3/uL (ref 1.5–6.5)
Platelets: 144 10*3/uL — ABNORMAL LOW (ref 145–400)
RBC: 2.71 10*6/uL — ABNORMAL LOW (ref 3.70–5.32)
RDW: 15.7 % (ref 11.1–15.7)
WBC: 4.8 10*3/uL (ref 3.9–10.0)

## 2012-09-03 LAB — CMP (CANCER CENTER ONLY)
ALT(SGPT): 8 U/L — ABNORMAL LOW (ref 10–47)
AST: 20 U/L (ref 11–38)
Albumin: 2.4 g/dL — ABNORMAL LOW (ref 3.3–5.5)
Alkaline Phosphatase: 90 U/L — ABNORMAL HIGH (ref 26–84)
Potassium: 2.9 mEq/L — ABNORMAL LOW (ref 3.3–4.7)
Sodium: 135 mEq/L (ref 128–145)
Total Bilirubin: 0.9 mg/dl (ref 0.20–1.60)
Total Protein: 6.3 g/dL — ABNORMAL LOW (ref 6.4–8.1)

## 2012-09-03 LAB — PREPARE RBC (CROSSMATCH)

## 2012-09-03 MED ORDER — SODIUM CHLORIDE 0.9 % IV SOLN
INTRAVENOUS | Status: DC
  Administered 2012-09-03: 13:00:00 via INTRAVENOUS
  Filled 2012-09-03: qty 250

## 2012-09-03 MED ORDER — SODIUM CHLORIDE 0.9 % IV SOLN
Freq: Once | INTRAVENOUS | Status: AC
  Administered 2012-09-03: 11:00:00 via INTRAVENOUS

## 2012-09-03 MED ORDER — SODIUM CHLORIDE 0.9 % IV SOLN: Freq: Once | INTRAVENOUS | Status: DC

## 2012-09-03 MED ORDER — SODIUM CHLORIDE 0.9 % IJ SOLN
10.0000 mL | INTRAMUSCULAR | Status: DC | PRN
  Administered 2012-09-03: 10 mL via INTRAVENOUS
  Filled 2012-09-03: qty 10

## 2012-09-03 MED ORDER — SODIUM CHLORIDE 0.9 % IV SOLN
INTRAVENOUS | Status: DC
  Administered 2012-09-03 (×2): via INTRAVENOUS

## 2012-09-03 MED ORDER — SODIUM CHLORIDE 0.9 % IJ SOLN
10.0000 mL | INTRAMUSCULAR | Status: DC | PRN
  Administered 2012-09-03: 10 mL
  Filled 2012-09-03: qty 10

## 2012-09-03 MED ORDER — HEPARIN SOD (PORK) LOCK FLUSH 100 UNIT/ML IV SOLN
500.0000 [IU] | Freq: Once | INTRAVENOUS | Status: AC | PRN
  Administered 2012-09-03: 500 [IU]
  Filled 2012-09-03: qty 5

## 2012-09-03 MED ORDER — DIPHENHYDRAMINE HCL 25 MG PO CAPS
25.0000 mg | ORAL_CAPSULE | Freq: Once | ORAL | Status: AC
  Administered 2012-09-03: 25 mg via ORAL

## 2012-09-03 MED ORDER — ACETAMINOPHEN 325 MG PO TABS
650.0000 mg | ORAL_TABLET | Freq: Once | ORAL | Status: AC
  Administered 2012-09-03: 650 mg via ORAL

## 2012-09-03 MED ORDER — HEPARIN SOD (PORK) LOCK FLUSH 100 UNIT/ML IV SOLN
500.0000 [IU] | Freq: Once | INTRAVENOUS | Status: AC
  Administered 2012-09-03: 500 [IU] via INTRAVENOUS
  Filled 2012-09-03: qty 5

## 2012-09-03 NOTE — Patient Instructions (Signed)

## 2012-09-03 NOTE — Progress Notes (Signed)
5:05 PM Blood infused without reaction, pt feels much better.  Discharged via WC.  Teola Bradley, Aiman Sonn [employer]

## 2012-09-03 NOTE — Progress Notes (Signed)
Pt's dgtr called on 09/02/12 requesting a BSC, walker, and home PT for progressive weakness. They are afraid she is going to fall. Faxed order to [facility] after signed by Dr Myna Hidalgo.

## 2012-09-03 NOTE — Patient Instructions (Signed)
Blood Transfusion   A blood transfusion replaces your blood or some of its parts. Blood is replaced when you have lost blood because of surgery, an accident, or for severe blood conditions like anemia.  You can donate blood to be used on yourself if you have a planned surgery. If you lose blood during that surgery, your own blood can be given back to you.  Any blood given to you is checked to make sure it matches your blood type. Your temperature, blood pressure, and heart rate (vital signs) will be checked often.   GET HELP RIGHT AWAY IF:    You feel sick to your stomach (nauseous) or throw up (vomit).   You have watery poop (diarrhea).   You have shortness of breath or trouble breathing.   You have blood in your pee (urine) or have dark colored pee.   You have chest pain or tightness.   Your eyes or skin turn yellow (jaundice).   You have a temperature by mouth above 102 F (38.9 C), not controlled by medicine.   You start to shake and have chills.   You develop a a red rash (hives) or feel itchy.   You develop lightheadedness or feel confused.   You develop back, joint, or muscle pain.   You do not feel hungry (lost appetite).   You feel tired, restless, or nervous.   You develop belly (abdominal) cramps.  Document Released: 10/25/2008 Document Revised: 10/21/2011 Document Reviewed: 10/25/2008  [employer] Patient Information 2013 [employer].

## 2012-09-03 NOTE — Progress Notes (Signed)
09/03/2012 Patient present with weakness, BP low, Dr Myna Hidalgo notified, will hold off treatment today and give IVFs. To receive 2 units PRBCs tomorrow.  When patient was walking out to the lobby, became weak and unstable on feet. Patient tearful. Brought back to infusion area. Patient's K level back, will treat the hypokalemia and give one unit of blood today. Teola Bradley, Sharlyn Odonnel [employer]

## 2012-09-03 NOTE — Progress Notes (Signed)
This office note has been dictated.

## 2012-09-04 ENCOUNTER — Ambulatory Visit: Payer: Self-pay | Admitting: Neurosurgery

## 2012-09-04 ENCOUNTER — Ambulatory Visit (HOSPITAL_BASED_OUTPATIENT_CLINIC_OR_DEPARTMENT_OTHER): Payer: Medicare Other

## 2012-09-04 ENCOUNTER — Other Ambulatory Visit: Payer: Medicare Other | Admitting: Lab

## 2012-09-04 VITALS — BP 122/83 | HR 72 | Temp 97.2°F | Resp 18

## 2012-09-04 DIAGNOSIS — C221 Intrahepatic bile duct carcinoma: Secondary | ICD-10-CM

## 2012-09-04 DIAGNOSIS — D649 Anemia, unspecified: Secondary | ICD-10-CM

## 2012-09-04 MED ORDER — HEPARIN SOD (PORK) LOCK FLUSH 100 UNIT/ML IV SOLN
500.0000 [IU] | Freq: Every day | INTRAVENOUS | Status: AC | PRN
  Administered 2012-09-04: 500 [IU]
  Filled 2012-09-04: qty 5

## 2012-09-04 MED ORDER — SODIUM CHLORIDE 0.9 % IV SOLN
250.0000 mL | Freq: Once | INTRAVENOUS | Status: AC
  Administered 2012-09-04: 250 mL via INTRAVENOUS

## 2012-09-04 MED ORDER — SODIUM CHLORIDE 0.9 % IJ SOLN
10.0000 mL | INTRAMUSCULAR | Status: AC | PRN
  Administered 2012-09-04: 10 mL
  Filled 2012-09-04: qty 10

## 2012-09-04 MED ORDER — DIPHENHYDRAMINE HCL 25 MG PO CAPS
25.0000 mg | ORAL_CAPSULE | Freq: Once | ORAL | Status: AC
  Administered 2012-09-04: 25 mg via ORAL

## 2012-09-04 MED ORDER — ACETAMINOPHEN 325 MG PO TABS
650.0000 mg | ORAL_TABLET | Freq: Once | ORAL | Status: AC
  Administered 2012-09-04: 650 mg via ORAL

## 2012-09-04 NOTE — Patient Instructions (Signed)
Blood Transfusion   A blood transfusion replaces your blood or some of its parts. Blood is replaced when you have lost blood because of surgery, an accident, or for severe blood conditions like anemia.  You can donate blood to be used on yourself if you have a planned surgery. If you lose blood during that surgery, your own blood can be given back to you.  Any blood given to you is checked to make sure it matches your blood type. Your temperature, blood pressure, and heart rate (vital signs) will be checked often.   GET HELP RIGHT AWAY IF:    You feel sick to your stomach (nauseous) or throw up (vomit).   You have watery poop (diarrhea).   You have shortness of breath or trouble breathing.   You have blood in your pee (urine) or have dark colored pee.   You have chest pain or tightness.   Your eyes or skin turn yellow (jaundice).   You have a temperature by mouth above 102 F (38.9 C), not controlled by medicine.   You start to shake and have chills.   You develop a a red rash (hives) or feel itchy.   You develop lightheadedness or feel confused.   You develop back, joint, or muscle pain.   You do not feel hungry (lost appetite).   You feel tired, restless, or nervous.   You develop belly (abdominal) cramps.  Document Released: 10/25/2008 Document Revised: 10/21/2011 Document Reviewed: 10/25/2008  [employer] Patient Information 2013 [employer].

## 2012-09-04 NOTE — Progress Notes (Signed)
DIAGNOSIS:  Metastatic cholangiocarcinoma.  CURRENT THERAPY:  The patient is on her third cycle of Abraxane.  INTERIM HISTORY:  Ms. Foley comes in today not feeling well at all.  She almost passed out in the waiting room.  As such, we brought her back and did some lab work on her.  We found out her potassium was only 2.9.  She was somewhat anemic.  Hemoglobin was 8.4.  I am sure with hydration this will drop.  As such, we gave her 1 unit of blood today and we will give her 1 unit tomorrow.  We did give her 40 mEq of potassium by mouth and 20 mEq by IV.  She has a poor appetite.  I think she is getting to her limit of chemotherapy.  She really has been through a lot of chemotherapy.  She has had, I think, 4 different courses of chemotherapy.  I am just not sure how much more she is going to be able to really tolerate.  She has had no fever.  She has had no obvious bleeding.  There has been no diarrhea.  She has had no cough.  PHYSICAL EXAMINATION:  General:  This is a somewhat ill-appearing black female in no obvious distress.  Vital signs:  Temperature 98.1, pulse 71, respiratory rate 20, blood pressure 111/75.  Head and neck: Normocephalic, atraumatic skull.  There are no ocular or oral lesions. Oral mucosa is slightly dry.  Lungs:  Clear bilaterally.  There are no rales, wheezes, or rhonchi.  Cardiac:  Regular rate and rhythm with a normal S1 and S2.  There are no murmurs, rubs, or bruits.  Abdomen: Soft.  There is some slight tenderness to palpation.  There is no guarding or rebound tenderness.  There is no obvious abdominal mass. There is no fluid wave.  Extremities:  No clubbing, cyanosis, or edema. Neurological:  No focal neurological deficits.  LABORATORY STUDIES:  White cell count 4.8, hemoglobin 8.4, hematocrit 25.4, platelet count 144.  Sodium 135, potassium 2.9, BUN 16, creatinine 0.8.  Calcium is 8.8 and albumin is 2.4.  IMPRESSION:  Ms. Ekstrand is a 62 year old  African American female with metastatic cholangiocarcinoma.  She actually is almost 2 years out from diagnosis.  She has done incredibly well.  Again, her quality of life is suffering.  I just do not know how much more she will be able to tolerate chemotherapy-wise.  I just want to hold her chemotherapy for right now.  I want to get her set up with scans.  We will see what the scans show.  I would only consider further chemotherapy if we find that her scans are markedly improved.  I will have the scans set up for the next week or so.  She is coming in tomorrow for another unit of blood.    ______________________________ Josph Macho, M.D. PRE/MEDQ  D:  09/03/2012  T:  09/04/2012  Job:  [id number]

## 2012-09-05 LAB — TYPE AND SCREEN: Unit division: 0

## 2012-09-10 ENCOUNTER — Other Ambulatory Visit: Payer: Medicare Other | Admitting: Lab

## 2012-09-10 ENCOUNTER — Ambulatory Visit: Payer: Medicare Other | Admitting: Hematology & Oncology

## 2012-09-10 ENCOUNTER — Ambulatory Visit: Payer: Medicare Other

## 2012-09-11 ENCOUNTER — Ambulatory Visit (HOSPITAL_BASED_OUTPATIENT_CLINIC_OR_DEPARTMENT_OTHER): Payer: Medicare Other

## 2012-09-11 ENCOUNTER — Ambulatory Visit (HOSPITAL_BASED_OUTPATIENT_CLINIC_OR_DEPARTMENT_OTHER): Admission: RE | Admit: 2012-09-11 | Payer: Medicare Other | Source: Ambulatory Visit

## 2012-09-11 ENCOUNTER — Other Ambulatory Visit: Payer: Self-pay | Admitting: Hematology & Oncology

## 2012-09-11 DIAGNOSIS — R509 Fever, unspecified: Secondary | ICD-10-CM

## 2012-09-11 MED ORDER — CEFDINIR 300 MG PO CAPS: ORAL_CAPSULE | ORAL | Status: DC

## 2012-09-14 ENCOUNTER — Other Ambulatory Visit: Payer: Medicare Other | Admitting: Lab

## 2012-09-14 ENCOUNTER — Ambulatory Visit (HOSPITAL_BASED_OUTPATIENT_CLINIC_OR_DEPARTMENT_OTHER)
Admission: RE | Admit: 2012-09-14 | Discharge: 2012-09-14 | Disposition: A | Discharge status: Home / Self Care | Payer: Medicare Other | Source: Ambulatory Visit | Attending: Medical | Admitting: Medical

## 2012-09-14 ENCOUNTER — Ambulatory Visit: Payer: Medicare Other

## 2012-09-14 ENCOUNTER — Ambulatory Visit: Payer: Medicare Other | Admitting: Hematology & Oncology

## 2012-09-14 ENCOUNTER — Encounter (HOSPITAL_BASED_OUTPATIENT_CLINIC_OR_DEPARTMENT_OTHER): Payer: Self-pay

## 2012-09-14 VITALS — BP 121/84 | HR 76 | Temp 98.7°F | Resp 16

## 2012-09-14 DIAGNOSIS — J90 Pleural effusion, not elsewhere classified: Secondary | ICD-10-CM | POA: Insufficient documentation

## 2012-09-14 DIAGNOSIS — I10 Essential (primary) hypertension: Secondary | ICD-10-CM | POA: Insufficient documentation

## 2012-09-14 DIAGNOSIS — C23 Malignant neoplasm of gallbladder: Secondary | ICD-10-CM

## 2012-09-14 DIAGNOSIS — C221 Intrahepatic bile duct carcinoma: Secondary | ICD-10-CM | POA: Insufficient documentation

## 2012-09-14 DIAGNOSIS — C7800 Secondary malignant neoplasm of unspecified lung: Secondary | ICD-10-CM

## 2012-09-14 MED ORDER — HEPARIN SOD (PORK) LOCK FLUSH 100 UNIT/ML IV SOLN
500.0000 [IU] | Freq: Once | INTRAVENOUS | Status: AC
  Filled 2012-09-14: qty 5

## 2012-09-14 MED ORDER — SODIUM CHLORIDE 0.9 % IJ SOLN
10.0000 mL | INTRAMUSCULAR | Status: AC | PRN
  Filled 2012-09-14: qty 10

## 2012-09-14 MED ORDER — IOHEXOL 300 MG/ML  SOLN
100.0000 mL | Freq: Once | INTRAMUSCULAR | Status: AC | PRN
  Administered 2012-09-14: 100 mL via INTRAVENOUS

## 2012-09-14 NOTE — Progress Notes (Unsigned)
Patient port cath was exccessed and deacessed by Alvino Chapel forward.

## 2012-09-16 ENCOUNTER — Encounter: Payer: Self-pay | Admitting: Neurosurgery

## 2012-09-17 ENCOUNTER — Ambulatory Visit (HOSPITAL_BASED_OUTPATIENT_CLINIC_OR_DEPARTMENT_OTHER): Payer: Medicare Other | Admitting: Hematology & Oncology

## 2012-09-17 ENCOUNTER — Encounter: Payer: Self-pay | Admitting: Pharmacist

## 2012-09-17 ENCOUNTER — Other Ambulatory Visit (HOSPITAL_BASED_OUTPATIENT_CLINIC_OR_DEPARTMENT_OTHER): Payer: Medicare Other | Admitting: Lab

## 2012-09-17 ENCOUNTER — Ambulatory Visit: Payer: Self-pay | Admitting: Neurosurgery

## 2012-09-17 VITALS — BP 141/78 | HR 70 | Temp 98.5°F | Resp 18 | Ht 65.0 in | Wt 139.0 lb

## 2012-09-17 DIAGNOSIS — C7800 Secondary malignant neoplasm of unspecified lung: Secondary | ICD-10-CM

## 2012-09-17 DIAGNOSIS — K922 Gastrointestinal hemorrhage, unspecified: Secondary | ICD-10-CM

## 2012-09-17 DIAGNOSIS — R1011 Right upper quadrant pain: Secondary | ICD-10-CM

## 2012-09-17 DIAGNOSIS — R899 Unspecified abnormal finding in specimens from other organs, systems and tissues: Secondary | ICD-10-CM

## 2012-09-17 DIAGNOSIS — D509 Iron deficiency anemia, unspecified: Secondary | ICD-10-CM

## 2012-09-17 DIAGNOSIS — C221 Intrahepatic bile duct carcinoma: Secondary | ICD-10-CM

## 2012-09-17 LAB — CBC WITH DIFFERENTIAL (CANCER CENTER ONLY)
BASO%: 0.1 % (ref 0.0–2.0)
EOS%: 0.4 % (ref 0.0–7.0)
HCT: 32.6 % — ABNORMAL LOW (ref 34.8–46.6)
LYMPH#: 0.8 10*3/uL — ABNORMAL LOW (ref 0.9–3.3)
LYMPH%: 8.8 % — ABNORMAL LOW (ref 14.0–48.0)
MCHC: 32.5 g/dL (ref 32.0–36.0)
NEUT%: 79.7 % (ref 39.6–80.0)
RDW: 15.2 % (ref 11.1–15.7)

## 2012-09-17 NOTE — Progress Notes (Signed)
This office note has been dictated.

## 2012-09-18 ENCOUNTER — Other Ambulatory Visit: Payer: Self-pay | Admitting: *Deleted

## 2012-09-18 ENCOUNTER — Telehealth: Payer: Self-pay | Admitting: Hematology & Oncology

## 2012-09-18 DIAGNOSIS — C7800 Secondary malignant neoplasm of unspecified lung: Secondary | ICD-10-CM

## 2012-09-18 DIAGNOSIS — C221 Intrahepatic bile duct carcinoma: Secondary | ICD-10-CM

## 2012-09-18 MED ORDER — OXYCODONE HCL 5 MG PO CAPS: 5.0000 mg | ORAL_CAPSULE | Freq: Four times a day (QID) | ORAL | Status: DC | PRN

## 2012-09-18 MED ORDER — LORAZEPAM 0.5 MG PO TABS: 0.5000 mg | ORAL_TABLET | Freq: Four times a day (QID) | ORAL | Status: DC | PRN

## 2012-09-18 MED ORDER — MORPHINE SULFATE 15 MG PO TABS: 15.0000 mg | ORAL_TABLET | Freq: Four times a day (QID) | ORAL | Status: DC | PRN

## 2012-09-18 NOTE — Telephone Encounter (Signed)
Pt aware 2-13 cx and 2-28 is scheduled to see provider

## 2012-09-18 NOTE — Progress Notes (Signed)
CC:   Adolph Pollack, M.D. Sherry Mech, MD  DIAGNOSIS:  Metastatic cholangiocarcinoma.  CURRENT THERAPY:  The patient status post 3 cycles of weekly Abraxane.  INTERIM HISTORY:  [name] comes in for followup.  She still feels tired.  I think the chemotherapy has really worn her out.  We did go ahead and repeat her scans.  The CT scan of the chest/abdomen/pelvis did not show any evidence of obvious tumor progression.  However, there was noted to be a fluid collection within the surgical bed.  I am not sure at all what this really means.  This collection measures 6.3 x 7.4 cm. I will speak with Dr. Abbey Chatters of Surgery.  We will see what he says about this.  It is possible this fluid collection may need to be drained.  She is complaining of some right upper quadrant abdominal pain but this is more chronic than anything else.  I really believe that she is at her limit right now with chemotherapy. Whenever she takes chemo, even with a reduced dose, she really has a poor quality of life.  Her appetite has been poor.  She does not do a lot.  She sleeps a lot.  We did give her some blood I think a couple weeks ago.  Again, she is feeling a little better.  This, I believe, is because she is off chemotherapy.  She is having a little nausea but no vomiting.  There is no diarrhea. She has had chronic leg pain from peripheral vascular disease.  This is unchanged.  Currently, her performance status is ECOG 3.  PHYSICAL EXAMINATION:  General:  This is a somewhat feeble appearing black female in no obvious distress.  Vital signs:  Show temperature of 98.5, pulse 70, respiratory rate 18, blood pressure 141/78.  Weight is 139.  Head and neck:  Shows a normocephalic, atraumatic skull.  There are no ocular or oral lesions.  There are no palpable cervical or supraclavicular lymph nodes.  Lungs:  Clear bilaterally.  Cardiac: Regular rate and rhythm with a normal S1, S2.  There are  no murmurs, rubs or bruits.  Abdomen:  Soft with good bowel sounds.  There may be some slight tenderness in the right upper quadrant.  No obvious masses noted.  There is no fluid wave.  There is no palpable hepatosplenomegaly.  Extremities:  Show some trace edema in her legs. She has good range of motion of her joints.  Muscle strength is 4/5 bilaterally.  Skin:  Shows some slightly dry skin.  Neurological:  No focal neurological deficits.  LABORATORY STUDIES:  White cell count is 9.3, hemoglobin 10.6, hematocrit 32.6, platelet count 154.  IMPRESSION:  [name] is a 62 year old African American female with metastatic cholangiocarcinoma.  She has been dealing with this now for almost 2 years.  She has done incredibly well.  I got to say she has certainly outlived 95% of patients who have metastatic cholangiocarcinoma.  Unfortunately, she just is not a candidate for systemic chemotherapy right now.  I just do not feel that her performance status is good enough that we need to risk probably causing problems.  I will need to speak with the surgeon about this fluid collection.  I am not sure if this is causing her any problems.  We will plan to get her back to see [place] in another 3 weeks or so.  I want to try to hold off on another CAT scan for about 6 weeks.  I think as long as we see stability of her cancer, we do not need to put her through therapy.    ______________________________ Josph Macho, M.D. PRE/MEDQ  D:  09/17/2012  T:  09/18/2012  Job:  [id number]

## 2012-09-18 NOTE — Telephone Encounter (Signed)
Pt's called. When her husband went to pick up her Oxy-IR he stated it couldn't be filled until they received a call from the office. Called [facility]. They stated that it was no longer covered by her insurance. It was determined that MSIR was going to be the cheapest for her. Reviewed with Dr Myna Hidalgo. Ok to start on MSIR 15 mg po q6h prn. Made Mr and [name] aware. They will come by to pick up the rx.

## 2012-09-22 ENCOUNTER — Other Ambulatory Visit: Payer: Self-pay | Admitting: Radiology

## 2012-09-22 ENCOUNTER — Encounter (HOSPITAL_COMMUNITY): Payer: Self-pay | Admitting: Pharmacy Technician

## 2012-09-24 ENCOUNTER — Other Ambulatory Visit: Payer: Medicare Other | Admitting: Lab

## 2012-09-24 ENCOUNTER — Ambulatory Visit: Payer: Medicare Other

## 2012-09-25 ENCOUNTER — Ambulatory Visit (HOSPITAL_COMMUNITY): Admission: RE | Admit: 2012-09-25 | Payer: Medicare Other | Source: Ambulatory Visit

## 2012-09-25 ENCOUNTER — Ambulatory Visit (HOSPITAL_COMMUNITY)
Admission: RE | Admit: 2012-09-25 | Discharge: 2012-09-25 | Disposition: A | Discharge status: Home / Self Care | Payer: Medicare Other | Source: Ambulatory Visit | Attending: Hematology & Oncology | Admitting: Hematology & Oncology

## 2012-09-30 ENCOUNTER — Ambulatory Visit (HOSPITAL_COMMUNITY)
Admission: RE | Admit: 2012-09-30 | Discharge: 2012-09-30 | Disposition: A | Discharge status: Home / Self Care | Payer: Medicare Other | Source: Ambulatory Visit | Attending: Hematology & Oncology | Admitting: Hematology & Oncology

## 2012-09-30 ENCOUNTER — Other Ambulatory Visit: Payer: Self-pay | Admitting: Hematology & Oncology

## 2012-09-30 DIAGNOSIS — C221 Intrahepatic bile duct carcinoma: Secondary | ICD-10-CM

## 2012-09-30 DIAGNOSIS — C7800 Secondary malignant neoplasm of unspecified lung: Secondary | ICD-10-CM | POA: Insufficient documentation

## 2012-09-30 DIAGNOSIS — J90 Pleural effusion, not elsewhere classified: Secondary | ICD-10-CM | POA: Insufficient documentation

## 2012-09-30 DIAGNOSIS — R911 Solitary pulmonary nodule: Secondary | ICD-10-CM | POA: Insufficient documentation

## 2012-09-30 DIAGNOSIS — E278 Other specified disorders of adrenal gland: Secondary | ICD-10-CM | POA: Insufficient documentation

## 2012-09-30 LAB — CBC
MCHC: 31.5 g/dL (ref 30.0–36.0)
Platelets: 180 10*3/uL (ref 150–400)
RDW: 15.6 % — ABNORMAL HIGH (ref 11.5–15.5)

## 2012-09-30 LAB — PROTIME-INR
INR: 1.11 (ref 0.00–1.49)
Prothrombin Time: 14.2 seconds (ref 11.6–15.2)

## 2012-09-30 LAB — APTT: aPTT: 32 seconds (ref 24–37)

## 2012-09-30 MED ORDER — MIDAZOLAM HCL 2 MG/2ML IJ SOLN
INTRAMUSCULAR | Status: AC
  Filled 2012-09-30: qty 6

## 2012-09-30 MED ORDER — HEPARIN SOD (PORK) LOCK FLUSH 100 UNIT/ML IV SOLN
500.0000 [IU] | INTRAVENOUS | Status: DC
  Filled 2012-09-30 (×2): qty 5

## 2012-09-30 MED ORDER — SODIUM CHLORIDE 0.9 % IJ SOLN
10.0000 mL | Freq: Once | INTRAMUSCULAR | Status: AC
  Administered 2012-09-30: 10 mL

## 2012-09-30 MED ORDER — SODIUM CHLORIDE 0.9 % IV SOLN
INTRAVENOUS | Status: DC
  Administered 2012-09-30: 09:00:00 via INTRAVENOUS

## 2012-09-30 MED ORDER — HEPARIN SOD (PORK) LOCK FLUSH 100 UNIT/ML IV SOLN
500.0000 [IU] | INTRAVENOUS | Status: DC | PRN
  Administered 2012-09-30: 500 [IU]
  Filled 2012-09-30: qty 5

## 2012-09-30 MED ORDER — FENTANYL CITRATE 0.05 MG/ML IJ SOLN
INTRAMUSCULAR | Status: AC
  Filled 2012-09-30: qty 6

## 2012-09-30 NOTE — ED Notes (Signed)
Procedure cancelled.

## 2012-09-30 NOTE — H&P (Signed)
[name] is an 62 y.o. female.   Chief Complaint: abdominal fluid collection HPI: Patient with history of metastatic cholangiocarcinoma, s/p partial right hepatectomy in [facility], who presents today for aspiration of complex fluid/gas collection in surgical bed.  Past Medical History  Diagnosis Date  . Hypertension   . Hyperlipidemia   . GERD (gastroesophageal reflux disease)   . Depression   . Esophageal stricture 2003     [place] post esophageal dilation in 2003 performed by Dr. Marina Goodell, second esophageal dilation performed in 2006  by Dr. Marina Goodell  . Obstructive jaundice due to malignant neoplasm  February 2012     status post ERCP done by Dr. Marina Goodell with biliary sphincterotomy, bile duct stricture brushings, biliary stent placement, performed secondary to obstructive jaundice, malignant-appearing bile duct stricture of the common hepatic duct worrisome for primary cholangiocarcinoma, status post ERCP with biliary sphincterotomy and biliary stent placement...status post biopsy -  markedly atypical cells cons  . Rotator cuff syndrome 2010     per MRI March 2010 there is a prominent articular surface partial tear of the supraspinatus which is nearly full thickness, as well as a tear of the distal anterior supraspinatus which probably represents a full-thickness partial tear,  with moderate supraspinatus tendinopathy, moderate infraspinatus tendinopathy and prominent undersurface tear of the acromion along the coracoacromial ligament  . Hiatal hernia   . SOB (shortness of breath) on exertion   . Bile duct adenocarcinoma   . Myocardial infarction   . Angina   . Headache   . Arthritis   . Dysrhythmia   . Peripheral vascular disease   . Anemia   . Diarrhea   . Nausea   . Cholangiocarcinoma   . Complication of anesthesia   . Pneumonia 08/13/2012    Past Surgical History  Procedure Laterality Date  . Cholecystectomy   2007  .  aortogram    August 2011     ultrasound-guided access to  the right common femoral artery, an aortogram with bilateral iliac arteriogram and bilateral lower extremity runoff, this was performed secondary to progressive bilateral lower extremity claudication, left common iliac artery occlusion as well as short segment of right superficial femoral artery occlusion, procedure performed by Dr. Durwin Nora  . Bypass graft   January 2009     of the infected femoral-femoral graft with vein patch angioplasty of bilateral femoral artery, 7 by [name],   .  abdominal aortic angiography   September 2008     performed by Dr. 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 Cardiac catheterization   August 2011     patent coronary arteries with diffuse nonobstructive disease, there are no areas of high grade stenosis present,  determined the chest pain is most likely noncardiac in origin, continue medical management.  . Bile duct surgery      60% of liver removed  . Arthroscopy knee w/ drilling    . Esophagogastroduodenoscopy  07/10/2011    Procedure: ESOPHAGOGASTRODUODENOSCOPY (EGD);  Surgeon: Yancey Flemings, MD;  Location: [facility];  Service: Endoscopy;  Laterality: N/A;  . Liver resection      section of liver resceted due to bile duct ca  . Esophagogastroduodenoscopy  07/20/2011    Procedure: ESOPHAGOGASTRODUODENOSCOPY (EGD);  Surgeon: Erick Blinks, MD;  Location: [facility];  Service: Gastroenterology;  Laterality: N/A;  . Esophagogastroduodenoscopy  07/31/2011  Procedure: ESOPHAGOGASTRODUODENOSCOPY (EGD);  Surgeon: Yancey Flemings, MD;  Location: [facility];  Service: Endoscopy;  Laterality: N/A;  . Mass excision  03/05/2012    Procedure: EXCISION MASS;  Surgeon: Adolph Pollack, MD;  Location: [facility];  Service: General;  Laterality: N/A;  Right chest wall    Family History  Problem  Relation Age of Onset  . Heart disease Brother    Social History:  reports that she quit smoking about 13 years ago. She has never used smokeless tobacco. She reports that she does not drink alcohol or use illicit drugs.  Allergies:  Allergies  Allergen Reactions  . Promethazine Hcl Anxiety and Other (See Comments)    Change in LOC  . Sulfonamide Derivatives Hives    All over the body    Current outpatient prescriptions:Alpha Lipoic Acid 200 MG CAPS, Take 200 mg by mouth daily., Disp: , Rfl: ;  B Complex Vitamins (VITAMIN-B COMPLEX) TABS, Take by mouth every morning., Disp: , Rfl: ;  docusate sodium (COLACE) 100 MG capsule, Take 200 mg by mouth daily., Disp: , Rfl: ;  Ensure Plus (ENSURE PLUS) LIQD, Take 237 mLs by mouth daily as needed (for meal supplement)., Disp: , Rfl:  gabapentin (NEURONTIN) 300 MG capsule, Take 300 mg by mouth 3 (three) times daily., Disp: , Rfl: ;  LORazepam (ATIVAN) 0.5 MG tablet, Take 0.5 mg by mouth every 8 (eight) hours as needed (for nausea)., Disp: , Rfl: ;  metoprolol succinate (TOPROL-XL) 100 MG 24 hr tablet, Take 100 mg by mouth every morning. Take with or immediately following a meal., Disp: , Rfl:  morphine (MSIR) 15 MG tablet, Take 1 tablet (15 mg total) by mouth every 6 (six) hours as needed for pain., Disp: 90 tablet, Rfl: 0;  omeprazole (PRILOSEC) 20 MG capsule, Take 20 mg by mouth daily., Disp: , Rfl: ;  polyethylene glycol (MIRALAX / GLYCOLAX) packet, Take 17 g by mouth 2 (two) times daily., Disp: 24 each, Rfl: 1;  lidocaine-prilocaine (EMLA) cream, Apply 1 application topically daily as needed (apply to port site)., Disp: , Rfl:  Current facility-administered medications:0.9 %  sodium chloride infusion, , Intravenous, Continuous, Brayton El, PA, Last Rate: 20 mL/hr at 09/30/12 0910 Facility-Administered Medications Ordered in Other Encounters: heparin lock flush 100 unit/mL, 500 Units, Intravenous, Once, Josph Macho, MD;  sodium chloride 0.9 %  injection 10 mL, 10 mL, Intravenous, PRN, Josph Macho, MD   Results for orders placed during the hospital encounter of 09/30/12 (from the past 48 hour(s))  APTT     Status: None   Collection Time    09/30/12  9:05 AM      Result Value Range   aPTT 32  24 - 37 seconds  CBC     Status: Abnormal   Collection Time    09/30/12  9:05 AM      Result Value Range   WBC 9.8  4.0 - 10.5 K/uL   RBC 3.17 (*) 3.87 - 5.11 MIL/uL   Hemoglobin 9.5 (*) 12.0 - 15.0 g/dL   HCT 16.1 (*) 09.6 - 04.5 %   MCV 95.3  78.0 - 100.0 fL   MCH 30.0  26.0 - 34.0 pg   MCHC 31.5  30.0 - 36.0 g/dL   RDW 40.9 (*) 81.1 - 91.4 %   Platelets 180  150 - 400 K/uL  PROTIME-INR     Status: None   Collection Time    09/30/12  9:05 AM  Result Value Range   Prothrombin Time 14.2  11.6 - 15.2 seconds   INR 1.11  0.00 - 1.49   No results found.  Review of Systems  Constitutional: Positive for malaise/fatigue.       Occ fevers/chills  Respiratory: Positive for cough and shortness of breath.   Cardiovascular: Negative for chest pain.  Gastrointestinal: Positive for nausea, vomiting and abdominal pain.  Musculoskeletal: Positive for back pain.       Chronic leg pain from PVD  Neurological: Positive for headaches.  Endo/Heme/Allergies: Does not bruise/bleed easily.    Blood pressure 121/82, pulse 74, temperature 97.9 F (36.6 C), temperature source Oral, resp. rate 18, height 5\' 5"  (1.651 m), weight 139 lb (63.05 kg), SpO2 100.00%. Physical Exam  Constitutional: She is oriented to person, place, and time.  Frail appearing BF  Cardiovascular: Normal rate and regular rhythm.   Respiratory: Effort normal.  Dim BS bases,R>L  GI: Soft. Bowel sounds are normal. There is tenderness.  Musculoskeletal: Normal range of motion.  Trace edema LE's  Neurological: She is alert and oriented to person, place, and time.     Assessment/Plan Pt with metastatic cholangiocarcinoma and hx of partial right hepatectomy; now  with complex fluid/gas collection in surgical bed. Plan is for CT guided aspiration of the collection. Details/risks of procedure d/w pt/family with their understanding and consent.  Jetson Pickrel,D KEVIN 09/30/2012, 9:45 AM

## 2012-09-30 NOTE — Progress Notes (Signed)
Dr Lowella Dandy visited pt and family and discussed his findings. Pt will be following up with Dr Myna Hidalgo

## 2012-10-02 ENCOUNTER — Encounter: Payer: Self-pay | Admitting: Internal Medicine

## 2012-10-02 ENCOUNTER — Ambulatory Visit (INDEPENDENT_AMBULATORY_CARE_PROVIDER_SITE_OTHER): Payer: Medicare Other | Admitting: Internal Medicine

## 2012-10-02 VITALS — BP 101/74 | HR 74 | Temp 98.7°F | Ht 63.0 in | Wt 138.9 lb

## 2012-10-02 DIAGNOSIS — C7800 Secondary malignant neoplasm of unspecified lung: Secondary | ICD-10-CM

## 2012-10-02 DIAGNOSIS — C221 Intrahepatic bile duct carcinoma: Secondary | ICD-10-CM

## 2012-10-02 NOTE — Assessment & Plan Note (Signed)
Patient is on chemotherapy holiday and is still feeling weak. She continues to lose weight and is not able to perform all of her ADLs anymore. 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 Repeat CT in 4-6 weeks to follow if lesions are growing while on holiday. She may not be candidate for further chemotherapy. Started discussion about goals of care and hospice and will continue at next visit. Patient is currently wishing to pursue more chemotherapy.

## 2012-10-02 NOTE — Patient Instructions (Signed)
We will see you back in 3 months unless you want to come back sooner. If you need anything call us at [phone]. Remember that you always get to make the final decisions about what you do and you don't want to do. I'll be keeping you in my thoughts until you come back.

## 2012-10-02 NOTE — Progress Notes (Signed)
Subjective:     Patient ID: [name], female   DOB: 12/14/1950, 62 y.o.   MRN: [id number]  HPI The patient is a 62 YO female who comes in for follow up of her metastatic cholangiocarcinoma. She is following with med onc at [facility] and she is currently on chemotherapy holiday and Dr. Drue Dun has concerns that she may not have high enough performance status to undergo any additional chemotherapy. 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 I talked extensively with her about prognosis and current status about possibly not being strong enough for more chemotherapy to be beneficial and that goals of chemotherapy at this time were not curative. Talked with her extensively about hospice and goals of care. At this time she wishes to pursue further chemotherapy if possible and will see about what the surgeon thinks can be done about the air collection. Counseled the patient that the final deciding capacity to get or not get a treatment resides with her. She is having some pain which is well controlled with her current pain medication which she receives from Dr. Myna Hidalgo. Did talk to the patient about the fact that this cancer will likely take her life at some point. She is scheduled to have CT scan to check for progression off chemotherapy in about 6 weeks.   Review of Systems  Constitutional: Positive for activity change, appetite change and unexpected weight change. Negative for fever, chills, diaphoresis and fatigue.       Patient has continued to lose some weight and is unable to perform some ADLs without assistance. Needs help bathing although is able to dress and eat by herself. She does not do any of the cooking or housework  anymore.   HENT: Negative.   Eyes: Negative.   Respiratory: Negative for cough, choking, chest tightness, shortness of breath and wheezing.   Cardiovascular: Negative for chest pain, palpitations and leg swelling.  Gastrointestinal: Positive for nausea and abdominal pain. Negative for vomiting, diarrhea, constipation, blood in stool, abdominal distention, anal bleeding and rectal pain.  Musculoskeletal: Positive for myalgias and gait problem. Negative for back pain, joint swelling and arthralgias.  Skin: Negative.   Neurological: Positive for weakness. Negative for dizziness, tremors, seizures, syncope, facial asymmetry, speech difficulty, light-headedness, numbness and headaches.       Objective:   Physical Exam  Constitutional: She is oriented to person, place, and time. She appears well-developed and well-nourished. No distress.  In wheelchair with husband in waiting room  HENT:  Head: Normocephalic and atraumatic.  Not jaundiced appearing  Eyes: EOM are normal. Pupils are equal, round, and reactive to light.  Neck: Normal range of motion. Neck supple.  Cardiovascular: Normal rate, regular rhythm and normal heart sounds.   Pulmonary/Chest: Breath sounds normal. No respiratory distress. She has no wheezes. She exhibits no tenderness.  Abdominal: Soft. Bowel sounds are normal. There is tenderness. There is no rebound and no guarding.  Area RUQ tender to palpation  Musculoskeletal: She exhibits tenderness. She exhibits no edema.  Neurological: She is alert and oriented to person, place, and time.  Skin: Skin is warm and dry.  Psychiatric: She has a normal mood and affect.  Assessment/Plan:    1. Please see problem oriented charting.  2. Disposition - The patient will be seen back in 3 months. Otherwise she can come back sooner if she needs to. No rxs given at today's visit as symptoms well controlled at this time. Will continue the discussion about hospice and personal goals  of care with the patient. Will follow up after discussion with surgeon and repeat CT scan.

## 2012-10-09 ENCOUNTER — Other Ambulatory Visit (HOSPITAL_BASED_OUTPATIENT_CLINIC_OR_DEPARTMENT_OTHER): Payer: Medicare Other | Admitting: Lab

## 2012-10-09 ENCOUNTER — Ambulatory Visit (HOSPITAL_BASED_OUTPATIENT_CLINIC_OR_DEPARTMENT_OTHER): Payer: Medicare Other | Admitting: Medical

## 2012-10-09 VITALS — BP 131/86 | HR 64 | Temp 98.2°F | Resp 16 | Ht 63.0 in | Wt 136.0 lb

## 2012-10-09 DIAGNOSIS — C7800 Secondary malignant neoplasm of unspecified lung: Secondary | ICD-10-CM

## 2012-10-09 DIAGNOSIS — D509 Iron deficiency anemia, unspecified: Secondary | ICD-10-CM

## 2012-10-09 DIAGNOSIS — C779 Secondary and unspecified malignant neoplasm of lymph node, unspecified: Secondary | ICD-10-CM

## 2012-10-09 DIAGNOSIS — R933 Abnormal findings on diagnostic imaging of other parts of digestive tract: Secondary | ICD-10-CM

## 2012-10-09 DIAGNOSIS — C221 Intrahepatic bile duct carcinoma: Secondary | ICD-10-CM

## 2012-10-09 LAB — CBC WITH DIFFERENTIAL (CANCER CENTER ONLY)
BASO%: 0.2 % (ref 0.0–2.0)
EOS%: 1.2 % (ref 0.0–7.0)
LYMPH#: 0.7 10*3/uL — ABNORMAL LOW (ref 0.9–3.3)
MCHC: 31.6 g/dL — ABNORMAL LOW (ref 32.0–36.0)
NEUT#: 5 10*3/uL (ref 1.5–6.5)
NEUT%: 77.1 % (ref 39.6–80.0)
RDW: 15.6 % (ref 11.1–15.7)

## 2012-10-09 LAB — COMPREHENSIVE METABOLIC PANEL
AST: 22 U/L (ref 0–37)
BUN: 13 mg/dL (ref 6–23)
Calcium: 9 mg/dL (ref 8.4–10.5)
Chloride: 104 mEq/L (ref 96–112)
Creatinine, Ser: 0.79 mg/dL (ref 0.50–1.10)

## 2012-10-09 LAB — IRON AND TIBC
TIBC: 207 ug/dL — ABNORMAL LOW (ref 250–470)
UIBC: 158 ug/dL (ref 125–400)

## 2012-10-09 NOTE — Progress Notes (Signed)
DIAGNOSIS:  Metastatic cholangiocarcinoma.  CURRENT THERAPY:  The patient status post 3 cycles of weekly Abraxane.  INTERIM HISTORY: [name] presents today for an office followup visit.  Her husband accompanies her.  Currently, [name] is on a chemotherapy holiday, secondary to her performance status.  Unfortunately, she is not a candidate, to continue with her current chemotherapy.  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  It was recommended that she return to her surgeon down at [facility] for further evaluation.  I will call Dr. Karlyn Agee office to get an appointment set up with him.  She states, that she does felt better, since she's been off chemotherapy.  She does note, an increase in energy.  She is eating better, however, she still continues to eat a small amount at a time.  She's not reporting any nausea, vomiting, diarrhea, constipation, chest pain, shortness of breath, or cough.  She denies any fevers, chills, or night sweats.  She denies any lower leg swelling.  She denies any obvious, or abnormal bleeding.  She denies any headaches, visual changes, or rashes.  She does have some right upper quadrant abdominal pain, but this is more chronic than anything else.  Of note, she does have some chronic leg pain from peripheral vascular disease.  This is unchanged.  Currently, her performance status is ECOG 2-3.  Review of Systems: Constitutional:Negative for malaise/fatigue, fever, chills, weight loss, diaphoresis, activity change, appetite change, and unexpected weight change.  HEENT: Negative for double vision, blurred vision, visual loss, ear pain, tinnitus, congestion, rhinorrhea, epistaxis sore throat or sinus disease, oral pain/lesion, tongue soreness Respiratory: Negative for cough, chest tightness,  shortness of breath, wheezing and stridor.  Cardiovascular: Negative for chest pain, palpitations, leg swelling, orthopnea, PND, DOE or claudication Gastrointestinal: Negative for nausea, vomiting, abdominal pain, diarrhea, constipation, blood in stool, melena, hematochezia, abdominal distention, anal bleeding, rectal pain, anorexia and hematemesis.  Genitourinary: Negative for dysuria, frequency, hematuria,  Musculoskeletal: Negative for myalgias, back pain, joint swelling, arthralgias and gait problem.  Skin: Negative for rash, color change, pallor and wound.  Neurological:. Negative for dizziness/light-headedness, tremors, seizures, syncope, facial asymmetry, speech difficulty, weakness, numbness, headaches and paresthesias.  Hematological: Negative for adenopathy. Does not bruise/bleed easily.  Psychiatric/Behavioral:  Negative for depression, no loss of interest in normal activity or change in sleep pattern.   Physical Exam: This is a pleasant, frail-appearing 62 year old, F., American female, in no obvious distress Vitals: Temperature 98.2 degrees, pulse 64, respirations 16, blood pressure 131/86.  Weight 136 pounds HEENT reveals a normocephalic, atraumatic skull, no scleral icterus, no oral lesions  Neck is supple without any cervical or supraclavicular adenopathy.  Lungs are clear to auscultation bilaterally. There are no wheezes, rales or rhonci Cardiac is regular rate and rhythm with a normal S1 and S2. There are no murmurs, rubs, or bruits.  Abdomen is soft with good bowel sounds, there is no palpable mass. There is no palpable hepatosplenomegaly. There is no palpable fluid wave.  Musculoskeletal no tenderness of the spine, ribs, or hips.  Extremities there are no clubbing, cyanosis, or edema.  Skin no petechia, purpura or ecchymosis Neurologic is nonfocal.  Laboratory Data: White count 6.4, hemoglobin 9.7, hematocrit 30.7, platelets 150,000  Current Outpatient Prescriptions on  File Prior to Visit  Medication  Sig Dispense Refill  . Alpha Lipoic Acid 200 MG CAPS Take 200 mg by mouth daily.      . B Complex Vitamins (VITAMIN-B COMPLEX) TABS Take by mouth every morning.      . docusate sodium (COLACE) 100 MG capsule Take 200 mg by mouth daily.      . Ensure Plus (ENSURE PLUS) LIQD Take 237 mLs by mouth daily as needed (for meal supplement).      . gabapentin (NEURONTIN) 300 MG capsule Take 300 mg by mouth 3 (three) times daily.      Marland Kitchen lidocaine-prilocaine (EMLA) cream Apply 1 application topically daily as needed (apply to port site).      . LORazepam (ATIVAN) 0.5 MG tablet Take 0.5 mg by mouth every 8 (eight) hours as needed (for nausea).      . metoprolol succinate (TOPROL-XL) 100 MG 24 hr tablet Take 100 mg by mouth every morning. Take with or immediately following a meal.      . morphine (MSIR) 15 MG tablet Take 1 tablet (15 mg total) by mouth every 6 (six) hours as needed for pain.  90 tablet  0  . omeprazole (PRILOSEC) 20 MG capsule Take 20 mg by mouth daily.      . polyethylene glycol (MIRALAX / GLYCOLAX) packet Take 17 g by mouth 2 (two) times daily.  24 each  1    Assessment/Plan  This is a very pleasant, 62 year old, F., American female, with the following issues:  #1.  Metastatic cholangiocarcinoma.  She has been dealing with this now for 2 years.  Overall, she has done remarkably well.  Unfortunately, she is not a candidate to continue with systemic chemotherapy right now. I think we need to focus on quality of life.  She is agreeable with this.   #2.  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  I called Dr. Karlyn Agee office down at [facility] to set an appointment up for [name]  His office is supposed to call me back with an appointment time and date.   #3.  Followup.  [name] will follow back up with [place] in another 3 weeks.  At that time.  We will go ahead and set her up for repeat scans.

## 2012-10-22 ENCOUNTER — Other Ambulatory Visit: Payer: Self-pay | Admitting: Internal Medicine

## 2012-10-26 ENCOUNTER — Encounter: Payer: Self-pay | Admitting: *Deleted

## 2012-10-26 ENCOUNTER — Other Ambulatory Visit: Payer: Self-pay | Admitting: *Deleted

## 2012-10-26 NOTE — Progress Notes (Signed)
Pt called and told of her appt with Dr. Chauncey Mann at [facility] for Friday 10/30/2012 at 2pm. At the [facility] in [place].  Pt voiced understanding.

## 2012-10-27 MED ORDER — OMEPRAZOLE 20 MG PO CPDR: 20.0000 mg | DELAYED_RELEASE_CAPSULE | Freq: Every day | ORAL | Status: DC

## 2012-11-03 ENCOUNTER — Emergency Department (HOSPITAL_COMMUNITY): Payer: Medicare Other

## 2012-11-03 ENCOUNTER — Inpatient Hospital Stay (HOSPITAL_COMMUNITY)
Admission: EM | Admit: 2012-11-03 | Discharge: 2012-11-11 | DRG: 441 | Disposition: A | Discharge status: Hospice | Payer: Medicare Other | Attending: Internal Medicine | Admitting: Internal Medicine

## 2012-11-03 ENCOUNTER — Encounter (HOSPITAL_COMMUNITY): Payer: Self-pay | Admitting: *Deleted

## 2012-11-03 DIAGNOSIS — K219 Gastro-esophageal reflux disease without esophagitis: Secondary | ICD-10-CM | POA: Diagnosis present

## 2012-11-03 DIAGNOSIS — K651 Peritoneal abscess: Secondary | ICD-10-CM

## 2012-11-03 DIAGNOSIS — J90 Pleural effusion, not elsewhere classified: Secondary | ICD-10-CM | POA: Diagnosis present

## 2012-11-03 DIAGNOSIS — E876 Hypokalemia: Secondary | ICD-10-CM | POA: Diagnosis present

## 2012-11-03 DIAGNOSIS — D72829 Elevated white blood cell count, unspecified: Secondary | ICD-10-CM

## 2012-11-03 DIAGNOSIS — F329 Major depressive disorder, single episode, unspecified: Secondary | ICD-10-CM | POA: Diagnosis present

## 2012-11-03 DIAGNOSIS — C221 Intrahepatic bile duct carcinoma: Secondary | ICD-10-CM

## 2012-11-03 DIAGNOSIS — A0472 Enterocolitis due to Clostridium difficile, not specified as recurrent: Secondary | ICD-10-CM | POA: Diagnosis present

## 2012-11-03 DIAGNOSIS — R531 Weakness: Secondary | ICD-10-CM

## 2012-11-03 DIAGNOSIS — Z87891 Personal history of nicotine dependence: Secondary | ICD-10-CM

## 2012-11-03 DIAGNOSIS — Z66 Do not resuscitate: Secondary | ICD-10-CM | POA: Diagnosis not present

## 2012-11-03 DIAGNOSIS — K750 Abscess of liver: Secondary | ICD-10-CM | POA: Diagnosis present

## 2012-11-03 DIAGNOSIS — D696 Thrombocytopenia, unspecified: Secondary | ICD-10-CM | POA: Diagnosis present

## 2012-11-03 DIAGNOSIS — D649 Anemia, unspecified: Secondary | ICD-10-CM

## 2012-11-03 DIAGNOSIS — T451X5A Adverse effect of antineoplastic and immunosuppressive drugs, initial encounter: Secondary | ICD-10-CM

## 2012-11-03 DIAGNOSIS — E46 Unspecified protein-calorie malnutrition: Secondary | ICD-10-CM | POA: Diagnosis present

## 2012-11-03 DIAGNOSIS — Z6825 Body mass index (BMI) 25.0-25.9, adult: Secondary | ICD-10-CM

## 2012-11-03 DIAGNOSIS — I10 Essential (primary) hypertension: Secondary | ICD-10-CM | POA: Diagnosis present

## 2012-11-03 DIAGNOSIS — D6481 Anemia due to antineoplastic chemotherapy: Secondary | ICD-10-CM

## 2012-11-03 DIAGNOSIS — I739 Peripheral vascular disease, unspecified: Secondary | ICD-10-CM | POA: Diagnosis present

## 2012-11-03 DIAGNOSIS — E43 Unspecified severe protein-calorie malnutrition: Secondary | ICD-10-CM | POA: Diagnosis present

## 2012-11-03 DIAGNOSIS — R1084 Generalized abdominal pain: Secondary | ICD-10-CM | POA: Diagnosis present

## 2012-11-03 DIAGNOSIS — F3289 Other specified depressive episodes: Secondary | ICD-10-CM | POA: Diagnosis present

## 2012-11-03 DIAGNOSIS — E785 Hyperlipidemia, unspecified: Secondary | ICD-10-CM | POA: Diagnosis present

## 2012-11-03 DIAGNOSIS — R509 Fever, unspecified: Secondary | ICD-10-CM

## 2012-11-03 DIAGNOSIS — C7800 Secondary malignant neoplasm of unspecified lung: Secondary | ICD-10-CM | POA: Diagnosis present

## 2012-11-03 DIAGNOSIS — D638 Anemia in other chronic diseases classified elsewhere: Secondary | ICD-10-CM

## 2012-11-03 DIAGNOSIS — D630 Anemia in neoplastic disease: Secondary | ICD-10-CM | POA: Diagnosis present

## 2012-11-03 HISTORY — DX: Abscess of liver: K75.0

## 2012-11-03 HISTORY — DX: Intrahepatic bile duct carcinoma: C22.1

## 2012-11-03 HISTORY — DX: Secondary malignant neoplasm of unspecified lung: C78.00

## 2012-11-03 HISTORY — DX: Anemia in other chronic diseases classified elsewhere: D63.8

## 2012-11-03 HISTORY — DX: Unspecified protein-calorie malnutrition: E46

## 2012-11-03 HISTORY — DX: Enterocolitis due to Clostridium difficile, not specified as recurrent: A04.72

## 2012-11-03 LAB — COMPREHENSIVE METABOLIC PANEL
ALT: 34 U/L (ref 0–35)
ALT: 29 U/L (ref 0–35)
Alkaline Phosphatase: 133 U/L — ABNORMAL HIGH (ref 39–117)
Alkaline Phosphatase: 121 U/L — ABNORMAL HIGH (ref 39–117)
BUN: 12 mg/dL (ref 6–23)
CO2: 25 mEq/L (ref 19–32)
CO2: 24 mEq/L (ref 19–32)
Chloride: 100 mEq/L (ref 96–112)
Chloride: 100 mEq/L (ref 96–112)
GFR calc Af Amer: 85 mL/min — ABNORMAL LOW (ref >90)
GFR calc Af Amer: >90 mL/min (ref >90)
GFR calc non Af Amer: 73 mL/min — ABNORMAL LOW (ref >90)
GFR calc non Af Amer: 89 mL/min — ABNORMAL LOW (ref >90)
Glucose, Bld: 101 mg/dL — ABNORMAL HIGH (ref 70–99)
Glucose, Bld: 96 mg/dL (ref 70–99)
Potassium: 3.3 mEq/L — ABNORMAL LOW (ref 3.5–5.1)
Potassium: 3.7 mEq/L (ref 3.5–5.1)
Sodium: 135 mEq/L (ref 135–145)
Sodium: 131 mEq/L — ABNORMAL LOW (ref 135–145)
Total Bilirubin: 1.1 mg/dL (ref 0.3–1.2)
Total Bilirubin: 1 mg/dL (ref 0.3–1.2)
Total Protein: 6.3 g/dL (ref 6.0–8.3)

## 2012-11-03 LAB — URINALYSIS, ROUTINE W REFLEX MICROSCOPIC
Ketones, ur: NEGATIVE mg/dL
Leukocytes, UA: NEGATIVE
Nitrite: NEGATIVE
Specific Gravity, Urine: 1.03 (ref 1.005–1.030)
pH: 6 (ref 5.0–8.0)

## 2012-11-03 LAB — CBC WITH DIFFERENTIAL/PLATELET
Basophils Absolute: 0 10*3/uL (ref 0.0–0.1)
Eosinophils Absolute: 0 10*3/uL (ref 0.0–0.7)
Eosinophils Relative: 0 % (ref 0–5)
Eosinophils Relative: 0 % (ref 0–5)
HCT: 25.1 % — ABNORMAL LOW (ref 36.0–46.0)
Lymphocytes Relative: 4 % — ABNORMAL LOW (ref 12–46)
Lymphocytes Relative: 7 % — ABNORMAL LOW (ref 12–46)
Lymphs Abs: 0.5 10*3/uL — ABNORMAL LOW (ref 0.7–4.0)
MCH: 30.9 pg (ref 26.0–34.0)
MCV: 93.1 fL (ref 78.0–100.0)
MCV: 93.3 fL (ref 78.0–100.0)
Monocytes Absolute: 1.1 10*3/uL — ABNORMAL HIGH (ref 0.1–1.0)
Neutro Abs: 9.4 10*3/uL — ABNORMAL HIGH (ref 1.7–7.7)
Neutrophils Relative %: 85 % — ABNORMAL HIGH (ref 43–77)
Platelets: 80 10*3/uL — ABNORMAL LOW (ref 150–400)
RBC: 3.05 MIL/uL — ABNORMAL LOW (ref 3.87–5.11)
RDW: 15.7 % — ABNORMAL HIGH (ref 11.5–15.5)
WBC: 11.1 10*3/uL — ABNORMAL HIGH (ref 4.0–10.5)
WBC: 9.3 10*3/uL (ref 4.0–10.5)

## 2012-11-03 LAB — LACTIC ACID, PLASMA: Lactic Acid, Venous: 1.1 mmol/L (ref 0.5–2.2)

## 2012-11-03 LAB — APTT: aPTT: 35 seconds (ref 24–37)

## 2012-11-03 LAB — PROTIME-INR: INR: 1.29 (ref 0.00–1.49)

## 2012-11-03 MED ORDER — VITAMIN-B COMPLEX PO TABS: ORAL_TABLET | Freq: Every morning | ORAL | Status: DC

## 2012-11-03 MED ORDER — METRONIDAZOLE IN NACL 5-0.79 MG/ML-% IV SOLN
500.0000 mg | Freq: Once | INTRAVENOUS | Status: AC
  Administered 2012-11-03: 500 mg via INTRAVENOUS
  Filled 2012-11-03: qty 100

## 2012-11-03 MED ORDER — MIRTAZAPINE 15 MG PO TABS
15.0000 mg | ORAL_TABLET | Freq: Every day | ORAL | Status: DC
  Administered 2012-11-03 – 2012-11-10 (×8): 15 mg via ORAL
  Filled 2012-11-03 (×9): qty 1

## 2012-11-03 MED ORDER — MORPHINE SULFATE 15 MG PO TABS: 15.0000 mg | ORAL_TABLET | Freq: Four times a day (QID) | ORAL | Status: DC | PRN

## 2012-11-03 MED ORDER — POTASSIUM CHLORIDE CRYS ER 20 MEQ PO TBCR
40.0000 meq | EXTENDED_RELEASE_TABLET | Freq: Once | ORAL | Status: AC
  Administered 2012-11-03: 40 meq via ORAL
  Filled 2012-11-03: qty 2

## 2012-11-03 MED ORDER — GABAPENTIN 300 MG PO CAPS
300.0000 mg | ORAL_CAPSULE | Freq: Three times a day (TID) | ORAL | Status: DC
  Administered 2012-11-03 – 2012-11-11 (×23): 300 mg via ORAL
  Filled 2012-11-03 (×25): qty 1

## 2012-11-03 MED ORDER — DOCUSATE SODIUM 100 MG PO CAPS
200.0000 mg | ORAL_CAPSULE | Freq: Every day | ORAL | Status: DC
  Administered 2012-11-03 – 2012-11-08 (×6): 200 mg via ORAL
  Filled 2012-11-03 (×7): qty 2

## 2012-11-03 MED ORDER — IOHEXOL 300 MG/ML  SOLN: 50.0000 mL | Freq: Once | INTRAMUSCULAR | Status: DC | PRN

## 2012-11-03 MED ORDER — B COMPLEX-C PO TABS
1.0000 | ORAL_TABLET | Freq: Every day | ORAL | Status: DC
  Administered 2012-11-03 – 2012-11-11 (×9): 1 via ORAL
  Filled 2012-11-03 (×9): qty 1

## 2012-11-03 MED ORDER — ACETAMINOPHEN 325 MG PO TABS
650.0000 mg | ORAL_TABLET | Freq: Four times a day (QID) | ORAL | Status: DC | PRN
  Administered 2012-11-10 (×2): 650 mg via ORAL
  Filled 2012-11-03 (×2): qty 2

## 2012-11-03 MED ORDER — VANCOMYCIN HCL IN DEXTROSE 1-5 GM/200ML-% IV SOLN
1000.0000 mg | Freq: Once | INTRAVENOUS | Status: AC
  Administered 2012-11-03: 1000 mg via INTRAVENOUS
  Filled 2012-11-03: qty 200

## 2012-11-03 MED ORDER — PANTOPRAZOLE SODIUM 40 MG PO TBEC
40.0000 mg | DELAYED_RELEASE_TABLET | Freq: Every day | ORAL | Status: DC
  Administered 2012-11-04 – 2012-11-09 (×6): 40 mg via ORAL
  Filled 2012-11-03 (×6): qty 1

## 2012-11-03 MED ORDER — LORAZEPAM 0.5 MG PO TABS
0.5000 mg | ORAL_TABLET | Freq: Three times a day (TID) | ORAL | Status: DC | PRN
  Administered 2012-11-08 – 2012-11-09 (×2): 0.5 mg via ORAL
  Filled 2012-11-03 (×2): qty 1

## 2012-11-03 MED ORDER — DEXTROSE 5 % IV SOLN
1.0000 g | Freq: Three times a day (TID) | INTRAVENOUS | Status: DC
  Administered 2012-11-03 – 2012-11-05 (×5): 1 g via INTRAVENOUS
  Filled 2012-11-03 (×8): qty 1

## 2012-11-03 MED ORDER — SODIUM CHLORIDE 0.9 % IV SOLN
INTRAVENOUS | Status: DC
  Administered 2012-11-05: 06:00:00 via INTRAVENOUS

## 2012-11-03 MED ORDER — ONDANSETRON HCL 4 MG/2ML IJ SOLN
4.0000 mg | Freq: Four times a day (QID) | INTRAMUSCULAR | Status: DC | PRN
  Administered 2012-11-04 – 2012-11-05 (×2): 4 mg via INTRAVENOUS
  Filled 2012-11-03 (×2): qty 2

## 2012-11-03 MED ORDER — IOHEXOL 300 MG/ML  SOLN
50.0000 mL | Freq: Once | INTRAMUSCULAR | Status: AC | PRN
  Administered 2012-11-03: 50 mL via ORAL

## 2012-11-03 MED ORDER — METRONIDAZOLE IN NACL 5-0.79 MG/ML-% IV SOLN
500.0000 mg | Freq: Three times a day (TID) | INTRAVENOUS | Status: DC
  Administered 2012-11-04 – 2012-11-06 (×8): 500 mg via INTRAVENOUS
  Filled 2012-11-03 (×9): qty 100

## 2012-11-03 MED ORDER — HYDROMORPHONE HCL PF 1 MG/ML IJ SOLN
1.0000 mg | INTRAMUSCULAR | Status: DC | PRN
  Administered 2012-11-03 – 2012-11-05 (×3): 1 mg via INTRAVENOUS
  Filled 2012-11-03 (×3): qty 1

## 2012-11-03 MED ORDER — DEXTROSE 5 % IV SOLN
1.0000 g | Freq: Once | INTRAVENOUS | Status: AC
  Administered 2012-11-03: 1 g via INTRAVENOUS
  Filled 2012-11-03: qty 1

## 2012-11-03 MED ORDER — SODIUM CHLORIDE 0.9 % IV BOLUS (SEPSIS)
1000.0000 mL | Freq: Once | INTRAVENOUS | Status: AC
  Administered 2012-11-03: 1000 mL via INTRAVENOUS

## 2012-11-03 MED ORDER — POTASSIUM CHLORIDE CRYS ER 20 MEQ PO TBCR: 40.0000 meq | EXTENDED_RELEASE_TABLET | Freq: Once | ORAL | Status: DC

## 2012-11-03 MED ORDER — ALPHA LIPOIC ACID 200 MG PO CAPS: 200.0000 mg | ORAL_CAPSULE | Freq: Every day | ORAL | Status: DC

## 2012-11-03 MED ORDER — ONDANSETRON HCL 4 MG/2ML IJ SOLN
4.0000 mg | Freq: Once | INTRAMUSCULAR | Status: AC
  Administered 2012-11-03: 4 mg via INTRAVENOUS
  Filled 2012-11-03: qty 2

## 2012-11-03 MED ORDER — ACETAMINOPHEN 650 MG RE SUPP: 650.0000 mg | Freq: Four times a day (QID) | RECTAL | Status: DC | PRN

## 2012-11-03 MED ORDER — METOPROLOL SUCCINATE ER 100 MG PO TB24
100.0000 mg | ORAL_TABLET | Freq: Every morning | ORAL | Status: DC
  Administered 2012-11-04 – 2012-11-11 (×8): 100 mg via ORAL
  Filled 2012-11-03 (×8): qty 1

## 2012-11-03 MED ORDER — ONDANSETRON HCL 4 MG PO TABS: 4.0000 mg | ORAL_TABLET | Freq: Four times a day (QID) | ORAL | Status: DC | PRN

## 2012-11-03 MED ORDER — IOHEXOL 350 MG/ML SOLN
100.0000 mL | Freq: Once | INTRAVENOUS | Status: AC | PRN
  Administered 2012-11-03: 100 mL via INTRAVENOUS

## 2012-11-03 MED ORDER — POLYETHYLENE GLYCOL 3350 17 G PO PACK
17.0000 g | PACK | Freq: Every day | ORAL | Status: DC
  Administered 2012-11-03 – 2012-11-07 (×5): 17 g via ORAL
  Filled 2012-11-03 (×7): qty 1

## 2012-11-03 MED ORDER — VANCOMYCIN HCL 1000 MG IV SOLR
750.0000 mg | Freq: Two times a day (BID) | INTRAVENOUS | Status: DC
  Administered 2012-11-04 – 2012-11-05 (×3): 750 mg via INTRAVENOUS
  Filled 2012-11-03 (×5): qty 750

## 2012-11-03 MED ORDER — MORPHINE SULFATE 4 MG/ML IJ SOLN
6.0000 mg | Freq: Once | INTRAMUSCULAR | Status: AC
  Administered 2012-11-03: 6 mg via INTRAVENOUS
  Filled 2012-11-03: qty 2

## 2012-11-03 NOTE — Progress Notes (Signed)
PHARMACIST - PHYSICIAN ORDER COMMUNICATION  CONCERNING: P&T Medication Policy on Herbal Medications  DESCRIPTION:  This patient's order for Alpha Lipoic Acid has been noted.  This product(s) is classified as an "herbal" or natural product. Due to a lack of definitive safety studies or FDA approval, nonstandard manufacturing practices, plus the potential risk of unknown drug-drug interactions while on inpatient medications, the Pharmacy and Therapeutics Committee does not permit the use of "herbal" or natural products of this type within [facility].   ACTION TAKEN: The pharmacy department is unable to verify this order at this time and your patient has been informed of this safety policy. Please reevaluate patient's clinical condition at discharge and address if the herbal or natural product(s) should be resumed at that time.  Thank you, Terrilee Files, PharmD

## 2012-11-03 NOTE — ED Notes (Signed)
Pt c/o abdominal pain, headache, and shortness of breath that started this morning.

## 2012-11-03 NOTE — Progress Notes (Addendum)
ANTIBIOTIC CONSULT NOTE - INITIAL  Pharmacy Consult for Cefepime  Indication:  Fever  Allergies  Allergen Reactions  . Promethazine Hcl Anxiety and Other (See Comments)    Change in LOC  . Sulfonamide Derivatives Hives    All over the body    Patient Measurements:   Vital Signs: Temp: 100.1 [name] (37.8 C) (03/25 1405) Temp src: Oral (03/25 1405) BP: 157/80 mmHg (03/25 1452) Pulse Rate: 79 (03/25 1452) Intake/Output from previous day:   Intake/Output from this shift:    Labs: No results found for this basename: WBC, HGB, PLT, LABCREA, CREATININE,  in the last 72 hours The CrCl is unknown because both a height and weight (above a minimum accepted value) are required for this calculation. No results found for this basename: VANCOTROUGH, VANCOPEAK, VANCORANDOM, GENTTROUGH, GENTPEAK, GENTRANDOM, TOBRATROUGH, TOBRAPEAK, TOBRARND, AMIKACINPEAK, AMIKACINTROU, AMIKACIN,  in the last 72 hours   Microbiology: No results found for this or any previous visit (from the past 720 hour(s)).  Medical History: Past Medical History  Diagnosis Date  . Hypertension   . Hyperlipidemia   . GERD (gastroesophageal reflux disease)   . Depression   . Esophageal stricture 2003     [place] post esophageal dilation in 2003 performed by Dr. Marina Goodell, second esophageal dilation performed in 2006  by Dr. Marina Goodell  . Obstructive jaundice due to malignant neoplasm  February 2012     status post ERCP done by Dr. Marina Goodell with biliary sphincterotomy, bile duct stricture brushings, biliary stent placement, performed secondary to obstructive jaundice, malignant-appearing bile duct stricture of the common hepatic duct worrisome for primary cholangiocarcinoma, status post ERCP with biliary sphincterotomy and biliary stent placement...status post biopsy -  markedly atypical cells cons  . Rotator cuff syndrome 2010     per MRI March 2010 there is a prominent articular surface partial tear of the supraspinatus which is nearly  full thickness, as well as a tear of the distal anterior supraspinatus which probably represents a full-thickness partial tear,  with moderate supraspinatus tendinopathy, moderate infraspinatus tendinopathy and prominent undersurface tear of the acromion along the coracoacromial ligament  . Hiatal hernia   . SOB (shortness of breath) on exertion   . Bile duct adenocarcinoma   . Myocardial infarction   . Angina   . Headache   . Arthritis   . Dysrhythmia   . Peripheral vascular disease   . Anemia   . Diarrhea   . Nausea   . Cholangiocarcinoma   . Complication of anesthesia   . Pneumonia 08/13/2012    Medications:  Scheduled:  . [COMPLETED]  morphine injection  6 mg Intravenous Once  . [COMPLETED] ondansetron (ZOFRAN) IV  4 mg Intravenous Once   Infusions:  . ceFEPime (MAXIPIME) IV    . metronidazole    . [COMPLETED] sodium chloride 1,000 mL (11/03/12 1451)  . vancomycin     PRN:  Assessment:  62 yo [name] with metastatic cholangiocarcinoma, metastasis to lungs, presents to ER with generalized weakness, fatigue, abdominal pain and emesis, and SOB  Patient received flagyl and vancomycin x 1 dose in ER, Pharmacy asked to dose Cefepime/Vancomycin.   Scr WNL, with CrCl 67 ml/min   WBC 11.1  Goal of Therapy:  Cefepime per renal function   Plan:  1.) Cefepime 1 gram IV q8h 2.) Vancomycin 750 mg IV q12h 3.) Monitor renal function 4.) Check vancomycin levels as neede  BorgerdingLoma Messing PharmD Pager #: [phone] 6:35 PM 11/03/2012

## 2012-11-03 NOTE — H&P (Signed)
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 Patient also reported subjective fevers but no chills. Patient rates her pain as 10/10 in intensity, constant and non radiating. She used home analgesics but with no significant symptomatic improvement. In addition, patient also reported shortness of breath over past few days but no associated cough. No reports of blood in stool or urine. No loss of consciousness. In ED, evaluation included CT abdomen/pelvis which revealed persistent but smaller subdiaphragmatic abscess on the right just posterior to the liver, 5 x 5 cm.  CT angio chest ruled out pulmonary embolism but it did show progressive pulmonary metastatic disease and stable right pleural effusion. CBC revealed mild leukocytosis of 11.1, hemoglobin of 9.6 (which is patient's baseline) and platelet count of 80 (which is lower than patient's baseline with normal platelet count). Patient was started on vanco, cefepime and flagyl in ED.  Assessment and Plan:  Principal Problem:   Fever of unknown origin  Perhaps fever is coming from small liver abscess noted on CT abdomen. No obvious source of infection in lungs and urinalysis is unremarkable. Lactic acid is WNL. Check procalcitonin level.  Considering this patient is immunocompromised I think it is reasonable to continue all  the antibiotics started in ED (vanco, cefepime and flagyl) until we get the results of blood cultures and further repeat of admission labs. 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 Per oncology she was not a further candidate for chemotherapy.   Leukocytosis  Secondary to possible liver abscess  Monitor CBC   Anemia of chronic disease  Secondary to history of malignancy  Hemoglobin on this admission is around patient's baseline of 9.5   Thrombocytopenia  Secondary to sequela of chemotherapy  No signs of bleed  Follow up platelet count   Hypokalemia  repleted  Secondary to GI losses  Follow up BMP in am   Severe protein calorie malnutrition  Secondary to history of cholangiocarcinoma  Nutrition consulted  Code Status: Full Family Communication: Pt at bedside Disposition Plan: Admit for further evaluation  Antibiotics:  Vanco 3/25 -->  Cefepime 3/25 -->  Flagyl 3/25 -->  Manson Passey, MD  [facility] Pager [phone]  If 7PM-7AM, please contact night-coverage [email or web address] Password [facility] 11/03/2012, 6:23 PM   Review of Systems:  Constitutional: positive for fever, chills and malaise/fatigue. Negative for diaphoresis.  HENT: Negative for hearing loss, ear pain, nosebleeds, congestion, sore throat, neck pain, tinnitus and ear discharge Eyes: Negative for blurred vision, double vision, photophobia, pain, discharge and redness.  Respiratory: Negative for cough, hemoptysis, sputum production, shortness of breath, wheezing and stridor.   Cardiovascular: Negative for chest pain, palpitations, orthopnea, claudication and leg swelling.  Gastrointestinal: positive  for nausea and vomiting and abdominal pain. Also poor oral  intake. Genitourinary: Negative for dysuria, urgency, frequency, hematuria and flank pain.  Musculoskeletal: Negative for myalgias, back pain, joint pain and falls.  Skin: Negative for itching and rash.  Neurological: Negative for dizziness and weakness. Negative for tingling, tremors, sensory change, speech change, focal weakness, loss of consciousness and headaches.  Endo/Heme/Allergies: Negative for environmental allergies and polydipsia. Does not bruise/bleed easily.  Psychiatric/Behavioral: Negative for suicidal ideas. The patient is not nervous/anxious.      Past Medical History  Diagnosis Date  . Hypertension   . Hyperlipidemia   . GERD (gastroesophageal reflux disease)   . Depression   . Esophageal stricture 2003     [place] post esophageal dilation in 2003 performed by Dr. Marina Goodell, second esophageal dilation performed in 2006  by Dr. Marina Goodell  . Obstructive jaundice due to malignant neoplasm  February 2012     status post ERCP done by Dr. Marina Goodell with biliary sphincterotomy, bile duct stricture brushings, biliary stent placement, performed secondary to obstructive jaundice, malignant-appearing bile duct stricture of the common hepatic duct worrisome for primary cholangiocarcinoma, status post ERCP with biliary sphincterotomy and biliary stent placement...status post biopsy -  markedly atypical cells cons  . Rotator cuff syndrome 2010     per MRI March 2010 there is a prominent articular surface partial tear of the supraspinatus which is nearly full thickness, as well as a tear of the distal anterior supraspinatus which probably represents a full-thickness partial tear,  with moderate supraspinatus tendinopathy, moderate infraspinatus tendinopathy and prominent undersurface tear of the acromion along the coracoacromial ligament  . Hiatal hernia   . SOB (shortness of breath) on exertion   . Bile duct adenocarcinoma   . Myocardial infarction   . Angina   . Headache   . Arthritis   .  Dysrhythmia   . Peripheral vascular disease   . Anemia   . Diarrhea   . Nausea   . Cholangiocarcinoma   . Complication of anesthesia   . Pneumonia 08/13/2012   Past Surgical History  Procedure Laterality Date  . Cholecystectomy   2007  .  aortogram    August 2011     ultrasound-guided access to the right common femoral artery, an aortogram with bilateral iliac arteriogram and bilateral lower extremity runoff, this was performed secondary to progressive bilateral lower extremity claudication, left common iliac artery occlusion as well as short segment of right superficial femoral artery occlusion, procedure performed by Dr. Durwin Nora  . Bypass graft   January 2009     of the infected femoral-femoral graft with vein patch angioplasty of bilateral femoral artery, 7 by [name],   .  abdominal aortic angiography   September 2008     performed by Dr. 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 Cardiac catheterization   August 2011     patent coronary arteries with diffuse nonobstructive disease, there are no areas of high grade stenosis present,  determined the chest pain is most likely noncardiac in origin, continue medical management.  . Bile duct surgery      60% of liver removed  . Arthroscopy knee w/ drilling    . Esophagogastroduodenoscopy  07/10/2011    Procedure: ESOPHAGOGASTRODUODENOSCOPY (EGD);  Surgeon: Yancey Flemings, MD;  Location: [facility];  Service: Endoscopy;  Laterality: N/A;  .  Liver resection      section of liver resceted due to bile duct ca  . Esophagogastroduodenoscopy  07/20/2011    Procedure: ESOPHAGOGASTRODUODENOSCOPY (EGD);  Surgeon: Erick Blinks, MD;  Location: [facility];  Service: Gastroenterology;  Laterality: N/A;  . Esophagogastroduodenoscopy  07/31/2011    Procedure:  ESOPHAGOGASTRODUODENOSCOPY (EGD);  Surgeon: Yancey Flemings, MD;  Location: [facility];  Service: Endoscopy;  Laterality: N/A;  . Mass excision  03/05/2012    Procedure: EXCISION MASS;  Surgeon: Adolph Pollack, MD;  Location: [facility] OR;  Service: General;  Laterality: N/A;  Right chest wall   Social History:  reports that she quit smoking about 13 years ago. She has never used smokeless tobacco. She reports that she does not drink alcohol or use illicit drugs.  Allergies  Allergen Reactions  . Promethazine Hcl Anxiety and Other (See Comments)    Change in LOC  . Sulfonamide Derivatives Hives    All over the body    Family History:  Family History  Problem Relation Age of Onset  . Heart disease Brother      Prior to Admission medications   Medication Sig Start Date End Date Taking? Authorizing Provider  Alpha Lipoic Acid 200 MG CAPS Take 200 mg by mouth daily.   Yes Historical Provider, MD  B Complex Vitamins (VITAMIN-B COMPLEX) TABS Take by mouth every morning.   Yes Historical Provider, MD  docusate sodium (COLACE) 100 MG capsule Take 200 mg by mouth daily.   Yes Historical Provider, MD  gabapentin (NEURONTIN) 300 MG capsule Take 300 mg by mouth 3 (three) times daily.   Yes Historical Provider, MD  lidocaine-prilocaine (EMLA) cream Apply 1 application topically daily as needed (apply to port site).   Yes Historical Provider, MD  LORazepam (ATIVAN) 0.5 MG tablet Take 0.5 mg by mouth every 8 (eight) hours as needed (for nausea).   Yes Historical Provider, MD  metoprolol succinate (TOPROL-XL) 100 MG 24 hr tablet Take 100 mg by mouth every morning. Take with or immediately following a meal.   Yes Historical Provider, MD  mirtazapine (REMERON) 15 MG tablet Take 15 mg by mouth at bedtime.   Yes Historical Provider, MD  morphine (MSIR) 15 MG tablet Take 1 tablet (15 mg total) by mouth every 6 (six) hours as needed for pain. 09/18/12  Yes Josph Macho, MD  omeprazole (PRILOSEC) 20 MG capsule  Take 1 capsule (20 mg total) by mouth daily. 10/26/12  Yes Judie Bonus, MD  polyethylene glycol [facility] / GLYCOLAX) packet Take 17 g by mouth daily.   Yes Historical Provider, MD   Physical Exam: Filed Vitals:   11/03/12 1405 11/03/12 1452 11/03/12 1625  BP: 150/86 157/80 155/81  Pulse: 86 79   Temp: 100.1 F (37.8 C)    TempSrc: Oral    Resp: 22 20 21   SpO2: 100% 99%     Physical Exam  Constitutional: Appears ill, patient is in no distress.  HENT: Normocephalic. External right and left ear normal. Oropharynx is clear and moist.  Eyes: Conjunctivae and EOM are normal. PERRLA, no scleral icterus.  Neck: Normal ROM. Neck supple. No JVD. No tracheal deviation. No thyromegaly.  CVS: RRR, S1/S2 +, no murmurs, no gallops, no carotid bruit.  Pulmonary: Effort and breath sounds normal, no stridor, rhonchi, wheezes, rales.  Abdominal: Soft. BS +,  Tender in mid abdomen to palpation with no rebound or guarding.  Musculoskeletal: Normal range of motion. No edema and no tenderness.  Lymphadenopathy: No  lymphadenopathy noted, cervical, inguinal. Neuro: Alert. Normal reflexes, muscle tone coordination. No cranial nerve deficit. Skin: Skin is warm and dry. No rash noted. Not diaphoretic. No erythema. No pallor.  Psychiatric: Normal mood and affect. Behavior, judgment, thought content normal.   Labs on Admission:  Basic Metabolic Panel:  Recent Labs Lab 11/03/12 1500  NA 135  K 3.3*  CL 100  CO2 25  GLUCOSE 101*  BUN 15  CREATININE 0.85  CALCIUM 9.1   Liver Function Tests:  Recent Labs Lab 11/03/12 1500  AST 43*  ALT 34  ALKPHOS 133*  BILITOT 1.1  PROT 7.2  ALBUMIN 2.8*   No results found for this basename: LIPASE, AMYLASE,  in the last 168 hours No results found for this basename: AMMONIA,  in the last 168 hours CBC:  Recent Labs Lab 11/03/12 1500  WBC 11.1*  NEUTROABS 9.4*  HGB 9.6*  HCT 28.4*  MCV 93.1  PLT 80*   Cardiac Enzymes: No results found for  this basename: CKTOTAL, CKMB, CKMBINDEX, TROPONINI,  in the last 168 hours BNP: No components found with this basename: POCBNP,  CBG: No results found for this basename: GLUCAP,  in the last 168 hours  Radiological Exams on Admission: Ct Angio Chest Pe W/cm &/or Wo Cm 11/03/2012  *  IMPRESSION:  1.  No CT findings for pulmonary embolism. 2.  Mild fusiform enlargement of the ascending aorta.  No focal aneurysm or dissection. 3.  Enlargement of the main pulmonary artery suggesting pulmonary hypertension. 4.  Extensive coronary artery calcifications, advanced for age. 5.  Progressive pulmonary metastatic disease. 6.  Stable right pleural effusion with overlying atelectasis.  CT ABDOMEN AND PELVIS  Findings: There is a persistent but smaller subdiaphragmatic abscess on the right just posterior to the liver.  This measures 5 x 5 cm.  Other smaller fluid collection bilaterally is also smaller.  Stable surgical changes involving the liver.  Persistent ill-defined low attenuation soft tissue surrounding the portal vein and portal structures with numerous surgical clips.  The portal vein is patent.  The splenic veins patent.  The spleen is mildly enlarged but stable.  No obvious pancreatic mass but the pancreatic head is not well visualized due to persistent edema.  The left adrenal gland mass is stable.  A small right adrenal gland lesion is unchanged.  Both kidneys are unremarkable.  The stomach, duodenum and small bowel are grossly normal.  The colon is unremarkable.  Moderate stool throughout the colon may suggest constipation.  The appendix is normal.  Advanced atherosclerotic changes involving the aorta but no focal aneurysm or dissection.  No mesenteric or retroperitoneal lymphadenopathy.  The uterus and ovaries are stable.  Calcifications are noted on the left.  The bladder is unremarkable.  No pelvic mass, adenopathy or significant free pelvic fluid collections.  No significant bony findings.  Review of the  MIP images confirms the above findings.  IMPRESSION:  1. Slight decrease in size of the subdiaphragmatic fluid collection with air, likely a contracting abscess. 2.  Stable surgical changes involving the liver with ill-defined low attenuation material surrounding the portal vein and portal structures. 3.  No other significant abdominal/pelvic findings.     Ct Abdomen Pelvis W Contrast 11/03/2012 IMPRESSION:  1.  No CT findings for pulmonary embolism. 2.  Mild fusiform enlargement of the ascending aorta.  No focal aneurysm or dissection. 3.  Enlargement of the main pulmonary artery suggesting pulmonary hypertension. 4.  Extensive coronary artery calcifications, advanced for  age. 5.  Progressive pulmonary metastatic disease. 6.  Stable right pleural effusion with overlying atelectasis.  CT ABDOMEN AND PELVIS  Findings: There is a persistent but smaller subdiaphragmatic abscess on the right just posterior to the liver.  This measures 5 x 5 cm.  Other smaller fluid collection bilaterally is also smaller.  Stable surgical changes involving the liver.  Persistent ill-defined low attenuation soft tissue surrounding the portal vein and portal structures with numerous surgical clips.  The portal vein is patent.  The splenic veins patent.  The spleen is mildly enlarged but stable.  No obvious pancreatic mass but the pancreatic head is not well visualized due to persistent edema.  The left adrenal gland mass is stable.  A small right adrenal gland lesion is unchanged.  Both kidneys are unremarkable.  The stomach, duodenum and small bowel are grossly normal.  The colon is unremarkable.  Moderate stool throughout the colon may suggest constipation.  The appendix is normal.  Advanced atherosclerotic changes involving the aorta but no focal aneurysm or dissection.  No mesenteric or retroperitoneal lymphadenopathy.  The uterus and ovaries are stable.  Calcifications are noted on the left.  The bladder is unremarkable.  No pelvic  mass, adenopathy or significant free pelvic fluid collections.  No significant bony findings.  Review of the MIP images confirms the above findings.  IMPRESSION:  1. Slight decrease in size of the subdiaphragmatic fluid collection with air, likely a contracting abscess. 2.  Stable surgical changes involving the liver with ill-defined low attenuation material surrounding the portal vein and portal structures. 3.  No other significant abdominal/pelvic findings.    Dg Abd Acute W/chest 11/03/2012   IMPRESSION: Scattered small pulmonary nodules/opacities, corresponding to metastases on CT.  Small right pleural effusion.  Lucency beneath the medial right lung base, favored to reflect gas within a subdiaphragmatic abscess when correlating with CT.  No evidence of bowel obstruction or free air.   Original Report Authenticated By: Charline Bills, M.D.     EKG: Normal sinus rhythm, no ST/T wave changes  Time spent: 110 minutes

## 2012-11-03 NOTE — ED Provider Notes (Signed)
History     CSN: [id number]  Arrival date & time 11/03/12  1315   First MD Initiated Contact with Patient 11/03/12 1336      Chief Complaint  Patient presents with  . Abdominal Pain  . Shortness of Breath    (Consider location/radiation/quality/duration/timing/severity/associated sxs/prior treatment) HPI Comments: [name] is a 62 y.o. 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  Fatigue and weakness has been gradually worsening over the last couple of weeks while abdominal pain and emesis began acutely last night.  Patient reports that she's been unable to keep down any liquids or solids since onset.  Pain is located primarily in the epigastric and periumbilical region and described as constant sharp pain without radiation.  Severity 10/10.  Patient reports that she does suffer from chronic abdominal pain since her cancer diagnosis 2 years ago however this is much worse.  Surgical history of hepatic resection 2 years ago with 1 session of radiation following then continuous chemotherapy. LNBM 3 days ago. Last Chemo in Feb. Patient denies any fevers, night sweats, chills, diarrhea, chest pain, leg swelling, cough, hemoptysis, dysuria.     The history is provided by the patient.    Past Medical History  Diagnosis Date  . Hypertension   . Hyperlipidemia   . GERD (gastroesophageal reflux disease)   . Depression   . Esophageal stricture 2003     [place] post esophageal dilation in 2003 performed by Dr. Marina Goodell, second esophageal dilation performed in 2006  by Dr. Marina Goodell  . Obstructive jaundice due to malignant neoplasm  February 2012     status post ERCP done by Dr. Marina Goodell with biliary sphincterotomy, bile duct stricture brushings, biliary stent placement, performed secondary to obstructive jaundice, malignant-appearing bile duct  stricture of the common hepatic duct worrisome for primary cholangiocarcinoma, status post ERCP with biliary sphincterotomy and biliary stent placement...status post biopsy -  markedly atypical cells cons  . Rotator cuff syndrome 2010     per MRI March 2010 there is a prominent articular surface partial tear of the supraspinatus which is nearly full thickness, as well as a tear of the distal anterior supraspinatus which probably represents a full-thickness partial tear,  with moderate supraspinatus tendinopathy, moderate infraspinatus tendinopathy and prominent undersurface tear of the acromion along the coracoacromial ligament  . Hiatal hernia   . SOB (shortness of breath) on exertion   . Bile duct adenocarcinoma   . Myocardial infarction   . Angina   . Headache   . Arthritis   . Dysrhythmia   . Peripheral vascular disease   . Anemia   . Diarrhea   . Nausea   . Cholangiocarcinoma   . Complication of anesthesia   . Pneumonia 08/13/2012    Past Surgical History  Procedure Laterality Date  . Cholecystectomy   2007  .  aortogram    August 2011     ultrasound-guided access to the right common femoral artery, an aortogram with bilateral iliac arteriogram and bilateral lower extremity runoff, this was performed secondary to progressive bilateral lower extremity claudication, left common iliac artery occlusion as well as short segment of right superficial femoral artery occlusion, procedure performed by Dr. Durwin Nora  . Bypass graft   January 2009     of the infected femoral-femoral graft with vein patch angioplasty of bilateral femoral artery, 7 by [name],   .  abdominal aortic angiography   September 2008     performed by Dr. 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 Cardiac catheterization   August 2011      patent coronary arteries with diffuse nonobstructive disease, there are no areas of high grade stenosis present,  determined the chest pain is most likely noncardiac in origin, continue medical management.  . Bile duct surgery      60% of liver removed  . Arthroscopy knee w/ drilling    . Esophagogastroduodenoscopy  07/10/2011    Procedure: ESOPHAGOGASTRODUODENOSCOPY (EGD);  Surgeon: Yancey Flemings, MD;  Location: [facility];  Service: Endoscopy;  Laterality: N/A;  . Liver resection      section of liver resceted due to bile duct ca  . Esophagogastroduodenoscopy  07/20/2011    Procedure: ESOPHAGOGASTRODUODENOSCOPY (EGD);  Surgeon: Erick Blinks, MD;  Location: [facility];  Service: Gastroenterology;  Laterality: N/A;  . Esophagogastroduodenoscopy  07/31/2011    Procedure: ESOPHAGOGASTRODUODENOSCOPY (EGD);  Surgeon: Yancey Flemings, MD;  Location: [facility];  Service: Endoscopy;  Laterality: N/A;  . Mass excision  03/05/2012    Procedure: EXCISION MASS;  Surgeon: Adolph Pollack, MD;  Location: [facility] OR;  Service: General;  Laterality: N/A;  Right chest wall    Family History  Problem Relation Age of Onset  . Heart disease Brother     History  Substance Use Topics  . Smoking status: Former Smoker    Quit date: 06/14/1999  . Smokeless tobacco: Never Used  . Alcohol Use: No    OB History   Grav Para Term Preterm Abortions TAB SAB Ect Mult Living                  Review of Systems  All other systems reviewed and are negative.    Allergies  Promethazine hcl and Sulfonamide derivatives  Home Medications   Current Outpatient Rx  Name  Route  Sig  Dispense  Refill  . Alpha Lipoic Acid 200 MG CAPS   Oral   Take 200 mg by mouth daily.         . B Complex Vitamins (VITAMIN-B COMPLEX) TABS   Oral   Take by mouth every morning.         . docusate sodium (COLACE) 100 MG capsule   Oral   Take 200 mg by mouth daily.         Marland Kitchen gabapentin (NEURONTIN) 300 MG capsule   Oral    Take 300 mg by mouth 3 (three) times daily.         Marland Kitchen lidocaine-prilocaine (EMLA) cream   Topical   Apply 1 application topically daily as needed (apply to port site).         . LORazepam (ATIVAN) 0.5 MG tablet   Oral   Take 0.5 mg by mouth every 8 (eight) hours as needed (for nausea).         . metoprolol succinate (TOPROL-XL) 100 MG 24 hr tablet   Oral   Take 100 mg by mouth every morning. Take with or immediately following a meal.         . mirtazapine (REMERON) 15 MG tablet   Oral   Take 15 mg by mouth at bedtime.         Marland Kitchen morphine (MSIR) 15 MG tablet   Oral   Take  1 tablet (15 mg total) by mouth every 6 (six) hours as needed for pain.   90 tablet   0   . omeprazole (PRILOSEC) 20 MG capsule   Oral   Take 1 capsule (20 mg total) by mouth daily.   90 capsule   1   . polyethylene glycol (MIRALAX / GLYCOLAX) packet   Oral   Take 17 g by mouth daily.           BP 155/81  Pulse 79  Temp(Src) 100.1 F (37.8 C) (Oral)  Resp 21  SpO2 99%  Physical Exam  Nursing note and vitals reviewed. Constitutional: She is oriented to person, place, and time.  Cachectic, febrile, appears exhausted   HENT:  Head: Normocephalic and atraumatic.  Eyes: Conjunctivae and EOM are normal.  Neck: Normal range of motion.  Full range of motion, no nuchal rigidity  Cardiovascular: Regular rhythm, normal heart sounds and intact distal pulses.   Regular rate rhythm, intact distal pulses.  No pitting edema  Pulmonary/Chest: Effort normal.  LCAB, no resp distress, O2 sat 100% on RA  Abdominal:  Soft abdomen with periumbilical and epigastric tenderness to palpation.  No peritoneal signs.   surgical scars noted.  Musculoskeletal: Normal range of motion.  Neurological: She is alert and oriented to person, place, and time.  Generalized weakness.  Intact sensation.  Normal speech.  Skin: Skin is warm and dry. No rash noted. She is not diaphoretic.  Psychiatric: She has a normal  mood and affect. Her behavior is normal.    ED Course  Procedures (including critical care time)  Labs Reviewed  CBC WITH DIFFERENTIAL - Abnormal; Notable for the following:    WBC 11.1 (*)    RBC 3.05 (*)    Hemoglobin 9.6 (*)    HCT 28.4 (*)    RDW 15.9 (*)    Platelets 80 (*)    Neutrophils Relative 85 (*)    Neutro Abs 9.4 (*)    Lymphocytes Relative 4 (*)    Lymphs Abs 0.5 (*)    Monocytes Absolute 1.3 (*)    All other components within normal limits  COMPREHENSIVE METABOLIC PANEL - Abnormal; Notable for the following:    Potassium 3.3 (*)    Glucose, Bld 101 (*)    Albumin 2.8 (*)    AST 43 (*)    Alkaline Phosphatase 133 (*)    GFR calc non Af Amer 73 (*)    GFR calc Af Amer 85 (*)    All other components within normal limits  PROTIME-INR - Abnormal; Notable for the following:    Prothrombin Time 15.8 (*)    All other components within normal limits  URINALYSIS, ROUTINE W REFLEX MICROSCOPIC - Abnormal; Notable for the following:    Color, Urine AMBER (*)    Bilirubin Urine SMALL (*)    Urobilinogen, UA 2.0 (*)    All other components within normal limits  URINE CULTURE  CULTURE, BLOOD (ROUTINE X 2)  CULTURE, BLOOD (ROUTINE X 2)  APTT  LACTIC ACID, PLASMA  TYPE AND SCREEN   Ct Angio Chest Pe W/cm &/or Wo Cm  11/03/2012  *RADIOLOGY REPORT*  Clinical Data:  Chest pain, shortness of breath and abdominal pain. History of metastatic cholangiocarcinoma.  CT ANGIOGRAPHY CHEST CT ABDOMEN AND PELVIS WITH CONTRAST  Technique:  Multidetector CT imaging of the chest was performed using the standard protocol during bolus administration of intravenous contrast.  Multiplanar CT image reconstructions including MIPs were obtained  to evaluate the vascular anatomy. Multidetector CT imaging of the abdomen and pelvis was performed using the standard protocol during bolus administration of intravenous contrast.  Contrast: OMNIPAQUE IOHEXOL 350 MG/ML SOLN, 50mL OMNIPAQUE IOHEXOL 300  MG/ML  SOLN  Comparison:  CT abdomen 09/14/2012 and chest CT 09/30/2012.  CTA CHEST  Findings:  The chest wall is unremarkable.  A right-sided Port-A- Cath is noted.  No breast masses, supraclavicular or axillary lymphadenopathy.  The thyroid gland is normal.  The bony thorax is intact.  No destructive bone lesions or spinal canal compromise.  The heart is normal in size.  No pericardial effusion.  No mediastinal or hilar lymphadenopathy.  The esophagus is grossly normal.  The aorta is normal in caliber.  No dissection.  Extensive coronary artery calcifications are noted.  Examination of the pulmonary arterial tree demonstrates enlargement of the main pulmonary artery measuring up to 3.6 cm in diameter. This could be due to pulmonary hypertension.  The vessels well opacified and no filling defects are seen to suggest pulmonary emboli.  Examination of the lung parenchyma demonstrates extensive pulmonary metastatic disease.  Many of these lesions are cavitary.  Findings are progressive when compared the prior chest CT.  Persistent right pleural effusion with overlying atelectasis.   Review of the MIP images confirms the above findings.  IMPRESSION:  1.  No CT findings for pulmonary embolism. 2.  Mild fusiform enlargement of the ascending aorta.  No focal aneurysm or dissection. 3.  Enlargement of the main pulmonary artery suggesting pulmonary hypertension. 4.  Extensive coronary artery calcifications, advanced for age. 5.  Progressive pulmonary metastatic disease. 6.  Stable right pleural effusion with overlying atelectasis.  CT ABDOMEN AND PELVIS  Findings: There is a persistent but smaller subdiaphragmatic abscess on the right just posterior to the liver.  This measures 5 x 5 cm.  Other smaller fluid collection bilaterally is also smaller.  Stable surgical changes involving the liver.  Persistent ill-defined low attenuation soft tissue surrounding the portal vein and portal structures with numerous surgical clips.   The portal vein is patent.  The splenic veins patent.  The spleen is mildly enlarged but stable.  No obvious pancreatic mass but the pancreatic head is not well visualized due to persistent edema.  The left adrenal gland mass is stable.  A small right adrenal gland lesion is unchanged.  Both kidneys are unremarkable.  The stomach, duodenum and small bowel are grossly normal.  The colon is unremarkable.  Moderate stool throughout the colon may suggest constipation.  The appendix is normal.  Advanced atherosclerotic changes involving the aorta but no focal aneurysm or dissection.  No mesenteric or retroperitoneal lymphadenopathy.  The uterus and ovaries are stable.  Calcifications are noted on the left.  The bladder is unremarkable.  No pelvic mass, adenopathy or significant free pelvic fluid collections.  No significant bony findings.  Review of the MIP images confirms the above findings.  IMPRESSION:  1. Slight decrease in size of the subdiaphragmatic fluid collection with air, likely a contracting abscess. 2.  Stable surgical changes involving the liver with ill-defined low attenuation material surrounding the portal vein and portal structures. 3.  No other significant abdominal/pelvic findings.   Original Report Authenticated By: Rudie Meyer, M.D.    Ct Abdomen Pelvis W Contrast  11/03/2012  *RADIOLOGY REPORT*  Clinical Data:  Chest pain, shortness of breath and abdominal pain. History of metastatic cholangiocarcinoma.  CT ANGIOGRAPHY CHEST CT ABDOMEN AND PELVIS WITH CONTRAST  Technique:  Multidetector CT imaging of the chest was performed using the standard protocol during bolus administration of intravenous contrast.  Multiplanar CT image reconstructions including MIPs were obtained to evaluate the vascular anatomy. Multidetector CT imaging of the abdomen and pelvis was performed using the standard protocol during bolus administration of intravenous contrast.  Contrast: OMNIPAQUE IOHEXOL 350 MG/ML  SOLN, 50mL OMNIPAQUE IOHEXOL 300 MG/ML  SOLN  Comparison:  CT abdomen 09/14/2012 and chest CT 09/30/2012.  CTA CHEST  Findings:  The chest wall is unremarkable.  A right-sided Port-A- Cath is noted.  No breast masses, supraclavicular or axillary lymphadenopathy.  The thyroid gland is normal.  The bony thorax is intact.  No destructive bone lesions or spinal canal compromise.  The heart is normal in size.  No pericardial effusion.  No mediastinal or hilar lymphadenopathy.  The esophagus is grossly normal.  The aorta is normal in caliber.  No dissection.  Extensive coronary artery calcifications are noted.  Examination of the pulmonary arterial tree demonstrates enlargement of the main pulmonary artery measuring up to 3.6 cm in diameter. This could be due to pulmonary hypertension.  The vessels well opacified and no filling defects are seen to suggest pulmonary emboli.  Examination of the lung parenchyma demonstrates extensive pulmonary metastatic disease.  Many of these lesions are cavitary.  Findings are progressive when compared the prior chest CT.  Persistent right pleural effusion with overlying atelectasis.   Review of the MIP images confirms the above findings.  IMPRESSION:  1.  No CT findings for pulmonary embolism. 2.  Mild fusiform enlargement of the ascending aorta.  No focal aneurysm or dissection. 3.  Enlargement of the main pulmonary artery suggesting pulmonary hypertension. 4.  Extensive coronary artery calcifications, advanced for age. 5.  Progressive pulmonary metastatic disease. 6.  Stable right pleural effusion with overlying atelectasis.  CT ABDOMEN AND PELVIS  Findings: There is a persistent but smaller subdiaphragmatic abscess on the right just posterior to the liver.  This measures 5 x 5 cm.  Other smaller fluid collection bilaterally is also smaller.  Stable surgical changes involving the liver.  Persistent ill-defined low attenuation soft tissue surrounding the portal vein and portal  structures with numerous surgical clips.  The portal vein is patent.  The splenic veins patent.  The spleen is mildly enlarged but stable.  No obvious pancreatic mass but the pancreatic head is not well visualized due to persistent edema.  The left adrenal gland mass is stable.  A small right adrenal gland lesion is unchanged.  Both kidneys are unremarkable.  The stomach, duodenum and small bowel are grossly normal.  The colon is unremarkable.  Moderate stool throughout the colon may suggest constipation.  The appendix is normal.  Advanced atherosclerotic changes involving the aorta but no focal aneurysm or dissection.  No mesenteric or retroperitoneal lymphadenopathy.  The uterus and ovaries are stable.  Calcifications are noted on the left.  The bladder is unremarkable.  No pelvic mass, adenopathy or significant free pelvic fluid collections.  No significant bony findings.  Review of the MIP images confirms the above findings.  IMPRESSION:  1. Slight decrease in size of the subdiaphragmatic fluid collection with air, likely a contracting abscess. 2.  Stable surgical changes involving the liver with ill-defined low attenuation material surrounding the portal vein and portal structures. 3.  No other significant abdominal/pelvic findings.   Original Report Authenticated By: Rudie Meyer, M.D.    Dg Abd Acute W/chest  11/03/2012  *RADIOLOGY  REPORT*  Clinical Data: Abdominal pain, bile duct cancer, on chemotherapy  ACUTE ABDOMEN SERIES (ABDOMEN 2 VIEW & CHEST 1 VIEW)  Comparison: CT chest abdomen pelvis dated 09/14/2012  Findings: Chronic interstitial markings.  No focal consolidation. Scattered small pulmonary nodules/opacities, corresponding to metastases on CT.  Small right pleural effusion.  Right chest power port.  The heart is top normal in size.  Lucency beneath the medial right lung base, likely loculated, favored to reflect gas within a subdiaphragmatic abscess when correlating with CT.  Nonspecific bowel  gas pattern without findings to suggest small bowel obstruction.  No evidence of free air on the lateral decubitus view.  Surgical clips in the right upper abdomen.  IMPRESSION: Scattered small pulmonary nodules/opacities, corresponding to metastases on CT.  Small right pleural effusion.  Lucency beneath the medial right lung base, favored to reflect gas within a subdiaphragmatic abscess when correlating with CT.  No evidence of bowel obstruction or free air.   Original Report Authenticated By: Charline Bills, M.D.     Date: 11/03/2012  Rate: 84   Rhythm: normal sinus rhythm  QRS Axis: left   Intervals: normal and  ST/T Wave abnormalities: normal  Conduction Disutrbances:none  Narrative Interpretation: PVCs   Old EKG Reviewed: changes noted    No diagnosis found.  Medications  vancomycin (VANCOCIN) IVPB 1000 mg/200 mL premix (not administered)  sodium chloride 0.9 % bolus 1,000 mL (0 mLs Intravenous Stopped 11/03/12 1617)  ondansetron (ZOFRAN) injection 4 mg (4 mg Intravenous Given 11/03/12 1453)  morphine 4 MG/ML injection 6 mg (6 mg Intravenous Given 11/03/12 1454)  metroNIDAZOLE (FLAGYL) IVPB 500 mg (500 mg Intravenous New Bag/Given 11/03/12 1618)  ceFEPIme (MAXIPIME) 1 g in dextrose 5 % 50 mL IVPB (0 g Intravenous Stopped 11/03/12 1617)  potassium chloride SA (K-DUR,KLOR-CON) CR tablet 40 mEq (40 mEq Oral Given 11/03/12 1622)  iohexol (OMNIPAQUE) 350 MG/ML injection 100 mL (100 mLs Intravenous Contrast Given 11/03/12 1711)  iohexol (OMNIPAQUE) 300 MG/ML solution 50 mL (50 mLs Oral Contrast Given 11/03/12 1600)     MDM  Patient's 39-year-old female with a history of cholangiocarcinoma with metastasis to the lungs are presented to the emergency department febrile complaining of worsened fatigue, shortness of breath, abdominal pain, and vomiting.  There was concern for pulmonary embolus as well as possible small bowel obstruction which have both been ruled out via imaging.  Labs reviewed  showing stable anemia, leukocytosis and mild hypokalemia as well as elevated liver enzymes which is expected.  Potassium replenished orally in the emergency department, broad-spectrum IV antibiotics and IV fluids.  Patient is to be admitted to hospitalist.  Plan discussed with family and patient were agreeable. The patient appears reasonably stabilized for admission considering the current resources, flow, and capabilities available in the ED at this time, and I doubt any other [facility] requiring further screening and/or treatment in the ED prior to admission. BP 155/81  Pulse 79  Temp(Src) 100.1 F (37.8 C) (Oral)  Resp 21  SpO2 99%          Jaci Carrel, PA-C 11/03/12 1812

## 2012-11-03 NOTE — ED Notes (Signed)
Pt placed on stepdown unit and charge nurse on that floor questioned whether she should come to that floor or not. She then called the hospitalist and flow manager and had the pt moved to 3rd floor med surg/oncology. Pt was pended to 3E and when attempting to call report that nurse stated that they could not take the patient because she had cardiac monitoring orders. ED nurse then had to page hospitalist to get orders d/c'd so pt could go to that floor. Eventually placed on 3W.

## 2012-11-04 ENCOUNTER — Telehealth: Payer: Self-pay | Admitting: Hematology & Oncology

## 2012-11-04 DIAGNOSIS — F329 Major depressive disorder, single episode, unspecified: Secondary | ICD-10-CM

## 2012-11-04 DIAGNOSIS — C7800 Secondary malignant neoplasm of unspecified lung: Secondary | ICD-10-CM

## 2012-11-04 DIAGNOSIS — R634 Abnormal weight loss: Secondary | ICD-10-CM

## 2012-11-04 DIAGNOSIS — F3289 Other specified depressive episodes: Secondary | ICD-10-CM

## 2012-11-04 LAB — GLUCOSE, CAPILLARY: Glucose-Capillary: 106 mg/dL — ABNORMAL HIGH (ref 70–99)

## 2012-11-04 LAB — URINE CULTURE

## 2012-11-04 LAB — CBC
Platelets: 70 10*3/uL — ABNORMAL LOW (ref 150–400)
RDW: 15.9 % — ABNORMAL HIGH (ref 11.5–15.5)
WBC: 8.8 10*3/uL (ref 4.0–10.5)

## 2012-11-04 LAB — COMPREHENSIVE METABOLIC PANEL
Alkaline Phosphatase: 119 U/L — ABNORMAL HIGH (ref 39–117)
BUN: 12 mg/dL (ref 6–23)
Chloride: 104 mEq/L (ref 96–112)
GFR calc Af Amer: 85 mL/min — ABNORMAL LOW (ref >90)
Glucose, Bld: 98 mg/dL (ref 70–99)
Potassium: 3.8 mEq/L (ref 3.5–5.1)
Total Bilirubin: 1 mg/dL (ref 0.3–1.2)

## 2012-11-04 NOTE — Plan of Care (Signed)
Problem: Phase I Progression Outcomes Goal: OOB as tolerated unless otherwise ordered Outcome: Not Met (add Reason) Pt is too lethargic.

## 2012-11-04 NOTE — ED Provider Notes (Signed)
Medical screening examination/treatment/procedure(s) were performed by non-physician practitioner and as supervising physician I was immediately available for consultation/collaboration.   Derwood Kaplan, MD 11/04/12 [phone]

## 2012-11-04 NOTE — Progress Notes (Signed)
Pt very sleepy and lethargic. Denies any pain. Husband at bedside. Taking PO fluids. Dr.Ennever in to see pt. Pt not oob today too lethargic. Turn and position q 2hr.

## 2012-11-04 NOTE — Plan of Care (Signed)
Problem: Phase I Progression Outcomes Goal: Pain controlled with appropriate interventions Outcome: Progressing Denies pain.     

## 2012-11-04 NOTE — Evaluation (Signed)
Physical Therapy Evaluation Patient Details Name: [name] MRN: [id number] DOB: 31-Dec-1950 Today's Date: 11/04/2012 Time: 1206-1223 PT Time Calculation (min): 17 min  PT Assessment / Plan / Recommendation Clinical Impression  Pt. is 62 y/o female admitted with fever, liver abcess on 11/03/12 . Pt. has h/o metastatic cholanigocarcinoma. Pt. was very lethargic and did not arouse with stimulation of attempts to mobilize. Per spouse pt. was ambulatory PTA. Plans to return home with 24/7 assistance. Pt. will benefit from PT while in acute care.     PT Assessment  Patient needs continued PT services    Follow Up Recommendations  Home health PT    Does the patient have the potential to tolerate intense rehabilitation      Barriers to Discharge        Equipment Recommendations  None recommended by PT    Recommendations for Other Services     Frequency Min 3X/week    Precautions / Restrictions Precautions Precautions: Fall   Pertinent Vitals/Pain       Mobility  Bed Mobility Details for Bed Mobility Assistance: pt. was unable to perform rolling at this time. attempted to assist in sitting but pt completely lethargic, would nod, open eyes, then fall asleep. Transfers Transfers: Not assessed Ambulation/Gait Ambulation/Gait Assistance: Not tested (comment)    Exercises     PT Diagnosis: Generalized weakness;Difficulty walking  PT Problem List: Decreased strength;Decreased activity tolerance;Decreased mobility;Decreased balance;Decreased cognition;Decreased knowledge of use of DME PT Treatment Interventions: DME instruction;Gait training;Functional mobility training;Therapeutic activities;Balance training;Patient/family education;Therapeutic exercise   PT Goals Acute Rehab PT Goals PT Goal Formulation: With family Time For Goal Achievement: 11/18/12 Potential to Achieve Goals: Good Pt will go Supine/Side to Sit: Independently PT Goal: Supine/Side to Sit - Progress: Goal  set today Pt will go Sit to Supine/Side: Independently PT Goal: Sit to Supine/Side - Progress: Goal set today Pt will go Sit to Stand: with supervision PT Goal: Sit to Stand - Progress: Goal set today Pt will go Stand to Sit: with supervision PT Goal: Stand to Sit - Progress: Goal set today Pt will Ambulate: 51 - 150 feet;with supervision;with least restrictive assistive device PT Goal: Ambulate - Progress: Goal set today  Visit Information  Last PT Received On: 11/04/12 Assistance Needed: +2    Subjective Data  Subjective: pt. occassionally nodded, generally not aroused. did mumble hospital Patient Stated Goal: per spouse, for pt to go home.   Prior Functioning  Home Living Lives With: Spouse Available Help at Discharge: Family Type of Home: House Home Access: Stairs to enter Secretary/administrator of Steps: 3 Entrance Stairs-Rails: Right Home Layout: One level Bathroom Shower/Tub: Engineer, manufacturing systems: Standard Home Adaptive Equipment: Straight cane Prior Function Level of Independence: Independent with assistive device(s) Able to Take Stairs?: Yes Communication Communication:  (nonverbal at the time)    Cognition  Cognition Overall Cognitive Status: Difficult to assess Arousal/Alertness: Lethargic Behavior During Session: Lethargic Cognition - Other Comments: spouse reports that pt. had no issues prior to admission. He reports that pt  is this way when she gets and infection.     Extremity/Trunk Assessment Right Upper Extremity Assessment RUE ROM/Strength/Tone: Deficits RUE ROM/Strength/Tone Deficits: pt does not hold extremety when placed. very floppy/low tone. Left Upper Extremity Assessment LUE ROM/Strength/Tone: Deficits LUE ROM/Strength/Tone Deficits: same as R LUE Sensation: Deficits Right Lower Extremity Assessment RLE ROM/Strength/Tone: Deficits RLE ROM/Strength/Tone Deficits: does flex knee/hip for 25 % supine Left Lower Extremity  Assessment LLE ROM/Strength/Tone: Deficits LLE ROM/Strength/Tone Deficits: same  as R   Balance Balance Balance Assessed: Yes  End of Session PT - End of Session Activity Tolerance:  (pt too lethargic) Patient left: in bed;with call bell/phone within reach Nurse Communication: Mobility status  GP     Rada Hay 11/04/2012, 3:23 PM Blanchard Kelch PT [phone]

## 2012-11-04 NOTE — Progress Notes (Signed)
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 Patient also reported subjective fevers.  In ED, evaluation included CT abdomen/pelvis which revealed persistent but smaller subdiaphragmatic abscess on the right just posterior to the liver, 5 x 5 cm. CT angio chest ruled out pulmonary embolism but it did show progressive pulmonary metastatic disease and stable right pleural effusion. CBC revealed mild leukocytosis of 11.1, hemoglobin of 9.6 (which is patient's baseline) and platelet count of 80 (which is lower than patient's baseline with normal platelet count). Patient was started on vanco, cefepime and flagyl in ED.   Assessment and Plan:   Principal Problem:  Fever of unknown origin  Likely from small liver abscess noted on CT abdomen. No obvious source of infection in lungs and urinalysis is unremarkable. Lactic acid is WNL. Procalcitonin level only mildly elevated 0.4.  We will discontinue vancomycin and cefepime. Continue Flagyl. Patient remains afebrile since admission. Active Problems:  HYPERTENSION  Continue metoprolol 100 mg daily. GERD  Continue protonix. 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 Per oncology she was not a further candidate for  chemotherapy. Leukocytosis  Secondary to possible liver abscess  White blood cell count is now within normal limits Anemia of chronic disease  Secondary to history of malignancy  Hemoglobin  Stable in range 8.3 - 9.6 Thrombocytopenia  Secondary to sequela of chemotherapy  No signs of bleed  Platelet count in range 60 - 80 Hypokalemia  repleted  Secondary to GI losses  Now potassium within normal limits Severe protein calorie malnutrition  Secondary to history of cholangiocarcinoma  Nutrition consulted  Code Status: Full  Family Communication: Updated family at bedside Disposition Plan: Home when stable   Antibiotics:  Vanco 3/25 --> 11/04/2012 Cefepime 3/25 --> 11/04/2012 Flagyl 3/25 -->  Manson Passey, MD  [facility]  Pager [phone]   If 7PM-7AM, please contact night-coverage [email or web address] Password [email or web address] 11/04/2012, 1:00 PM   LOS: 1 day    HPI/Subjective: No acute overnight events.  Objective: Filed Vitals:   11/03/12 1630 11/03/12 1700 11/03/12 2208 11/04/12 1046  BP: 149/79 146/74 138/70 140/74  Pulse:   72 76  Temp:   99.2 F (37.3 C)   TempSrc:   Oral   Resp: 19 22 16    Height:   5\' 3"  (1.6 m)   Weight:   62.1 kg (136 lb 14.5 oz)   SpO2:   100%     Intake/Output Summary (Last 24 hours) at 11/04/12 1300 Last data filed at 11/04/12 1041  Gross per 24 hour  Intake 1522.5 ml  Output   1100 ml  Net  422.5 ml    Exam:   General:  Pt is alert, follows commands appropriately, not in acute distress  Cardiovascular: Regular rate and rhythm, S1/S2, no murmurs, no rubs, no gallops  Respiratory: Clear to auscultation bilaterally, no wheezing, no crackles, no  rhonchi  Abdomen: Soft, non tender, non distended, bowel sounds present, no guarding  Extremities: No edema, pulses DP and PT palpable bilaterally  Neuro: Grossly nonfocal  Data Reviewed: Basic Metabolic Panel:  Recent Labs Lab 11/03/12 1500 11/03/12 2040 11/04/12 0510  NA 135 131* 136  K 3.3*  3.7 3.8  CL 100 100 104  CO2 25 24 24   GLUCOSE 101* 96 98  BUN 15 12 12   CREATININE 0.85 0.79 0.85  CALCIUM 9.1 8.6 9.1  MG  --  1.7  --   PHOS  --  2.5  --    Liver Function Tests:  Recent Labs Lab 11/03/12 1500 11/03/12 2040 11/04/12 0510  AST 43* 34 29  ALT 34 29 28  ALKPHOS 133* 121* 119*  BILITOT 1.1 1.0 1.0  PROT 7.2 6.3 6.2  ALBUMIN 2.8* 2.4* 2.5*   No results found for this basename: LIPASE, AMYLASE,  in the last 168 hours No results found for this basename: AMMONIA,  in the last 168 hours CBC:  Recent Labs Lab 11/03/12 1500 11/03/12 2040 11/04/12 0510  WBC 11.1* 9.3 8.8  NEUTROABS 9.4* 7.5  --   HGB 9.6* 8.3* 8.9*  HCT 28.4* 25.1* 26.3*  MCV 93.1 93.3 93.9  PLT 80* 68* 70*   Cardiac Enzymes: No results found for this basename: CKTOTAL, CKMB, CKMBINDEX, TROPONINI,  in the last 168 hours BNP: No components found with this basename: POCBNP,  CBG:  Recent Labs Lab 11/04/12 0748  GLUCAP 99    Recent Results (from the past 240 hour(s))  CULTURE, BLOOD (ROUTINE X 2)     Status: None   Collection Time    11/03/12  3:00 PM      Result Value Range Status   Specimen Description BLOOD RIGHT PORT   Final   Special Requests BOTTLES DRAWN AEROBIC ONLY   Final   Culture  Setup Time 11/03/2012 22:56   Final   Culture     Final   Value:        BLOOD CULTURE RECEIVED NO GROWTH TO DATE CULTURE WILL BE HELD FOR 5 DAYS BEFORE ISSUING A FINAL NEGATIVE REPORT   Report Status PENDING   Incomplete  CULTURE, BLOOD (ROUTINE X 2)     Status: None   Collection Time    11/03/12  3:08 PM      Result Value Range Status   Specimen Description BLOOD LEFT ARM   Final   Special Requests BOTTLES DRAWN AEROBIC AND ANAEROBIC   Final   Culture  Setup Time 11/03/2012 22:57   Final   Culture     Final   Value:        BLOOD CULTURE RECEIVED NO GROWTH TO DATE CULTURE WILL BE HELD FOR 5 DAYS BEFORE ISSUING A FINAL NEGATIVE REPORT   Report Status PENDING   Incomplete      Studies: Ct Angio Chest Pe W/cm &/or Wo Cm 11/03/2012  * IMPRESSION:  1.  No CT findings for pulmonary embolism. 2.  Mild fusiform enlargement of the ascending aorta.  No focal aneurysm or dissection. 3.  Enlargement of the main pulmonary artery suggesting pulmonary hypertension. 4.  Extensive coronary artery calcifications, advanced for age. 5.  Progressive pulmonary metastatic disease. 6.  Stable right pleural effusion with overlying atelectasis.  CT ABDOMEN AND PELVIS  Findings: There is a persistent but smaller subdiaphragmatic abscess on the right just posterior to the liver.  This measures 5 x 5 cm.  Other  smaller fluid collection bilaterally is also smaller.  Stable surgical changes involving the liver.  Persistent ill-defined low attenuation soft tissue surrounding the portal vein and portal structures with numerous surgical clips.  The portal vein is patent.  The splenic veins patent.  The spleen is mildly enlarged but stable.  No obvious pancreatic mass but the pancreatic head is not well visualized due to persistent edema.  The left adrenal gland mass is stable.  A small right adrenal gland lesion is unchanged.  Both kidneys are unremarkable.  The stomach, duodenum and small bowel are grossly normal.  The colon is unremarkable.  Moderate stool throughout the colon may suggest constipation.  The appendix is normal.  Advanced atherosclerotic changes involving the aorta but no focal aneurysm or dissection.  No mesenteric or retroperitoneal lymphadenopathy.  The uterus and ovaries are stable.  Calcifications are noted on the left.  The bladder is unremarkable.  No pelvic mass, adenopathy or significant free pelvic fluid collections.  No significant bony findings.  Review of the MIP images confirms the above findings.  IMPRESSION:  1. Slight decrease in size of the subdiaphragmatic fluid collection with air, likely a contracting abscess. 2.  Stable surgical changes involving the liver with ill-defined low  attenuation material surrounding the portal vein and portal structures. 3.  No other significant abdominal/pelvic findings.     Ct Abdomen Pelvis W Contrast 11/03/2012  * IMPRESSION:  1.  No CT findings for pulmonary embolism. 2.  Mild fusiform enlargement of the ascending aorta.  No focal aneurysm or dissection. 3.  Enlargement of the main pulmonary artery suggesting pulmonary hypertension. 4.  Extensive coronary artery calcifications, advanced for age. 5.  Progressive pulmonary metastatic disease. 6.  Stable right pleural effusion with overlying atelectasis.  CT ABDOMEN AND PELVIS  Findings: There is a persistent but smaller subdiaphragmatic abscess on the right just posterior to the liver.  This measures 5 x 5 cm.  Other smaller fluid collection bilaterally is also smaller.  Stable surgical changes involving the liver.  Persistent ill-defined low attenuation soft tissue surrounding the portal vein and portal structures with numerous surgical clips.  The portal vein is patent.  The splenic veins patent.  The spleen is mildly enlarged but stable.  No obvious pancreatic mass but the pancreatic head is not well visualized due to persistent edema.  The left adrenal gland mass is stable.  A small right adrenal gland lesion is unchanged.  Both kidneys are unremarkable.  The stomach, duodenum and small bowel are grossly normal.  The colon is unremarkable.  Moderate stool throughout the colon may suggest constipation.  The appendix is normal.  Advanced atherosclerotic changes involving the aorta but no focal aneurysm or dissection.  No mesenteric or retroperitoneal lymphadenopathy.  The uterus and ovaries are stable.  Calcifications are noted on the left.  The bladder is unremarkable.  No pelvic mass, adenopathy or significant free pelvic fluid collections.  No significant bony findings.  Review of the MIP images confirms the above findings.  IMPRESSION:  1. Slight decrease in size of the subdiaphragmatic fluid  collection with air, likely a contracting abscess. 2.  Stable surgical changes involving the liver with ill-defined low attenuation material surrounding the portal vein and portal structures. 3.  No other significant abdominal/pelvic findings.     Dg Abd Acute W/chest 11/03/2012  * IMPRESSION: Scattered small pulmonary nodules/opacities, corresponding to metastases on CT.  Small right pleural effusion.  Lucency beneath the medial right lung base, favored to  reflect gas within a subdiaphragmatic abscess when correlating with CT.  No evidence of bowel obstruction or free air.      Scheduled Meds: . B-complex with vitamin C  1 tablet Oral Daily  . ceFEPime (MAXIPIME) IV  1 g Intravenous Q8H  . docusate sodium  200 mg Oral Daily  . gabapentin  300 mg Oral TID  . metoprolol succinate  100 mg Oral q morning - 10a  . metronidazole  500 mg Intravenous Q8H  . mirtazapine  15 mg Oral QHS  . pantoprazole  40 mg Oral Daily  . polyethylene glycol  17 g Oral Daily  . vancomycin  750 mg Intravenous Q12H

## 2012-11-04 NOTE — Progress Notes (Signed)
[facility]  Patient Status: Active (receiving services up to time of hospitalization)  [facility] is providing the following services: PT  If patient discharges after hours, please call [phone].   Lanae Crumbly 11/04/2012, 1:12 PM

## 2012-11-04 NOTE — Plan of Care (Signed)
Problem: Phase I Progression Outcomes Goal: OOB as tolerated unless otherwise ordered Outcome: Progressing Too lethargic to get oob today.

## 2012-11-04 NOTE — Progress Notes (Signed)
Physical Therapy Treatment Patient Details Name: [name] MRN: [id number] DOB: 07-21-1951 Today's Date: 11/04/2012 Time: 3295-1884 PT Time Calculation (min): 14 min  PT Assessment / Plan / Recommendation Comments on Treatment Session  Pt. very slightly more aroused but continues with poor control of extremeties and trunk. Does not demonstrate enough control to attemt transfer to chair.     Follow Up Recommendations  Home health PT     Does the patient have the potential to tolerate intense rehabilitation     Barriers to Discharge        Equipment Recommendations  None recommended by PT    Recommendations for Other Services    Frequency Min 3X/week   Plan      Precautions / Restrictions Precautions Precautions: Fall   Pertinent Vitals/Pain Pt nods no to any pain?    Mobility  Bed Mobility Bed Mobility: Rolling Right;Right Sidelying to Sit;Sit to Sidelying Right Rolling Right: 4: Min assist;With rail Right Sidelying to Sit: 1: +2 Total assist Right Sidelying to Sit: Patient Percentage: 10% Sit to Sidelying Right: 1: +2 Total assist Sit to Sidelying Right: Patient Percentage: 0% Details for Bed Mobility Assistance: Pt. again with low tone for moving, arms drop from rail. trunk slumped to bed as returning pt to supine, no control of descent to bed. Transfers Transfers: Not assessed Details for Transfer Assistance: t pt is not holding trunk control and unable to keep either knee extended in test for strength, therefore  not deemed enough effort to safely attempt. Ambulation/Gait Ambulation/Gait Assistance: Not tested (comment)    Exercises     PT Diagnosis: Generalized weakness;Difficulty walking  PT Problem List: Decreased strength;Decreased activity tolerance;Decreased mobility;Decreased balance;Decreased cognition;Decreased knowledge of use of DME PT Treatment Interventions: DME instruction;Gait training;Functional mobility training;Therapeutic activities;Balance  training;Patient/family education;Therapeutic exercise   PT Goals Acute Rehab PT Goals PT Goal Formulation: With family Time For Goal Achievement: 11/18/12 Potential to Achieve Goals: Good Pt will go Supine/Side to Sit: Independently PT Goal: Supine/Side to Sit - Progress: Progressing toward goal Pt will go Sit to Supine/Side: Independently PT Goal: Sit to Supine/Side - Progress: Progressing toward goal Pt will go Sit to Stand: with supervision PT Goal: Sit to Stand - Progress: Goal set today Pt will go Stand to Sit: with supervision PT Goal: Stand to Sit - Progress: Goal set today Pt will Ambulate: 51 - 150 feet;with supervision;with least restrictive assistive device PT Goal: Ambulate - Progress: Goal set today  Visit Information  Last PT Received On: 11/04/12 Assistance Needed: +2    Subjective Data  Subjective: pt did nod, stated [name]. Patient Stated Goal: per spouse, for pt to go home.   Cognition  Cognition Overall Cognitive Status: Difficult to assess Arousal/Alertness: Lethargic Behavior During Session: Lethargic Cognition - Other Comments: Pt. did arouse more, appeared to wax and wane for arousal. spouse reports aroused to eat a little lunch.    Balance  Balance Balance Assessed: Yes Static Sitting Balance Static Sitting - Balance Support: Bilateral upper extremity supported Static Sitting - Level of Assistance: 2: Max assist Static Sitting - Comment/# of Minutes: Pt's trunk and extremeties jerking, unable to hold hands on RW while sitting. falls to side a nd backward without asistance.  End of Session PT - End of Session Activity Tolerance: Patient limited by fatigue Patient left: in bed;with call bell/phone within reach;with family/visitor present Nurse Communication: Mobility status (lethargy)   GP     [name] 11/04/2012, 3:32 PM

## 2012-11-04 NOTE — Telephone Encounter (Signed)
Recvd fax from [facility] advising that they have provided pt w a temporary supply of LORAZEPAM TAB 0.5MG .  The drug is either not included on our listed of covered drugs, called formulary, due to limitations on the number of day supply.   COPY SCANNED

## 2012-11-04 NOTE — Consult Note (Signed)
#   [id number] is consult note.  Sherry E.

## 2012-11-05 ENCOUNTER — Ambulatory Visit: Payer: Medicare Other | Admitting: Hematology & Oncology

## 2012-11-05 ENCOUNTER — Other Ambulatory Visit: Payer: Medicare Other | Admitting: Lab

## 2012-11-05 LAB — CBC
MCV: 94.5 fL (ref 78.0–100.0)
Platelets: 108 10*3/uL — ABNORMAL LOW (ref 150–400)
RDW: 16 % — ABNORMAL HIGH (ref 11.5–15.5)
WBC: 9.9 10*3/uL (ref 4.0–10.5)

## 2012-11-05 LAB — URINE CULTURE

## 2012-11-05 LAB — PREALBUMIN: Prealbumin: 5 mg/dL — ABNORMAL LOW (ref 17.0–34.0)

## 2012-11-05 MED ORDER — LORAZEPAM 2 MG/ML IJ SOLN
0.5000 mg | Freq: Four times a day (QID) | INTRAMUSCULAR | Status: DC | PRN
  Administered 2012-11-05: 0.5 mg via INTRAVENOUS
  Filled 2012-11-05: qty 1

## 2012-11-05 MED ORDER — ENSURE COMPLETE PO LIQD
237.0000 mL | Freq: Two times a day (BID) | ORAL | Status: DC
  Administered 2012-11-05 – 2012-11-10 (×6): 237 mL via ORAL

## 2012-11-05 MED ORDER — PROCHLORPERAZINE EDISYLATE 5 MG/ML IJ SOLN
10.0000 mg | Freq: Four times a day (QID) | INTRAMUSCULAR | Status: DC | PRN
  Administered 2012-11-05: 10 mg via INTRAVENOUS
  Filled 2012-11-05: qty 2

## 2012-11-05 NOTE — Progress Notes (Signed)
[name] is quite emotional this morning. I can understand this. We had a long talk yesterday about her situation. She realizes that she just is not going to be able to take any more treatment and that we need to focus on her comfort and quality of life.  We'll see what her prealbumin is. We will check her CBC. Sometimes, her blood count drops in a transfusion makes her feel better.  I she really needs to have good nutritional intake. I told her again, that her prognosis will really be dictated by her caloric intake. I am I sure how much she is taking in orally.  Have not seen any positive cultures on her as of yet.  Again, I think a quality of life his blood are focus needs to be. If a palliative care consult need to be done that is fine. She definitely will need hospice when she is discharged.  [name] 4:10

## 2012-11-05 NOTE — Progress Notes (Signed)
Thank you for consulting the Palliative Medicine Team at [facility] to meet your patient's and family's needs.   The reason that you asked [place] to see your patient is for goals of care discussion  We have scheduled your patient for a meeting: tomorrow, Friday 11/06/12 @ 2:00 pm  The Surrogate decision maker is: patient alert on this visit does indicate she would like her family to participate in discussion about goals of care husband Marilu Favre, daughter Marcelino Duster and son Clovia Cuff information: husb Marilu Favre [phone]; Larena Sox  [phone]; son Jonny Ruiz [phone]  Your patient is able/unable to participate: able to participate in discussion- on visit multiple family members in room-patient up in chair, alert, quiet voice when answering; confirmed she is willing to meet with PMT and who she would like present for meeting, as noted above   Valente David, RN 11/05/2012, 2:44 PM Palliative Medicine Team RN Liaison [phone]

## 2012-11-05 NOTE — Progress Notes (Signed)
Found pt sitting in chair visiting with her daughter and nephew. Pt states she has been told she is dying and they can't do anything else for her.  She says its in God's hands...appears to be at peace. Says she is getting weaker and sometimes can't feed herself but does not like to bother the staff.  Provided ministry of presence as well as emotional and spiritual support. Will continue to follow as needed.  Rutherford Nail Chaplain

## 2012-11-05 NOTE — Progress Notes (Signed)
Physical Therapy Treatment Patient Details Name: [name] MRN: [id number] DOB: 11-Aug-1951 Today's Date: 11/05/2012 Time: 9528-4132 PT Time Calculation (min): 18 min  PT Assessment / Plan / Recommendation Comments on Treatment Session  Pt. is more awake and answered some simple questions. Pt. is noted to have decreased control of LE movements and presents with ataxia. Did not attempt to ambulate due to lack of static control of knee extension and diffficulty maitaining a grip on RW. Continue PT for mobility and strengthening. If strength does not return to prior level, pt may need increased care and addidtional DME/WC. spouse not present today.    Follow Up Recommendations  Home health PT     Does the patient have the potential to tolerate intense rehabilitation     Barriers to Discharge        Equipment Recommendations   (may need WC.)    Recommendations for Other Services    Frequency Min 3X/week   Plan Discharge plan remains appropriate;Frequency remains appropriate    Precautions / Restrictions Precautions Precautions: Fall   Pertinent Vitals/Pain     Mobility  Bed Mobility Right Sidelying to Sit: 4: Min guard Transfers Transfers: Sit to Stand;Stand to Sit;Stand Pivot Transfers Sit to Stand: 3: Mod assist;From bed Stand to Sit: 4: Min assist;To chair/3-in-1;To bed Stand Pivot Transfers: 3: Mod assist Details for Transfer Assistance: Pt. was able to stand at RW. Pt. does continue to have decreased motor control and hands will drop from RW, knees "dip" while standing.    Exercises General Exercises - Lower Extremity Ankle Circles/Pumps: AAROM;Both;10 reps;Seated Long Arc Quad: AROM;Both;10 reps;Seated Hip Flexion/Marching: AROM;Both;10 reps;Seated   PT Diagnosis:    PT Problem List:   PT Treatment Interventions:     PT Goals Acute Rehab PT Goals Pt will go Supine/Side to Sit: Independently PT Goal: Supine/Side to Sit - Progress: Progressing toward goal Pt  will go Sit to Stand: with supervision PT Goal: Sit to Stand - Progress: Progressing toward goal Pt will go Stand to Sit: with supervision PT Goal: Stand to Sit - Progress: Progressing toward goal  Visit Information  Last PT Received On: 11/05/12 Assistance Needed: +2    Subjective Data  Subjective: I am OK. whatever.   Cognition  Cognition Arousal/Alertness: Awake/alert Behavior During Session: Flat affect    Balance  Static Sitting Balance Static Sitting - Balance Support: Bilateral upper extremity supported Static Sitting - Comment/# of Minutes: Pt. was able to sit up at edge of bed today, at times listing but able to control trunk.  End of Session PT - End of Session Activity Tolerance: Patient limited by fatigue;Patient tolerated treatment well Patient left: in chair;with call bell/phone within reach Nurse Communication: Mobility status   GP     Rada Hay 11/05/2012, 1:22 PM Blanchard Kelch PT [phone]

## 2012-11-05 NOTE — Progress Notes (Signed)
INITIAL NUTRITION ASSESSMENT  DOCUMENTATION CODES Per approved criteria  -Not Applicable   INTERVENTION: - Ensure Complete BID - Will continue to monitor  NUTRITION DIAGNOSIS: Inadequate oral intake related to poor appetite, nausea/vomiting as evidenced by <50% meal intake.   Goal: 1. No further nausea/vomiting 2. Pt to consume >90% of meals/supplements  Monitor:  Weights, labs, intake, nausea/vomiting  Reason for Assessment: Consult  62 y.o. 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 Met with pt and husband yesterday, pt was asleep. Husband reported pt was eating well PTA, 3 meals/day with good appetite, until Sunday when she became nauseated. No significant weight loss reported. He reports pt did not have any nausea/vomiting yesterday.   Height: Ht Readings from Last 1 Encounters:  11/03/12 5\' 3"  (1.6 m)    Weight: Wt Readings from Last 1 Encounters:  11/05/12 142 lb 6.7 oz (64.6 kg)    Ideal Body Weight: 115 lb  % Ideal Body Weight: 123  Wt Readings from Last 10 Encounters:  11/05/12 142 lb 6.7 oz (64.6 kg)  10/09/12 136 lb (61.689 kg)  10/02/12 138 lb 14.4 oz (63.005 kg)  09/30/12 139 lb (63.05 kg)  09/17/12 139 lb (63.05 kg)  08/24/12 143 lb (64.864 kg)  08/13/12 141 lb (63.957 kg)  08/13/12 142 lb (64.411 kg)  07/30/12 144 lb (65.318 kg)  07/16/12 151 lb (68.493 kg)    Usual Body Weight: 142 lb  % Usual Body Weight: 100  BMI:  Body mass index is 25.23 kg/(m^2).  Estimated Nutritional Needs: Kcal: 1300-1600 Protein: 65-75g Fluid: 1.3-1.6L/day  Skin: Intact  Diet Order: Clear Liquid  EDUCATION NEEDS: -No education needs identified at this time   Intake/Output Summary (Last 24 hours) at 11/05/12 0855 Last data filed at 11/05/12 0700  Gross per 24 hour  Intake   3840 ml  Output    800 ml  Net   3040 ml    Last BM: 3/22  Labs:   Recent Labs Lab  11/03/12 1500 11/03/12 2040 11/04/12 0510  NA 135 131* 136  K 3.3* 3.7 3.8  CL 100 100 104  CO2 25 24 24   BUN 15 12 12   CREATININE 0.85 0.79 0.85  CALCIUM 9.1 8.6 9.1  MG  --  1.7  --   PHOS  --  2.5  --   GLUCOSE 101* 96 98    CBG (last 3)   Recent Labs  11/04/12 0748 11/04/12 1709 11/05/12 0756  GLUCAP 99 106* 85    Scheduled Meds: . B-complex with vitamin C  1 tablet Oral Daily  . ceFEPime (MAXIPIME) IV  1 g Intravenous Q8H  . docusate sodium  200 mg Oral Daily  . gabapentin  300 mg Oral TID  . metoprolol succinate  100 mg Oral q morning - 10a  . metronidazole  500 mg Intravenous Q8H  . mirtazapine  15 mg Oral QHS  . pantoprazole  40 mg Oral Daily  . polyethylene glycol  17 g Oral Daily  . vancomycin  750 mg Intravenous Q12H    Continuous Infusions: . sodium chloride 50 mL/hr at 11/05/12 1610    Past Medical History  Diagnosis Date  . Hypertension   . Hyperlipidemia   . GERD (gastroesophageal reflux disease)   . Depression   . Esophageal stricture 2003     [place] post esophageal dilation in 2003 performed by Dr. Marina Goodell, second esophageal dilation performed in 2006  by  Dr. Marina Goodell  . Obstructive jaundice due to malignant neoplasm  February 2012     status post ERCP done by Dr. Marina Goodell with biliary sphincterotomy, bile duct stricture brushings, biliary stent placement, performed secondary to obstructive jaundice, malignant-appearing bile duct stricture of the common hepatic duct worrisome for primary cholangiocarcinoma, status post ERCP with biliary sphincterotomy and biliary stent placement...status post biopsy -  markedly atypical cells cons  . Rotator cuff syndrome 2010     per MRI March 2010 there is a prominent articular surface partial tear of the supraspinatus which is nearly full thickness, as well as a tear of the distal anterior supraspinatus which probably represents a full-thickness partial tear,  with moderate supraspinatus tendinopathy, moderate  infraspinatus tendinopathy and prominent undersurface tear of the acromion along the coracoacromial ligament  . Hiatal hernia   . SOB (shortness of breath) on exertion   . Bile duct adenocarcinoma   . Myocardial infarction   . Angina   . Headache   . Arthritis   . Dysrhythmia   . Peripheral vascular disease   . Anemia   . Diarrhea   . Nausea   . Cholangiocarcinoma   . Complication of anesthesia   . Pneumonia 08/13/2012    Past Surgical History  Procedure Laterality Date  . Cholecystectomy   2007  .  aortogram    August 2011     ultrasound-guided access to the right common femoral artery, an aortogram with bilateral iliac arteriogram and bilateral lower extremity runoff, this was performed secondary to progressive bilateral lower extremity claudication, left common iliac artery occlusion as well as short segment of right superficial femoral artery occlusion, procedure performed by Dr. Durwin Nora  . Bypass graft   January 2009     of the infected femoral-femoral graft with vein patch angioplasty of bilateral femoral artery, 7 by [name],   .  abdominal aortic angiography   September 2008     performed by Dr. 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 Cardiac catheterization   August 2011     patent coronary arteries with diffuse nonobstructive disease, there are no areas of high grade stenosis present,  determined the chest pain is most likely noncardiac in origin, continue medical management.  . Bile duct surgery      60% of liver removed  . Arthroscopy knee w/ drilling    . Esophagogastroduodenoscopy  07/10/2011    Procedure: ESOPHAGOGASTRODUODENOSCOPY (EGD);  Surgeon: Yancey Flemings, MD;  Location: [facility];  Service: Endoscopy;  Laterality: N/A;  . Liver resection      section of liver resceted due to  bile duct ca  . Esophagogastroduodenoscopy  07/20/2011    Procedure: ESOPHAGOGASTRODUODENOSCOPY (EGD);  Surgeon: Erick Blinks, MD;  Location: [facility];  Service: Gastroenterology;  Laterality: N/A;  . Esophagogastroduodenoscopy  07/31/2011    Procedure: ESOPHAGOGASTRODUODENOSCOPY (EGD);  Surgeon: Yancey Flemings, MD;  Location: [facility];  Service: Endoscopy;  Laterality: N/A;  . Mass excision  03/05/2012    Procedure: EXCISION MASS;  Surgeon: Adolph Pollack, MD;  Location: [facility] OR;  Service: General;  Laterality: N/A;  Right chest wall     Levon Hedger MS, RD, LDN [phone] Pager [phone] After Hours Pager

## 2012-11-05 NOTE — Progress Notes (Addendum)
[facility] PROGRESS NOTE  [name] UJW:[id number] DOB: 11/08/1950 DOA: 11/03/2012 PCP: Genella Mech, MD  Brief narrative: 62 year old female with multiple medical co morbidities including but not limited to metastatic cholangiocarcinoma, metastasis to lungs (was on weekly Abraxane and per oncology is not considered to be a further candidate for systemic chemo, last chemo in February, 2014), dyslipidemia, GERD and depression who presented to [facility] ED 11/03/2012 with worsening epigastric abdominal pain over past few weeks prior to this admission associated with nausea and vomiting and poor oral intake. In ED, evaluation included CT abdomen/pelvis which revealed persistent but smaller subdiaphragmatic abscess on the right just posterior to the liver, 5 x 5 cm. CT angio chest ruled out pulmonary embolism but it did show progressive pulmonary metastatic disease and stable right pleural effusion. CBC revealed mild leukocytosis of 11.1, hemoglobin of 9.6 (which is patient's baseline) and platelet count of 80 (which is lower than patient's baseline with normal platelet count). Patient was started on vanco, cefepime and flagyl in ED.  I spoke extensively with the patient and her family about getting palliative care team involved for discussion of goals of care. I have informed the patient that antibiotics may not necessarily help with a hepatic abscess seen on imaging studies. Patient was agreeable to goals of care meeting which will take place tomorrow at 2 PM.  Assessment and Plan:   Principal Problem:  Fever and abdominal pain Likely from small liver abscess noted on CT abdomen. No obvious source of infection in lungs and urinalysis is unremarkable. Lactic acid is WNL. Procalcitonin level only mildly elevated 0.4.  Abdominal pain is resolved at this time. Patient tolerates regular diet. We will discontinue vancomycin and cefepime. Continue Flagyl.  Patient did spike fever today, T. max 101 F.  Blood cultures so far are negative.  Active Problems:  HYPERTENSION  Blood pressure at goal 117/79 Continue metoprolol 100 mg daily. GERD  Continue protonix. 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 Patient is no longer a candidate for further chemotherapy. Appreciate palliative care consult for goals of care. Leukocytosis  Secondary to possible liver abscess  White blood cell count is now within normal limits Anemia of chronic disease  Secondary to history of malignancy  Hemoglobin Stable in range 8.3 - 9.7 Thrombocytopenia  Secondary to sequela of chemotherapy  No signs of bleed  Platelet count is trending, 108 today. Hypokalemia  repleted  Secondary to GI losses  Now potassium within normal limits Severe protein calorie malnutrition  Secondary to history of cholangiocarcinoma  Nutrition consulted   Code Status: DNR/DNI Family Communication: Updated family at bedside  Disposition Plan: Home when stable    Consultants:   Oncology (Dr. Myna Hidalgo)  Palliative care  Antibiotics:  Vanco 3/25 --> 11/04/2012  Cefepime 3/25 --> 11/04/2012  Flagyl 3/25 -->   Manson Passey, MD  [facility]  Pager [phone]    If 7PM-7AM, please contact night-coverage [email or web address] Password [facility] 11/05/2012, 1:55 PM   LOS: 2 days    HPI/Subjective: No acute overnight events.  Objective: Filed Vitals:   11/04/12 2150 11/04/12 2200 11/05/12 0559 11/05/12 1337  BP: 87/52 94/60 112/71 117/79  Pulse: 76  72 89  Temp: 98.5 F (36.9 C)  98.5 F (36.9 C) 101 F (38.3 C)  TempSrc: Oral  Oral Oral  Resp: 20  20   Height:      Weight:   64.6 kg (142 lb 6.7 oz)   SpO2: 100%  100% 95%  Intake/Output Summary (Last 24 hours) at 11/05/12 1356 Last data filed at 11/05/12 0900  Gross per 24 hour  Intake   3200 ml  Output   1151 ml  Net   2049 ml    Exam:   General:  Pt is alert, not in acute distress  Cardiovascular: Regular rate and rhythm, S1/S2, no  murmurs, no rubs, no gallops  Respiratory: Clear to auscultation bilaterally, no wheezing, no crackles, no rhonchi  Abdomen: Soft, non tender, non distended, bowel sounds present, no guarding  Extremities: No edema, pulses DP and PT palpable bilaterally  Neuro: Grossly nonfocal  Data Reviewed: Basic Metabolic Panel:  Recent Labs Lab 11/03/12 1500 11/03/12 2040 11/04/12 0510  NA 135 131* 136  K 3.3* 3.7 3.8  CL 100 100 104  CO2 25 24 24   GLUCOSE 101* 96 98  BUN 15 12 12   CREATININE 0.85 0.79 0.85  CALCIUM 9.1 8.6 9.1  MG  --  1.7  --   PHOS  --  2.5  --    Liver Function Tests:  Recent Labs Lab 11/03/12 1500 11/03/12 2040 11/04/12 0510  AST 43* 34 29  ALT 34 29 28  ALKPHOS 133* 121* 119*  BILITOT 1.1 1.0 1.0  PROT 7.2 6.3 6.2  ALBUMIN 2.8* 2.4* 2.5*   No results found for this basename: LIPASE, AMYLASE,  in the last 168 hours No results found for this basename: AMMONIA,  in the last 168 hours CBC:  Recent Labs Lab 11/03/12 1500 11/03/12 2040 11/04/12 0510 11/05/12 1035  WBC 11.1* 9.3 8.8 9.9  NEUTROABS 9.4* 7.5  --   --   HGB 9.6* 8.3* 8.9* 9.7*  HCT 28.4* 25.1* 26.3* 29.1*  MCV 93.1 93.3 93.9 94.5  PLT 80* 68* 70* 108*   Cardiac Enzymes: No results found for this basename: CKTOTAL, CKMB, CKMBINDEX, TROPONINI,  in the last 168 hours BNP: No components found with this basename: POCBNP,  CBG:  Recent Labs Lab 11/04/12 0748 11/04/12 1709 11/05/12 0756  GLUCAP 99 106* 85    Recent Results (from the past 240 hour(s))  CULTURE, BLOOD (ROUTINE X 2)     Status: None   Collection Time    11/03/12  3:00 PM      Result Value Range Status   Specimen Description BLOOD RIGHT PORT   Final   Special Requests BOTTLES DRAWN AEROBIC ONLY   Final   Culture  Setup Time 11/03/2012 22:56   Final   Culture     Final   Value:        BLOOD CULTURE RECEIVED NO GROWTH TO DATE CULTURE WILL BE HELD FOR 5 DAYS BEFORE ISSUING A FINAL NEGATIVE REPORT   Report  Status PENDING   Incomplete  CULTURE, BLOOD (ROUTINE X 2)     Status: None   Collection Time    11/03/12  3:08 PM      Result Value Range Status   Specimen Description BLOOD LEFT ARM   Final   Special Requests BOTTLES DRAWN AEROBIC AND ANAEROBIC   Final   Culture  Setup Time 11/03/2012 22:57   Final   Culture     Final   Value:        BLOOD CULTURE RECEIVED NO GROWTH TO DATE CULTURE WILL BE HELD FOR 5 DAYS BEFORE ISSUING A FINAL NEGATIVE REPORT   Report Status PENDING   Incomplete  URINE CULTURE     Status: None   Collection Time  11/03/12  3:22 PM      Result Value Range Status   Specimen Description URINE, CLEAN CATCH   Final   Special Requests NONE   Final   Culture  Setup Time 11/04/2012 02:23   Final   Colony Count NO GROWTH   Final   Culture NO GROWTH   Final   Report Status 11/04/2012 FINAL   Final  URINE CULTURE     Status: None   Collection Time    11/04/12  7:35 AM      Result Value Range Status   Specimen Description URINE, RANDOM   Final   Special Requests NONE   Final   Culture  Setup Time 11/04/2012 16:40   Final   Colony Count NO GROWTH   Final   Culture NO GROWTH   Final   Report Status 11/05/2012 FINAL   Final     Studies: Ct Angio Chest Pe W/cm &/or Wo Cm 11/03/2012 IMPRESSION:  1.  No CT findings for pulmonary embolism. 2.  Mild fusiform enlargement of the ascending aorta.  No focal aneurysm or dissection. 3.  Enlargement of the main pulmonary artery suggesting pulmonary hypertension. 4.  Extensive coronary artery calcifications, advanced for age. 5.  Progressive pulmonary metastatic disease. 6.  Stable right pleural effusion with overlying atelectasis.  CT ABDOMEN AND PELVIS  Findings: There is a persistent but smaller subdiaphragmatic abscess on the right just posterior to the liver.  This measures 5 x 5 cm.  Other smaller fluid collection bilaterally is also smaller.  Stable surgical changes involving the liver.  Persistent ill-defined low attenuation  soft tissue surrounding the portal vein and portal structures with numerous surgical clips.  The portal vein is patent.  The splenic veins patent.  The spleen is mildly enlarged but stable.  No obvious pancreatic mass but the pancreatic head is not well visualized due to persistent edema.  The left adrenal gland mass is stable.  A small right adrenal gland lesion is unchanged.  Both kidneys are unremarkable.  The stomach, duodenum and small bowel are grossly normal.  The colon is unremarkable.  Moderate stool throughout the colon may suggest constipation.  The appendix is normal.  Advanced atherosclerotic changes involving the aorta but no focal aneurysm or dissection.  No mesenteric or retroperitoneal lymphadenopathy.  The uterus and ovaries are stable.  Calcifications are noted on the left.  The bladder is unremarkable.  No pelvic mass, adenopathy or significant free pelvic fluid collections.  No significant bony findings.  Review of the MIP images confirms the above findings.  IMPRESSION:  1. Slight decrease in size of the subdiaphragmatic fluid collection with air, likely a contracting abscess. 2.  Stable surgical changes involving the liver with ill-defined low attenuation material surrounding the portal vein and portal structures. 3.  No other significant abdominal/pelvic findings.   Original Report Authenticated By: Rudie Meyer, M.D.    Ct Abdomen Pelvis W Contrast 11/03/2012   IMPRESSION:  1.  No CT findings for pulmonary embolism. 2.  Mild fusiform enlargement of the ascending aorta.  No focal aneurysm or dissection. 3.  Enlargement of the main pulmonary artery suggesting pulmonary hypertension. 4.  Extensive coronary artery calcifications, advanced for age. 5.  Progressive pulmonary metastatic disease. 6.  Stable right pleural effusion with overlying atelectasis.  CT ABDOMEN AND PELVIS  Findings: There is a persistent but smaller subdiaphragmatic abscess on the right just posterior to the liver.  This  measures 5 x 5 cm.  Other  smaller fluid collection bilaterally is also smaller.  Stable surgical changes involving the liver.  Persistent ill-defined low attenuation soft tissue surrounding the portal vein and portal structures with numerous surgical clips.  The portal vein is patent.  The splenic veins patent.  The spleen is mildly enlarged but stable.  No obvious pancreatic mass but the pancreatic head is not well visualized due to persistent edema.  The left adrenal gland mass is stable.  A small right adrenal gland lesion is unchanged.  Both kidneys are unremarkable.  The stomach, duodenum and small bowel are grossly normal.  The colon is unremarkable.  Moderate stool throughout the colon may suggest constipation.  The appendix is normal.  Advanced atherosclerotic changes involving the aorta but no focal aneurysm or dissection.  No mesenteric or retroperitoneal lymphadenopathy.  The uterus and ovaries are stable.  Calcifications are noted on the left.  The bladder is unremarkable.  No pelvic mass, adenopathy or significant free pelvic fluid collections.  No significant bony findings.  Review of the MIP images confirms the above findings.  IMPRESSION:  1. Slight decrease in size of the subdiaphragmatic fluid collection with air, likely a contracting abscess. 2.  Stable surgical changes involving the liver with ill-defined low attenuation material surrounding the portal vein and portal structures. 3.  No other significant abdominal/pelvic findings.   Original Report Authenticated By: Rudie Meyer, M.D.    Dg Abd Acute W/chest 11/03/2012   IMPRESSION: Scattered small pulmonary nodules/opacities, corresponding to metastases on CT.  Small right pleural effusion.  Lucency beneath the medial right lung base, favored to reflect gas within a subdiaphragmatic abscess when correlating with CT.  No evidence of bowel obstruction or free air.   Original Report Authenticated By: Charline Bills, M.D.     Scheduled  Meds: . B-complex with vitamin C  1 tablet Oral Daily  . docusate sodium  200 mg Oral Daily  . feeding supplement  237 mL Oral BID BM  . gabapentin  300 mg Oral TID  . metoprolol succinate  100 mg Oral q morning - 10a  . metronidazole  500 mg Intravenous Q8H  . mirtazapine  15 mg Oral QHS  . pantoprazole  40 mg Oral Daily  . polyethylene glycol  17 g Oral Daily   Continuous Infusions: . sodium chloride 50 mL/hr at 11/05/12 [phone]

## 2012-11-05 NOTE — Consult Note (Signed)
NAMECHARLY, [name] NO.:  [id number]  MEDICAL RECORD NO.:  [id number]  LOCATION:  1344                         FACILITY:  [facility]  PHYSICIAN:  Josph Macho, M.D.  DATE OF BIRTH:  1950/10/31  DATE OF CONSULTATION: DATE OF DISCHARGE:                                CONSULTATION   REFERRING PHYSICIAN:  Manson Passey, MD  REASON FOR CONSULTATION:  Metastatic cholangiocarcinoma.  HISTORY OF PRESENT ILLNESS:  [name] is a very nice 62 year old African American female.  She has had a very interesting history with her cholangiocarcinoma.  She presented with this back in March of 2012. She went to [facility].  She underwent resection.  This was complicated by postop issues.  The patient then was found to have metastatic disease with pulmonary metastases.  The patient has been on numerous lines of chemotherapy.  She has had issues.  She has had bleeding.  She has had radiation treatments.  She has been in and out of the hospital frequently.  There was a point in which her bone marrow was not working too well.  We did not give her any chemotherapy.  We subsequently treated her with targeted therapy which actually seemed to stabilize her cancer before started to grow again.  She developed a subcutaneous disease that was resected.  I think she last was on Abraxane.  The patient has had more issues.  She has had weakness.  She is losing weight.  She then developed a subdiaphragmatic abscess.  This is onto the right diaphragm.  She actually went to [facility] for an evaluation.  They did not feel that she needed any kind of surgical intervention or any type of drainage.  She subsequently was admitted.  She came in on May 25th.  She had a fever and her husband told me that she has "liver infection."  She did have a CT scan done.  This was done of the chest, abdomen and pelvis.  She unfortunately was found to have progressive lung metastasis.  She has evidence of  some pulmonary hypertension.  She has extensive coronary artery calcification.  The subdiaphragmatic abscess measures 5 x 5 cm.  She is on IV antibiotics.  So far, I think cultures have been negative to date.  She had lab work done.  Her white cell count was 11.1, hemoglobin 9.6, and platelet count was 80,000.  Her sodium was 136, potassium 3.3, BUN 15, creatinine 0.85.  Calcium 9.1 with an albumin of 2.8.  I am now seeing her to try to help management with respect to her end- stage disease.  I had a very long talk with her and her husband.  I sensate change with her.  I can tell she is much more weak.  Her prealbumin back a month ago was only 10.9.  She is losing weight.  Her weight today was 136.  Back in February, her weight was 139.  Karnofsky performance status is ECOG 3 at best.  PAST MEDICAL HISTORY:  Remarkable for: 1. Hypertension. 2. Coronary artery disease. 3. Peripheral vascular disease. 4. Osteoarthritis.  ALLERGIES: 1. COMPAZINE. 2. SULFA MEDICATIONS.  ADMISSION MEDICATIONS: 1. Alpha  lipoic acid 200 mg p.o. daily. 2. Colace 200 mg p.o. daily. 3. Gabapentin 300 mg p.o. t.i.d. 4. Ativan 0.5 mg q.8 hours p.r.n. 5. Toprol-XL 100 mg p.o. daily. 6. Remeron 15 mg p.o. at bedtime. 7. MSIR 15 mg p.o. q.6h p.r.n. 8. Prilosec 20 mg p.o. daily. 9. MiraLax 17 g p.o. daily.  SOCIAL HISTORY:  Remarkable for passive tobacco use.  She stopped 13 years ago.  FAMILY HISTORY:  Remarkable for coronary artery disease.  PHYSICAL EXAMINATION:  GENERAL:  This is a somewhat frail-appearing African American female in no obvious distress. VITAL SIGNS:  Temperature of 99.8, pulse 86, respiratory rate 18, blood pressure 119/75. HEAD AND NECK:  Shows no scleral icterus.  She has some slight dry oral mucosa.  No adenopathy is noted.  There may be some slight temporal muscle wasting. LUNGS:  Clear bilaterally. CARDIAC:  Regular rate and rhythm with a normal S1, S2.  There are  no murmurs, rubs, or bruits. ABDOMEN:  Soft.  She has decent bowel sounds.  There is no fluid wave. There is no palpable hepatomegaly.  There is no palpable splenomegaly. EXTREMITIES:  Show no clubbing, cyanosis, or edema.  She may have some slight muscle atrophy bilaterally.  Muscle strength is 4/5 bilaterally. NEUROLOGIC:  Shows no focal neurological deficits.  IMPRESSION:  [name] is a very nice 62 year old African American female with metastatic cholangiocarcinoma.  She has progressive disease.  I really believe that we are now at the point that we need to focus on comfort care.  I talked to her and her husband at length about this.  I do not see that she is a candidate for any further chemotherapy.  She typically comes into our office in a wheelchair now.  I want to make sure that we focus on her comfort.  I think the respect and dignity is the priority.  She does understand what I was telling her.  Her husband understands what I was telling her.  She agrees that no further chemotherapy will be beneficial for her.  I told her that I did not know how long that she had.  I think the prealbumin, which we will check tomorrow, will help [place] out.  I do believe that hospice is going to be helpful.  This I think will be able to provide some support for her and her family.  I did discuss code status with her.  Again, her husband was there.  [name] did understand what I was telling her.  She does not want to be kept alive on machines.  She does not wish to be resuscitated if her heart stopped or if her breathing stopped.  She understands that being on life support would not improve her quality of life and that she would not be able to come off life support, given her weak state.  We will be more than happy to help out while she is in the hospital.  I have known [name] for 2 years.     Josph Macho, M.D.     PRE/MEDQ  D:  11/04/2012  T:  11/05/2012  Job:  [id number]

## 2012-11-06 DIAGNOSIS — D649 Anemia, unspecified: Secondary | ICD-10-CM

## 2012-11-06 DIAGNOSIS — R1084 Generalized abdominal pain: Secondary | ICD-10-CM

## 2012-11-06 DIAGNOSIS — R531 Weakness: Secondary | ICD-10-CM | POA: Diagnosis present

## 2012-11-06 DIAGNOSIS — R5383 Other fatigue: Secondary | ICD-10-CM

## 2012-11-06 DIAGNOSIS — R5381 Other malaise: Secondary | ICD-10-CM

## 2012-11-06 DIAGNOSIS — I739 Peripheral vascular disease, unspecified: Secondary | ICD-10-CM

## 2012-11-06 DIAGNOSIS — K651 Peritoneal abscess: Secondary | ICD-10-CM

## 2012-11-06 LAB — BASIC METABOLIC PANEL
Chloride: 108 mEq/L (ref 96–112)
GFR calc Af Amer: 64 mL/min — ABNORMAL LOW (ref >90)
GFR calc non Af Amer: 55 mL/min — ABNORMAL LOW (ref >90)
Potassium: 3.6 mEq/L (ref 3.5–5.1)
Sodium: 139 mEq/L (ref 135–145)

## 2012-11-06 LAB — CBC
HCT: 25.7 % — ABNORMAL LOW (ref 36.0–46.0)
Hemoglobin: 8.4 g/dL — ABNORMAL LOW (ref 12.0–15.0)
MCHC: 32.7 g/dL (ref 30.0–36.0)
RDW: 16 % — ABNORMAL HIGH (ref 11.5–15.5)
WBC: 6.5 10*3/uL (ref 4.0–10.5)

## 2012-11-06 LAB — PREPARE RBC (CROSSMATCH)

## 2012-11-06 MED ORDER — MORPHINE SULFATE 15 MG PO TABS
15.0000 mg | ORAL_TABLET | ORAL | Status: DC | PRN
  Administered 2012-11-06 – 2012-11-10 (×9): 15 mg via ORAL
  Filled 2012-11-06 (×9): qty 1

## 2012-11-06 MED ORDER — PIPERACILLIN-TAZOBACTAM 3.375 G IVPB
3.3750 g | Freq: Three times a day (TID) | INTRAVENOUS | Status: DC
  Administered 2012-11-06 – 2012-11-08 (×6): 3.375 g via INTRAVENOUS
  Filled 2012-11-06 (×8): qty 50

## 2012-11-06 NOTE — Progress Notes (Signed)
Physical Therapy Treatment Patient Details Name: [name] MRN: [id number] DOB: 1950-12-14 Today's Date: 11/06/2012 Time: 9528-4132 PT Time Calculation (min): 14 min  PT Assessment / Plan / Recommendation Comments on Treatment Session  Pt progressing very well and making marked improvement from last session.  Pt states that she was fatigued last session.  Note that pt has palliative care meeting today at 2pm.  Continue to recommend HHPT for pt with 24/7 care.      Follow Up Recommendations  Home health PT     Does the patient have the potential to tolerate intense rehabilitation     Barriers to Discharge        Equipment Recommendations  None recommended by PT    Recommendations for Other Services    Frequency Min 3X/week   Plan Discharge plan remains appropriate;Frequency remains appropriate    Precautions / Restrictions Precautions Precautions: Fall Restrictions Weight Bearing Restrictions: No   Pertinent Vitals/Pain Pt states no pain today    Mobility  Bed Mobility Bed Mobility: Not assessed (Pt in recliner when PT arrived. ) Transfers Transfers: Sit to Stand;Stand to Sit Sit to Stand: 4: Min assist;With upper extremity assist;With armrests;From chair/3-in-1 Stand to Sit: 4: Min assist;With upper extremity assist;With armrests;To chair/3-in-1 Details for Transfer Assistance: Assist to rise, steady and ensure controlled descent with max cues for hand placement and safety.  Ambulation/Gait Ambulation/Gait Assistance: 4: Min assist Ambulation Distance (Feet): 500 Feet Assistive device: Rolling walker Ambulation/Gait Assistance Details: Assist to steady throughout with intermittent scissoring with LEs noted.  Also noted that this would happen when she increased gait speed.  Gait Pattern: Step-through pattern;Decreased stride length;Narrow base of support;Scissoring    Exercises     PT Diagnosis:    PT Problem List:   PT Treatment Interventions:     PT  Goals Acute Rehab PT Goals PT Goal Formulation: With family Time For Goal Achievement: 11/18/12 Potential to Achieve Goals: Good Pt will go Sit to Stand: with supervision PT Goal: Sit to Stand - Progress: Progressing toward goal Pt will go Stand to Sit: with supervision PT Goal: Stand to Sit - Progress: Progressing toward goal Pt will Ambulate: 51 - 150 feet;with supervision;with least restrictive assistive device PT Goal: Ambulate - Progress: Progressing toward goal  Visit Information  Last PT Received On: 11/06/12 Assistance Needed: +1    Subjective Data  Subjective: I was asleep when yall saw me last time.  Patient Stated Goal: per spouse, for pt to go home.   Cognition  Cognition Overall Cognitive Status: Appears within functional limits for tasks assessed/performed Arousal/Alertness: Awake/alert Orientation Level: Appears intact for tasks assessed Behavior During Session: [facility] for tasks performed Cognition - Other Comments: [name].     Balance     End of Session PT - End of Session Equipment Utilized During Treatment: Gait belt Activity Tolerance: Patient tolerated treatment well Patient left: in chair;with call bell/phone within reach Nurse Communication: Mobility status   GP     Vista Deck 11/06/2012, 2:06 PM

## 2012-11-06 NOTE — Consult Note (Signed)
INFECTIOUS DISEASE CONSULT NOTE  Date of Admission:  11/03/2012  Date of Consult:  11/06/2012  Reason for Consult: Liver Abscess Referring Physician: Rama  Impression/Recommendation Liver Abscess Cholangiocarcinoma Loose BM  Would: Continue antibiotics Check C. Difficile Consider IR drainage of abscess  Comment: I spoke at length to the patient and her family regarding her findings. She has had this collection for over 6 weeks now without difficulty and with seeming shrinkage in the size of the lesion. I cannot tentatively say that this is the cause of her abdominal and back pain, or fevers.  Typically the presence of an abscess would suggest the need for drainage, however given her overall comorbidities I am not clear how helpful this will be. I would not take her off of antibiotics, even at discharge, and less she has an aspirate showing malignant cytology in this presumed abscess. Dr. Luciana Axe is available for questions over the weekend thank you  Thank you so much for this interesting consult,   Johny Sax [phone]  [name] is an 62 y.o. female.  HPI: 62 year old female with a history of metastatic cholangiocarcinoma and hepatectomyoriginally diagnosed in March of 2012 [facility]). She was found at that time to have pulmonary metastases. She underwent her last cycle of chemotherapy in February of 2014, had previously received multiple cycles. She returns to the hospital on March 25 with worsening abdominal pain over several weeks, nausea/vomiting, and anorexia. She had subjective fevers at home but had temperature of 100.1 and white blood cell count of 11.1 on date of admission. She was started on vancomycin, cefepime, and Flagyl. Her CT scan on admission showed: 1. Slight decrease in size of the subdiaphragmatic fluid collection with air, likely a contracting abscess.  2. Stable surgical changes involving the liver with ill-defined low attenuation material surrounding the  portal vein and portal structures. She had repeat previous CT scan of the abdomen on February 3 which showed an ill-defined fluid and gas collection within the region of the operative bed, suspicious for infection (6.3 x 7.4 cm). There is also slight decrease in  definition of a hypoattenuating material in the porta hepatis suspicious for residual recurrent disease. Her blood cultures and urine cultures from admission have been negative. She had fever to 101 on March 27. She has been afebrile since.  Past Medical History  Diagnosis Date  . Hypertension   . Hyperlipidemia   . GERD (gastroesophageal reflux disease)   . Depression   . Esophageal stricture 2003     [place] post esophageal dilation in 2003 performed by Dr. Marina Goodell, second esophageal dilation performed in 2006  by Dr. Marina Goodell  . Obstructive jaundice due to malignant neoplasm  February 2012     status post ERCP done by Dr. Marina Goodell with biliary sphincterotomy, bile duct stricture brushings, biliary stent placement, performed secondary to obstructive jaundice, malignant-appearing bile duct stricture of the common hepatic duct worrisome for primary cholangiocarcinoma, status post ERCP with biliary sphincterotomy and biliary stent placement...status post biopsy -  markedly atypical cells cons  . Rotator cuff syndrome 2010     per MRI March 2010 there is a prominent articular surface partial tear of the supraspinatus which is nearly full thickness, as well as a tear of the distal anterior supraspinatus which probably represents a full-thickness partial tear,  with moderate supraspinatus tendinopathy, moderate infraspinatus tendinopathy and prominent undersurface tear of the acromion along the coracoacromial ligament  . Hiatal hernia   . SOB (shortness of breath) on exertion   .  Bile duct adenocarcinoma   . Myocardial infarction   . Angina   . Headache   . Arthritis   . Dysrhythmia   . Peripheral vascular disease   . Anemia   . Diarrhea   .  Nausea   . Cholangiocarcinoma   . Complication of anesthesia   . Pneumonia 08/13/2012    Past Surgical History  Procedure Laterality Date  . Cholecystectomy   2007  .  aortogram    August 2011     ultrasound-guided access to the right common femoral artery, an aortogram with bilateral iliac arteriogram and bilateral lower extremity runoff, this was performed secondary to progressive bilateral lower extremity claudication, left common iliac artery occlusion as well as short segment of right superficial femoral artery occlusion, procedure performed by Dr. Durwin Nora  . Bypass graft   January 2009     of the infected femoral-femoral graft with vein patch angioplasty of bilateral femoral artery, 7 by [name],   .  abdominal aortic angiography   September 2008     performed by Dr. 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 Cardiac catheterization   August 2011     patent coronary arteries with diffuse nonobstructive disease, there are no areas of high grade stenosis present,  determined the chest pain is most likely noncardiac in origin, continue medical management.  . Bile duct surgery      60% of liver removed  . Arthroscopy knee w/ drilling    . Esophagogastroduodenoscopy  07/10/2011    Procedure: ESOPHAGOGASTRODUODENOSCOPY (EGD);  Surgeon: Yancey Flemings, MD;  Location: [facility];  Service: Endoscopy;  Laterality: N/A;  . Liver resection      section of liver resceted due to bile duct ca  . Esophagogastroduodenoscopy  07/20/2011    Procedure: ESOPHAGOGASTRODUODENOSCOPY (EGD);  Surgeon: Erick Blinks, MD;  Location: [facility];  Service: Gastroenterology;  Laterality: N/A;  . Esophagogastroduodenoscopy  07/31/2011    Procedure: ESOPHAGOGASTRODUODENOSCOPY (EGD);  Surgeon: Yancey Flemings, MD;  Location: [facility];   Service: Endoscopy;  Laterality: N/A;  . Mass excision  03/05/2012    Procedure: EXCISION MASS;  Surgeon: Adolph Pollack, MD;  Location: [facility] OR;  Service: General;  Laterality: N/A;  Right chest wall     Allergies  Allergen Reactions  . Promethazine Hcl Anxiety and Other (See Comments)    Change in LOC  . Sulfonamide Derivatives Hives    All over the body    Medications:  Scheduled: . B-complex with vitamin C  1 tablet Oral Daily  . docusate sodium  200 mg Oral Daily  . feeding supplement  237 mL Oral BID BM  . gabapentin  300 mg Oral TID  . metoprolol succinate  100 mg Oral q morning - 10a  . mirtazapine  15 mg Oral QHS  . pantoprazole  40 mg Oral Daily  . piperacillin-tazobactam (ZOSYN)  IV  3.375 g Intravenous Q8H  . polyethylene glycol  17 g Oral Daily    Total days of antibiotics: 4 (vanco/cefepime/flagyl --> zosyn)          Social History:  reports that she quit smoking about 13 years ago. She has never used smokeless tobacco. She reports that she does not drink alcohol or use illicit drugs.  Family History  Problem Relation Age of Onset  . Heart disease Brother  General ROS: She has a right chest Port-A-Cath which she has had for over 2 years. She has had no difficulties with this. She has had normal urination. She is a loose bowel movements after being given MiraLAX. She has had posttussive emesis. Otherwise her cough has been nonproductive. See history of present illness.  Blood pressure 99/77, pulse 70, temperature 98.2 F (36.8 C), temperature source Oral, resp. rate 18, height 5\' 3"  (1.6 m), weight 65.6 kg (144 lb 10 oz), SpO2 99.00%. General appearance: alert, cooperative and no distress Eyes: negative findings: pupils equal, round, reactive to light and accomodation Throat: normal findings: oropharynx pink & moist without lesions or evidence of thrush Neck: no adenopathy and supple, symmetrical, trachea midline Lungs: clear to auscultation  bilaterally Heart: regular rate and rhythm Abdomen: normal findings: bowel sounds normal and soft, non-tender, abnormal findings:  Mild right-sided subjective tenderness and . Extremities: edema 2+ bilateral lower extremities She has a right upper chest Port-A-Cath. There is no fluctuance to this there, is no tenderness, no increase in heat.   Results for orders placed during the hospital encounter of 11/03/12 (from the past 48 hour(s))  GLUCOSE, CAPILLARY     Status: Abnormal   Collection Time    11/04/12  5:09 PM      Result Value Range   Glucose-Capillary 106 (*) 70 - 99 mg/dL   Comment 1 Documented in Chart     Comment 2 Notify RN    PROCALCITONIN     Status: None   Collection Time    11/05/12  6:35 AM      Result Value Range   Procalcitonin 0.51     Comment:            Interpretation:     PCT > 0.5 ng/mL and <= 2 ng/mL:     Systemic infection (sepsis) is possible,     but other conditions are known to elevate     PCT as well.     (NOTE)             ICU PCT Algorithm               Non ICU PCT Algorithm        ----------------------------     ------------------------------             PCT < 0.25 ng/mL                 PCT < 0.1 ng/mL         Stopping of antibiotics            Stopping of antibiotics           strongly encouraged.               strongly encouraged.        ----------------------------     ------------------------------           PCT level decrease by               PCT < 0.25 ng/mL           >= 80% from peak PCT           OR PCT 0.25 - 0.5 ng/mL          Stopping of antibiotics  encouraged.         Stopping of antibiotics               encouraged.        ----------------------------     ------------------------------           PCT level decrease by              PCT >= 0.25 ng/mL           < 80% from peak PCT            AND PCT >= 0.5 ng/mL            Continuing antibiotics                                                   encouraged.           Continuing antibiotics                encouraged.        ----------------------------     ------------------------------         PCT level increase compared          PCT > 0.5 ng/mL             with peak PCT AND              PCT >= 0.5 ng/mL             Escalation of antibiotics                                              strongly encouraged.          Escalation of antibiotics            strongly encouraged.  PREALBUMIN     Status: Abnormal   Collection Time    11/05/12  6:35 AM      Result Value Range   Prealbumin 5.0 (*) 17.0 - 34.0 mg/dL  GLUCOSE, CAPILLARY     Status: None   Collection Time    11/05/12  7:56 AM      Result Value Range   Glucose-Capillary 85  70 - 99 mg/dL  CBC     Status: Abnormal   Collection Time    11/05/12 10:35 AM      Result Value Range   WBC 9.9  4.0 - 10.5 K/uL   RBC 3.08 (*) 3.87 - 5.11 MIL/uL   Hemoglobin 9.7 (*) 12.0 - 15.0 g/dL   HCT 16.1 (*) 09.6 - 04.5 %   MCV 94.5  78.0 - 100.0 fL   MCH 31.5  26.0 - 34.0 pg   MCHC 33.3  30.0 - 36.0 g/dL   RDW 40.9 (*) 81.1 - 91.4 %   Platelets 108 (*) 150 - 400 K/uL   Comment: REPEATED TO VERIFY     DELTA CHECK NOTED  CBC     Status: Abnormal   Collection Time    11/06/12  6:10 AM      Result Value Range   WBC 6.5  4.0 - 10.5 K/uL   RBC 2.71 (*) 3.87 - 5.11 MIL/uL   Hemoglobin 8.4 (*) 12.0 - 15.0 g/dL  HCT 25.7 (*) 36.0 - 46.0 %   MCV 94.8  78.0 - 100.0 fL   MCH 31.0  26.0 - 34.0 pg   MCHC 32.7  30.0 - 36.0 g/dL   RDW 16.1 (*) 09.6 - 04.5 %   Platelets 93 (*) 150 - 400 K/uL   Comment: SPECIMEN CHECKED FOR CLOTS     REPEATED TO VERIFY  BASIC METABOLIC PANEL     Status: Abnormal   Collection Time    11/06/12  6:10 AM      Result Value Range   Sodium 139  135 - 145 mEq/L   Potassium 3.6  3.5 - 5.1 mEq/L   Chloride 108  96 - 112 mEq/L   CO2 24  19 - 32 mEq/L   Glucose, Bld 81  70 - 99 mg/dL   BUN 12  6 - 23 mg/dL   Creatinine, Ser 4.09  0.50 - 1.10 mg/dL    Calcium 8.6  8.4 - 81.1 mg/dL   GFR calc non Af Amer 55 (*) >90 mL/min   GFR calc Af Amer 64 (*) >90 mL/min   Comment:            The eGFR has been calculated     using the CKD EPI equation.     This calculation has not been     validated in all clinical     situations.     eGFR's persistently     <90 mL/min signify     possible Chronic Kidney Disease.  GLUCOSE, CAPILLARY     Status: Abnormal   Collection Time    11/06/12  8:03 AM      Result Value Range   Glucose-Capillary 69 (*) 70 - 99 mg/dL  PREPARE RBC (CROSSMATCH)     Status: None   Collection Time    11/06/12  9:00 AM      Result Value Range   Order Confirmation ORDER PROCESSED BY BLOOD BANK        Component Value Date/Time   SDES URINE, RANDOM 11/04/2012 0735   SPECREQUEST NONE 11/04/2012 0735   CULT NO GROWTH 11/04/2012 0735   REPTSTATUS 11/05/2012 FINAL 11/04/2012 0735   No results found. Recent Results (from the past 240 hour(s))  CULTURE, BLOOD (ROUTINE X 2)     Status: None   Collection Time    11/03/12  3:00 PM      Result Value Range Status   Specimen Description BLOOD RIGHT PORT   Final   Special Requests BOTTLES DRAWN AEROBIC ONLY   Final   Culture  Setup Time 11/03/2012 22:56   Final   Culture     Final   Value:        BLOOD CULTURE RECEIVED NO GROWTH TO DATE CULTURE WILL BE HELD FOR 5 DAYS BEFORE ISSUING A FINAL NEGATIVE REPORT   Report Status PENDING   Incomplete  CULTURE, BLOOD (ROUTINE X 2)     Status: None   Collection Time    11/03/12  3:08 PM      Result Value Range Status   Specimen Description BLOOD LEFT ARM   Final   Special Requests BOTTLES DRAWN AEROBIC AND ANAEROBIC   Final   Culture  Setup Time 11/03/2012 22:57   Final   Culture     Final   Value:        BLOOD CULTURE RECEIVED NO GROWTH TO DATE CULTURE WILL BE HELD FOR 5 DAYS BEFORE ISSUING A FINAL NEGATIVE  REPORT   Report Status PENDING   Incomplete  URINE CULTURE     Status: None   Collection Time    11/03/12  3:22 PM       Result Value Range Status   Specimen Description URINE, CLEAN CATCH   Final   Special Requests NONE   Final   Culture  Setup Time 11/04/2012 02:23   Final   Colony Count NO GROWTH   Final   Culture NO GROWTH   Final   Report Status 11/04/2012 FINAL   Final  URINE CULTURE     Status: None   Collection Time    11/04/12  7:35 AM      Result Value Range Status   Specimen Description URINE, RANDOM   Final   Special Requests NONE   Final   Culture  Setup Time 11/04/2012 16:40   Final   Colony Count NO GROWTH   Final   Culture NO GROWTH   Final   Report Status 11/05/2012 FINAL   Final      11/06/2012, 5:02 PM     LOS: 3 days

## 2012-11-06 NOTE — Consult Note (Signed)
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  Overall failure to thrive, weakness, poor po intake----Patient is now faced with decisions of future care and care needs  Consult is for review of medical treatment options, clarification of goals of care and end of life issues, disposition and options, and symptom recommendation.  This NP Lorinda Creed reviewed medical records, received report from team, assessed the patient and then meet at the patient's bedside along with her husband [name], son [name] and his wife [name], daughter [name] and   to discuss diagnosis prognosis, GOC, EOL wishes disposition and options.   A detailed discussion was had today regarding advanced directives.  Concepts specific to code status, artifical feeding and hydration, continued IV antibiotics and rehospitalization was had.  The difference between a aggressive medical intervention path  and a palliative comfort care path for this patient at this time was had.  Values and goals of care important to patient and family were attempted to be elicited.  Concept of Hospice and Palliative Care were discussed  Natural trajectory and expectations at EOL were discussed.  Questions and concerns addressed.  Hard Choices booklet left for review. Family encouraged to call with questions or concerns.  PMT will continue to support holistically.    Goals of Care: 1.  Code Status: DNR/DNI   2. Scope of Treatment: 1. Vital Signs: per  unit  2. Respiratory/Oxygen: as needed for treatment 3. Nutritional Support/Tube Feeds: undecided 4. Antibiotics:yes 5. Blood Products:yes 6. IVF:yes 7. Review of Medications to be discontinued:continue present mediation.  Convert IV medications to oral in anticipation of ultimately dc home  8. Labs:yes 9. Telemetry:asneeded 10. Consults: Infectious disease and IR to evaluate options regarding abscess   4. Disposition: Hopeful for dc home and open to hospice services   3. Symptom Management:    1. Pain: Begin to transition from IV to oral medications for pain management                  Morphine IR 15 mg every 4 hrs prn  2. Bowel Regimen:Colace  4. Psychosocial: Emotional support offered to patient and family at bedside.  This is a very hard situation to accept for all but they put "it in Gods hands"  5. 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  Overall failure to thrive, weakness, poor po intake  ROS: weakness, intermittent abdominal pain    PMH:  Past Medical History  Diagnosis Date  . Hypertension   . Hyperlipidemia   . GERD (gastroesophageal reflux disease)   . Depression   . Esophageal stricture 2003     [place] post esophageal dilation in 2003 performed by Dr. Marina Goodell, second esophageal dilation performed in 2006  by Dr. Marina Goodell  . Obstructive jaundice due to malignant neoplasm  February 2012     status post ERCP done by Dr. Marina Goodell with biliary sphincterotomy, bile  duct stricture brushings, biliary stent placement, performed secondary to obstructive jaundice, malignant-appearing bile duct stricture of the common hepatic duct worrisome for primary cholangiocarcinoma, status post ERCP with biliary sphincterotomy and biliary stent placement...status post biopsy -  markedly atypical  cells cons  . Rotator cuff syndrome 2010     per MRI March 2010 there is a prominent articular surface partial tear of the supraspinatus which is nearly full thickness, as well as a tear of the distal anterior supraspinatus which probably represents a full-thickness partial tear,  with moderate supraspinatus tendinopathy, moderate infraspinatus tendinopathy and prominent undersurface tear of the acromion along the coracoacromial ligament  . Hiatal hernia   . SOB (shortness of breath) on exertion   . Bile duct adenocarcinoma   . Myocardial infarction   . Angina   . Headache   . Arthritis   . Dysrhythmia   . Peripheral vascular disease   . Anemia   . Diarrhea   . Nausea   . Cholangiocarcinoma   . Complication of anesthesia   . Pneumonia 08/13/2012     PSH: Past Surgical History  Procedure Laterality Date  . Cholecystectomy   2007  .  aortogram    August 2011     ultrasound-guided access to the right common femoral artery, an aortogram with bilateral iliac arteriogram and bilateral lower extremity runoff, this was performed secondary to progressive bilateral lower extremity claudication, left common iliac artery occlusion as well as short segment of right superficial femoral artery occlusion, procedure performed by Dr. Durwin Nora  . Bypass graft   January 2009     of the infected femoral-femoral graft with vein patch angioplasty of bilateral femoral artery, 7 by [name],   .  abdominal aortic angiography   September 2008     performed by Dr. 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 Cardiac catheterization   August 2011     patent coronary arteries with diffuse nonobstructive disease, there are no areas of high grade stenosis present,  determined the chest pain is most likely noncardiac in origin, continue  medical management.  . Bile duct surgery      60% of liver removed  . Arthroscopy knee w/ drilling    . Esophagogastroduodenoscopy  07/10/2011    Procedure: ESOPHAGOGASTRODUODENOSCOPY (EGD);  Surgeon: Yancey Flemings, MD;  Location: [facility];  Service: Endoscopy;  Laterality: N/A;  . Liver resection      section of liver resceted due to bile duct ca  . Esophagogastroduodenoscopy  07/20/2011    Procedure: ESOPHAGOGASTRODUODENOSCOPY (EGD);  Surgeon: Erick Blinks, MD;  Location: [facility];  Service: Gastroenterology;  Laterality: N/A;  . Esophagogastroduodenoscopy  07/31/2011    Procedure: ESOPHAGOGASTRODUODENOSCOPY (EGD);  Surgeon: Yancey Flemings, MD;  Location: [facility];  Service: Endoscopy;  Laterality: N/A;  . Mass excision  03/05/2012    Procedure: EXCISION MASS;  Surgeon: Adolph Pollack, MD;  Location: [facility] OR;  Service: General;  Laterality: N/A;  Right chest wall   I have reviewed the FH and SH and  If appropriate update it with new information. Allergies  Allergen Reactions  . Promethazine Hcl Anxiety and Other (See Comments)    Change in LOC  . Sulfonamide Derivatives Hives    All over the body   Scheduled Meds: . B-complex with vitamin C  1 tablet Oral Daily  . docusate  sodium  200 mg Oral Daily  . feeding supplement  237 mL Oral BID BM  . gabapentin  300 mg Oral TID  . metoprolol succinate  100 mg Oral q morning - 10a  . mirtazapine  15 mg Oral QHS  . pantoprazole  40 mg Oral Daily  . piperacillin-tazobactam (ZOSYN)  IV  3.375 g Intravenous Q8H  . polyethylene glycol  17 g Oral Daily   Continuous Infusions:  PRN Meds:.acetaminophen, acetaminophen, HYDROmorphone (DILAUDID) injection, LORazepam, LORazepam, morphine, prochlorperazine    BP 97/74  Pulse 83  Temp(Src) 98.5 F (36.9 C) (Oral)  Resp 18  Ht 5\' 3"  (1.6 m)  Wt 65.6 kg (144 lb 10 oz)  BMI 25.63 kg/m2  SpO2 99%   PPS:40 %    Intake/Output Summary (Last 24 hours) at 11/06/12 1457 Last data filed at  11/06/12 1339  Gross per 24 hour  Intake  797.5 ml  Output    703 ml  Net   94.5 ml   LBM: 11-06-12 X3                      Stool Softner: Colace 200 mg daily  Physical Exam:  General: ill appearing, NAD HEENT:  Moist mucous membranes Chest:   Diminished in bases CVS: RRR Abdomen: slightly distended, soft NT +BS Ext:  Trace BLE edema Neuro: alert and oriented X3, weakness  Labs: CBC    Component Value Date/Time   WBC 6.5 11/06/2012 0610   WBC 6.4 10/09/2012 1056   RBC 2.71* 11/06/2012 0610   HGB 8.4* 11/06/2012 0610   HGB 9.7* 10/09/2012 1056   HCT 25.7* 11/06/2012 0610   HCT 30.7* 10/09/2012 1056   PLT 93* 11/06/2012 0610   PLT 150 Large platelets present 10/09/2012 1056   MCV 94.8 11/06/2012 0610   MCV 96 10/09/2012 1056   MCH 31.0 11/06/2012 0610   MCH 30.4 10/09/2012 1056   MCHC 32.7 11/06/2012 0610   MCHC 31.6* 10/09/2012 1056   RDW 16.0* 11/06/2012 0610   RDW 15.6 10/09/2012 1056   LYMPHSABS 0.6* 11/03/2012 2040   LYMPHSABS 0.7* 10/09/2012 1056   MONOABS 1.1* 11/03/2012 2040   EOSABS 0.0 11/03/2012 2040   EOSABS 0.1 10/09/2012 1056   BASOSABS 0.0 11/03/2012 2040   BASOSABS 0.0 10/09/2012 1056    BMET    Component Value Date/Time   NA 139 11/06/2012 0610   NA 135 09/03/2012 0928   K 3.6 11/06/2012 0610   K 2.9* 09/03/2012 0928   CL 108 11/06/2012 0610   CL 103 09/03/2012 0928   CO2 24 11/06/2012 0610   CO2 29 09/03/2012 0928   GLUCOSE 81 11/06/2012 0610   GLUCOSE 123* 09/03/2012 0928   BUN 12 11/06/2012 0610   BUN 16 09/03/2012 0928   CREATININE 1.07 11/06/2012 0610   CREATININE 0.8 09/03/2012 0928   CALCIUM 8.6 11/06/2012 0610   CALCIUM 8.8 09/03/2012 0928   GFRNONAA 55* 11/06/2012 0610   GFRAA 64* 11/06/2012 0610    CMP     Component Value Date/Time   NA 139 11/06/2012 0610   NA 135 09/03/2012 0928   K 3.6 11/06/2012 0610   K 2.9* 09/03/2012 0928   CL 108 11/06/2012 0610   CL 103 09/03/2012 0928   CO2 24 11/06/2012 0610   CO2 29 09/03/2012 0928   GLUCOSE 81 11/06/2012 0610    GLUCOSE 123* 09/03/2012 0928   BUN 12 11/06/2012 0610   BUN 16 09/03/2012 4098  CREATININE 1.07 11/06/2012 0610   CREATININE 0.8 09/03/2012 0928   CALCIUM 8.6 11/06/2012 0610   CALCIUM 8.8 09/03/2012 0928   PROT 6.2 11/04/2012 0510   PROT 6.3* 09/03/2012 0928   ALBUMIN 2.5* 11/04/2012 0510   AST 29 11/04/2012 0510   AST 20 09/03/2012 0928   ALT 28 11/04/2012 0510   ALKPHOS 119* 11/04/2012 0510   ALKPHOS 90* 09/03/2012 0928   BILITOT 1.0 11/04/2012 0510   BILITOT 0.90 09/03/2012 0928   GFRNONAA 55* 11/06/2012 0610   GFRAA 64* 11/06/2012 0610      Time In Time Out Total Time Spent with Patient Total Overall Time  1400 1530 80 min 90 min    Greater than 50%  of this time was spent counseling and coordinating care related to the above assessment and plan.   Lorinda Creed NP  Palliative Medicine Team Team Phone # [phone] Pager [phone]   Discussed above with Dr Darnelle Catalan

## 2012-11-06 NOTE — Progress Notes (Signed)
ANTIBIOTIC CONSULT NOTE - INITIAL  Pharmacy Consult for Zosyn Indication: abscess  Allergies  Allergen Reactions  . Promethazine Hcl Anxiety and Other (See Comments)    Change in LOC  . Sulfonamide Derivatives Hives    All over the body    Patient Measurements: Height: 5\' 3"  (160 cm) Weight: 144 lb 10 oz (65.6 kg) IBW/kg (Calculated) : 52.4  Vital Signs: Temp: 98.5 F (36.9 C) (03/28 1215) Temp src: Oral (03/28 1215) BP: 95/63 mmHg (03/28 1215) Pulse Rate: 68 (03/28 1215) Intake/Output from previous day: 03/27 0701 - 03/28 0700 In: 540 [P.O.:240; IV Piggyback:300] Out: 352 [Urine:350; Stool:2] Intake/Output from this shift: Total I/O In: 377.5 [P.O.:240; Blood:137.5] Out: 401 [Urine:400; Stool:1]  Labs:  Recent Labs  11/03/12 2040 11/04/12 0510 11/05/12 1035 11/06/12 0610  WBC 9.3 8.8 9.9 6.5  HGB 8.3* 8.9* 9.7* 8.4*  PLT 68* 70* 108* 93*  CREATININE 0.79 0.85  --  1.07   Estimated Creatinine Clearance: 50.9 ml/min (by C-G formula based on Cr of 1.07). No results found for this basename: VANCOTROUGH, VANCOPEAK, VANCORANDOM, GENTTROUGH, GENTPEAK, GENTRANDOM, TOBRATROUGH, TOBRAPEAK, TOBRARND, AMIKACINPEAK, AMIKACINTROU, AMIKACIN,  in the last 72 hours   Microbiology: Recent Results (from the past 720 hour(s))  CULTURE, BLOOD (ROUTINE X 2)     Status: None   Collection Time    11/03/12  3:00 PM      Result Value Range Status   Specimen Description BLOOD RIGHT PORT   Final   Special Requests BOTTLES DRAWN AEROBIC ONLY   Final   Culture  Setup Time 11/03/2012 22:56   Final   Culture     Final   Value:        BLOOD CULTURE RECEIVED NO GROWTH TO DATE CULTURE WILL BE HELD FOR 5 DAYS BEFORE ISSUING A FINAL NEGATIVE REPORT   Report Status PENDING   Incomplete  CULTURE, BLOOD (ROUTINE X 2)     Status: None   Collection Time    11/03/12  3:08 PM      Result Value Range Status   Specimen Description BLOOD LEFT ARM   Final   Special Requests BOTTLES DRAWN  AEROBIC AND ANAEROBIC   Final   Culture  Setup Time 11/03/2012 22:57   Final   Culture     Final   Value:        BLOOD CULTURE RECEIVED NO GROWTH TO DATE CULTURE WILL BE HELD FOR 5 DAYS BEFORE ISSUING A FINAL NEGATIVE REPORT   Report Status PENDING   Incomplete  URINE CULTURE     Status: None   Collection Time    11/03/12  3:22 PM      Result Value Range Status   Specimen Description URINE, CLEAN CATCH   Final   Special Requests NONE   Final   Culture  Setup Time 11/04/2012 02:23   Final   Colony Count NO GROWTH   Final   Culture NO GROWTH   Final   Report Status 11/04/2012 FINAL   Final  URINE CULTURE     Status: None   Collection Time    11/04/12  7:35 AM      Result Value Range Status   Specimen Description URINE, RANDOM   Final   Special Requests NONE   Final   Culture  Setup Time 11/04/2012 16:40   Final   Colony Count NO GROWTH   Final   Culture NO GROWTH   Final   Report Status 11/05/2012 FINAL  Final    Medical History: Past Medical History  Diagnosis Date  . Hypertension   . Hyperlipidemia   . GERD (gastroesophageal reflux disease)   . Depression   . Esophageal stricture 2003     [place] post esophageal dilation in 2003 performed by Dr. Marina Goodell, second esophageal dilation performed in 2006  by Dr. Marina Goodell  . Obstructive jaundice due to malignant neoplasm  February 2012     status post ERCP done by Dr. Marina Goodell with biliary sphincterotomy, bile duct stricture brushings, biliary stent placement, performed secondary to obstructive jaundice, malignant-appearing bile duct stricture of the common hepatic duct worrisome for primary cholangiocarcinoma, status post ERCP with biliary sphincterotomy and biliary stent placement...status post biopsy -  markedly atypical cells cons  . Rotator cuff syndrome 2010     per MRI March 2010 there is a prominent articular surface partial tear of the supraspinatus which is nearly full thickness, as well as a tear of the distal anterior  supraspinatus which probably represents a full-thickness partial tear,  with moderate supraspinatus tendinopathy, moderate infraspinatus tendinopathy and prominent undersurface tear of the acromion along the coracoacromial ligament  . Hiatal hernia   . SOB (shortness of breath) on exertion   . Bile duct adenocarcinoma   . Myocardial infarction   . Angina   . Headache   . Arthritis   . Dysrhythmia   . Peripheral vascular disease   . Anemia   . Diarrhea   . Nausea   . Cholangiocarcinoma   . Complication of anesthesia   . Pneumonia 08/13/2012    Medications:  Scheduled:  . B-complex with vitamin C  1 tablet Oral Daily  . docusate sodium  200 mg Oral Daily  . feeding supplement  237 mL Oral BID BM  . gabapentin  300 mg Oral TID  . metoprolol succinate  100 mg Oral q morning - 10a  . mirtazapine  15 mg Oral QHS  . pantoprazole  40 mg Oral Daily  . polyethylene glycol  17 g Oral Daily  . [DISCONTINUED] ceFEPime (MAXIPIME) IV  1 g Intravenous Q8H  . [DISCONTINUED] metronidazole  500 mg Intravenous Q8H  . [DISCONTINUED] vancomycin  750 mg Intravenous Q12H   Infusions:  . [DISCONTINUED] sodium chloride 50 mL/hr at 11/05/12 1610   Assessment:  62 yo female with PMH metastatic cholangiocarcinoma with likely a liver abscess that was started on vanc/cefepime that was d/c'd yesterday and Flagyl continued  Now d/c'ing Flagyl and starting Zosyn per Md orders  Goal of Therapy:  eradication of infection  Plan:  Zosyn 3.375g IV q8 (extended interval infusion) for CrCl > 20 ml/min   Hessie Knows, PharmD, BCPS Pager [phone] 11/06/2012 1:06 PM

## 2012-11-06 NOTE — Progress Notes (Signed)
Mrs.Phegley is resting well this morning. I very much appreciate palliative care seeing her. They'll have a formal meeting today.   Her prealbumin is now down to 5. I think this is incredibly prognostic.  Hospice clearly is indicated. We can get this arranged when she is discharged.  Her hemoglobin is 8.4. I really believe that a transfusion, before she would be to be discharged would be a good idea for her. Again this could help give her a little bit more energy. She has bad peripheral vascular disease in which becomes anemic, she begins to have more issues with her circulation in her legs and is causing her more problems.  I'm not sure that she see you know that much. Again I would not force this.  She is doing some physical therapy. This could certainly be helpful once she's an outpatient.   I do appreciate nutritional service is seeing her and try to help maximize her caloric intake.  Again, this is all about comfort care this point. Her prealbumin, again, is very prognostic.  I will write for the transfusion.  Hewitt Shorts  Psalm 34:18

## 2012-11-06 NOTE — Progress Notes (Signed)
[facility] PROGRESS NOTE  [name] ZOX:[id number] DOB: 07-Jun-1951 DOA: 11/03/2012 PCP: Genella Mech, MD  Brief narrative: [name] is an 62 y.o. 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  She also has a PMH of metastasis to lungs (was on weekly Abraxane and per oncology is not considered to be a further candidate for systemic chemo, last chemo in February, 2014), dyslipidemia, GERD and depression who presented to [facility] ED 11/03/2012 with worsening epigastric abdominal pain over past few weeks prior to this admission associated with nausea and vomiting and poor oral intake. In ED, evaluation included CT abdomen/pelvis which revealed persistent but smaller subdiaphragmatic abscess on the right just posterior to the liver, 5 x 5 cm. CT angio chest ruled out pulmonary embolism but it did show progressive pulmonary metastatic disease and stable right pleural effusion. CBC revealed mild leukocytosis of 11.1, hemoglobin of 9.6 (which is patient's baseline) and platelet count of 80 (which is lower than patient's baseline with normal platelet count). Patient was started on vanco, cefepime and flagyl in ED.  The palliative care team is consulting on the patient with plans to have a goals of care meeting with her and her family today.   Assessment/Plan: Principal Problem:  Fever and abdominal pain / Liver abscess -Lkely from liver abscess noted on CT abdomen. No obvious source of infection in lungs and urinalysis was unremarkable. -Lactic acid was WNL. Procalcitonin level only mildly elevated 0.4.  -Initially started on vancomycin and cefepime as well as Flagyl. Vancomycin and cefepime subsequently discontinued 11/05/2012.  At this point, if coverage for liver abscess is warranted, best to use Zosyn. Will discontinue Flagyl and start Zosyn while awaiting goals of care meeting. -Apparently has been evaluated by interventional radiology for consideration of CT-guided aspiration, but this has not been recommended. Active Problems:  HYPERTENSION  -Continue metoprolol. GERD  -Continue Protonix. 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 Myna Hidalgo.  Poor performance status precludes any further chemotherapy. -For a palliative care meeting with goals of care today. Leukocytosis  -Resolved.  Anemia of chronic disease  -Given 2 units of packed red blood cells on 11/06/2012 by the patient's oncologist. Thrombocytopenia  -Platelets stable. Hypokalemia  -Monitor and replace as needed. Severe protein calorie malnutrition  -Seen by dietician 11/05/12, continue Ensure supplements.  Code Status: DNR/DNI  Family Communication: Updated family at bedside  Disposition Plan: Home when stable   Medical Consultants:  Dr. Arlan Organ, Oncology.  Palliative care team  Other Consultants:  Physical therapy: Home health PT to  Anti-infectives:  Vancomycin 11/03/2012---> 11/05/2012  Cefepime 11/03/2012---> 11/05/2012  Flagyl 11/03/2012---> 11/06/2012  Zosyn 11/06/2012--->  HPI/Subjective: [name] is  Objective: Filed Vitals:   11/05/12 2100 11/05/12 2105 11/06/12 0603 11/06/12 1145  BP: 82/62 100/60 138/74 122/68  Pulse: 81  68 80  Temp: 99 F (37.2 C)  97.6 F (36.4 C) 98 F (36.7 C)  TempSrc: Oral  Oral Oral  Resp: 18  20 20   Height:      Weight:   65.6 kg (144 lb 10 oz)   SpO2: 98%  100%     Intake/Output Summary (Last 24 hours) at 11/06/12 1225 Last data filed at 11/06/12 1201  Gross per  24 hour  Intake  312.5 ml  Output      1 ml  Net  311.5 ml    Exam: Gen:  NAD Cardiovascular:  RRR, No M/R/G Respiratory:  Lungs CTAB Gastrointestinal:  Abdomen soft, tenderness RUQ, +  BS Extremities:  No C/E/C  Data Reviewed: Basic Metabolic Panel:  Recent Labs Lab 11/03/12 1500 11/03/12 2040 11/04/12 0510 11/06/12 0610  NA 135 131* 136 139  K 3.3* 3.7 3.8 3.6  CL 100 100 104 108  CO2 25 24 24 24   GLUCOSE 101* 96 98 81  BUN 15 12 12 12   CREATININE 0.85 0.79 0.85 1.07  CALCIUM 9.1 8.6 9.1 8.6  MG  --  1.7  --   --   PHOS  --  2.5  --   --    GFR Estimated Creatinine Clearance: 50.9 ml/min (by C-G formula based on Cr of 1.07). Liver Function Tests:  Recent Labs Lab 11/03/12 1500 11/03/12 2040 11/04/12 0510  AST 43* 34 29  ALT 34 29 28  ALKPHOS 133* 121* 119*  BILITOT 1.1 1.0 1.0  PROT 7.2 6.3 6.2  ALBUMIN 2.8* 2.4* 2.5*   Coagulation profile  Recent Labs Lab 11/03/12 1500 11/03/12 2040  INR 1.29 1.41    CBC:  Recent Labs Lab 11/03/12 1500 11/03/12 2040 11/04/12 0510 11/05/12 1035 11/06/12 0610  WBC 11.1* 9.3 8.8 9.9 6.5  NEUTROABS 9.4* 7.5  --   --   --   HGB 9.6* 8.3* 8.9* 9.7* 8.4*  HCT 28.4* 25.1* 26.3* 29.1* 25.7*  MCV 93.1 93.3 93.9 94.5 94.8  PLT 80* 68* 70* 108* 93*   CBG:  Recent Labs Lab 11/04/12 0748 11/04/12 1709 11/05/12 0756 11/06/12 0803  GLUCAP 99 106* 85 69*   Microbiology Recent Results (from the past 240 hour(s))  CULTURE, BLOOD (ROUTINE X 2)     Status: None   Collection Time    11/03/12  3:00 PM      Result Value Range Status   Specimen Description BLOOD RIGHT PORT   Final   Special Requests BOTTLES DRAWN AEROBIC ONLY   Final   Culture  Setup Time 11/03/2012 22:56   Final   Culture     Final   Value:        BLOOD CULTURE RECEIVED NO GROWTH TO DATE CULTURE WILL BE HELD FOR 5 DAYS BEFORE ISSUING A FINAL NEGATIVE REPORT   Report Status PENDING   Incomplete  CULTURE, BLOOD (ROUTINE X 2)     Status: None   Collection Time    11/03/12  3:08 PM      Result Value Range Status   Specimen Description BLOOD LEFT ARM   Final   Special Requests BOTTLES DRAWN AEROBIC AND ANAEROBIC   Final    Culture  Setup Time 11/03/2012 22:57   Final   Culture     Final   Value:        BLOOD CULTURE RECEIVED NO GROWTH TO DATE CULTURE WILL BE HELD FOR 5 DAYS BEFORE ISSUING A FINAL NEGATIVE REPORT   Report Status PENDING   Incomplete  URINE CULTURE     Status: None   Collection Time    11/03/12  3:22 PM      Result Value Range Status   Specimen Description URINE, CLEAN CATCH   Final   Special Requests NONE   Final   Culture  Setup Time 11/04/2012 02:23   Final   Colony Count NO GROWTH  Final   Culture NO GROWTH   Final   Report Status 11/04/2012 FINAL   Final  URINE CULTURE     Status: None   Collection Time    11/04/12  7:35 AM      Result Value Range Status   Specimen Description URINE, RANDOM   Final   Special Requests NONE   Final   Culture  Setup Time 11/04/2012 16:40   Final   Colony Count NO GROWTH   Final   Culture NO GROWTH   Final   Report Status 11/05/2012 FINAL   Final     Procedures and Diagnostic Studies:  Ct Angio Chest Pe W/cm &/or Wo Cm 11/03/2012  IMPRESSION:  1.  No CT findings for pulmonary embolism. 2.  Mild fusiform enlargement of the ascending aorta.  No focal aneurysm or dissection. 3.  Enlargement of the main pulmonary artery suggesting pulmonary hypertension. 4.  Extensive coronary artery calcifications, advanced for age. 5.  Progressive pulmonary metastatic disease. 6.  Stable right pleural effusion with overlying atelectasis. Original Report Authenticated By: Rudie Meyer, M.D.     Ct Abdomen Pelvis W Contrast 11/03/2012  IMPRESSION:  1. Slight decrease in size of the subdiaphragmatic fluid collection with air, likely a contracting abscess. 2.  Stable surgical changes involving the liver with ill-defined low attenuation material surrounding the portal vein and portal structures. 3.  No other significant abdominal/pelvic findings.   Original Report Authenticated By: Rudie Meyer, M.D.     Dg Abd Acute W/chest 11/03/2012  IMPRESSION: Scattered small pulmonary  nodules/opacities, corresponding to metastases on CT.  Small right pleural effusion.  Lucency beneath the medial right lung base, favored to reflect gas within a subdiaphragmatic abscess when correlating with CT.  No evidence of bowel obstruction or free air.   Original Report Authenticated By: Charline Bills, M.D.     Scheduled Meds: . B-complex with vitamin C  1 tablet Oral Daily  . docusate sodium  200 mg Oral Daily  . feeding supplement  237 mL Oral BID BM  . gabapentin  300 mg Oral TID  . metoprolol succinate  100 mg Oral q morning - 10a  . metronidazole  500 mg Intravenous Q8H  . mirtazapine  15 mg Oral QHS  . pantoprazole  40 mg Oral Daily  . polyethylene glycol  17 g Oral Daily   Continuous Infusions:   Time spent: 35 minutes.   LOS: 3 days   RAMA,CHRISTINA  [facility] Pager [phone].  If 8PM-8AM, please contact night-coverage at [email or web address], password [facility] 11/06/2012, 12:25 PM

## 2012-11-07 LAB — PROCALCITONIN: Procalcitonin: 0.42 ng/mL

## 2012-11-07 LAB — TYPE AND SCREEN: Unit division: 0

## 2012-11-07 LAB — CBC
MCH: 30.6 pg (ref 26.0–34.0)
MCHC: 34 g/dL (ref 30.0–36.0)
Platelets: 98 10*3/uL — ABNORMAL LOW (ref 150–400)
RDW: 17.8 % — ABNORMAL HIGH (ref 11.5–15.5)

## 2012-11-07 LAB — GLUCOSE, CAPILLARY: Glucose-Capillary: 81 mg/dL (ref 70–99)

## 2012-11-07 NOTE — Progress Notes (Signed)
Progress Note from the Palliative Medicine Team at [facility]  Subjective: patient is  Alert and oriented, husband at bedside  -f/u yesterdays GOC meeting, family express appreciation, verbalizes continued interest in hospice services     Objective: Allergies  Allergen Reactions  . Promethazine Hcl Anxiety and Other (See Comments)    Change in LOC  . Sulfonamide Derivatives Hives    All over the body   Scheduled Meds: . B-complex with vitamin C  1 tablet Oral Daily  . docusate sodium  200 mg Oral Daily  . feeding supplement  237 mL Oral BID BM  . gabapentin  300 mg Oral TID  . metoprolol succinate  100 mg Oral q morning - 10a  . mirtazapine  15 mg Oral QHS  . pantoprazole  40 mg Oral Daily  . piperacillin-tazobactam (ZOSYN)  IV  3.375 g Intravenous Q8H  . polyethylene glycol  17 g Oral Daily   Continuous Infusions:  PRN Meds:.acetaminophen, acetaminophen, HYDROmorphone (DILAUDID) injection, LORazepam, LORazepam, morphine, prochlorperazine  BP 101/61  Pulse 68  Temp(Src) 99 F (37.2 C) (Oral)  Resp 16  Ht 5\' 3"  (1.6 m)  Wt 65.2 kg (143 lb 11.8 oz)  BMI 25.47 kg/m2  SpO2 100%   PPS:40 %  Pain Score:denies presently    Intake/Output Summary (Last 24 hours) at 11/07/12 1037 Last data filed at 11/07/12 0504  Gross per 24 hour  Intake 1557.5 ml  Output   1052 ml  Net  505.5 ml      LBM: 11-07-12   Physical Exam: General: ill appearing, NAD  HEENT: Moist mucous membranes  Chest: Diminished in bases  CVS: RRR  Abdomen: slightly distended, soft NT +BS  Ext: Trace BLE edema  Neuro: alert and oriented X3, weakness   Labs: CBC    Component Value Date/Time   WBC 6.7 11/07/2012 0510   WBC 6.4 10/09/2012 1056   RBC 3.46* 11/07/2012 0510   HGB 10.6* 11/07/2012 0510   HGB 9.7* 10/09/2012 1056   HCT 31.2* 11/07/2012 0510   HCT 30.7* 10/09/2012 1056   PLT 98* 11/07/2012 0510   PLT 150 Large platelets present 10/09/2012 1056   MCV 90.2 11/07/2012 0510   MCV 96 10/09/2012  1056   MCH 30.6 11/07/2012 0510   MCH 30.4 10/09/2012 1056   MCHC 34.0 11/07/2012 0510   MCHC 31.6* 10/09/2012 1056   RDW 17.8* 11/07/2012 0510   RDW 15.6 10/09/2012 1056   LYMPHSABS 0.6* 11/03/2012 2040   LYMPHSABS 0.7* 10/09/2012 1056   MONOABS 1.1* 11/03/2012 2040   EOSABS 0.0 11/03/2012 2040   EOSABS 0.1 10/09/2012 1056   BASOSABS 0.0 11/03/2012 2040   BASOSABS 0.0 10/09/2012 1056    BMET    Component Value Date/Time   NA 139 11/06/2012 0610   NA 135 09/03/2012 0928   K 3.6 11/06/2012 0610   K 2.9* 09/03/2012 0928   CL 108 11/06/2012 0610   CL 103 09/03/2012 0928   CO2 24 11/06/2012 0610   CO2 29 09/03/2012 0928   GLUCOSE 81 11/06/2012 0610   GLUCOSE 123* 09/03/2012 0928   BUN 12 11/06/2012 0610   BUN 16 09/03/2012 0928   CREATININE 1.07 11/06/2012 0610   CREATININE 0.8 09/03/2012 0928   CALCIUM 8.6 11/06/2012 0610   CALCIUM 8.8 09/03/2012 0928   GFRNONAA 55* 11/06/2012 0610   GFRAA 64* 11/06/2012 0610    CMP     Component Value Date/Time   NA 139 11/06/2012 0610   NA  135 09/03/2012 0928   K 3.6 11/06/2012 0610   K 2.9* 09/03/2012 0928   CL 108 11/06/2012 0610   CL 103 09/03/2012 0928   CO2 24 11/06/2012 0610   CO2 29 09/03/2012 0928   GLUCOSE 81 11/06/2012 0610   GLUCOSE 123* 09/03/2012 0928   BUN 12 11/06/2012 0610   BUN 16 09/03/2012 0928   CREATININE 1.07 11/06/2012 0610   CREATININE 0.8 09/03/2012 0928   CALCIUM 8.6 11/06/2012 0610   CALCIUM 8.8 09/03/2012 0928   PROT 6.2 11/04/2012 0510   PROT 6.3* 09/03/2012 0928   ALBUMIN 2.5* 11/04/2012 0510   AST 29 11/04/2012 0510   AST 20 09/03/2012 0928   ALT 28 11/04/2012 0510   ALKPHOS 119* 11/04/2012 0510   ALKPHOS 90* 09/03/2012 0928   BILITOT 1.0 11/04/2012 0510   BILITOT 0.90 09/03/2012 0928   GFRNONAA 55* 11/06/2012 0610   GFRAA 64* 11/06/2012 0610     Assessment and Plan: 1. Code Status:DNR/DNI 2. Symptom Control: Morphine IR 15 mg every 3 hrs prn 3. Psycho/Social: Emotional support offered to patient and husband at bedside, questions and  concerns addressed 4. Spiritual strong community church support 5. Disposition: Hopeful to dc home "soon", with hospice services, will write for choice.    Lorinda Creed NP  Palliative Medicine Team Team Phone # [phone] Pager [phone]   1

## 2012-11-07 NOTE — Care Management (Signed)
Cm spoke with patient with adult daughter and two daughter-n-laws present at bedside concerning Cm consult for Home Hospice choice. Cm provided pt with list of Home Hospice agencies in [place]. No choice made at this time. Pt states has DME for home use but request BSC. Pt informed once choice of agency made, agency rep will discuss equipment packages with pt. No other needs stated at this time.  Roxy Manns Kimbree Casanas,RN,BSN [phone]

## 2012-11-07 NOTE — Progress Notes (Addendum)
[facility] PROGRESS NOTE  CHRYSTIE HAGWOOD ZOX:[id number] DOB: [birth year] DOA: 11/03/2012 PCP: Genella Mech, MD  Brief narrative: DENENE ALAMILLO is an 62 y.o. 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  She also has a PMH of metastasis to lungs (was on weekly Abraxane and per oncology is not considered to be a further candidate for systemic chemo, last chemo in February, 2014), dyslipidemia, GERD and depression who presented to [facility] ED 11/03/2012 with worsening epigastric abdominal pain over past few weeks prior to this admission associated with nausea and vomiting and poor oral intake. In ED, evaluation included CT abdomen/pelvis which revealed persistent but smaller subdiaphragmatic abscess on the right just posterior to the liver, 5 x 5 cm. CT angio chest ruled out pulmonary embolism but it did show progressive pulmonary metastatic disease and stable right pleural effusion. CBC revealed mild leukocytosis of 11.1, hemoglobin of 9.6 (which is patient's baseline) and platelet count of 80 (which is lower than patient's baseline with normal platelet count). Patient was started on vanco, cefepime and flagyl in ED.  The palliative care team is consulting on the patient with plans to have a goals of care meeting with her and her family today.   Assessment/Plan: Principal Problem:  Fever and abdominal pain / Liver abscess -Lkely from liver abscess noted on CT abdomen. No obvious source of infection in lungs and urinalysis was unremarkable.  -Lactic acid was WNL. Procalcitonin level only mildly elevated 0.4.  -Apparently has been evaluated by interventional radiology for consideration of CT-guided aspiration, but this has not been  recommended.   -Asked for formal ID consultation to evaluate most effective treatment option. Aspiration of abscess not currently recommended. It is recommended, however, that the patient be sent home on antibiotic therapy. -Continue Zosyn. Send home on antibiotics per ID recommendations. Active Problems:  HYPERTENSION  -Continue metoprolol. GERD  -Continue Protonix. 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 Myna Hidalgo.  Poor performance status precludes any further chemotherapy. -Status post palliative care meeting with goals of care 11/06/2012. Leukocytosis  -Resolved.  Anemia of chronic disease  -Given 2 units of packed red blood cells on 11/06/2012 by the patient's oncologist. Symptomatically improved. Thrombocytopenia  -Platelets stable. Hypokalemia  -Monitor and replace as needed. Severe protein calorie malnutrition  -Seen by dietician 11/05/12, continue Ensure supplements.  Code Status: DNR/DNI  Family Communication: Updated family at bedside  Disposition Plan: Home when stable   Medical Consultants:  Dr. Arlan Organ, Oncology.  Palliative care team  Other Consultants:  Physical therapy: Home health PT.  Anti-infectives:  Vancomycin 11/03/2012---> 11/05/2012  Cefepime 11/03/2012---> 11/05/2012  Flagyl 11/03/2012---> 11/06/2012  Zosyn 11/06/2012--->  HPI/Subjective: GRAYLYN BUNNEY is still having some abdominal pain. Now is having loose stools. Has been on daily MiraLAX. Appetite fair.  Objective: Filed Vitals:   11/06/12 2015 11/06/12 2115 11/06/12 2155 11/07/12 0620  BP: 92/68 95/66 104/68 101/61  Pulse: 75 72 68 68  Temp: 99.3 F (37.4 C) 99.1 F (37.3 C) 99.6 F (37.6 C) 99 F (37.2 C)  TempSrc:    Oral  Resp: 16 18 16 16   Height:      Weight:    65.2 kg (143 lb 11.8 oz)  SpO2:  100%    Intake/Output Summary (Last 24 hours) at 11/07/12 1211 Last data filed at 11/07/12 1053  Gross per 24 hour  Intake   1785 ml  Output   1052 ml   Net    733 ml    Exam: Gen:  NAD Cardiovascular:  RRR, No M/R/G Respiratory:  Lungs CTAB Gastrointestinal:  Abdomen soft, tenderness RUQ, + BS Extremities:  No C/E/C  Data Reviewed: Basic Metabolic Panel:  Recent Labs Lab 11/03/12 1500 11/03/12 2040 11/04/12 0510 11/06/12 0610  NA 135 131* 136 139  K 3.3* 3.7 3.8 3.6  CL 100 100 104 108  CO2 25 24 24 24   GLUCOSE 101* 96 98 81  BUN 15 12 12 12   CREATININE 0.85 0.79 0.85 1.07  CALCIUM 9.1 8.6 9.1 8.6  MG  --  1.7  --   --   PHOS  --  2.5  --   --    GFR Estimated Creatinine Clearance: 50.8 ml/min (by C-G formula based on Cr of 1.07). Liver Function Tests:  Recent Labs Lab 11/03/12 1500 11/03/12 2040 11/04/12 0510  AST 43* 34 29  ALT 34 29 28  ALKPHOS 133* 121* 119*  BILITOT 1.1 1.0 1.0  PROT 7.2 6.3 6.2  ALBUMIN 2.8* 2.4* 2.5*   Coagulation profile  Recent Labs Lab 11/03/12 1500 11/03/12 2040  INR 1.29 1.41    CBC:  Recent Labs Lab 11/03/12 1500 11/03/12 2040 11/04/12 0510 11/05/12 1035 11/06/12 0610 11/07/12 0510  WBC 11.1* 9.3 8.8 9.9 6.5 6.7  NEUTROABS 9.4* 7.5  --   --   --   --   HGB 9.6* 8.3* 8.9* 9.7* 8.4* 10.6*  HCT 28.4* 25.1* 26.3* 29.1* 25.7* 31.2*  MCV 93.1 93.3 93.9 94.5 94.8 90.2  PLT 80* 68* 70* 108* 93* 98*   CBG:  Recent Labs Lab 11/04/12 0748 11/04/12 1709 11/05/12 0756 11/06/12 0803 11/07/12 0809  GLUCAP 99 106* 85 69* 81   Microbiology Recent Results (from the past 240 hour(s))  CULTURE, BLOOD (ROUTINE X 2)     Status: None   Collection Time    11/03/12  3:00 PM      Result Value Range Status   Specimen Description BLOOD RIGHT PORT   Final   Special Requests BOTTLES DRAWN AEROBIC ONLY   Final   Culture  Setup Time 11/03/2012 22:56   Final   Culture     Final   Value:        BLOOD CULTURE RECEIVED NO GROWTH TO DATE CULTURE WILL BE HELD FOR 5 DAYS BEFORE ISSUING A FINAL NEGATIVE REPORT   Report Status PENDING   Incomplete  CULTURE, BLOOD (ROUTINE X 2)      Status: None   Collection Time    11/03/12  3:08 PM      Result Value Range Status   Specimen Description BLOOD LEFT ARM   Final   Special Requests BOTTLES DRAWN AEROBIC AND ANAEROBIC   Final   Culture  Setup Time 11/03/2012 22:57   Final   Culture     Final   Value:        BLOOD CULTURE RECEIVED NO GROWTH TO DATE CULTURE WILL BE HELD FOR 5 DAYS BEFORE ISSUING A FINAL NEGATIVE REPORT   Report Status PENDING   Incomplete  URINE CULTURE     Status: None   Collection Time    11/03/12  3:22 PM      Result Value Range Status  Specimen Description URINE, CLEAN CATCH   Final   Special Requests NONE   Final   Culture  Setup Time 11/04/2012 02:23   Final   Colony Count NO GROWTH   Final   Culture NO GROWTH   Final   Report Status 11/04/2012 FINAL   Final  URINE CULTURE     Status: None   Collection Time    11/04/12  7:35 AM      Result Value Range Status   Specimen Description URINE, RANDOM   Final   Special Requests NONE   Final   Culture  Setup Time 11/04/2012 16:40   Final   Colony Count NO GROWTH   Final   Culture NO GROWTH   Final   Report Status 11/05/2012 FINAL   Final     Procedures and Diagnostic Studies:  Ct Angio Chest Pe W/cm &/or Wo Cm 11/03/2012  IMPRESSION:  1.  No CT findings for pulmonary embolism. 2.  Mild fusiform enlargement of the ascending aorta.  No focal aneurysm or dissection. 3.  Enlargement of the main pulmonary artery suggesting pulmonary hypertension. 4.  Extensive coronary artery calcifications, advanced for age. 5.  Progressive pulmonary metastatic disease. 6.  Stable right pleural effusion with overlying atelectasis. Original Report Authenticated By: Rudie Meyer, M.D.     Ct Abdomen Pelvis W Contrast 11/03/2012  IMPRESSION:  1. Slight decrease in size of the subdiaphragmatic fluid collection with air, likely a contracting abscess. 2.  Stable surgical changes involving the liver with ill-defined low attenuation material surrounding the portal vein  and portal structures. 3.  No other significant abdominal/pelvic findings.   Original Report Authenticated By: Rudie Meyer, M.D.     Dg Abd Acute W/chest 11/03/2012  IMPRESSION: Scattered small pulmonary nodules/opacities, corresponding to metastases on CT.  Small right pleural effusion.  Lucency beneath the medial right lung base, favored to reflect gas within a subdiaphragmatic abscess when correlating with CT.  No evidence of bowel obstruction or free air.   Original Report Authenticated By: Charline Bills, M.D.     Scheduled Meds: . B-complex with vitamin C  1 tablet Oral Daily  . docusate sodium  200 mg Oral Daily  . feeding supplement  237 mL Oral BID BM  . gabapentin  300 mg Oral TID  . metoprolol succinate  100 mg Oral q morning - 10a  . mirtazapine  15 mg Oral QHS  . pantoprazole  40 mg Oral Daily  . piperacillin-tazobactam (ZOSYN)  IV  3.375 g Intravenous Q8H  . polyethylene glycol  17 g Oral Daily   Continuous Infusions:   Time spent: 25 minutes.   LOS: 4 days   Kimiye Strathman  [facility] Pager [phone].  If 8PM-8AM, please contact night-coverage at [email or web address], password [facility] 11/07/2012, 12:11 PM

## 2012-11-07 NOTE — Progress Notes (Signed)
[name] feels a little better today.  The 2 units of blood seemed to help her out yesterday.  I appreciate the consult from ID.  I also agree that the attempt to aspirate the abscess is of ?benefit.  Her appetite is ok.   She is only taking pain meds 2-3 times a day.  No n/v.  Now on IV abx.  I also appreciate the Palliative Care consult.  Her exam is pretty much unchanged.  Maybe she will be able to go home soon!!!  Pete E.  Psalm 24:1

## 2012-11-08 DIAGNOSIS — A0472 Enterocolitis due to Clostridium difficile, not specified as recurrent: Secondary | ICD-10-CM

## 2012-11-08 HISTORY — DX: Enterocolitis due to Clostridium difficile, not specified as recurrent: A04.72

## 2012-11-08 LAB — CLOSTRIDIUM DIFFICILE BY PCR: Toxigenic C. Difficile by PCR: POSITIVE — AB

## 2012-11-08 LAB — GLUCOSE, CAPILLARY: Glucose-Capillary: 76 mg/dL (ref 70–99)

## 2012-11-08 MED ORDER — CIPROFLOXACIN HCL 500 MG PO TABS
500.0000 mg | ORAL_TABLET | Freq: Two times a day (BID) | ORAL | Status: DC
  Administered 2012-11-08 – 2012-11-11 (×7): 500 mg via ORAL
  Filled 2012-11-08 (×9): qty 1

## 2012-11-08 MED ORDER — METRONIDAZOLE 500 MG PO TABS
500.0000 mg | ORAL_TABLET | Freq: Three times a day (TID) | ORAL | Status: DC
Start: 2012-11-08 — End: 2012-11-11
  Administered 2012-11-08 – 2012-11-11 (×9): 500 mg via ORAL
  Filled 2012-11-08 (×12): qty 1

## 2012-11-08 NOTE — Progress Notes (Signed)
CRITICAL VALUE ALERT  Critical value received:  C. Diff positive  Date of notification: 11/08/12  Time of notification: 0912  Critical value read back:yes  Nurse who received alert:  Eugene Garnet RN  MD notified (1st page):  Dr. Darnelle Catalan  Time of first page:  [phone]  MD notified (2nd page):  Time of second page:  Responding MD:  Dr. Darnelle Catalan  Time MD responded:  [phone]

## 2012-11-08 NOTE — Progress Notes (Signed)
[facility] PROGRESS NOTE  [name] NWG:[id number] DOB: [birth year] DOA: 11/03/2012 PCP: Genella Mech, MD  Brief narrative: [name] is an 62 y.o. 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  She also has a PMH of metastasis to lungs (was on weekly Abraxane and per oncology is not considered to be a further candidate for systemic chemo, last chemo in February, 2014), dyslipidemia, GERD and depression who presented to [facility] ED 11/03/2012 with worsening epigastric abdominal pain over past few weeks prior to this admission associated with nausea and vomiting and poor oral intake. In ED, evaluation included CT abdomen/pelvis which revealed persistent but smaller subdiaphragmatic abscess on the right just posterior to the liver, 5 x 5 cm. CT angio chest ruled out pulmonary embolism but it did show progressive pulmonary metastatic disease and stable right pleural effusion. CBC revealed mild leukocytosis of 11.1, hemoglobin of 9.6 (which is patient's baseline) and platelet count of 80 (which is lower than patient's baseline with normal platelet count). Patient was started on vanco, cefepime and flagyl in ED.  The palliative care team saw the patient on 11/06/2012 to establish goals of care with plan to discharge home when stable with hospice.   Assessment/Plan: Principal Problem:  Fever and abdominal pain / Liver abscess -Lkely from liver abscess noted on CT abdomen. No obvious source of infection in lungs and urinalysis was unremarkable.  -Lactic acid was WNL. Procalcitonin level only mildly elevated 0.4.  -Apparently has been evaluated by interventional radiology for consideration of CT-guided aspiration, but this has not  been recommended.   -Asked for formal ID consultation to evaluate most effective treatment option. Aspiration of abscess not currently recommended. It is recommended, however, that the patient be sent home on antibiotic therapy. -Change soaps and to Cipro in anticipation of discharge home the next 24 hours.  Active Problems: Generalized weakness -Secondary to advanced malignancy. Maximize nutrition. Clostridium difficile diarrhea  -Patient has developed diarrhea and Clostridium difficile PCR studies were positive. -No leukocytosis or elevation of creatinine which makes this a mild case. Flagyl started.  HYPERTENSION  -Continue metoprolol. GERD  -Continue Protonix. 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 Myna Hidalgo.  Poor performance status precludes any further chemotherapy. -Status post palliative care meeting with goals of care 11/06/2012. Leukocytosis  -Resolved.  Anemia of chronic disease  -Given 2 units of packed red blood cells on 11/06/2012 by the patient's oncologist. Symptomatically improved. Thrombocytopenia  -Platelets stable. Hypokalemia  -Monitor and replace as needed. Severe protein calorie malnutrition  -Seen by dietician 11/05/12, continue Ensure supplements.  Code Status: DNR/DNI  Family Communication: Updated family at bedside  Disposition Plan: Home when stable, likely and 24-48 hours   Medical Consultants:  Dr. Arlan Organ, Oncology.  Palliative care team  Other Consultants:  Physical therapy: Home health PT.  Anti-infectives:  Vancomycin 11/03/2012---> 11/05/2012  Cefepime 11/03/2012---> 11/05/2012  Flagyl 11/03/2012---> 11/06/2012  Zosyn 11/06/2012--->  HPI/Subjective: [name] feels well today. She is sitting up. She still is having some loose stools. Abdominal pain has improved. No nausea or vomiting.  Objective: Filed Vitals:   11/07/12 0620 11/07/12 1431 11/07/12 2049 11/08/12 0542  BP: 101/61 114/68 112/64 111/72   Pulse: 68 68 70 67  Temp: 99 F (37.2 C) 98.4 F (36.9 C) 98.6 F (37 C) 98.6 F (37 C)  TempSrc: Oral Oral Oral Oral  Resp: 16 16 18 18   Height:      Weight: 65.2 kg (143 lb 11.8 oz)   68.6 kg (151 lb 3.8 oz)  SpO2: 100% 100% 100% 98%    Intake/Output Summary (Last 24 hours) at 11/08/12 1124 Last data filed at 11/08/12 0900  Gross per 24 hour  Intake    980 ml  Output   1001 ml  Net    -21 ml    Exam: Gen:  NAD Cardiovascular:  RRR, No M/R/G Respiratory:  Lungs CTAB Gastrointestinal:  Abdomen soft, nontender, + BS Extremities:  No C/E/C  Data Reviewed: Basic Metabolic Panel:  Recent Labs Lab 11/03/12 1500 11/03/12 2040 11/04/12 0510 11/06/12 0610  NA 135 131* 136 139  K 3.3* 3.7 3.8 3.6  CL 100 100 104 108  CO2 25 24 24 24   GLUCOSE 101* 96 98 81  BUN 15 12 12 12   CREATININE 0.85 0.79 0.85 1.07  CALCIUM 9.1 8.6 9.1 8.6  MG  --  1.7  --   --   PHOS  --  2.5  --   --    GFR Estimated Creatinine Clearance: 52 ml/min (by C-G formula based on Cr of 1.07). Liver Function Tests:  Recent Labs Lab 11/03/12 1500 11/03/12 2040 11/04/12 0510  AST 43* 34 29  ALT 34 29 28  ALKPHOS 133* 121* 119*  BILITOT 1.1 1.0 1.0  PROT 7.2 6.3 6.2  ALBUMIN 2.8* 2.4* 2.5*   Coagulation profile  Recent Labs Lab 11/03/12 1500 11/03/12 2040  INR 1.29 1.41    CBC:  Recent Labs Lab 11/03/12 1500 11/03/12 2040 11/04/12 0510 11/05/12 1035 11/06/12 0610 11/07/12 0510  WBC 11.1* 9.3 8.8 9.9 6.5 6.7  NEUTROABS 9.4* 7.5  --   --   --   --   HGB 9.6* 8.3* 8.9* 9.7* 8.4* 10.6*  HCT 28.4* 25.1* 26.3* 29.1* 25.7* 31.2*  MCV 93.1 93.3 93.9 94.5 94.8 90.2  PLT 80* 68* 70* 108* 93* 98*   CBG:  Recent Labs Lab 11/04/12 1709 11/05/12 0756 11/06/12 0803 11/07/12 0809 11/08/12 0742  GLUCAP 106* 85 69* 81 76   Microbiology Recent Results (from the past 240 hour(s))  CULTURE, BLOOD (ROUTINE X 2)     Status: None   Collection Time    11/03/12  3:00 PM      Result  Value Range Status   Specimen Description BLOOD RIGHT PORT   Final   Special Requests BOTTLES DRAWN AEROBIC ONLY   Final   Culture  Setup Time 11/03/2012 22:56   Final   Culture     Final   Value:        BLOOD CULTURE RECEIVED NO GROWTH TO DATE CULTURE WILL BE HELD FOR 5 DAYS BEFORE ISSUING A FINAL NEGATIVE REPORT   Report Status PENDING   Incomplete  CULTURE, BLOOD (ROUTINE X 2)     Status: None   Collection Time    11/03/12  3:08 PM      Result Value Range Status   Specimen Description BLOOD LEFT ARM   Final   Special Requests BOTTLES DRAWN AEROBIC AND ANAEROBIC   Final   Culture  Setup Time 11/03/2012 22:57   Final   Culture     Final   Value:        BLOOD CULTURE RECEIVED  NO GROWTH TO DATE CULTURE WILL BE HELD FOR 5 DAYS BEFORE ISSUING A FINAL NEGATIVE REPORT   Report Status PENDING   Incomplete  URINE CULTURE     Status: None   Collection Time    11/03/12  3:22 PM      Result Value Range Status   Specimen Description URINE, CLEAN CATCH   Final   Special Requests NONE   Final   Culture  Setup Time 11/04/2012 02:23   Final   Colony Count NO GROWTH   Final   Culture NO GROWTH   Final   Report Status 11/04/2012 FINAL   Final  URINE CULTURE     Status: None   Collection Time    11/04/12  7:35 AM      Result Value Range Status   Specimen Description URINE, RANDOM   Final   Special Requests NONE   Final   Culture  Setup Time 11/04/2012 16:40   Final   Colony Count NO GROWTH   Final   Culture NO GROWTH   Final   Report Status 11/05/2012 FINAL   Final  CLOSTRIDIUM DIFFICILE BY PCR     Status: Abnormal   Collection Time    11/07/12  7:49 PM      Result Value Range Status   C difficile by pcr POSITIVE (*) NEGATIVE Final   Comment: CRITICAL RESULT CALLED TO, READ BACK BY AND VERIFIED WITH:     PADDEN S.,RN 11/08/12 0907 BY JONESJ     Procedures and Diagnostic Studies:  Ct Angio Chest Pe W/cm &/or Wo Cm 11/03/2012  IMPRESSION:  1.  No CT findings for pulmonary  embolism. 2.  Mild fusiform enlargement of the ascending aorta.  No focal aneurysm or dissection. 3.  Enlargement of the main pulmonary artery suggesting pulmonary hypertension. 4.  Extensive coronary artery calcifications, advanced for age. 5.  Progressive pulmonary metastatic disease. 6.  Stable right pleural effusion with overlying atelectasis. Original Report Authenticated By: Rudie Meyer, M.D.     Ct Abdomen Pelvis W Contrast 11/03/2012  IMPRESSION:  1. Slight decrease in size of the subdiaphragmatic fluid collection with air, likely a contracting abscess. 2.  Stable surgical changes involving the liver with ill-defined low attenuation material surrounding the portal vein and portal structures. 3.  No other significant abdominal/pelvic findings.   Original Report Authenticated By: Rudie Meyer, M.D.     Dg Abd Acute W/chest 11/03/2012  IMPRESSION: Scattered small pulmonary nodules/opacities, corresponding to metastases on CT.  Small right pleural effusion.  Lucency beneath the medial right lung base, favored to reflect gas within a subdiaphragmatic abscess when correlating with CT.  No evidence of bowel obstruction or free air.   Original Report Authenticated By: Charline Bills, M.D.     Scheduled Meds: . B-complex with vitamin C  1 tablet Oral Daily  . ciprofloxacin  500 mg Oral BID  . docusate sodium  200 mg Oral Daily  . feeding supplement  237 mL Oral BID BM  . gabapentin  300 mg Oral TID  . metoprolol succinate  100 mg Oral q morning - 10a  . metroNIDAZOLE  500 mg Oral Q8H  . mirtazapine  15 mg Oral QHS  . pantoprazole  40 mg Oral Daily  . polyethylene glycol  17 g Oral Daily   Continuous Infusions:   Time spent: 25 minutes.   LOS: 5 days   [name]  [facility] Pager [phone].  If 8PM-8AM, please contact night-coverage at [email or web address], password [facility]  11/08/2012, 11:24 AM

## 2012-11-09 DIAGNOSIS — A0472 Enterocolitis due to Clostridium difficile, not specified as recurrent: Secondary | ICD-10-CM

## 2012-11-09 LAB — COMPREHENSIVE METABOLIC PANEL
Alkaline Phosphatase: 82 U/L (ref 39–117)
BUN: 10 mg/dL (ref 6–23)
CO2: 25 mEq/L (ref 19–32)
Chloride: 107 mEq/L (ref 96–112)
GFR calc Af Amer: 66 mL/min — ABNORMAL LOW (ref >90)
GFR calc non Af Amer: 57 mL/min — ABNORMAL LOW (ref >90)
Glucose, Bld: 88 mg/dL (ref 70–99)
Potassium: 3.5 mEq/L (ref 3.5–5.1)
Total Bilirubin: 0.3 mg/dL (ref 0.3–1.2)

## 2012-11-09 LAB — CULTURE, BLOOD (ROUTINE X 2): Culture: NO GROWTH

## 2012-11-09 LAB — CBC
HCT: 30.3 % — ABNORMAL LOW (ref 36.0–46.0)
Hemoglobin: 10.1 g/dL — ABNORMAL LOW (ref 12.0–15.0)
WBC: 6.6 10*3/uL (ref 4.0–10.5)

## 2012-11-09 LAB — GLUCOSE, CAPILLARY: Glucose-Capillary: 79 mg/dL (ref 70–99)

## 2012-11-09 MED ORDER — ALUM & MAG HYDROXIDE-SIMETH 200-200-20 MG/5ML PO SUSP: 30.0000 mL | ORAL | Status: DC | PRN

## 2012-11-09 NOTE — Progress Notes (Signed)
[facility] PROGRESS NOTE  GLORISTINE TURRUBIATES QMV:[id number] DOB: [birth year] DOA: 11/03/2012 PCP: Genella Mech, MD  Brief narrative: [name] is an 62 y.o. 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  She also has a PMH of metastasis to lungs (was on weekly Abraxane and per oncology is not considered to be a further candidate for systemic chemo, last chemo in February, 2014), dyslipidemia, GERD and depression who presented to [facility] ED 11/03/2012 with worsening epigastric abdominal pain over past few weeks prior to this admission associated with nausea and vomiting and poor oral intake. In ED, evaluation included CT abdomen/pelvis which revealed persistent but smaller subdiaphragmatic abscess on the right just posterior to the liver, 5 x 5 cm. CT angio chest ruled out pulmonary embolism but it did show progressive pulmonary metastatic disease and stable right pleural effusion. CBC revealed mild leukocytosis of 11.1, hemoglobin of 9.6 (which is patient's baseline) and platelet count of 80 (which is lower than patient's baseline with normal platelet count). Patient was started on vanco, cefepime and flagyl in ED.  The palliative care team saw the patient on 11/06/2012 to establish goals of care with plan to discharge home when stable with hospice. Barrier to discharge is now secondary to the development of Clostridium difficile diarrhea.  Assessment/Plan: Principal Problem:  Fever and abdominal pain / Liver abscess -Lkely from liver abscess noted on CT abdomen. No obvious source of infection in lungs and urinalysis was unremarkable.  -Lactic acid was WNL. Procalcitonin level only mildly elevated 0.4.  -Apparently has been  evaluated by interventional radiology for consideration of CT-guided aspiration, but this has not been recommended.   -Asked for formal ID consultation to evaluate most effective treatment option. Aspiration of abscess not currently recommended. It is recommended, however, that the patient be sent home on antibiotic therapy. Final recommendations are for an additional 20 days of oral Cipro (to be completed 11/28/2012). Active Problems: Generalized weakness -Secondary to advanced malignancy. Maximize nutrition. Clostridium difficile diarrhea  -Patient has developed diarrhea and Clostridium difficile PCR studies were positive. -No leukocytosis or elevation of creatinine which makes this a mild case. Flagyl started 2 week course recommended (to be completed 11/21/2012).  HYPERTENSION  -Continue metoprolol. GERD  -Continue Protonix. 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 Myna Hidalgo.  Poor performance status precludes any further chemotherapy. -Status post palliative care meeting with goals of care 11/06/2012. Leukocytosis  -Resolved.  Anemia of chronic disease  -Given 2 units of packed red blood cells on 11/06/2012 by the patient's oncologist. Symptomatically improved. Thrombocytopenia  -Platelets stable. Hypokalemia  -Monitor and replace as needed. Severe protein calorie malnutrition  -Seen by dietician 11/05/12, continue Ensure supplements.  Code Status: DNR/DNI  Family Communication: No family at bedside today, updated yesterday. Disposition Plan: Home when stable, likely in the next 24-48 hours.   Medical Consultants:  Dr. Arlan Organ, Oncology.  Lorinda Creed, NP, Palliative care team  Dr. Johny Sax, ID.  Other Consultants:  Physical therapy: Home health PT.  Anti-infectives:  Vancomycin 11/03/2012---> 11/05/2012  Cefepime 11/03/2012---> 11/05/2012  Flagyl 11/03/2012---> 11/06/2012  Zosyn 11/06/2012--->  HPI/Subjective: [name] feels weak  today. She is still having loose stools (3 yesterday, none so far today). Occasional abdominal  pain. Decreased by mouth intake. No nausea or vomiting. Objective: Filed Vitals:   11/08/12 1400 11/08/12 2100 11/09/12 0554 11/09/12 1430  BP: 113/76 91/60 106/62 108/67  Pulse: 66 71 70 73  Temp: 98.9 F (37.2 C) 99.7 F (37.6 C) 98.9 F (37.2 C) 98.8 F (37.1 C)  TempSrc: Oral Oral Oral Oral  Resp: 14 18 20 18   Height:      Weight:   68.9 kg (151 lb 14.4 oz)   SpO2: 100% 100% 100% 100%    Intake/Output Summary (Last 24 hours) at 11/09/12 1528 Last data filed at 11/09/12 1300  Gross per 24 hour  Intake    840 ml  Output   1600 ml  Net   -760 ml    Exam: Gen:  NAD Cardiovascular:  RRR, No M/R/G Respiratory:  Lungs CTAB Gastrointestinal:  Abdomen soft, nontender, + BS Extremities:  No C/E/C  Data Reviewed: Basic Metabolic Panel:  Recent Labs Lab 11/03/12 1500 11/03/12 2040 11/04/12 0510 11/06/12 0610 11/09/12 0615  NA 135 131* 136 139 138  K 3.3* 3.7 3.8 3.6 3.5  CL 100 100 104 108 107  CO2 25 24 24 24 25   GLUCOSE 101* 96 98 81 88  BUN 15 12 12 12 10   CREATININE 0.85 0.79 0.85 1.07 1.05  CALCIUM 9.1 8.6 9.1 8.6 8.3*  MG  --  1.7  --   --   --   PHOS  --  2.5  --   --   --    GFR Estimated Creatinine Clearance: 53.1 ml/min (by C-G formula based on Cr of 1.05). Liver Function Tests:  Recent Labs Lab 11/03/12 1500 11/03/12 2040 11/04/12 0510 11/09/12 0615  AST 43* 34 29 12  ALT 34 29 28 8   ALKPHOS 133* 121* 119* 82  BILITOT 1.1 1.0 1.0 0.3  PROT 7.2 6.3 6.2 5.5*  ALBUMIN 2.8* 2.4* 2.5* 2.1*   Coagulation profile  Recent Labs Lab 11/03/12 1500 11/03/12 2040  INR 1.29 1.41    CBC:  Recent Labs Lab 11/03/12 1500 11/03/12 2040 11/04/12 0510 11/05/12 1035 11/06/12 0610 11/07/12 0510 11/09/12 0615  WBC 11.1* 9.3 8.8 9.9 6.5 6.7 6.6  NEUTROABS 9.4* 7.5  --   --   --   --   --   HGB 9.6* 8.3* 8.9* 9.7* 8.4* 10.6* 10.1*  HCT 28.4* 25.1* 26.3*  29.1* 25.7* 31.2* 30.3*  MCV 93.1 93.3 93.9 94.5 94.8 90.2 92.7  PLT 80* 68* 70* 108* 93* 98* 91*   CBG:  Recent Labs Lab 11/05/12 0756 11/06/12 0803 11/07/12 0809 11/08/12 0742 11/09/12 0747  GLUCAP 85 69* 81 76 79   Microbiology Recent Results (from the past 240 hour(s))  CULTURE, BLOOD (ROUTINE X 2)     Status: None   Collection Time    11/03/12  3:00 PM      Result Value Range Status   Specimen Description BLOOD RIGHT PORT   Final   Special Requests BOTTLES DRAWN AEROBIC ONLY   Final   Culture  Setup Time 11/03/2012 22:56   Final   Culture NO GROWTH 5 DAYS   Final   Report Status 11/09/2012 FINAL   Final  CULTURE, BLOOD (ROUTINE X 2)     Status: None   Collection Time    11/03/12  3:08 PM      Result Value Range Status   Specimen Description BLOOD LEFT ARM   Final   Special Requests BOTTLES DRAWN AEROBIC AND ANAEROBIC    Final   Culture  Setup Time 11/03/2012 22:57   Final   Culture NO GROWTH 5 DAYS   Final   Report Status 11/09/2012 FINAL   Final  URINE CULTURE     Status: None   Collection Time    11/03/12  3:22 PM      Result Value Range Status   Specimen Description URINE, CLEAN CATCH   Final   Special Requests NONE   Final   Culture  Setup Time 11/04/2012 02:23   Final   Colony Count NO GROWTH   Final   Culture NO GROWTH   Final   Report Status 11/04/2012 FINAL   Final  URINE CULTURE     Status: None   Collection Time    11/04/12  7:35 AM      Result Value Range Status   Specimen Description URINE, RANDOM   Final   Special Requests NONE   Final   Culture  Setup Time 11/04/2012 16:40   Final   Colony Count NO GROWTH   Final   Culture NO GROWTH   Final   Report Status 11/05/2012 FINAL   Final  CLOSTRIDIUM DIFFICILE BY PCR     Status: Abnormal   Collection Time    11/07/12  7:49 PM      Result Value Range Status   C difficile by pcr POSITIVE (*) NEGATIVE Final   Comment: CRITICAL RESULT CALLED TO, READ BACK BY AND VERIFIED WITH:     PADDEN  S.,RN 11/08/12 0907 BY JONESJ     Procedures and Diagnostic Studies:  Ct Angio Chest Pe W/cm &/or Wo Cm 11/03/2012  IMPRESSION:  1.  No CT findings for pulmonary embolism. 2.  Mild fusiform enlargement of the ascending aorta.  No focal aneurysm or dissection. 3.  Enlargement of the main pulmonary artery suggesting pulmonary hypertension. 4.  Extensive coronary artery calcifications, advanced for age. 5.  Progressive pulmonary metastatic disease. 6.  Stable right pleural effusion with overlying atelectasis. Original Report Authenticated By: Rudie Meyer, M.D.     Ct Abdomen Pelvis W Contrast 11/03/2012  IMPRESSION:  1. Slight decrease in size of the subdiaphragmatic fluid collection with air, likely a contracting abscess. 2.  Stable surgical changes involving the liver with ill-defined low attenuation material surrounding the portal vein and portal structures. 3.  No other significant abdominal/pelvic findings.   Original Report Authenticated By: Rudie Meyer, M.D.     Dg Abd Acute W/chest 11/03/2012  IMPRESSION: Scattered small pulmonary nodules/opacities, corresponding to metastases on CT.  Small right pleural effusion.  Lucency beneath the medial right lung base, favored to reflect gas within a subdiaphragmatic abscess when correlating with CT.  No evidence of bowel obstruction or free air.   Original Report Authenticated By: Charline Bills, M.D.     Scheduled Meds: . B-complex with vitamin C  1 tablet Oral Daily  . ciprofloxacin  500 mg Oral BID  . feeding supplement  237 mL Oral BID BM  . gabapentin  300 mg Oral TID  . metoprolol succinate  100 mg Oral q morning - 10a  . metroNIDAZOLE  500 mg Oral Q8H  . mirtazapine  15 mg Oral QHS   Continuous Infusions:   Time spent: 25 minutes.   LOS: 6 days   Mckena Chern  [facility] Pager [phone].  If 8PM-8AM, please contact night-coverage at [email or web address], password [facility] 11/09/2012, 3:28 PM

## 2012-11-09 NOTE — Progress Notes (Signed)
Now with C. difficile colitis. Is on Flagyl for this. Is feeling fairly tired. It is still quite weak. Does have some diarrhea. Not taking a lot in orally but this is no surprise.  There is no bleeding. There is no vomiting. She's had no fever.  Vital signs are pretty stable. Blood pressure 106/62. Heart rate 70. Abdomen is not distended. Bowel sounds are actually slightly decreased. There is no guarding or rebound tenderness. There is no fluid wave. There may be some slight tenderness in the right upper quadrant. Lungs are clear. Cardiac exam regular rate and rhythm.  She will continue to be managed as an inpatient. Hopefully, once the diarrhea begins to resolve, that she might be oh to go home with hospice help.  Pete E.  [place]brew[place] 12:12

## 2012-11-09 NOTE — Progress Notes (Signed)
INFECTIOUS DISEASE PROGRESS NOTE  ID: [name] is a 62 y.o. 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 No BM today, 3 loose BM yesterday.   Abtx:  Anti-infectives   Start     Dose/Rate Route Frequency Ordered Stop   11/08/12 1200  ciprofloxacin (CIPRO) tablet 500 mg     500 mg Oral 2 times daily 11/08/12 1124     11/08/12 1000  metroNIDAZOLE (FLAGYL) tablet 500 mg     500 mg Oral 3 times per day 11/08/12 0914 11/22/12 1359   11/06/12 1400  piperacillin-tazobactam (ZOSYN) IVPB 3.375 g  Status:  Discontinued     3.375 g 12.5 mL/hr over 240 Minutes Intravenous Every 8 hours 11/06/12 1307 11/08/12 1124   11/04/12 0600  vancomycin (VANCOCIN) 750 mg in sodium chloride 0.9 % 150 mL IVPB  Status:  Discontinued     750 mg 150 mL/hr over 60 Minutes Intravenous Every 12 hours 11/03/12 1837 11/05/12 1406   11/03/12 2200  ceFEPIme (MAXIPIME) 1 g in dextrose 5 % 50 mL IVPB  Status:  Discontinued     1 g 100 mL/hr over 30 Minutes Intravenous 3 times per day 11/03/12 1838 11/05/12 1406   11/03/12 1830  metroNIDAZOLE (FLAGYL) IVPB 500 mg  Status:  Discontinued     500 mg 100 mL/hr over 60 Minutes Intravenous Every 8 hours 11/03/12 1822 11/06/12 1233   11/03/12 1515  ceFEPIme (MAXIPIME) 1 g in dextrose 5 % 50 mL IVPB     1 g 100 mL/hr over 30 Minutes Intravenous  Once 11/03/12 1508 11/03/12 1617   11/03/12 1500  vancomycin (VANCOCIN) IVPB 1000 mg/200 mL premix     1,000 mg 200 mL/hr over 60 Minutes Intravenous  Once 11/03/12 1451 11/03/12 1948   11/03/12 1500  metroNIDAZOLE (FLAGYL) IVPB 500 mg     500 mg 100 mL/hr over 60 Minutes Intravenous  Once  11/03/12 1451 11/03/12 1809      Medications:  Scheduled: . B-complex with vitamin C  1 tablet Oral Daily  . ciprofloxacin  500 mg Oral BID  . feeding supplement  237 mL Oral BID BM  . gabapentin  300 mg Oral TID  . metoprolol succinate  100 mg Oral q morning - 10a  . metroNIDAZOLE  500 mg Oral Q8H  . mirtazapine  15 mg Oral QHS    Objective: Vital signs in last 24 hours: Temp:  [98.9 F (37.2 C)-99.7 F (37.6 C)] 98.9 F (37.2 C) (03/31 0554) Pulse Rate:  [70-71] 70 (03/31 0554) Resp:  [18-20] 20 (03/31 0554) BP: (91-106)/(60-62) 106/62 mmHg (03/31 0554) SpO2:  [100 %] 100 % (03/31 0554) Weight:  [68.9 kg (151 lb 14.4 oz)] 68.9 kg (151 lb 14.4 oz) (03/31 0554)   General appearance: alert, cooperative and no distress Resp: clear to auscultation bilaterally Cardio: regular rate and rhythm GI: normal findings: bowel sounds normal and soft, non-tender  Lab Results  Recent Labs  11/07/12 0510 11/09/12 0615  WBC 6.7 6.6  HGB 10.6* 10.1*  HCT 31.2* 30.3*  NA  --  138  K  --  3.5  CL  --  107  CO2  --  25  BUN  --  10  CREATININE  --  1.05   Liver Panel  Recent Labs  11/09/12 0615  PROT 5.5*  ALBUMIN 2.1*  AST 12  ALT 8  ALKPHOS 82  BILITOT 0.3   Sedimentation Rate No results found for this basename: ESRSEDRATE,  in the last 72 hours C-Reactive Protein No results found for this basename: CRP,  in the last 72 hours  Microbiology: Recent Results (from the past 240 hour(s))  CULTURE, BLOOD (ROUTINE X 2)     Status: None   Collection Time    11/03/12  3:00 PM      Result Value Range Status   Specimen Description BLOOD RIGHT PORT   Final   Special Requests BOTTLES DRAWN AEROBIC ONLY   Final   Culture  Setup Time 11/03/2012 22:56   Final   Culture NO GROWTH 5 DAYS   Final   Report Status 11/09/2012 FINAL   Final  CULTURE, BLOOD (ROUTINE X 2)     Status: None   Collection Time    11/03/12  3:08 PM      Result Value Range Status   Specimen  Description BLOOD LEFT ARM   Final   Special Requests BOTTLES DRAWN AEROBIC AND ANAEROBIC   Final   Culture  Setup Time 11/03/2012 22:57   Final   Culture NO GROWTH 5 DAYS   Final   Report Status 11/09/2012 FINAL   Final  URINE CULTURE     Status: None   Collection Time    11/03/12  3:22 PM      Result Value Range Status   Specimen Description URINE, CLEAN CATCH   Final   Special Requests NONE   Final   Culture  Setup Time 11/04/2012 02:23   Final   Colony Count NO GROWTH   Final   Culture NO GROWTH   Final   Report Status 11/04/2012 FINAL   Final  URINE CULTURE     Status: None   Collection Time    11/04/12  7:35 AM      Result Value Range Status   Specimen Description URINE, RANDOM   Final   Special Requests NONE   Final   Culture  Setup Time 11/04/2012 16:40   Final   Colony Count NO GROWTH   Final   Culture NO GROWTH   Final   Report Status 11/05/2012 FINAL   Final  CLOSTRIDIUM DIFFICILE BY PCR     Status: Abnormal   Collection Time    11/07/12  7:49 PM      Result Value Range Status   C difficile by pcr POSITIVE (*) NEGATIVE Final   Comment: CRITICAL RESULT CALLED TO, READ BACK BY AND VERIFIED WITH:     PADDEN S.,RN 11/08/12 0907 BY JONESJ    Studies/Results: No results found.   Assessment/Plan: Liver Abscess Cholangiocarcinoma C diff  Vancomycin 11/03/2012---> 11/05/2012  Cefepime 11/03/2012---> 11/05/2012  Flagyl 11/03/2012---> 11/06/2012   11/08/2012 ---> Zosyn 11/06/2012---> 11/08/12 Cipro 11/08/12  Would- Transition to Cipro for 20 more days Continue flagyl for 14 days total (start day count yesterday) Available if questions  Johny Sax Infectious Diseases [phone] 11/09/2012, 2:15 PM   LOS: 6 days

## 2012-11-09 NOTE — Progress Notes (Signed)
Physical Therapy Treatment Patient Details Name: [name] MRN: [id number] DOB: Jun 22, 1951 Today's Date: 11/09/2012 Time: 4098-1191 PT Time Calculation (min): 19 min  PT Assessment / Plan / Recommendation Comments on Treatment Session  Pt progressing very well and hoped to go home in the next couple of days. Pt states she has a walker at home and is agreeable to [facility] PT.    Follow Up Recommendations  Home health PT     Does the patient have the potential to tolerate intense rehabilitation     Barriers to Discharge        Equipment Recommendations  None recommended by PT    Recommendations for Other Services    Frequency Min 3X/week   Plan Discharge plan remains appropriate;Frequency remains appropriate    Precautions / Restrictions Precautions Precautions: Fall Restrictions Weight Bearing Restrictions: No   Pertinent Vitals/Pain C/o mild R side pain    Mobility  Bed Mobility Bed Mobility: Supine to Sit Supine to Sit: 6: Modified independent (Device/Increase time) Details for Bed Mobility Assistance: Pt pulled off her own covers, reached down and adjust own socks and get OOB with MinGuard/supervision  assist Transfers Transfers: Sit to Stand;Stand to Sit Sit to Stand: 5: Supervision;4: Min guard Stand to Sit: 5: Supervision;4: Min guard Details for Transfer Assistance: good use of hands and safety cognition Ambulation/Gait Ambulation/Gait Assistance: 5: Supervision;4: Min guard Ambulation Distance (Feet): 500 Feet Assistive device: Rolling walker Ambulation/Gait Assistance Details: prior at home pt used a RW and when asked she wanted to amb with the RW "I feel safer". Pt amb full unit with no LOB and demon good alternating gait. Gait Pattern: Step-through pattern;Decreased stride length;Narrow base of support Gait velocity: WFL    PT Goals                                                        progressing    Visit Information  Last PT Received On:  11/09/12 Assistance Needed: +1    Subjective Data  Subjective: I feel a whole lot better Patient Stated Goal: home   Cognition    good   Balance   good  End of Session PT - End of Session Equipment Utilized During Treatment: Gait belt Activity Tolerance: Patient tolerated treatment well Patient left: in bed;with call bell/phone within reach;with family/visitor present   Felecia Shelling  PTA [facility]  Acute  Rehab Pager      [phone]

## 2012-11-09 NOTE — Progress Notes (Signed)
Spoke with pt, husband and family at bedside concerning Hospice. Pt selected [facility] and Palliative Care. Referral given to in house rep with [facility].

## 2012-11-09 NOTE — Progress Notes (Addendum)
Notified by M. Pearson, CMRN, patient and family request services of [facility]) after discharge.   Patient information reviewed with Dr Elliot Gurney, [facility] Medical Director hospice eligible with dx: Cholangiocarcinoma.  Spoke with patient, husband Marilu Favre and daughter and son at bedside to initiate education related to hospice services, philosophy and team approach to care. Family voiced good understanding of information provided; family shared that they were appreciative of, and very familiar with, [facility] services as Ms Foulkes worked with [facility] in the care of her mother 4 years ago Per discussion with family plan is to d/c in next day or two - per husband/ patient plan is to transport by personal vehicle to home  DME needs were discussed patient currently has a cane a home; DME requested is Complete Package D: fully electric hospital bed with half rails; AP&P mattress pad and overbed table and add a 3n1 BSC  *Please call husband Marilu Favre c: [phone] to arrange delivery  Initial paperwork faxed to [facility] Referral Center  Please notify [facility] when patient is ready to leave unit at d/c call [phone] (or [phone] if after 5 pm);  [facility] information and contact numbers also given to husband during visit.   Above information shared with M. Pearson, CMRN and a voice message left for [place], [place] [facility] representative Please call with any questions or concerns    Valente David, RN 11/09/2012, 3:26 PM [facility] Palliative Medicine Team RN Liaison [phone]

## 2012-11-10 NOTE — Care Management Note (Signed)
    Page 1 of 2   11/10/2012     2:15:31 PM   CARE MANAGEMENT NOTE 11/10/2012  Patient:  [name], [name]   Account Number:  [id number]  Date Initiated:  11/07/2012  Documentation initiated by:  [name],Sherry  Subjective/Objective Assessment:   62 yo female admitted with fever. PTA pt independent with a large family support system.     Action/Plan:   Home with Hospice Services   Anticipated DC Date:     Anticipated DC Plan:  HOME W HOSPICE CARE  In-house referral  Hospice / Palliative Care      DC Planning Services  CM consult      [facility] Choice  HOSPICE   Choice offered to / List presented to:  C-1 Patient           HH agency  [facility] [place]   Status of service:  In process, will continue to follow Medicare Important Message given?   (If response is "NO", the following Medicare IM given date fields will be blank) Date Medicare IM given:   Date Additional Medicare IM given:    Discharge Disposition:    Per UR Regulation:    If discussed at Long Length of Stay Meetings, dates discussed:   11/10/2012    Comments:  11/10/2012 Colleen Can BSN RN CCM [phone]. Plan is for patient to go home with hospice services. Currently pt on contact precautions for c dif. CM will follow.   11/07/12 1532 Sherry Davis,RN,BSN [phone] Cm spoke with patient with adult daughter and two daughter-n-laws present at bedside concerning Cm consult for Home Hospice choice. Cm provided pt with list of Home Hospice agencies in [place]. No choice made at this time. Pt states has DME for home use but request BSC. Pt informed once choice of agency made, agency rep will discuss equipment packages with pt. No other needs stated at this time.

## 2012-11-10 NOTE — Progress Notes (Signed)
[facility] PROGRESS NOTE  NELIA ROGOFF ZOX:[id number] DOB: Jul 29, 1951 DOA: 11/03/2012 PCP: Genella Mech, MD  Brief narrative: [name] is an 62 y.o. female with a PMH of metastatic cholangiocarcinoma status post partial right hepatectomy done at [facility] with subsequent request made for CT guided aspiration of the surgical bed done per interventional radiology on 09/30/2012 secondary to concerns for abscess, but there was not felt to be enough fluid to drain at that time.  She also has a PMH of metastasis to lungs (was on weekly Abraxane and per oncology is not considered to be a further candidate for systemic chemo, last chemo in February, 2014), dyslipidemia, GERD and depression who presented to [facility] ED 11/03/2012 with worsening epigastric abdominal pain over past few weeks prior to this admission associated with nausea and vomiting and poor oral intake. In ED, evaluation included CT abdomen/pelvis which revealed persistent but smaller subdiaphragmatic abscess on the right just posterior to the liver, 5 x 5 cm. CT angio chest ruled out pulmonary embolism but it did show progressive pulmonary metastatic disease and stable right pleural effusion. CBC revealed mild leukocytosis of 11.1, hemoglobin of 9.6 (which is patient's baseline) and platelet count of 80 (which is lower than patient's baseline with normal platelet count). Patient was started on vanco, cefepime and flagyl in ED.  The palliative care team saw the patient on 11/06/2012 to establish goals of care with plan to discharge home when stable with hospice. Barrier to discharge is now secondary to the development of Clostridium difficile diarrhea.  Assessment/Plan: Principal Problem:  Fever and abdominal pain / Liver abscess -Lkely from liver abscess noted on CT abdomen. No obvious source of infection in lungs and urinalysis was unremarkable.  -Lactic acid was WNL. Procalcitonin level only mildly elevated 0.4.  -Apparently has been  evaluated by interventional radiology for consideration of CT-guided aspiration, but this has not been recommended.   -Asked for formal ID consultation to evaluate most effective treatment option. Aspiration of abscess not currently recommended. It is recommended, however, that the patient be sent home on antibiotic therapy. Final recommendations are for an additional 19 days of oral Cipro (to be completed 11/28/2012). Active Problems: Generalized weakness -Secondary to advanced malignancy and ongoing infections. Maximize nutrition. Clostridium difficile diarrhea  -Patient has developed diarrhea and Clostridium difficile PCR studies were positive. -No leukocytosis or elevation of creatinine which makes this a mild case. Flagyl started 2 week course recommended (to be completed 11/21/2012).  HYPERTENSION  -Continue metoprolol. GERD  -Continue Protonix. 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 Myna Hidalgo.  Poor performance status precludes any further chemotherapy. -Status post palliative care meeting with goals of care 11/06/2012. Leukocytosis  -Resolved.  Anemia of chronic disease  -Given 2 units of packed red blood cells on 11/06/2012 by the patient's oncologist. Symptomatically improved. Thrombocytopenia  -Platelets stable. Hypokalemia  -Monitor and replace as needed. Severe protein calorie malnutrition  -Seen by dietician 11/05/12, continue Ensure supplements.  Code Status: DNR/DNI  Family Communication: No family at bedside today, updated yesterday. Disposition Plan: Home when stable, likely in the next 24-48 hours.   Medical Consultants:  Dr. Arlan Organ, Oncology.  Lorinda Creed, NP, Palliative care team  Dr. Johny Sax, ID.  Other Consultants:  Physical therapy: Home health PT.  Anti-infectives:  Vancomycin 11/03/2012---> 11/05/2012  Cefepime 11/03/2012---> 11/05/2012  Flagyl 11/03/2012---> 11/06/2012  Zosyn 11/06/2012--->  HPI/Subjective: [name] is still having some loose stools. No nausea or vomiting. Appetite is fair. Anxious  to be discharged. Objective: Filed Vitals:   11/09/12 0554 11/09/12 1430 11/09/12 2226 11/10/12 0630  BP: 106/62 108/67 102/56 112/74  Pulse: 70 73 71 66  Temp: 98.9 F (37.2 C) 98.8 F (37.1 C) 99.1 F (37.3 C) 98.4 F (36.9 C)  TempSrc: Oral Oral Oral Oral  Resp: 20 18 20 16   Height:      Weight: 68.9 kg (151 lb 14.4 oz)   69.1 kg (152 lb 5.4 oz)  SpO2: 100% 100% 100% 100%    Intake/Output Summary (Last 24 hours) at 11/10/12 1139 Last data filed at 11/10/12 0900  Gross per 24 hour  Intake    720 ml  Output    400 ml  Net    320 ml    Exam: Gen:  NAD Cardiovascular:  RRR, No M/R/G Respiratory:  Lungs CTAB Gastrointestinal:  Abdomen soft, nontender, + BS Extremities:  No C/E/C  Data Reviewed: Basic Metabolic Panel:  Recent Labs Lab 11/03/12 1500 11/03/12 2040 11/04/12 0510 11/06/12 0610 11/09/12 0615  NA 135 131* 136 139 138  K 3.3* 3.7 3.8 3.6 3.5  CL 100 100 104 108 107  CO2 25 24 24 24 25   GLUCOSE 101* 96 98 81 88  BUN 15 12 12 12 10   CREATININE 0.85 0.79 0.85 1.07 1.05  CALCIUM 9.1 8.6 9.1 8.6 8.3*  MG  --  1.7  --   --   --   PHOS  --  2.5  --   --   --    GFR Estimated Creatinine Clearance: 53.2 ml/min (by C-G formula based on Cr of 1.05). Liver Function Tests:  Recent Labs Lab 11/03/12 1500 11/03/12 2040 11/04/12 0510 11/09/12 0615  AST 43* 34 29 12  ALT 34 29 28 8   ALKPHOS 133* 121* 119* 82  BILITOT 1.1 1.0 1.0 0.3  PROT 7.2 6.3 6.2 5.5*  ALBUMIN 2.8* 2.4* 2.5* 2.1*   Coagulation profile  Recent Labs Lab 11/03/12 1500 11/03/12 2040  INR 1.29 1.41    CBC:  Recent Labs Lab 11/03/12 1500 11/03/12 2040 11/04/12 0510 11/05/12 1035 11/06/12 0610 11/07/12 0510 11/09/12 0615  WBC 11.1* 9.3 8.8 9.9 6.5 6.7 6.6  NEUTROABS 9.4* 7.5  --   --   --   --   --   HGB 9.6* 8.3* 8.9* 9.7* 8.4* 10.6* 10.1*  HCT 28.4* 25.1* 26.3* 29.1* 25.7*  31.2* 30.3*  MCV 93.1 93.3 93.9 94.5 94.8 90.2 92.7  PLT 80* 68* 70* 108* 93* 98* 91*   CBG:  Recent Labs Lab 11/06/12 0803 11/07/12 0809 11/08/12 0742 11/09/12 0747 11/10/12 0734  GLUCAP 69* 81 76 79 71   Microbiology Recent Results (from the past 240 hour(s))  CULTURE, BLOOD (ROUTINE X 2)     Status: None   Collection Time    11/03/12  3:00 PM      Result Value Range Status   Specimen Description BLOOD RIGHT PORT   Final   Special Requests BOTTLES DRAWN AEROBIC ONLY   Final   Culture  Setup Time 11/03/2012 22:56   Final   Culture NO GROWTH 5 DAYS   Final   Report Status 11/09/2012 FINAL   Final  CULTURE, BLOOD (ROUTINE X 2)     Status: None   Collection Time    11/03/12  3:08 PM      Result Value Range Status   Specimen Description BLOOD LEFT ARM   Final   Special Requests BOTTLES DRAWN AEROBIC AND  ANAEROBIC   Final   Culture  Setup Time 11/03/2012 22:57   Final   Culture NO GROWTH 5 DAYS   Final   Report Status 11/09/2012 FINAL   Final  URINE CULTURE     Status: None   Collection Time    11/03/12  3:22 PM      Result Value Range Status   Specimen Description URINE, CLEAN CATCH   Final   Special Requests NONE   Final   Culture  Setup Time 11/04/2012 02:23   Final   Colony Count NO GROWTH   Final   Culture NO GROWTH   Final   Report Status 11/04/2012 FINAL   Final  URINE CULTURE     Status: None   Collection Time    11/04/12  7:35 AM      Result Value Range Status   Specimen Description URINE, RANDOM   Final   Special Requests NONE   Final   Culture  Setup Time 11/04/2012 16:40   Final   Colony Count NO GROWTH   Final   Culture NO GROWTH   Final   Report Status 11/05/2012 FINAL   Final  CLOSTRIDIUM DIFFICILE BY PCR     Status: Abnormal   Collection Time    11/07/12  7:49 PM      Result Value Range Status   C difficile by pcr POSITIVE (*) NEGATIVE Final   Comment: CRITICAL RESULT CALLED TO, READ BACK BY AND VERIFIED WITH:     PADDEN S.,RN 11/08/12  0907 BY JONESJ     Procedures and Diagnostic Studies:  Ct Angio Chest Pe W/cm &/or Wo Cm 11/03/2012  IMPRESSION:  1.  No CT findings for pulmonary embolism. 2.  Mild fusiform enlargement of the ascending aorta.  No focal aneurysm or dissection. 3.  Enlargement of the main pulmonary artery suggesting pulmonary hypertension. 4.  Extensive coronary artery calcifications, advanced for age. 5.  Progressive pulmonary metastatic disease. 6.  Stable right pleural effusion with overlying atelectasis. Original Report Authenticated By: Rudie Meyer, M.D.     Ct Abdomen Pelvis W Contrast 11/03/2012  IMPRESSION:  1. Slight decrease in size of the subdiaphragmatic fluid collection with air, likely a contracting abscess. 2.  Stable surgical changes involving the liver with ill-defined low attenuation material surrounding the portal vein and portal structures. 3.  No other significant abdominal/pelvic findings.   Original Report Authenticated By: Rudie Meyer, M.D.     Dg Abd Acute W/chest 11/03/2012  IMPRESSION: Scattered small pulmonary nodules/opacities, corresponding to metastases on CT.  Small right pleural effusion.  Lucency beneath the medial right lung base, favored to reflect gas within a subdiaphragmatic abscess when correlating with CT.  No evidence of bowel obstruction or free air.   Original Report Authenticated By: Charline Bills, M.D.     Scheduled Meds: . B-complex with vitamin C  1 tablet Oral Daily  . ciprofloxacin  500 mg Oral BID  . feeding supplement  237 mL Oral BID BM  . gabapentin  300 mg Oral TID  . metoprolol succinate  100 mg Oral q morning - 10a  . metroNIDAZOLE  500 mg Oral Q8H  . mirtazapine  15 mg Oral QHS   Continuous Infusions:   Time spent: 25 minutes.   LOS: 7 days   RAMA,CHRISTINA  [facility] Pager [phone].  If 8PM-8AM, please contact night-coverage at [email or web address], password [facility] 11/10/2012, 11:39 AM

## 2012-11-11 ENCOUNTER — Encounter (HOSPITAL_COMMUNITY): Payer: Self-pay | Admitting: Internal Medicine

## 2012-11-11 ENCOUNTER — Encounter: Payer: Self-pay | Admitting: *Deleted

## 2012-11-11 DIAGNOSIS — K750 Abscess of liver: Secondary | ICD-10-CM | POA: Diagnosis present

## 2012-11-11 LAB — GLUCOSE, CAPILLARY: Glucose-Capillary: 89 mg/dL (ref 70–99)

## 2012-11-11 MED ORDER — MORPHINE SULFATE 15 MG PO TABS: 15.0000 mg | ORAL_TABLET | Freq: Four times a day (QID) | ORAL | Status: DC | PRN

## 2012-11-11 MED ORDER — METRONIDAZOLE 500 MG PO TABS: 500.0000 mg | ORAL_TABLET | Freq: Three times a day (TID) | ORAL | Status: AC

## 2012-11-11 MED ORDER — HEPARIN SOD (PORK) LOCK FLUSH 100 UNIT/ML IV SOLN
INTRAVENOUS | Status: AC
  Administered 2012-11-11: 11:00:00
  Filled 2012-11-11: qty 5

## 2012-11-11 MED ORDER — CIPROFLOXACIN HCL 500 MG PO TABS: 500.0000 mg | ORAL_TABLET | Freq: Two times a day (BID) | ORAL | Status: AC

## 2012-11-11 NOTE — Progress Notes (Signed)
[name] is looking like her old self. She sounds a lot stronger. She just looks more capable.  She's not had diarrhea this this morning. She's had her last diarrhea was yesterday morning. I think she's on Flagyl.  Hopefully she can go home today. Hospice has already been back to her house. They provided some equipment.  She's not hurting. There is no cough. She's not noticed any obvious bleeding.  On her physical exam, vital signs temperature is a simple 69 spot rate 16 blood pressure 111/68. Lungs are clear bilaterally. Cardiac exam regular rate and rhythm with no murmurs or rubs or bruits. Abdominal exam is not distended. She has decent bowel sounds. There is no fluid wave. There is no palpable hepatospleno megaly. Extremities shows some trace edema in her legs.  Again, hopefully she will be oh to go home today. In my opinion, I think she will be able to go home today. She is looking forward to this. She wants be able to go outside enjoy the nice spring weather.   Hewitt Shorts  Psalm 147:3

## 2012-11-11 NOTE — Discharge Summary (Signed)
Physician Discharge Summary  [name] RUE:[id number] DOB: [birth year] DOA: 11/03/2012  PCP: Genella Mech, MD  Admit date: 11/03/2012 Discharge date: 11/11/2012  Recommendations for Outpatient Follow-up:  1. The patient is being discharged home with hospice care.  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  Diet recommendation: Regular.  History of present illness:  [name] is an 62 y.o. female with a PMH of metastatic cholangiocarcinoma status post partial right hepatectomy done at [facility] with subsequent request made for CT guided aspiration of the surgical bed done per interventional radiology on 09/30/2012 secondary to concerns for abscess, but there was not felt to be enough fluid to drain at that time. She also has a PMH of metastasis to lungs (was on weekly Abraxane and per oncology is not considered to be a further candidate for systemic chemo, last chemo in February, 2014), dyslipidemia, GERD and depression who presented to [facility] ED 11/03/2012 with worsening epigastric abdominal pain over past few weeks prior to this admission associated with nausea and vomiting and poor oral intake. In ED, evaluation included CT abdomen/pelvis which revealed persistent but smaller subdiaphragmatic abscess on the right just posterior to the liver, 5 x 5 cm. CT angio chest ruled out pulmonary embolism but it did show progressive pulmonary metastatic disease and stable right pleural effusion.   Hospital Course by problem:  Principal Problem:   Fever and abdominal pain / Liver abscess  -Lkely from liver abscess noted on CT abdomen. No obvious source of infection in lungs and urinalysis was unremarkable. Clostridium difficile turned up positive during the course of her hospital stay, which may have contributed to her fever as well. -Lactic acid was WNL. Procalcitonin level only mildly elevated 0.4.  -Apparently has been evaluated by interventional radiology for consideration of CT-guided aspiration, but this has not been recommended.  -Asked for formal ID consultation to evaluate most effective treatment option. Aspiration of abscess not currently recommended. It is recommended, however, that the patient be sent home on antibiotic therapy.  -Discharge home on an additional 18 days of therapy with oral Cipro. Flagyl for an additional 12 days for treatment of Clostridium difficile. Active Problems:  Generalized weakness  -Secondary to advanced malignancy. Maximize nutrition.  Clostridium difficile diarrhea  -Patient has developed diarrhea and Clostridium difficile PCR studies were positive.  -No leukocytosis or elevation of creatinine which makes this a mild case. Flagyl started, which she will continue to take for the next 12 days.  HYPERTENSION  -Continue metoprolol.  GERD  -Protonix discontinued secondary to Clostridium difficile.  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 Myna Hidalgo. Poor performance status precludes any further chemotherapy.  -Status post palliative care meeting with goals of care 11/06/2012.  Leukocytosis  -Resolved.  Anemia of chronic disease  -Given 2 units of packed red blood cells on 11/06/2012 by the patient's oncologist. Symptomatically improved.  Thrombocytopenia  -Platelets stable.  Hypokalemia  -Monitor and replace as needed.  Severe protein calorie malnutrition  -Seen by dietician 11/05/12,  continue Ensure supplements.   Procedures:  None.  Medical Consultants:  Dr. Arlan Organ, Oncology.  Palliative care team   Discharge Exam: Filed Vitals:   11/11/12 0555  BP: 111/68  Pulse: 69  Temp: 97.7 F (36.5 C)  Resp: 16   Filed Vitals:   11/10/12 0630 11/10/12 1419 11/10/12 2235 11/11/12 0555  BP: 112/74 98/65 125/85 111/68  Pulse: 66 71 77 69  Temp: 98.4 F (36.9 C) 98.3 F (36.8 C) 99.6 F (37.6 C) 97.7 F (36.5 C)  TempSrc: Oral Oral Oral Oral  Resp: 16 18 20 16   Height:      Weight: 69.1 kg (152 lb 5.4 oz)   70.1 kg (154 lb 8.7 oz)  SpO2: 100% 98% 100% 100%    Gen:  NAD Cardiovascular:  RRR, No M/R/G Respiratory: Lungs CTAB Gastrointestinal: Abdomen soft, NT/ND with normal active bowel sounds. Extremities: No C/E/C   Discharge Instructions  Discharge Orders   Future Appointments Provider Department Dept Phone   01/01/2013 2:15 PM Judie Bonus, MD [facility] [phone]   Future Orders Complete By Expires     Call MD for:  persistant nausea and vomiting  As directed     Call MD for:  severe uncontrolled pain  As directed     Call MD for:  temperature >100.4  As directed     Call MD for:  As directed     Scheduling Instructions:      Worsening diarrhea.    Diet general  As directed     Increase activity slowly  As directed         Medication List    STOP taking these medications       docusate sodium 100 MG capsule  Commonly known as:  COLACE     omeprazole 20 MG capsule  Commonly known as:  PRILOSEC     polyethylene glycol packet  Commonly known as:  MIRALAX / GLYCOLAX      TAKE these medications       Alpha Lipoic Acid 200 MG Caps  Take 200 mg by mouth daily.     ciprofloxacin 500 MG tablet  Commonly known as:  CIPRO  Take 1 tablet (500 mg total) by mouth 2 (two) times daily.     gabapentin 300 MG capsule  Commonly known as:  NEURONTIN  Take 300 mg by mouth 3 (three) times daily.     lidocaine-prilocaine cream  Commonly known as:  EMLA  Apply 1 application topically daily  as needed (apply to port site).     LORazepam 0.5 MG tablet  Commonly known as:  ATIVAN  Take 0.5 mg by mouth every 8 (eight) hours as needed (for nausea).     metoprolol succinate 100 MG 24 hr tablet  Commonly known as:  TOPROL-XL  Take 100 mg by mouth every morning. Take with or immediately following a meal.     metroNIDAZOLE 500 MG tablet  Commonly known as:  FLAGYL  Take 1 tablet (500 mg total) by mouth every 8 (eight) hours.     mirtazapine 15 MG tablet  Commonly known as:  REMERON  Take 15 mg by mouth at bedtime.     morphine 15 MG tablet  Commonly known as:  MSIR  Take 1 tablet (15 mg total) by mouth every 6 (six) hours as needed for pain.     Vitamin-B Complex Tabs  Take by mouth every morning.  Follow-up Information   Follow up with [facility] ([facility] to follow aftr d/c-pls notify [facility] when pt ready to leave unit-call [phone](or [phone] aftr 5pm))    Contact information:   [facility] [address] [place], [place] [phone]       The results of significant diagnostics from this hospitalization (including imaging, microbiology, ancillary and laboratory) are listed below for reference.    Significant Diagnostic Studies: Ct Angio Chest Pe W/cm &/or Wo Cm 11/03/2012 IMPRESSION: 1. No CT findings for pulmonary embolism. 2. Mild fusiform enlargement of the ascending aorta. No focal aneurysm or dissection. 3. Enlargement of the main pulmonary artery suggesting pulmonary hypertension. 4. Extensive coronary artery calcifications, advanced for age. 5. Progressive pulmonary metastatic disease. 6. Stable right pleural effusion with overlying atelectasis. Original Report Authenticated By: Rudie Meyer, M.D.  Ct Abdomen Pelvis W Contrast 11/03/2012 IMPRESSION: 1. Slight decrease in size of the subdiaphragmatic fluid collection with air, likely a contracting abscess. 2. Stable surgical changes involving the liver with ill-defined  low attenuation material surrounding the portal vein and portal structures. 3. No other significant abdominal/pelvic findings. Original Report Authenticated By: Rudie Meyer, M.D.  Dg Abd Acute W/chest 11/03/2012 IMPRESSION: Scattered small pulmonary nodules/opacities, corresponding to metastases on CT. Small right pleural effusion. Lucency beneath the medial right lung base, favored to reflect gas within a subdiaphragmatic abscess when correlating with CT. No evidence of bowel obstruction or free air. Original Report Authenticated By: Charline Bills, M.D.    Labs:  Basic Metabolic Panel:  Recent Labs Lab 11/06/12 0610 11/09/12 0615  NA 139 138  K 3.6 3.5  CL 108 107  CO2 24 25  GLUCOSE 81 88  BUN 12 10  CREATININE 1.07 1.05  CALCIUM 8.6 8.3*   GFR Estimated Creatinine Clearance: 53.5 ml/min (by C-G formula based on Cr of 1.05). Liver Function Tests:  Recent Labs Lab 11/09/12 0615  AST 12  ALT 8  ALKPHOS 82  BILITOT 0.3  PROT 5.5*  ALBUMIN 2.1*   CBC:  Recent Labs Lab 11/05/12 1035 11/06/12 0610 11/07/12 0510 11/09/12 0615  WBC 9.9 6.5 6.7 6.6  HGB 9.7* 8.4* 10.6* 10.1*  HCT 29.1* 25.7* 31.2* 30.3*  MCV 94.5 94.8 90.2 92.7  PLT 108* 93* 98* 91*   CBG:  Recent Labs Lab 11/07/12 0809 11/08/12 0742 11/09/12 0747 11/10/12 0734 11/11/12 0723  GLUCAP 81 76 79 71 89   Microbiology Recent Results (from the past 240 hour(s))  CULTURE, BLOOD (ROUTINE X 2)     Status: None   Collection Time    11/03/12  3:00 PM      Result Value Range Status   Specimen Description BLOOD RIGHT PORT   Final   Special Requests BOTTLES DRAWN AEROBIC ONLY   Final   Culture  Setup Time 11/03/2012 22:56   Final   Culture NO GROWTH 5 DAYS   Final   Report Status 11/09/2012 FINAL   Final  CULTURE, BLOOD (ROUTINE X 2)     Status: None   Collection Time    11/03/12  3:08 PM      Result Value Range Status   Specimen Description BLOOD LEFT ARM   Final   Special Requests  BOTTLES DRAWN AEROBIC AND ANAEROBIC   Final   Culture  Setup Time 11/03/2012 22:57   Final   Culture NO GROWTH 5 DAYS   Final   Report Status 11/09/2012 FINAL   Final  URINE CULTURE  Status: None   Collection Time    11/03/12  3:22 PM      Result Value Range Status   Specimen Description URINE, CLEAN CATCH   Final   Special Requests NONE   Final   Culture  Setup Time 11/04/2012 02:23   Final   Colony Count NO GROWTH   Final   Culture NO GROWTH   Final   Report Status 11/04/2012 FINAL   Final  URINE CULTURE     Status: None   Collection Time    11/04/12  7:35 AM      Result Value Range Status   Specimen Description URINE, RANDOM   Final   Special Requests NONE   Final   Culture  Setup Time 11/04/2012 16:40   Final   Colony Count NO GROWTH   Final   Culture NO GROWTH   Final   Report Status 11/05/2012 FINAL   Final  CLOSTRIDIUM DIFFICILE BY PCR     Status: Abnormal   Collection Time    11/07/12  7:49 PM      Result Value Range Status   C difficile by pcr POSITIVE (*) NEGATIVE Final   Comment: CRITICAL RESULT CALLED TO, READ BACK BY AND VERIFIED WITH:     PADDEN S.,RN 11/08/12 0907 BY JONESJ    Time coordinating discharge: 35 minutes.  Signed:  Nathifa Ritthaler  Pager [phone] [facility] 11/11/2012, 10:10 AM

## 2012-11-15 NOTE — Consult Note (Signed)
I have reviewed and discussed the care of this patient in detail with the nurse practitioner including pertinent patient records, physical exam findings and data. I agree with details of this encounter.  

## 2012-11-18 ENCOUNTER — Other Ambulatory Visit: Payer: Self-pay | Admitting: *Deleted

## 2012-11-18 DIAGNOSIS — C221 Intrahepatic bile duct carcinoma: Secondary | ICD-10-CM

## 2012-11-18 DIAGNOSIS — G893 Neoplasm related pain (acute) (chronic): Secondary | ICD-10-CM

## 2012-11-18 MED ORDER — MORPHINE SULFATE ER 15 MG PO TBCR: 15.0000 mg | EXTENDED_RELEASE_TABLET | Freq: Two times a day (BID) | ORAL | Status: DC

## 2012-11-18 NOTE — Telephone Encounter (Signed)
Toniann Fail, RN from Hospice called. Pt is taking MSIR 15 mg TID but it isn't holding her the full 8 hours. Reviewed with Dr Myna Hidalgo. To change to MS Contin 15 mg TID and continue with the MSIR as needed. Will fax rx to [facility].

## 2012-12-09 ENCOUNTER — Other Ambulatory Visit: Payer: Self-pay | Admitting: *Deleted

## 2012-12-09 DIAGNOSIS — C221 Intrahepatic bile duct carcinoma: Secondary | ICD-10-CM

## 2012-12-09 DIAGNOSIS — G893 Neoplasm related pain (acute) (chronic): Secondary | ICD-10-CM

## 2012-12-09 MED ORDER — MORPHINE SULFATE 15 MG PO TABS: 15.0000 mg | ORAL_TABLET | Freq: Four times a day (QID) | ORAL | Status: AC | PRN

## 2012-12-09 MED ORDER — MORPHINE SULFATE ER 15 MG PO TBCR: 15.0000 mg | EXTENDED_RELEASE_TABLET | Freq: Two times a day (BID) | ORAL | Status: DC

## 2012-12-09 NOTE — Telephone Encounter (Signed)
Toniann Fail, RN from Hospice called. She was at the pt's house for her weekly visit and wanted to have her pain meds refilled to ensure she didn't run out. Has been taking MS Contin and MSIR with "good pain control". Will route to Eunice Blase, PA-C in Dr Gustavo Lah absence for aprroval & signature then fax to [facility].

## 2012-12-24 NOTE — Progress Notes (Signed)
Late entry.  Dr. Myna Hidalgo ordered a hand held nebulizer for pt because of her chest congestion difficulty breathing  and tightness of lungs.  This was done and pt taken to ER to be admitted.

## 2012-12-30 ENCOUNTER — Other Ambulatory Visit: Payer: Self-pay | Admitting: *Deleted

## 2012-12-30 DIAGNOSIS — C221 Intrahepatic bile duct carcinoma: Secondary | ICD-10-CM

## 2012-12-30 DIAGNOSIS — G893 Neoplasm related pain (acute) (chronic): Secondary | ICD-10-CM

## 2012-12-30 MED ORDER — MORPHINE SULFATE ER 30 MG PO TBCR: 30.0000 mg | EXTENDED_RELEASE_TABLET | Freq: Two times a day (BID) | ORAL | Status: AC

## 2012-12-30 MED ORDER — LORAZEPAM 0.5 MG PO TABS: 0.5000 mg | ORAL_TABLET | Freq: Three times a day (TID) | ORAL | Status: AC | PRN

## 2012-12-30 NOTE — Telephone Encounter (Signed)
Received a call from [place], RN from [facility]. Pt needs an increase in her MS Contin

## 2013-01-01 ENCOUNTER — Encounter: Payer: Medicare Other | Admitting: Internal Medicine

## 2013-01-05 ENCOUNTER — Other Ambulatory Visit: Payer: Self-pay | Admitting: *Deleted

## 2013-01-05 DIAGNOSIS — C221 Intrahepatic bile duct carcinoma: Secondary | ICD-10-CM

## 2013-01-05 MED ORDER — MORPHINE SULFATE (CONCENTRATE) 20 MG/ML PO SOLN: 5.0000 mg | ORAL | Status: AC | PRN

## 2013-01-05 NOTE — Telephone Encounter (Signed)
Received a call from Fleming from [facility] [place]. The pt is no longer able to swallow and needs to be changed over from MS Contin to Roxanol. Will route to Eunice Blase, [place] for approval & signature then fax to [facility] [address] [facility] [address] as requested.

## 2013-01-12 ENCOUNTER — Encounter: Payer: Self-pay | Admitting: *Deleted

## 2013-01-12 NOTE — Progress Notes (Signed)
Late note.  On 08/13/12 pt in office for MD visit.  CXR showes pneunomia.  Pt SOB.  Per Dr. Myna Hidalgo, [facility] given to patient with some relief from SOB.

## 2013-02-09 DEATH — deceased

## 2014-04-07 ENCOUNTER — Other Ambulatory Visit: Payer: Self-pay | Admitting: *Deleted

## 2015-04-12 ENCOUNTER — Telehealth: Payer: Self-pay | Admitting: *Deleted

## 2015-04-12 NOTE — Telephone Encounter (Signed)
Patient's daughter calling office because death certificate filled out ~35yr ago was filled out with wrong diagnosis. 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 Daughter needs this changed per her attorney's recommendations. [employer] [employer] [place] and they provided instructions on how to file a supplement to the death certificate with the correct diagnosis. Will fill out paperwork with correct diagnosis and file with [place] [employer] [employer]. Daughter, MJackquline Bosch aware of plan.
